# Patient Record
Sex: Female | Born: 1965 | Race: White | Hispanic: No | Marital: Married | Smoking: Former smoker
Health system: Southern US, Community
[De-identification: ages and names within clinical notes are randomized; demographics above are authoritative.]

## PROBLEM LIST (undated history)

## (undated) DIAGNOSIS — Z87898 Personal history of other specified conditions: Secondary | ICD-10-CM

## (undated) DIAGNOSIS — I700 Atherosclerosis of aorta: Secondary | ICD-10-CM

## (undated) DIAGNOSIS — K760 Fatty (change of) liver, not elsewhere classified: Secondary | ICD-10-CM

## (undated) DIAGNOSIS — K5732 Diverticulitis of large intestine without perforation or abscess without bleeding: Secondary | ICD-10-CM

## (undated) DIAGNOSIS — K5750 Diverticulosis of both small and large intestine without perforation or abscess without bleeding: Secondary | ICD-10-CM

## (undated) DIAGNOSIS — K5010 Crohn's disease of large intestine without complications: Secondary | ICD-10-CM

## (undated) DIAGNOSIS — N200 Calculus of kidney: Secondary | ICD-10-CM

## (undated) DIAGNOSIS — K639 Disease of intestine, unspecified: Secondary | ICD-10-CM

## (undated) DIAGNOSIS — K222 Esophageal obstruction: Secondary | ICD-10-CM

## (undated) DIAGNOSIS — I2510 Atherosclerotic heart disease of native coronary artery without angina pectoris: Secondary | ICD-10-CM

## (undated) DIAGNOSIS — E8809 Other disorders of plasma-protein metabolism, not elsewhere classified: Secondary | ICD-10-CM

## (undated) DIAGNOSIS — I10 Essential (primary) hypertension: Secondary | ICD-10-CM

## (undated) DIAGNOSIS — J90 Pleural effusion, not elsewhere classified: Secondary | ICD-10-CM

## (undated) HISTORY — PX: COLONOSCOPY: SHX174

## (undated) HISTORY — DX: Disease of intestine, unspecified: K63.9

## (undated) HISTORY — DX: Atherosclerosis of aorta: I70.0

## (undated) HISTORY — DX: Personal history of other specified conditions: Z87.898

## (undated) HISTORY — DX: Fatty (change of) liver, not elsewhere classified: K76.0

## (undated) HISTORY — DX: Esophageal obstruction: K22.2

## (undated) HISTORY — DX: Crohn's disease of large intestine without complications: K50.10

## (undated) HISTORY — DX: Other disorders of plasma-protein metabolism, not elsewhere classified: E88.09

## (undated) HISTORY — DX: Atherosclerotic heart disease of native coronary artery without angina pectoris: I25.10

## (undated) HISTORY — DX: Calculus of kidney: N20.0

## (undated) HISTORY — DX: Pleural effusion, not elsewhere classified: J90

---

## 1997-07-12 ENCOUNTER — Other Ambulatory Visit: Admission: RE | Admit: 1997-07-12 | Discharge: 1997-07-12 | Payer: Self-pay | Admitting: *Deleted

## 1997-08-04 ENCOUNTER — Other Ambulatory Visit: Admission: RE | Admit: 1997-08-04 | Discharge: 1997-08-04 | Payer: Self-pay | Admitting: *Deleted

## 2000-03-01 ENCOUNTER — Other Ambulatory Visit: Admission: RE | Admit: 2000-03-01 | Discharge: 2000-03-01 | Payer: Self-pay | Admitting: *Deleted

## 2000-04-02 HISTORY — PX: HEMORRHOID SURGERY: SHX153

## 2002-05-08 ENCOUNTER — Emergency Department (HOSPITAL_COMMUNITY): Admission: EM | Admit: 2002-05-08 | Discharge: 2002-05-08 | Payer: Self-pay | Admitting: Emergency Medicine

## 2002-06-25 ENCOUNTER — Other Ambulatory Visit: Admission: RE | Admit: 2002-06-25 | Discharge: 2002-06-25 | Payer: Self-pay | Admitting: *Deleted

## 2004-01-21 ENCOUNTER — Other Ambulatory Visit: Admission: RE | Admit: 2004-01-21 | Discharge: 2004-01-21 | Payer: Self-pay | Admitting: Obstetrics and Gynecology

## 2005-01-29 ENCOUNTER — Ambulatory Visit (HOSPITAL_BASED_OUTPATIENT_CLINIC_OR_DEPARTMENT_OTHER): Admission: RE | Admit: 2005-01-29 | Discharge: 2005-01-29 | Payer: Self-pay

## 2005-01-29 ENCOUNTER — Ambulatory Visit (HOSPITAL_COMMUNITY): Admission: RE | Admit: 2005-01-29 | Discharge: 2005-01-29 | Payer: Self-pay

## 2005-05-02 ENCOUNTER — Other Ambulatory Visit: Admission: RE | Admit: 2005-05-02 | Discharge: 2005-05-02 | Payer: Self-pay | Admitting: Obstetrics and Gynecology

## 2006-09-09 ENCOUNTER — Encounter: Admission: RE | Admit: 2006-09-09 | Discharge: 2006-09-09 | Payer: Self-pay | Admitting: Obstetrics and Gynecology

## 2010-04-23 ENCOUNTER — Encounter: Payer: Self-pay | Admitting: Obstetrics and Gynecology

## 2010-07-13 ENCOUNTER — Other Ambulatory Visit (HOSPITAL_COMMUNITY): Payer: Self-pay | Admitting: Obstetrics and Gynecology

## 2010-07-14 ENCOUNTER — Other Ambulatory Visit (HOSPITAL_COMMUNITY): Payer: Self-pay | Admitting: Obstetrics and Gynecology

## 2010-07-14 DIAGNOSIS — R1032 Left lower quadrant pain: Secondary | ICD-10-CM

## 2010-07-19 ENCOUNTER — Ambulatory Visit (HOSPITAL_COMMUNITY)
Admission: RE | Admit: 2010-07-19 | Discharge: 2010-07-19 | Disposition: A | Discharge status: Home / Self Care | Payer: Private Health Insurance - Indemnity | Source: Ambulatory Visit | Attending: Obstetrics and Gynecology | Admitting: Obstetrics and Gynecology

## 2010-07-19 DIAGNOSIS — K7689 Other specified diseases of liver: Secondary | ICD-10-CM | POA: Insufficient documentation

## 2010-07-19 DIAGNOSIS — K5732 Diverticulitis of large intestine without perforation or abscess without bleeding: Secondary | ICD-10-CM | POA: Insufficient documentation

## 2010-07-19 DIAGNOSIS — K630 Abscess of intestine: Secondary | ICD-10-CM | POA: Insufficient documentation

## 2010-07-19 DIAGNOSIS — R1032 Left lower quadrant pain: Secondary | ICD-10-CM | POA: Insufficient documentation

## 2010-07-19 DIAGNOSIS — K921 Melena: Secondary | ICD-10-CM | POA: Insufficient documentation

## 2010-07-19 MED ORDER — IOHEXOL 300 MG/ML  SOLN
100.0000 mL | Freq: Once | INTRAMUSCULAR | Status: AC | PRN
  Administered 2010-07-19: 100 mL via INTRAVENOUS

## 2010-07-25 ENCOUNTER — Other Ambulatory Visit (HOSPITAL_COMMUNITY): Payer: Self-pay | Admitting: Gastroenterology

## 2010-07-25 DIAGNOSIS — K5792 Diverticulitis of intestine, part unspecified, without perforation or abscess without bleeding: Secondary | ICD-10-CM

## 2010-07-31 ENCOUNTER — Ambulatory Visit (HOSPITAL_COMMUNITY)
Admission: RE | Admit: 2010-07-31 | Discharge: 2010-07-31 | Disposition: A | Discharge status: Home / Self Care | Payer: Private Health Insurance - Indemnity | Source: Ambulatory Visit | Attending: Gastroenterology | Admitting: Gastroenterology

## 2010-07-31 DIAGNOSIS — K639 Disease of intestine, unspecified: Secondary | ICD-10-CM | POA: Insufficient documentation

## 2010-07-31 DIAGNOSIS — K5792 Diverticulitis of intestine, part unspecified, without perforation or abscess without bleeding: Secondary | ICD-10-CM

## 2010-07-31 DIAGNOSIS — K5732 Diverticulitis of large intestine without perforation or abscess without bleeding: Secondary | ICD-10-CM | POA: Insufficient documentation

## 2010-07-31 MED ORDER — IOHEXOL 300 MG/ML  SOLN
100.0000 mL | Freq: Once | INTRAMUSCULAR | Status: AC | PRN
  Administered 2010-07-31: 100 mL via INTRAVENOUS

## 2010-08-18 NOTE — Op Note (Signed)
NAMELOYALTY, ARENTZ               ACCOUNT NO.:  [id number]   MEDICAL RECORD NO.:  [id number]          PATIENT TYPE:  AMB   LOCATION:  [facility]                         FACILITY:  [facility]   PHYSICIAN:  Georgina Quint, M.D.   DATE OF BIRTH:  1965/11/20   DATE OF PROCEDURE:  01/29/2005  DATE OF DISCHARGE:                                 OPERATIVE REPORT   PREOPERATIVE DIAGNOSIS:  Chronic draining abscess of the perineum.   POSTOPERATIVE DIAGNOSIS:  Anal fistula.   OPERATION:  Excision of anal fistula.   SURGEON:  Dr. Deon Pilling.   ANESTHESIA:  General and local.   PROCEDURE:  After the patient was monitored and had general anesthesia and  routine preparation and draping of the perineal area, I probed the area of  pus drainage in the right anterior aspect of the buttock.  Probe went in for  a couple of centimeters, but I could not get it to go down to the rectum or  anus at that point.  I cut out the skin scar in that area and saw a good  deal of chronic granulation tissue and felt that this most likely  represented an anal fistula.  I put in an anoscope and saw no definite  internal opening but gently probed in the direction of the anus with a  fistula probe and it did indeed go into a crypt at the upper end of the anus  on the right anterior aspect.  I could only traverse a small amount of  external sphincter, so I felt the best method would be open treatment.  I  completely opened the fistulous tract using cautery and trimmed away  overhanging skin so as to provide good open drainage.  I noted a good rim of  persisting external sphincter above the muscle all the way around.  I then  cauterized the area thoroughly and scraped away the chronic granulation  tissue and thoroughly anesthetized the site with long-acting local  anesthetic.  I applied a packing and a small bandage.  The patient tolerated  the operation well.      Georgina Quint, M.D.  Electronically Signed     WB/MEDQ  D:   01/29/2005  T:  01/29/2005  Job:  [id number]

## 2011-06-28 ENCOUNTER — Encounter (HOSPITAL_COMMUNITY): Payer: Self-pay | Admitting: Emergency Medicine

## 2011-06-28 ENCOUNTER — Emergency Department (HOSPITAL_COMMUNITY)
Admission: EM | Admit: 2011-06-28 | Discharge: 2011-06-28 | Disposition: A | Discharge status: Home Health | Payer: Private Health Insurance - Indemnity | Attending: Emergency Medicine | Admitting: Emergency Medicine

## 2011-06-28 ENCOUNTER — Emergency Department (HOSPITAL_COMMUNITY): Payer: Private Health Insurance - Indemnity

## 2011-06-28 DIAGNOSIS — M25569 Pain in unspecified knee: Secondary | ICD-10-CM | POA: Insufficient documentation

## 2011-06-28 DIAGNOSIS — M25469 Effusion, unspecified knee: Secondary | ICD-10-CM | POA: Insufficient documentation

## 2011-06-28 MED ORDER — NAPROXEN 500 MG PO TABS: 500.0000 mg | ORAL_TABLET | Freq: Two times a day (BID) | ORAL | Status: DC

## 2011-06-28 MED ORDER — HYDROCODONE-ACETAMINOPHEN 5-325 MG PO TABS: ORAL_TABLET | ORAL | Status: DC

## 2011-06-28 MED ORDER — HYDROMORPHONE HCL PF 1 MG/ML IJ SOLN
1.0000 mg | Freq: Once | INTRAMUSCULAR | Status: AC
  Administered 2011-06-28: 1 mg via INTRAVENOUS

## 2011-06-28 MED ORDER — ONDANSETRON HCL 4 MG/2ML IJ SOLN
4.0000 mg | Freq: Once | INTRAMUSCULAR | Status: AC
  Administered 2011-06-28: 4 mg via INTRAVENOUS
  Filled 2011-06-28: qty 2

## 2011-06-28 MED ORDER — HYDROMORPHONE HCL PF 1 MG/ML IJ SOLN
0.5000 mg | Freq: Once | INTRAMUSCULAR | Status: AC
  Administered 2011-06-28: 0.5 mg via INTRAVENOUS
  Filled 2011-06-28 (×2): qty 1

## 2011-06-28 MED ORDER — TRAMADOL HCL 50 MG PO TABS: 50.0000 mg | ORAL_TABLET | Freq: Four times a day (QID) | ORAL | Status: AC | PRN

## 2011-06-28 NOTE — ED Provider Notes (Signed)
Patient cannot take Lortab because of itching. Hydrocodone prescription is canceled and she is given a prescription for tramadol.  Delora Fuel, MD 34/03/70 9643

## 2011-06-28 NOTE — ED Notes (Signed)
Pt discharged by T. Soyars, Therapist, sports

## 2011-06-28 NOTE — ED Provider Notes (Signed)
History     CSN: [id number]  Arrival date & time 06/28/11  1717   First MD Initiated Contact with Patient 06/28/11 1808      Chief Complaint  Patient presents with  . Knee Pain    (Consider location/radiation/quality/duration/timing/severity/associated sxs/prior treatment) HPI Comments: Patient presents with acute onset of right knee pain that began after she stood up after sitting up on the side of the bed. Pain was severe. She denied hearing any clicks or pops. She denies history of knee injuries. She denies falling. Patient is unable to ambulate. Pain is worse with bearing weight and palpation and bending of her knee. No treatments prior to arrival. Nothing makes the pain better.  Patient is a 46 y.o. female presenting with knee pain. The history is provided by the patient.  Knee Pain This is a new problem. The current episode started today. The problem has been unchanged. Associated symptoms include arthralgias. Pertinent negatives include no fever, joint swelling, nausea, neck pain, numbness, vomiting or weakness. The symptoms are aggravated by bending and walking. She has tried nothing for the symptoms.    History reviewed. No pertinent past medical history.  History reviewed. No pertinent past surgical history.  No family history on file.  History  Substance Use Topics  . Smoking status: Former Research scientist (life sciences)  . Smokeless tobacco: Former Systems developer    Quit date: 11/28/2010  . Alcohol Use: No    OB History    Grav Para Term Preterm Abortions TAB SAB Ect Mult Living                  Review of Systems  Constitutional: Negative for fever and activity change.  HENT: Negative for neck pain.   Gastrointestinal: Negative for nausea and vomiting.  Musculoskeletal: Positive for arthralgias. Negative for back pain and joint swelling.  Skin: Negative for wound.  Neurological: Negative for weakness and numbness.    Allergies  Review of patient's allergies indicates no known  allergies.  Home Medications  No current outpatient prescriptions on file.  BP 149/74  Pulse 104  Temp(Src) 98.2 F (36.8 C) (Oral)  Resp 20  Ht 5' 4"  (1.626 m)  Wt 128 lb (58.06 kg)  BMI 21.97 kg/m2  SpO2 100%  Physical Exam  Nursing note and vitals reviewed. Constitutional: She is oriented to person, place, and time. She appears well-developed and well-nourished.  HENT:  Head: Normocephalic and atraumatic.  Eyes: Pupils are equal, round, and reactive to light.  Neck: Normal range of motion. Neck supple.  Cardiovascular: Exam reveals no decreased pulses.   Pulses:      Dorsalis pedis pulses are 2+ on the right side, and 2+ on the left side.       Posterior tibial pulses are 2+ on the right side, and 2+ on the left side.  Musculoskeletal: She exhibits tenderness. She exhibits no edema.       Right hip: Normal.       Right knee: She exhibits swelling. She exhibits normal range of motion and no bony tenderness. tenderness found. Lateral joint line tenderness noted. No medial joint line, no MCL and no LCL tenderness noted.       Right ankle: No proximal fibula tenderness found.       Right upper leg: Normal.       Right lower leg: Normal.  Neurological: She is alert and oriented to person, place, and time. No sensory deficit.       Motor, sensation, and vascular distal  to the injury is fully intact.   Skin: Skin is warm and dry.  Psychiatric: She has a normal mood and affect.    ED Course  Procedures (including critical care time)  Labs Reviewed - No data to display Dg Knee Complete 4 Views Right  06/28/2011  *RADIOLOGY REPORT*  Clinical Data: 46 year old female with posterior right knee pain. No known injury.  RIGHT KNEE - COMPLETE 4+ VIEW  Comparison: None  Findings: No evidence of acute fracture, subluxation or dislocation identified.  No joint effusion noted.  No radio-opaque foreign bodies are present.  No focal bony lesions are noted.  The joint spaces are unremarkable.   Minimal soft tissue prominence overlying the popliteal fossa on the lateral view.  IMPRESSION: No evidence of acute abnormality.  Minimal soft tissue prominence overlying the popliteal fossa.  This may represent normal overlying soft tissue structures but given this patient's symptoms, a popliteal/Baker's cyst is not excluded.  Original Report Authenticated By: Lura Em, M.D.   1. Knee pain    6:35 PM Patient seen and examined. X-ray ordered. Pain medication ordered.   Vital signs reviewed and are as follows: Filed Vitals:   06/28/11 1719  BP: 149/74  Pulse: 104  Temp: 98.2 F (36.8 C)  Resp: 20   7:50 PM X-ray reviewed by myself. Patient informed of results. Patient's pain is improved after IV pain medicines. Discussed the need to rest and use the RICE protocol. Crutches and knee immobilizer given by orthopedic technician. Patient urged to followup with orthopedic doctor for further evaluation of her injury. Referral given. Will discharge to home with pain medication and anti-inflammatories.  Patient counseled on use of narcotic pain medications. Counseled not to combine these medications with others containing tylenol. Urged not to drink alcohol, drive, or perform any other activities that requires focus while taking these medications. The patient verbalizes understanding and agrees with the plan.    MDM  Knee pain after injury. Likely ligamentous injury, less likely mensicus injury given mechanism. Pt will need ortho eval.          Carlisle Cater, PA 06/28/11 1953

## 2011-06-28 NOTE — ED Notes (Signed)
Per EMS< pt from home, reports R knee pain, states she stood up and turned her knee the wrong way

## 2011-06-28 NOTE — ED Notes (Signed)
FRT:MY11<ZN> Expected date:06/28/11<BR> Expected time: 4:57 PM<BR> Means of arrival:Ambulance<BR> Comments:<BR> M32. 46 YO F. KNEE PAION. 20 MINS. STABLE, AMBULATORY,  ?TRIAGE

## 2011-06-28 NOTE — ED Notes (Signed)
Patient transported to X-ray 

## 2011-06-28 NOTE — ED Provider Notes (Signed)
Medical screening examination/treatment/procedure(s) were performed by non-physician practitioner and as supervising physician I was immediately available for consultation/collaboration.   Lezlie Octave, MD 06/28/11 2350

## 2011-06-28 NOTE — Discharge Instructions (Signed)
Please read and follow all provided instructions.  Your diagnoses today include:  1. Knee pain    Tests performed today include:  X-ray of your knee which did not show any fractures or dislocations  Vital signs. See below for your results today.   Medications prescribed:   Vicodin (hydrocodone/acetaminophen) - narcotic pain medication  You have been prescribed narcotic pain medication such as Vicodin or Percocet: DO NOT drive or perform any activities that require you to be awake and alert because this medicine can make you drowsy. BE VERY CAREFUL not to take multiple medicines containing Tylenol (also called acetaminophen). Doing so can lead to an overdose which can damage your liver and cause liver failure and possibly death.    Ibuprofen - anti-inflammatory pain medication  Do not exceed 849m ibuprofen every 8 hours  Naproxen - anti-inflammatory pain medication  Do not exceed 5073mnaproxen every 12 hours  You have been prescribed an anti-inflammatory medication or NSAID. Take with food. Take smallest effective dose for the shortest duration needed for your pain. Stop taking if you experience stomach pain or vomiting.   Take any prescribed medications only as directed.  Home care instructions:  Follow any educational materials contained in this packet.  Use crutches as knee immobilizer as needed for comfort.   BE VERY CAREFUL not to take multiple medicines containing Tylenol (also called acetaminophen). Doing so can lead to an overdose which can damage your liver and cause liver failure and possibly death.   Follow-up instructions: Please follow-up with the orthopedic referral in the next week for further evaluation of your symptoms.   If you do not have a primary care doctor -- see below for referral information.   Return instructions:   Please return to the Emergency Department if you experience worsening symptoms.   Please return if you have any other emergent  concerns.  Additional Information:  Your vital signs today were: BP 149/74  Pulse 104  Temp(Src) 98.2 F (36.8 C) (Oral)  Resp 20  Ht 5' 4"  (1.626 m)  Wt 128 lb (58.06 kg)  BMI 21.97 kg/m2  SpO2 100%  LMP 06/19/2011 If your blood pressure (BP) was elevated above 135/85 this visit, please have this repeated by your doctor within one month. -------------- No Primary Care Doctor Call [facility]  [phone] agencies that provide inexpensive medical care    [facility]  [phone]  [facility]  [phone]  [facility]  37[phone]------------- RESOURCE GUIDE:  Dental Problems  Patients with Medicaid: [facility] [phone]. [place]:  [phone]                                                 Phone:  [phone] unable to  pay or uninsured, contact:  [place] or [facility]. to become qualified for the adult dental clinic.  Chronic Pain Problems Contact [facility]  [phone] Patients need to be referred by their primary care doctor.  Insufficient Money for Medicine Contact [employer]:  call "211" or Edith Endave [phone].  [place]  [phone] [facility]  [place]   [phone] (emergency services [phone])  Substance Abuse Resources [facility]  [phone] [facility] [phone] [facility] [place] [phone] Chinita Pester [phone] Residential & Outpatient Substance Abuse Program  [phone]  Abuse/Neglect [place] [phone] [place] [phone] (After Hours)  Emergency  [place] [phone]  [place] [facility] [phone] [place] [phone]  [facility] Dept. [address]      [place] Phone:  [phone]                                   Phone:  [phone]                 Phone:  [place] Phone:  [place] [phone] (After Hours)

## 2011-08-14 ENCOUNTER — Other Ambulatory Visit: Payer: Self-pay | Admitting: Obstetrics and Gynecology

## 2011-08-14 DIAGNOSIS — R928 Other abnormal and inconclusive findings on diagnostic imaging of breast: Secondary | ICD-10-CM

## 2011-08-16 ENCOUNTER — Ambulatory Visit
Admission: RE | Admit: 2011-08-16 | Discharge: 2011-08-16 | Disposition: A | Discharge status: Home / Self Care | Payer: Self-pay | Source: Ambulatory Visit | Attending: Obstetrics and Gynecology | Admitting: Obstetrics and Gynecology

## 2011-08-16 DIAGNOSIS — R928 Other abnormal and inconclusive findings on diagnostic imaging of breast: Secondary | ICD-10-CM

## 2011-10-25 ENCOUNTER — Other Ambulatory Visit: Payer: Self-pay | Admitting: Family Medicine

## 2011-10-25 ENCOUNTER — Inpatient Hospital Stay (HOSPITAL_COMMUNITY)
Admission: EM | Admit: 2011-10-25 | Discharge: 2011-10-30 | DRG: 392 | Disposition: A | Discharge status: Home / Self Care | Payer: 59 | Attending: General Surgery | Admitting: General Surgery

## 2011-10-25 ENCOUNTER — Other Ambulatory Visit: Payer: Self-pay

## 2011-10-25 ENCOUNTER — Encounter (HOSPITAL_COMMUNITY): Payer: Self-pay | Admitting: Emergency Medicine

## 2011-10-25 ENCOUNTER — Ambulatory Visit
Admission: RE | Admit: 2011-10-25 | Discharge: 2011-10-25 | Disposition: A | Discharge status: Home / Self Care | Payer: 59 | Source: Ambulatory Visit | Attending: Family Medicine | Admitting: Family Medicine

## 2011-10-25 DIAGNOSIS — K5732 Diverticulitis of large intestine without perforation or abscess without bleeding: Principal | ICD-10-CM | POA: Diagnosis present

## 2011-10-25 DIAGNOSIS — R109 Unspecified abdominal pain: Secondary | ICD-10-CM

## 2011-10-25 DIAGNOSIS — K630 Abscess of intestine: Secondary | ICD-10-CM

## 2011-10-25 LAB — URINALYSIS, ROUTINE W REFLEX MICROSCOPIC
Bilirubin Urine: NEGATIVE
Glucose, UA: NEGATIVE mg/dL
Ketones, ur: 15 mg/dL — AB
Leukocytes, UA: NEGATIVE
Nitrite: NEGATIVE
Protein, ur: NEGATIVE mg/dL
Specific Gravity, Urine: <1.005 — ABNORMAL LOW (ref 1.005–1.030)
Urobilinogen, UA: 0.2 mg/dL (ref 0.0–1.0)
pH: 6 (ref 5.0–8.0)

## 2011-10-25 LAB — COMPREHENSIVE METABOLIC PANEL
Albumin: 3.5 g/dL (ref 3.5–5.2)
Alkaline Phosphatase: 83 U/L (ref 39–117)
BUN: <3 mg/dL — ABNORMAL LOW (ref 6–23)
Potassium: 3.4 mEq/L — ABNORMAL LOW (ref 3.5–5.1)
Total Protein: 7.7 g/dL (ref 6.0–8.3)

## 2011-10-25 LAB — CBC WITH DIFFERENTIAL/PLATELET
Basophils Absolute: 0.1 K/uL (ref 0.0–0.1)
Basophils Relative: 1 % (ref 0–1)
Eosinophils Absolute: 0.2 10*3/uL (ref 0.0–0.7)
Eosinophils Relative: 2 % (ref 0–5)
HCT: 44.8 % (ref 36.0–46.0)
Hemoglobin: 15.7 g/dL — ABNORMAL HIGH (ref 12.0–15.0)
Lymphocytes Relative: 17 % (ref 12–46)
Lymphs Abs: 2.1 K/uL (ref 0.7–4.0)
MCH: 35.1 pg — ABNORMAL HIGH (ref 26.0–34.0)
MCHC: 35 g/dL (ref 30.0–36.0)
MCV: 100.2 fL — ABNORMAL HIGH (ref 78.0–100.0)
Monocytes Absolute: 0.6 K/uL (ref 0.1–1.0)
Monocytes Relative: 5 % (ref 3–12)
Neutro Abs: 9.3 K/uL — ABNORMAL HIGH (ref 1.7–7.7)
Neutrophils Relative %: 75 % (ref 43–77)
Platelets: 494 10*3/uL — ABNORMAL HIGH (ref 150–400)
RBC: 4.47 MIL/uL (ref 3.87–5.11)
RDW: 14.1 % (ref 11.5–15.5)
WBC: 12.3 K/uL — ABNORMAL HIGH (ref 4.0–10.5)

## 2011-10-25 LAB — URINE MICROSCOPIC-ADD ON

## 2011-10-25 LAB — PROTIME-INR
INR: 1.13 (ref 0.00–1.49)
Prothrombin Time: 14.7 seconds (ref 11.6–15.2)

## 2011-10-25 LAB — COMPREHENSIVE METABOLIC PANEL WITH GFR
ALT: 10 U/L (ref 0–35)
AST: 15 U/L (ref 0–37)
CO2: 24 meq/L (ref 19–32)
Calcium: 9.4 mg/dL (ref 8.4–10.5)
Chloride: 96 meq/L (ref 96–112)
Creatinine, Ser: 0.45 mg/dL — ABNORMAL LOW (ref 0.50–1.10)
GFR calc Af Amer: >90 mL/min (ref >90)
GFR calc non Af Amer: >90 mL/min (ref >90)
Glucose, Bld: 113 mg/dL — ABNORMAL HIGH (ref 70–99)
Sodium: 133 meq/L — ABNORMAL LOW (ref 135–145)
Total Bilirubin: 0.3 mg/dL (ref 0.3–1.2)

## 2011-10-25 LAB — APTT: aPTT: 30 seconds (ref 24–37)

## 2011-10-25 MED ORDER — METRONIDAZOLE IN NACL 5-0.79 MG/ML-% IV SOLN
500.0000 mg | Freq: Once | INTRAVENOUS | Status: AC
  Administered 2011-10-25: 500 mg via INTRAVENOUS
  Filled 2011-10-25: qty 100

## 2011-10-25 MED ORDER — MORPHINE SULFATE 4 MG/ML IJ SOLN
4.0000 mg | Freq: Once | INTRAMUSCULAR | Status: AC
  Administered 2011-10-25: 4 mg via INTRAVENOUS
  Filled 2011-10-25: qty 1

## 2011-10-25 MED ORDER — HYDROMORPHONE HCL PF 1 MG/ML IJ SOLN
1.0000 mg | INTRAMUSCULAR | Status: DC | PRN
  Filled 2011-10-25: qty 1

## 2011-10-25 MED ORDER — SODIUM CHLORIDE 0.9 % IV SOLN
1.0000 g | INTRAVENOUS | Status: DC
  Administered 2011-10-26 – 2011-10-29 (×4): 1 g via INTRAVENOUS
  Filled 2011-10-25 (×6): qty 1

## 2011-10-25 MED ORDER — PANTOPRAZOLE SODIUM 40 MG IV SOLR
40.0000 mg | Freq: Every day | INTRAVENOUS | Status: DC
  Administered 2011-10-25 – 2011-10-29 (×5): 40 mg via INTRAVENOUS
  Filled 2011-10-25 (×6): qty 40

## 2011-10-25 MED ORDER — CIPROFLOXACIN IN D5W 400 MG/200ML IV SOLN
400.0000 mg | Freq: Once | INTRAVENOUS | Status: AC
  Administered 2011-10-25: 400 mg via INTRAVENOUS
  Filled 2011-10-25: qty 200

## 2011-10-25 MED ORDER — SODIUM CHLORIDE 0.9 % IV SOLN
INTRAVENOUS | Status: DC
  Administered 2011-10-25: 1000 mL via INTRAVENOUS

## 2011-10-25 MED ORDER — CHLORHEXIDINE GLUCONATE 0.12 % MT SOLN
15.0000 mL | Freq: Two times a day (BID) | OROMUCOSAL | Status: DC
  Administered 2011-10-25 – 2011-10-30 (×9): 15 mL via OROMUCOSAL
  Filled 2011-10-25 (×9): qty 15

## 2011-10-25 MED ORDER — KCL IN DEXTROSE-NACL 20-5-0.9 MEQ/L-%-% IV SOLN
INTRAVENOUS | Status: DC
  Administered 2011-10-26: 100 mL/h via INTRAVENOUS
  Administered 2011-10-26 – 2011-10-29 (×5): via INTRAVENOUS
  Filled 2011-10-25 (×14): qty 1000

## 2011-10-25 MED ORDER — BIOTENE DRY MOUTH MT LIQD
15.0000 mL | Freq: Two times a day (BID) | OROMUCOSAL | Status: DC
  Administered 2011-10-26 – 2011-10-29 (×8): 15 mL via OROMUCOSAL

## 2011-10-25 MED ORDER — IOHEXOL 300 MG/ML  SOLN: 100.0000 mL | Freq: Once | INTRAMUSCULAR | Status: DC | PRN

## 2011-10-25 MED ORDER — ONDANSETRON HCL 4 MG/2ML IJ SOLN: 4.0000 mg | Freq: Four times a day (QID) | INTRAMUSCULAR | Status: DC | PRN

## 2011-10-25 MED ORDER — OXYCODONE HCL 5 MG PO TABS
5.0000 mg | ORAL_TABLET | ORAL | Status: DC | PRN
  Administered 2011-10-25 – 2011-10-30 (×14): 5 mg via ORAL
  Filled 2011-10-25 (×14): qty 1

## 2011-10-25 NOTE — ED Provider Notes (Signed)
History     CSN: [id number]  Arrival date & time 10/25/11  1427   None     Chief Complaint  Patient presents with  . other     Abcess in abdomen    (Consider location/radiation/quality/duration/timing/severity/associated sxs/prior treatment) HPI Comments: Patient presents directly from [place] after having an abdominal CT which revealed a sigmoid diverticular abscess. The patient has history of diverticulitis and sees Dr. Amedeo Plenty as her GI doc. She reports having lower abdominal pain, mostly on the left, that does not radiates. It started about a month ago that has been constant and getting progressively worse. She describes the pain as dull and achy. She saw Dr. Amedeo Plenty after having a "fever" of 100.4 6 days ago. He prescribed Flagyl and Cipro, which the patient finished yesterday. She went to outpatient imaging for a abdominal CT due to persistent pain, which revealed a diverticular abscess and the patient was instructed to come straight to the ED. She denies current fever, chest pain, SOB.   History reviewed. No pertinent past medical history.  History reviewed. No pertinent past surgical history.  History reviewed. No pertinent family history.  History  Substance Use Topics  . Smoking status: Former Research scientist (life sciences)  . Smokeless tobacco: Former Systems developer    Quit date: 11/28/2010  . Alcohol Use: No    OB History    Grav Para Term Preterm Abortions TAB SAB Ect Mult Living                  Review of Systems  Constitutional: Positive for fever and appetite change. Negative for chills and diaphoresis.  Respiratory: Negative for cough, chest tightness and shortness of breath.   Cardiovascular: Negative for chest pain.  Gastrointestinal: Positive for nausea, vomiting, abdominal pain and diarrhea. Negative for constipation.  Genitourinary: Negative for dysuria.  Musculoskeletal: Positive for back pain.  Skin: Negative for wound.  Neurological: Negative for dizziness,  light-headedness and headaches.    Allergies  Review of patient's allergies indicates no known allergies.  Home Medications   Current Outpatient Rx  Name Route Sig Dispense Refill  . VITAMIN D 1000 UNITS PO TABS Oral Take 1,000 Units by mouth daily.      BP 112/79  Pulse 128  Temp 98.3 F (36.8 C)  Resp 20  SpO2 99%  LMP 10/18/2011  Physical Exam  Nursing note and vitals reviewed. Constitutional: She appears well-developed and well-nourished. No distress.  HENT:  Head: Normocephalic and atraumatic.  Eyes: Conjunctivae are normal. No scleral icterus.  Neck: Normal range of motion.  Cardiovascular: Normal rate, regular rhythm and intact distal pulses.  Exam reveals no gallop and no friction rub.   No murmur heard. Pulmonary/Chest: Effort normal and breath sounds normal. No respiratory distress. She has no wheezes. She has no rales. She exhibits no tenderness.  Abdominal: Soft. She exhibits no distension. There is tenderness. There is no rebound and no guarding.       Patient endorses tenderness and pain to palpation of LLQ.   Musculoskeletal: Normal range of motion.  Neurological: She is alert.  Skin: Skin is warm and dry. She is not diaphoretic.  Psychiatric: She has a normal mood and affect. Her behavior is normal.    ED Course  Procedures (including critical care time)  Labs Reviewed  CBC WITH DIFFERENTIAL - Abnormal; Notable for the following:    WBC 12.3 (*)     Hemoglobin 15.7 (*)     MCV 100.2 (*)  MCH 35.1 (*)     Platelets 494 (*)     Neutro Abs 9.3 (*)     All other components within normal limits  COMPREHENSIVE METABOLIC PANEL - Abnormal; Notable for the following:    Sodium 133 (*)     Potassium 3.4 (*)     Glucose, Bld 113 (*)     BUN <3 (*)  REPEATED TO VERIFY   Creatinine, Ser 0.45 (*)     All other components within normal limits  URINALYSIS, ROUTINE W REFLEX MICROSCOPIC - Abnormal; Notable for the following:    Specific Gravity, Urine  <1.005 (*)     Hgb urine dipstick TRACE (*)     Ketones, ur 15 (*)     All other components within normal limits  URINE MICROSCOPIC-ADD ON  APTT  PROTIME-INR  COMPREHENSIVE METABOLIC PANEL  CBC  APTT   Ct Abdomen Pelvis W Contrast  10/25/2011  *RADIOLOGY REPORT*  Clinical Data: History of diverticulitis, left abdominal pain  CT ABDOMEN AND PELVIS WITH CONTRAST  Technique:  Multidetector CT imaging of the abdomen and pelvis was performed following the standard protocol during bolus administration of intravenous contrast.  Contrast:  100 ml Omni 300  Comparison: July 31, 2010  Findings: The lung bases are clear.  Several tiny hepatic cysts are stable.  The gallbladder, spleen, pancreas, adrenal glands, kidneys, urinary bladder, uterus, osseous structures have a normal appearance.  Again noted is sigmoid diverticulitis with prominent bowel wall thickening, pericolonic stranding, and a small amount of adjacent free fluid. The length of diseased colon measures approximately 10 cm.  There is also a gas, fluid, and soft tissue collection superior to the inflamed loop of sigmoid colon which measures 6 cm, consistent with perforation and extraluminal abscess. No free pneumoperitoneum is identified.  Contrast is present at the rectum, and there is no evidence of obstruction.  IMPRESSION: Sigmoid diverticulitis with adjacent abscess.  Findings were discussed with Dr. Cheron Schaumann at the time of the exam.  Original Report Authenticated By: Duayne Cal, M.D.     1. Intestinal diverticular abscess       MDM  3:26 PM Patient referred from [facility] outpatient imaging for diverticular abscess. IV fluids, morphine, flagyl, and cipro started. Will consult general surgery.   4:12 PM Abdominal CT shows 6cm sigmoid diverticular abscess with perforation. No peritoneal signs expressed by patient.  Filed Vitals:   10/25/11 1438  BP: 112/79  Pulse: 128  Temp: 98.3 F (36.8 C)  Resp: 20   4:30 PM Patient  resting comfortably. Husband and son at bedside. Potential plan of surgery discussed with patient and family and we will keep them updated.   5:31 PM General surgery spoke with the patient about potential plan. She will be admitted to the hospital for IV antibiotics and reevaluation for surgery. Plan discussed with Dr. Dorna Mai and the patient who is agreeable. She will be given more morphine to keep her comfortable.        Alvina Chou, [place] 10/25/11 1927

## 2011-10-25 NOTE — ED Notes (Signed)
Pt sent by PMD for further eval of abcess in abdomen area. Pt c/o left sided abdominal pain and nausea.

## 2011-10-25 NOTE — ED Provider Notes (Addendum)
Medical screening examination/treatment/procedure(s) were conducted as a shared visit with non-physician practitioner(s) and myself.  I personally evaluated the patient during the encounter   Pt with left side guarding mild tenderness, not septic appearing.  6 cm diverticular abscess on CT scan.  I spoke to Dr. Redmond Pulling who will let Dr. Brantley Stage know of patient in the ED and need for surgical evaluation.  Pt is tachycardic, will continue IVF's, treat pain, keep NPO.  Informed pt and family.    Saddie Benders. Shriya Aker, MD 10/25/11 1635   ECG at time 17:18 shows NSR at rate 85, normal axis, normal intervals, no ST or T wave abn's.  No priors.    Saddie Benders. Kamdon Reisig, MD 10/25/11 1725

## 2011-10-25 NOTE — H&P (Signed)
Natasha Barrera is an 46 y.o. female.   Chief Complaint: abdominal pain HPI: asked to see the patient at the request of Dr. Dorna Mai do to abdominal pain. It started 24 hours ago. It is dull and achy in nature. Location is left lower quadrant of her abdomen. There is no radiation. Denies vomiting or diarrhea. There is no blood in her stool. She has a history of diverticulitis from last year and is followed by  Gastroenterology. She saw her primary care doctor today and a CT scan was ordered. This showed sigmoid diverticulitis with questionable abscess. No free air and minimal free intra-abdominal fluid noted. She has no other medical problems.  History reviewed. No pertinent past medical history.  History reviewed. No pertinent past surgical history.  History reviewed. No pertinent family history. Social History:  reports that she has quit smoking. She quit smokeless tobacco use about 10 months ago. She reports that she does not drink alcohol or use illicit drugs.  Allergies: No Known Allergies   (Not in a hospital admission)  Results for orders placed during the hospital encounter of 10/25/11 (from the past 48 hour(s))  URINALYSIS, ROUTINE W REFLEX MICROSCOPIC     Status: Abnormal   Collection Time   10/25/11  2:44 PM      Component Value Range Comment   Color, Urine YELLOW  YELLOW    APPearance CLEAR  CLEAR    Specific Gravity, Urine <1.005 (*) 1.005 - 1.030    pH 6.0  5.0 - 8.0    Glucose, UA NEGATIVE  NEGATIVE mg/dL    Hgb urine dipstick TRACE (*) NEGATIVE    Bilirubin Urine NEGATIVE  NEGATIVE    Ketones, ur 15 (*) NEGATIVE mg/dL    Protein, ur NEGATIVE  NEGATIVE mg/dL    Urobilinogen, UA 0.2  0.0 - 1.0 mg/dL    Nitrite NEGATIVE  NEGATIVE    Leukocytes, UA NEGATIVE  NEGATIVE   URINE MICROSCOPIC-ADD ON     Status: Normal   Collection Time   10/25/11  2:44 PM      Component Value Range Comment   Squamous Epithelial / LPF RARE  RARE    RBC / HPF 0-2  <3 RBC/hpf   CBC WITH  DIFFERENTIAL     Status: Abnormal   Collection Time   10/25/11  3:21 PM      Component Value Range Comment   WBC 12.3 (*) 4.0 - 10.5 K/uL    RBC 4.47  3.87 - 5.11 MIL/uL    Hemoglobin 15.7 (*) 12.0 - 15.0 g/dL    HCT 44.8  36.0 - 46.0 %    MCV 100.2 (*) 78.0 - 100.0 fL    MCH 35.1 (*) 26.0 - 34.0 pg    MCHC 35.0  30.0 - 36.0 g/dL    RDW 14.1  11.5 - 15.5 %    Platelets 494 (*) 150 - 400 K/uL    Neutrophils Relative 75  43 - 77 %    Neutro Abs 9.3 (*) 1.7 - 7.7 K/uL    Lymphocytes Relative 17  12 - 46 %    Lymphs Abs 2.1  0.7 - 4.0 K/uL    Monocytes Relative 5  3 - 12 %    Monocytes Absolute 0.6  0.1 - 1.0 K/uL    Eosinophils Relative 2  0 - 5 %    Eosinophils Absolute 0.2  0.0 - 0.7 K/uL    Basophils Relative 1  0 - 1 %  Basophils Absolute 0.1  0.0 - 0.1 K/uL   COMPREHENSIVE METABOLIC PANEL     Status: Abnormal   Collection Time   10/25/11  3:21 PM      Component Value Range Comment   Sodium 133 (*) 135 - 145 mEq/L    Potassium 3.4 (*) 3.5 - 5.1 mEq/L    Chloride 96  96 - 112 mEq/L    CO2 24  19 - 32 mEq/L    Glucose, Bld 113 (*) 70 - 99 mg/dL    BUN <3 (*) 6 - 23 mg/dL REPEATED TO VERIFY   Creatinine, Ser 0.45 (*) 0.50 - 1.10 mg/dL    Calcium 9.4  8.4 - 10.5 mg/dL    Total Protein 7.7  6.0 - 8.3 g/dL    Albumin 3.5  3.5 - 5.2 g/dL    AST 15  0 - 37 U/L    ALT 10  0 - 35 U/L    Alkaline Phosphatase 83  39 - 117 U/L    Total Bilirubin 0.3  0.3 - 1.2 mg/dL    GFR calc non Af Amer >90  >90 mL/min    GFR calc Af Amer >90  >90 mL/min    Ct Abdomen Pelvis W Contrast  10/25/2011  *RADIOLOGY REPORT*  Clinical Data: History of diverticulitis, left abdominal pain  CT ABDOMEN AND PELVIS WITH CONTRAST  Technique:  Multidetector CT imaging of the abdomen and pelvis was performed following the standard protocol during bolus administration of intravenous contrast.  Contrast:  100 ml Omni 300  Comparison: July 31, 2010  Findings: The lung bases are clear.  Several tiny hepatic cysts are  stable.  The gallbladder, spleen, pancreas, adrenal glands, kidneys, urinary bladder, uterus, osseous structures have a normal appearance.  Again noted is sigmoid diverticulitis with prominent bowel wall thickening, pericolonic stranding, and a small amount of adjacent free fluid. The length of diseased colon measures approximately 10 cm.  There is also a gas, fluid, and soft tissue collection superior to the inflamed loop of sigmoid colon which measures 6 cm, consistent with perforation and extraluminal abscess. No free pneumoperitoneum is identified.  Contrast is present at the rectum, and there is no evidence of obstruction.  IMPRESSION: Sigmoid diverticulitis with adjacent abscess.  Findings were discussed with Dr. Cheron Schaumann at the time of the exam.  Original Report Authenticated By: Duayne Cal, M.D.    Review of Systems  Constitutional: Positive for fever and chills.  Eyes: Negative.   Respiratory: Negative.   Cardiovascular: Negative.   Gastrointestinal: Positive for nausea and abdominal pain. Negative for blood in stool.  Genitourinary: Negative.   Musculoskeletal: Negative.   Skin: Negative.   Neurological: Negative.   Endo/Heme/Allergies: Negative.   Psychiatric/Behavioral: Negative.     Blood pressure 112/79, pulse 128, temperature 98.3 F (36.8 C), resp. rate 20, last menstrual period 10/18/2011, SpO2 99.00%. Physical Exam  Constitutional: She is oriented to person, place, and time. She appears well-developed and well-nourished.  HENT:  Head: Normocephalic and atraumatic.  Eyes: EOM are normal. Pupils are equal, round, and reactive to light.  Neck: Normal range of motion. Neck supple.  Cardiovascular: Normal rate and regular rhythm.   Respiratory: Effort normal and breath sounds normal.  GI:       Tender left lower quadrant. Slight mass noted left lower quadrant. No diffuse peritonitis.  Musculoskeletal: Normal range of motion.  Neurological: She is alert and oriented to  person, place, and time.  Skin: Skin is warm  and dry.  Psychiatric: She has a normal mood and affect. Her behavior is normal. Judgment and thought content normal.     Assessment/Plan Acute on chronic diverticulitis with phlegmon and abscess  Admit for IV fluids, IV antibiotics, n.p.o., and interventional radiology consultation for possible drainage. If not possible, may require surgical intervention. She would likely benefit from sigmoid colectomy at some point but this would be best done as an outpatient down the road once the inflammatory process has subsided. Discussed with the patient and her husband.  Vearl Aitken A. 10/25/2011, 5:07 PM

## 2011-10-26 LAB — CBC
HCT: 38.3 % (ref 36.0–46.0)
Hemoglobin: 12.9 g/dL (ref 12.0–15.0)
MCH: 33.9 pg (ref 26.0–34.0)
MCV: 100.8 fL — ABNORMAL HIGH (ref 78.0–100.0)
Platelets: 374 10*3/uL (ref 150–400)
RBC: 3.8 MIL/uL — ABNORMAL LOW (ref 3.87–5.11)
WBC: 7.3 10*3/uL (ref 4.0–10.5)

## 2011-10-26 LAB — COMPREHENSIVE METABOLIC PANEL
AST: 12 U/L (ref 0–37)
Albumin: 2.7 g/dL — ABNORMAL LOW (ref 3.5–5.2)
BUN: <3 mg/dL — ABNORMAL LOW (ref 6–23)
Calcium: 8.6 mg/dL (ref 8.4–10.5)
Chloride: 103 mEq/L (ref 96–112)
Creatinine, Ser: 0.51 mg/dL (ref 0.50–1.10)
Total Protein: 6.1 g/dL (ref 6.0–8.3)

## 2011-10-26 NOTE — Progress Notes (Signed)
Reviewed CT with Dr Anselm Pancoast.  Doesn't feel that there is a drainable collection and this is probably phlegmon.  Looks similar to CT 1 year ago.  Colonoscopy lat year normal .  Will keep on IV abx over the weekend to see if she improves.  She will need sigmoid colectomy at some point but current conditions would dictate a colostomy and she would like to avoid that. Hopefully this will cool down to where a 1 stage procedure could be done in 8 weeks or so.  If not she may need sigmoid colectomy next week.  Discussed with the patient.

## 2011-10-26 NOTE — Plan of Care (Signed)
Problem: Food- and Nutrition-Related Knowledge Deficit (NB-1.1) Goal: Nutrition education Formal process to instruct or train a patient/client in a skill or to impart knowledge to help patients/clients voluntarily manage or modify food choices and eating behavior to maintain or improve health.  Outcome: Completed/Met Date Met:  10/26/11 Patient with a 1 year history of diverticulosis. She had previously been told to follow a high fiber diet and avoid nuts and seeds. We discussed eating a low fiber diet until diverticulitis resolves, then gradually transition back to a high fiber diet. Educational handout was provided. I have answered all the patient's questions.

## 2011-10-26 NOTE — Progress Notes (Signed)
Patient ID: Natasha Barrera, female   DOB: 1966-01-16, 46 y.o.   MRN: [id number]    Subjective: Pt reports some abd pain in LLQ but not severe.  Denies n/v.  Thirsty.  Objective: Vital signs in last 24 hours: Temp:  [97.8 F (36.6 C)-98.5 F (36.9 C)] 97.9 F (36.6 C) (07/26 0540) Pulse Rate:  [78-128] 82  (07/26 0540) Resp:  [16-20] 16  (07/26 0540) BP: (82-112)/(48-79) 91/63 mmHg (07/26 0540) SpO2:  [96 %-99 %] 97 % (07/26 0540) Weight:  [128 lb (58.06 kg)] 128 lb (58.06 kg) (07/25 1857) Last BM Date: 10/26/11  Intake/Output from previous day: 07/25 0701 - 07/26 0700 In: 1138.8 [I.V.:1138.8] Out: 450 [Urine:450] Intake/Output this shift:    PE: Heart: RRR Lungs: CTA bilateral Abd: soft, except for tender mass in LLQ, +BS  Lab Results:   [facility] 10/26/11 0607 10/25/11 1521  WBC 7.3 12.3*  HGB 12.9 15.7*  HCT 38.3 44.8  PLT 374 494*   BMET  Basename 10/26/11 0607 10/25/11 1521  NA 138 133*  K 3.4* 3.4*  CL 103 96  CO2 24 24  GLUCOSE 99 113*  BUN <3* <3*  CREATININE 0.51 0.45*  CALCIUM 8.6 9.4   PT/INR  Basename 10/25/11 1638  LABPROT 14.7  INR 1.13   CMP     Component Value Date/Time   NA 138 10/26/2011 0607   K 3.4* 10/26/2011 0607   CL 103 10/26/2011 0607   CO2 24 10/26/2011 0607   GLUCOSE 99 10/26/2011 0607   BUN <3* 10/26/2011 0607   CREATININE 0.51 10/26/2011 0607   CALCIUM 8.6 10/26/2011 0607   PROT 6.1 10/26/2011 0607   ALBUMIN 2.7* 10/26/2011 0607   AST 12 10/26/2011 0607   ALT 7 10/26/2011 0607   ALKPHOS 62 10/26/2011 0607   BILITOT 0.2* 10/26/2011 0607   GFRNONAA >90 10/26/2011 0607   GFRAA >90 10/26/2011 0607   Lipase  No results found for this basename: lipase       Studies/Results: Ct Abdomen Pelvis W Contrast  10/25/2011  *RADIOLOGY REPORT*  Clinical Data: History of diverticulitis, left abdominal pain  CT ABDOMEN AND PELVIS WITH CONTRAST  Technique:  Multidetector CT imaging of the abdomen and pelvis was performed following the  standard protocol during bolus administration of intravenous contrast.  Contrast:  100 ml Omni 300  Comparison: July 31, 2010  Findings: The lung bases are clear.  Several tiny hepatic cysts are stable.  The gallbladder, spleen, pancreas, adrenal glands, kidneys, urinary bladder, uterus, osseous structures have a normal appearance.  Again noted is sigmoid diverticulitis with prominent bowel wall thickening, pericolonic stranding, and a small amount of adjacent free fluid. The length of diseased colon measures approximately 10 cm.  There is also a gas, fluid, and soft tissue collection superior to the inflamed loop of sigmoid colon which measures 6 cm, consistent with perforation and extraluminal abscess. No free pneumoperitoneum is identified.  Contrast is present at the rectum, and there is no evidence of obstruction.  IMPRESSION: Sigmoid diverticulitis with adjacent abscess.  Findings were discussed with Dr. Cheron Schaumann at the time of the exam.  Original Report Authenticated By: Duayne Cal, M.D.    Anti-infectives: Anti-infectives     Start     Dose/Rate Route Frequency Ordered Stop   10/25/11 1800   ertapenem (INVANZ) 1 g in sodium chloride 0.9 % 50 mL IVPB        1 g 100 mL/hr over 30 Minutes Intravenous Every 24 hours  10/25/11 1716     10/25/11 1530   ciprofloxacin (CIPRO) IVPB 400 mg        400 mg 200 mL/hr over 60 Minutes Intravenous  Once 10/25/11 1525 10/25/11 1842   10/25/11 1530   metroNIDAZOLE (FLAGYL) IVPB 500 mg        500 mg 100 mL/hr over 60 Minutes Intravenous  Once 10/25/11 1525 10/25/11 1716           Assessment/Plan  1.  Diverticulitis: no drainable abscess, needs IV abx for 3-4 days then home on augmentin for 10 days.  Would then plan colectomy with primary anastomosis in 2 months if no recurrence and reduce rsik of colostomy.  Will have nutrition see patient as well to go over diet.   LOS: 1 day    [place]igood, Meekah Math 10/26/2011

## 2011-10-27 NOTE — Progress Notes (Signed)
  Subjective: Feeling better. No pain today. Was a little uncomfortable last night. Tolerating clear liquids. Having loose stools. Voiding without any problem.  No fever or tachycardia. WBC normal yesterday.  Objective: Vital signs in last 24 hours: Temp:  [97.8 F (36.6 C)-98.6 F (37 C)] 97.8 F (36.6 C) (07/27 0647) Pulse Rate:  [71-84] 84  (07/27 0647) Resp:  [16-18] 18  (07/27 0647) BP: (95-100)/(54-66) 95/66 mmHg (07/27 0647) SpO2:  [97 %-100 %] 97 % (07/27 0647) Last BM Date: 10/26/11  Intake/Output from previous day: 07/26 0701 - 07/27 0700 In: 2914.5 [P.O.:600; I.V.:2314.5] Out: -  Intake/Output this shift:    General appearance: alert. In no distress. Pleasant. GI: abdomen soft. Nondistended. Tender fullness left lower quadrant. No peritoneal signs.  Lab Results:  No results found for this or any previous visit (from the past 24 hour(s)).   Studies/Results: @RISRSLT24 @     . antiseptic oral rinse  15 mL Mouth Rinse q12n4p  . chlorhexidine  15 mL Mouth Rinse BID  . ertapenem (INVANZ) IV  1 g Intravenous Q24H  . pantoprazole (PROTONIX) IV  40 mg Intravenous QHS     Assessment/Plan: Recurrent sigmoid diverticulitis with phlegmon, but no drainable abscess. Responding clinically to antibiotics. Will advance diet slowly, full liquids today. Continue antibiotics. Mobilize more.  Hopefully can be discharged home sometime next week with elective one stage sigmoid colectomy in 8 weeks or so.    LOS: 2 days    Deunte Bledsoe M. Dalbert Batman, M.D., [facility] General and Minimally invasive Surgery Breast and Colorectal Surgery Office:   [phone] Pager:   [phone]  10/27/2011  . .prob

## 2011-10-28 MED ORDER — ENSURE PUDDING PO PUDG
1.0000 | Freq: Three times a day (TID) | ORAL | Status: DC
  Administered 2011-10-28 (×2): 1 via ORAL

## 2011-10-28 NOTE — Progress Notes (Signed)
  Subjective: Stable and alert. No distress. Tolerating full liquid diet. Having loose stools. No nausea. The pain at rest. Still feels a little bit of fullness left lower quadrant and she ambulates. Her husband is here this morning and we talked some what about discharge planning and elective surgical issues.  Objective: Vital signs in last 24 hours: Temp:  [97.8 F (36.6 C)-98.5 F (36.9 C)] 97.8 F (36.6 C) (07/28 0544) Pulse Rate:  [60-75] 60  (07/28 0544) Resp:  [16-20] 20  (07/28 0544) BP: (91-101)/(55-65) 91/60 mmHg (07/28 0544) SpO2:  [99 %-100 %] 99 % (07/28 0544) Last BM Date: 10/27/11  Intake/Output from previous day: 07/27 0701 - 07/28 0700 In: 2024 [P.O.:390; I.V.:1634] Out: -  Intake/Output this shift:    General appearance: alert. No distress. Mental status normal. GI: soft. Nondistended. Still with tender fullness left lower quadrant. No peritoneal signs.  Lab Results:  No results found for this or any previous visit (from the past 24 hour(s)).   Studies/Results: @RISRSLT24 @     . antiseptic oral rinse  15 mL Mouth Rinse q12n4p  . chlorhexidine  15 mL Mouth Rinse BID  . ertapenem (INVANZ) IV  1 g Intravenous Q24H  . pantoprazole (PROTONIX) IV  40 mg Intravenous QHS     Assessment/Plan: Recurrent sigmoid diverticulitis with phlegmon, but no drainable abscess. Spine clinically but slowly to antibiotics.  She does not want to advance her diet for fear that this will aggravate the diverticulitis, so we will continue full liquids today and get her into were 3 times daily. Continue antibiotics intravenously. Check CBC tomorrow.  Ultimate plan would be discharg home in 2-3 days, assuming she becomes nontender, continue oral antibiotics such as Cipro and Flagyl for about 3 weeks, and then followup with [place] in the office to consider one stage sigmoid colectomy electively.    LOS: 3 days    Pinki Rottman M. Dalbert Batman, M.D., [facility] General and Minimally invasive Surgery Breast and Colorectal Surgery Office:   [phone] Pager:   [phone]  10/28/2011  . .prob

## 2011-10-29 LAB — CBC WITH DIFFERENTIAL/PLATELET
Basophils Relative: 1 % (ref 0–1)
Eosinophils Absolute: 0.3 10*3/uL (ref 0.0–0.7)
MCH: 33.9 pg (ref 26.0–34.0)
MCHC: 33.7 g/dL (ref 30.0–36.0)
Neutrophils Relative %: 56 % (ref 43–77)
Platelets: 356 10*3/uL (ref 150–400)
RBC: 3.84 MIL/uL — ABNORMAL LOW (ref 3.87–5.11)

## 2011-10-29 MED ORDER — KCL IN DEXTROSE-NACL 20-5-0.9 MEQ/L-%-% IV SOLN
INTRAVENOUS | Status: DC
  Administered 2011-10-30: 75 mL/h via INTRAVENOUS
  Filled 2011-10-29 (×3): qty 1000

## 2011-10-29 NOTE — Progress Notes (Signed)
Patient ID: Natasha Barrera, female   DOB: 10/06/1965, 46 y.o.   MRN: [id number]    Subjective: Pt reports pain is about the same, more like a nagging tenderness then severe pain.  Tolerating full liquids but appetite not good, +BMs.  Denies fevers or chills  Objective: Vital signs in last 24 hours: Temp:  [97.8 F (36.6 C)-98.1 F (36.7 C)] 97.8 F (36.6 C) (07/29 0515) Pulse Rate:  [63-72] 67  (07/29 0515) Resp:  [18] 18  (07/29 0515) BP: (98-115)/(52-65) 115/65 mmHg (07/29 0515) SpO2:  [96 %-100 %] 98 % (07/29 0515) Last BM Date: 10/27/11  Intake/Output from previous day: 07/28 0701 - 07/29 0700 In: 1180 [P.O.:480; I.V.:700] Out: -  Intake/Output this shift:   Physical Exam: General: well developed, well nourished, no acute distress Heart: RRR Lungs: CTA bilateral Abd: soft, tender in LLQ with palp mass, +BS   Lab Results:  Results for orders placed during the hospital encounter of 10/25/11 (from the past 24 hour(s))  CBC WITH DIFFERENTIAL     Status: Abnormal   Collection Time   10/29/11  6:46 AM      Component Value Range   WBC 6.4  4.0 - 10.5 K/uL   RBC 3.84 (*) 3.87 - 5.11 MIL/uL   Hemoglobin 13.0  12.0 - 15.0 g/dL   HCT 38.6  36.0 - 46.0 %   MCV 100.5 (*) 78.0 - 100.0 fL   MCH 33.9  26.0 - 34.0 pg   MCHC 33.7  30.0 - 36.0 g/dL   RDW 14.1  11.5 - 15.5 %   Platelets 356  150 - 400 K/uL   Neutrophils Relative 56  43 - 77 %   Neutro Abs 3.6  1.7 - 7.7 K/uL   Lymphocytes Relative 31  12 - 46 %   Lymphs Abs 2.0  0.7 - 4.0 K/uL   Monocytes Relative 9  3 - 12 %   Monocytes Absolute 0.6  0.1 - 1.0 K/uL   Eosinophils Relative 4  0 - 5 %   Eosinophils Absolute 0.3  0.0 - 0.7 K/uL   Basophils Relative 1  0 - 1 %   Basophils Absolute 0.0  0.0 - 0.1 K/uL        . antiseptic oral rinse  15 mL Mouth Rinse q12n4p  . chlorhexidine  15 mL Mouth Rinse BID  . ertapenem (INVANZ) IV  1 g Intravenous Q24H  . feeding supplement  1 Container Oral TID BM  . pantoprazole  (PROTONIX) IV  40 mg Intravenous QHS     Assessment/Plan: 1. Recurrent sigmoid diverticulitis with phlegmon: no drainable abscess. On IV abx, will advance diet to soft today, Ultimate plan would be discharg home tomorrow, if she becomes nontender, continue oral antibiotics such as Cipro and Flagyl for about 3 weeks, and then followup with [place] in the office to consider one stage sigmoid colectomy electively.    LOS: 4 days   [place], [facility] Office:   [phone] 10/29/2011

## 2011-10-29 NOTE — Progress Notes (Signed)
Patient interviewed and examined, agree with PA note above.  Edward Jolly MD, FACS  10/29/2011 12:18 PM

## 2011-10-30 MED ORDER — METRONIDAZOLE 500 MG PO TABS
500.0000 mg | ORAL_TABLET | Freq: Two times a day (BID) | ORAL | Status: DC
  Administered 2011-10-30: 500 mg via ORAL
  Filled 2011-10-30 (×2): qty 1

## 2011-10-30 MED ORDER — OXYCODONE HCL 5 MG PO TABS
5.0000 mg | ORAL_TABLET | Freq: Four times a day (QID) | ORAL | Status: DC | PRN
  Administered 2011-10-30: 5 mg via ORAL

## 2011-10-30 MED ORDER — CIPROFLOXACIN HCL 500 MG PO TABS
500.0000 mg | ORAL_TABLET | Freq: Two times a day (BID) | ORAL | Status: DC
  Administered 2011-10-30: 500 mg via ORAL
  Filled 2011-10-30 (×3): qty 1

## 2011-10-30 MED ORDER — METRONIDAZOLE 500 MG PO TABS: 500.0000 mg | ORAL_TABLET | Freq: Two times a day (BID) | ORAL | Status: AC

## 2011-10-30 MED ORDER — METRONIDAZOLE 500 MG PO TABS: 500.0000 mg | ORAL_TABLET | Freq: Two times a day (BID) | ORAL | Status: DC

## 2011-10-30 MED ORDER — CIPROFLOXACIN HCL 500 MG PO TABS: 500.0000 mg | ORAL_TABLET | Freq: Two times a day (BID) | ORAL | Status: AC

## 2011-10-30 MED ORDER — PANTOPRAZOLE SODIUM 40 MG PO TBEC: 40.0000 mg | DELAYED_RELEASE_TABLET | Freq: Every day | ORAL | Status: DC

## 2011-10-30 MED ORDER — OXYCODONE HCL 5 MG PO TABS: 5.0000 mg | ORAL_TABLET | Freq: Four times a day (QID) | ORAL | Status: AC | PRN

## 2011-10-30 NOTE — Discharge Summary (Signed)
Physician Discharge Summary  Patient ID: Natasha Barrera MRN: [id number] DOB/AGE: [birth year] 46 y.o.  Admit date: 10/25/2011 Discharge date: 10/30/2011  Admitting Diagnosis: Diverticulitis without abscess  Discharge Diagnosis Diverticulitis without abscess  Consultants Nutritionist  Procedures None  Hospital Course: 46 yr old female who presented to [facility] with 24 hour history of abdominal pain in the LLQ.  Workup showed a phlegmon on the LLQ due to diverticulitis but no true abscess that was drainable.  The patient was admitted and placed on IV antibiotics.  She improved over several days and it was decided that she could be discharged home on po antibiotics for 3 weeks then follow up to schedule surgery in a one stage fashion versus two surgeries at the present time.  She was agreeable to this.  The nutritionist were consulted to help the patient with diet planning.  She will be discharged on cipro and flagyl for 3 weeks.       Medication List  As of 10/30/2011  8:18 AM   TAKE these medications         cholecalciferol 1000 UNITS tablet   Commonly known as: VITAMIN D   Take 1,000 Units by mouth daily.      ciprofloxacin 500 MG tablet   Commonly known as: CIPRO   Take 1 tablet (500 mg total) by mouth 2 (two) times daily.      metroNIDAZOLE 500 MG tablet   Commonly known as: FLAGYL   Take 1 tablet (500 mg total) by mouth every 12 (twelve) hours.      oxyCODONE 5 MG immediate release tablet   Commonly known as: Oxy IR/ROXICODONE   Take 1-2 tablets (5-10 mg total) by mouth every 6 (six) hours as needed.      pantoprazole 40 MG tablet   Commonly known as: PROTONIX   Take 1 tablet (40 mg total) by mouth daily at 12 noon.             Follow-up Information    Follow up with CORNETT,THOMAS A., MD. Schedule an appointment as soon as possible for a visit in 1 month. (Call our office to schedule an appointment to see Dr. Brantley Stage in 1-2 months to discuss surgery.)    Contact  information:   [facility], [place], [place] [phone]          Signed: [place], [place] [facility] [phone]  10/30/2011, 8:18 AM

## 2011-10-30 NOTE — Discharge Instructions (Signed)
1.  Follow diet recommended by nutrionists at hospital 2.  Call our office to schedule your appointment to see Dr. Luisa Hart in 1-2 months to discuss surgery. 3.  Call our office with questions, concerns, or worsening symptoms. 4.  Activity as tolerated. 5.  May use colace or Miralax for stool softeners if needed.

## 2011-10-30 NOTE — Discharge Summary (Signed)
Patient interviewed and examined, agree with PA note above.  Edward Jolly MD, FACS  10/30/2011 10:26 AM

## 2011-11-16 ENCOUNTER — Other Ambulatory Visit (INDEPENDENT_AMBULATORY_CARE_PROVIDER_SITE_OTHER): Payer: Self-pay | Admitting: General Surgery

## 2011-11-16 ENCOUNTER — Telehealth (INDEPENDENT_AMBULATORY_CARE_PROVIDER_SITE_OTHER): Payer: Self-pay | Admitting: General Surgery

## 2011-11-16 MED ORDER — OXYCODONE HCL 5 MG PO TABS: 5.0000 mg | ORAL_TABLET | Freq: Four times a day (QID) | ORAL | Status: DC | PRN

## 2011-11-16 NOTE — Telephone Encounter (Signed)
Pt was seen in hospital and has appt with Dr. Brantley Stage on 11/23/11.  She is out of Oxycondone 5 mg;  OTC ibuprofen is not enough for even marginal pain control.  Pt is requesting meds for pain to bridge her to her appt.  Please advise.  (Also seen by Dr. Excell Seltzer at the hospital.)

## 2011-11-23 ENCOUNTER — Encounter (INDEPENDENT_AMBULATORY_CARE_PROVIDER_SITE_OTHER): Payer: Self-pay | Admitting: Surgery

## 2011-11-23 ENCOUNTER — Ambulatory Visit (INDEPENDENT_AMBULATORY_CARE_PROVIDER_SITE_OTHER): Payer: 59 | Admitting: Surgery

## 2011-11-23 VITALS — BP 90/68 | HR 111 | Temp 97.4°F | Ht 64.0 in | Wt 112.4 lb

## 2011-11-23 DIAGNOSIS — K5732 Diverticulitis of large intestine without perforation or abscess without bleeding: Secondary | ICD-10-CM

## 2011-11-23 MED ORDER — OXYCODONE HCL 5 MG PO TABS: 5.0000 mg | ORAL_TABLET | Freq: Four times a day (QID) | ORAL | Status: DC | PRN

## 2011-11-23 NOTE — Progress Notes (Signed)
Patient ID: Natasha Barrera, female   DOB: Jan 15, 1966, 46 y.o.   MRN: [id number]  Chief Complaint  Patient presents with  . Pre-op Exam    eval diverticulitis    HPI Natasha Barrera is a 46 y.o. female.  Patient returns in followup of her sigmoid diverticulitis. She seen one month ago at [facility] after a flare up. She has intermittent pain and does okay on a full diet. She is having bowel movements. She is morning and evening pain is episodic and crampy in nature. She does take occasional pain medicine which allows her to function. There is no blood in her stool. Her stools soft at times runny. Denies fever or chills. No nausea or vomiting.she has had 2 severe attacks. HPI  History reviewed. No pertinent past medical history.  Past Surgical History  Procedure Date  . Hemorrhoid surgery 2002    History reviewed. No pertinent family history.  Social History History  Substance Use Topics  . Smoking status: Former Research scientist (life sciences)  . Smokeless tobacco: Former Systems developer    Quit date: 11/28/2010  . Alcohol Use: No    No Known Allergies  Current Outpatient Prescriptions  Medication Sig Dispense Refill  . cholecalciferol (VITAMIN D) 1000 UNITS tablet Take 1,000 Units by mouth daily.      Marland Kitchen oxyCODONE (ROXICODONE) 5 MG immediate release tablet Take 1 tablet (5 mg total) by mouth every 6 (six) hours as needed for pain.  40 tablet  0  . pantoprazole (PROTONIX) 40 MG tablet Take 1 tablet (40 mg total) by mouth daily at 12 noon.  30 tablet  6    Review of Systems Review of Systems  Constitutional: Positive for fever and fatigue.  HENT: Negative.   Eyes: Negative.   Respiratory: Negative.   Cardiovascular: Negative.   Gastrointestinal: Positive for abdominal pain and diarrhea.  Genitourinary: Negative.   Musculoskeletal: Negative.   Neurological: Negative.   Hematological: Negative.   Psychiatric/Behavioral: Negative.     Blood pressure 90/68, pulse 111, temperature 97.4 F (36.3 C),  temperature source Temporal, height 5' 4"  (1.626 m), weight 112 lb 6.4 oz (50.984 kg), last menstrual period 10/18/2011, SpO2 98.00%.  Physical Exam Physical Exam  Constitutional: She is oriented to person, place, and time. She appears well-developed and well-nourished.  HENT:  Head: Normocephalic and atraumatic.  Eyes: EOM are normal. Pupils are equal, round, and reactive to light.  Neck: Normal range of motion. Neck supple.  Cardiovascular: Normal rate and regular rhythm.   Pulmonary/Chest: Effort normal and breath sounds normal.  Abdominal: Soft. She exhibits no distension and no mass. There is no tenderness. There is no rebound and no guarding.  Musculoskeletal: Normal range of motion.  Neurological: She is alert and oriented to person, place, and time.  Skin: Skin is warm and dry. No erythema.  Psychiatric: She has a normal mood and affect. Her behavior is normal. Judgment and thought content normal.    Data Reviewed  CT ABDOMEN AND PELVIS WITH CONTRAST  Technique: Multidetector CT imaging of the abdomen and pelvis was  performed following the standard protocol during bolus  administration of intravenous contrast.  Contrast: 100 ml Omni 300  Comparison: July 31, 2010  Findings: The lung bases are clear. Several tiny hepatic cysts are  stable. The gallbladder, spleen, pancreas, adrenal glands,  kidneys, urinary bladder, uterus, osseous structures have a normal  appearance.  Again noted is sigmoid diverticulitis with prominent bowel wall  thickening, pericolonic stranding, and a small amount  of adjacent  free fluid. The length of diseased colon measures approximately 10  cm. There is also a gas, fluid, and soft tissue collection  superior to the inflamed loop of sigmoid colon which measures 6 cm,  consistent with perforation and extraluminal abscess. No free  pneumoperitoneum is identified. Contrast is present at the rectum,  and there is no evidence of obstruction.    IMPRESSION:  Sigmoid diverticulitis with adjacent abscess. Findings were  discussed with Dr. Cheron Schaumann at the time of the exam.  Original Report Authenticated By: Duayne Cal, M.D.    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  Laparoscopic partial colectomy discussed with the patient as well as non operative treatments. The risks of operative management include bleeding,  Infection,  Leak of anastamosis,  Ostomy formation, open procedure,  Sepsis,  Abcess,  Hernia,  DVT,  Pulmonary complications,  Cardiovascular  complications,  Injury to ureter,  Bladder,kidney,and anesthesia risks,  And death. The patient understands.  Questions answered.   The success of the procedure is 50-100  % for treating the patients symptoms. They agree to proceed.       Natasha Marton A. 11/23/2011, 11:51 AM

## 2011-11-23 NOTE — Patient Instructions (Signed)
Laparoscopic Colon Resection Laparoscopic colon resection is a relatively new procedure and is not performed in all centers. It may be done to remove a piece of the colon (large intestine) that may be sore and reddened (inflamed). It may be done to remove a portion of bowel that is blocked. The intestine may be blocked because of colon cancer. It is sometimes used to treat diseases of the bowel in which there are multiple small outgrowths from the bowel wall (polyps), which may predispose a person to cancer. LET YOUR CAREGIVER KNOW ABOUT:  Allergies.   Medications taken including herbs, eye drops, over the counter medications, and creams.   Use of steroids (by mouth or creams).   Previous problems with anesthetics or novocaine.   Possibility of pregnancy, if this applies.   History of blood clots (thrombophlebitis).   History of bleeding or blood problems.   Previous surgery.   Other health problems.  RISKS AND COMPLICATIONS Some problems, which occur following this procedure, include:  Infection: A germ starts growing in the wound. This can usually be treated with medicine that kills germs (antibiotics).   Bleeding following surgery may be a complication of almost all surgeries. Your surgeon takes every precaution to keep this from happening.   Damage to other organs may occur. If damage to other organs or excessive bleeding should occur it may be necessary to convert the laparoscopic procedure into an open abdominal (belly) procedure. This means the surgery is performed by opening the abdomen and performing the surgery under direct vision. Scarring from previous surgeries or disease may also be a cause to change this procedure to an open abdominal operation.   Sometimes a leak can occur in the line where the bowel was sewn together after the portion of bowel was removed.   It is possible for the bowel to become obstructed in the area where it was sewn together. When this happens, it  is sometimes necessary to operate again to repair this. This may be accomplished using the laparoscope or opening the abdomen and operating in the usual manner without the laparoscope.  BEFORE THE PROCEDURE You should be present 2 hours prior to your procedure or as instructed.  PROCEDURE  Laparoscopic means a laparoscope (a small pencil sized telescope) is used. You are made to sleep with medicine (anesthetized). Your surgeon inflates your belly (abdomen) with a needle like device (trocar and cannula). The inflation is done with a harmless gas (carbon dioxide). This makes your organs easier to see. The laparoscope is inserted into your abdomen through a small slit (incision) that allows your surgeon to see into the abdomen. Other small instruments, such as probes and operating instruments, are inserted into the abdomen through other small openings (ports). These ports allow the surgeon to perform the operation. Often surgeons attach a video camera to the laparoscope to enlarge the view. During the procedure the portion of bowel to be removed is taken out through one of the ports. A port may have to be enlarged if the bowel is too large to be removed. In this case a small incision will be made and some times the bowel is reconnected (anastamosis) outside the abdomen. After the procedure, the gas is released, and your incisions are closed with stitches (sutures). Because these incisions are small (usually less than one-half inch), there is usually minimal discomfort following the procedure. AFTER THE PROCEDURE The recovery time, if there are no problems, is shortened compared to regular surgery. You will rest in  a recovery room until you are stable and doing well. Following this, barring other problems you will be allowed to return to your room. Recovery times vary depending on what is found at surgery, the age of the patient, general health, etc. SEEK IMMEDIATE MEDICAL CARE IF:   There is redness, swelling,  or increasing pain in the wound area.   Pus is coming from the wound.   An unexplained oral temperature above 102 F (38.9 C) develops or as directed.   You notice a foul smell coming from the wound or dressing.   There is a breaking open of a wound (edges not staying together) after sutures have been removed.   You develop increasing abdominal pain.  Document Released: 06/09/2002 Document Revised: 03/08/2011 Document Reviewed: 04/18/2007 [facility] Patient Information 2012 [place].

## 2011-12-19 ENCOUNTER — Encounter (HOSPITAL_COMMUNITY): Payer: Self-pay | Admitting: Pharmacy Technician

## 2011-12-19 ENCOUNTER — Telehealth (INDEPENDENT_AMBULATORY_CARE_PROVIDER_SITE_OTHER): Payer: Self-pay | Admitting: General Surgery

## 2011-12-19 NOTE — Telephone Encounter (Signed)
Pt called for refill; scheduled for lap sigmoid colectomy on 12/26/11.  She states she only take one tab BID, occasionally TID.  Paged and updated Dr. Brantley Stage.  Oxycodone 5 mg, #25, 1 po Q6H prn pain, no refill---signed by Dr. Lucia Gaskins.  Pt aware to pick up Rx at front desk.

## 2011-12-20 ENCOUNTER — Encounter (HOSPITAL_COMMUNITY): Payer: Self-pay

## 2011-12-20 ENCOUNTER — Encounter (HOSPITAL_COMMUNITY)
Admission: RE | Admit: 2011-12-20 | Discharge: 2011-12-20 | Disposition: A | Discharge status: Home / Self Care | Payer: 59 | Source: Ambulatory Visit | Attending: Surgery | Admitting: Surgery

## 2011-12-20 LAB — HCG, SERUM, QUALITATIVE: Preg, Serum: NEGATIVE

## 2011-12-20 LAB — SURGICAL PCR SCREEN
MRSA, PCR: NEGATIVE
Staphylococcus aureus: NEGATIVE

## 2011-12-20 MED ORDER — CHLORHEXIDINE GLUCONATE 4 % EX LIQD: 1.0000 "application " | Freq: Once | CUTANEOUS | Status: DC

## 2011-12-20 NOTE — Progress Notes (Signed)
Repeat CBC & CMP DOS per lab not enough sample for CBC and hemolysis of CMP

## 2011-12-20 NOTE — Pre-Procedure Instructions (Signed)
52 Natasha Barrera  12/20/2011   Your procedure is scheduled on:  Sept 25, 2013  Report to [place] at 6:30 AM.  Call this number if you have problems the morning of surgery: [phone]   Remember:   Do not eat food:After Midnight.    Take these medicines the morning of surgery with A SIP OF WATER: pain pill as needed   Do not wear jewelry, make-up or nail polish.  Do not wear lotions, powders, or perfumes. You may wear deodorant.  Do not shave 48 hours prior to surgery. Men may shave face and neck.  Do not bring valuables to the hospital.  Contacts, dentures or bridgework may not be worn into surgery.  Leave suitcase in the car. After surgery it may be brought to your room.  For patients admitted to the hospital, checkout time is 11:00 AM the day of discharge.   Patients discharged the day of surgery will not be allowed to drive home.  Name and phone number of your driver:   Special Instructions: CHG Shower Shower 2 days before surgery and 1 day before surgery with Hibiclens.   Please read over the following fact sheets that you were given: Pain Booklet, Coughing and Deep Breathing and Surgical Site Infection Prevention

## 2011-12-25 HISTORY — PX: COLON SURGERY: SHX602

## 2011-12-25 MED ORDER — DEXTROSE 5 % IV SOLN
2.0000 g | Freq: Once | INTRAVENOUS | Status: AC
  Administered 2011-12-26: 2 g via INTRAVENOUS
  Filled 2011-12-25: qty 2

## 2011-12-25 MED ORDER — METRONIDAZOLE IN NACL 5-0.79 MG/ML-% IV SOLN
500.0000 mg | INTRAVENOUS | Status: AC
  Administered 2011-12-26: .5 g via INTRAVENOUS
  Filled 2011-12-25: qty 100

## 2011-12-26 ENCOUNTER — Encounter (HOSPITAL_COMMUNITY): Payer: Self-pay | Admitting: *Deleted

## 2011-12-26 ENCOUNTER — Ambulatory Visit (HOSPITAL_COMMUNITY): Payer: 59 | Admitting: *Deleted

## 2011-12-26 ENCOUNTER — Encounter (HOSPITAL_COMMUNITY)
Admission: RE | Disposition: A | Discharge status: Home / Self Care | Payer: Self-pay | Source: Ambulatory Visit | Attending: Surgery

## 2011-12-26 ENCOUNTER — Inpatient Hospital Stay (HOSPITAL_COMMUNITY)
Admission: RE | Admit: 2011-12-26 | Discharge: 2011-12-31 | DRG: 330 | Disposition: A | Discharge status: Home / Self Care | Payer: 59 | Source: Ambulatory Visit | Attending: Surgery | Admitting: Surgery

## 2011-12-26 DIAGNOSIS — K5732 Diverticulitis of large intestine without perforation or abscess without bleeding: Principal | ICD-10-CM | POA: Diagnosis present

## 2011-12-26 DIAGNOSIS — K5730 Diverticulosis of large intestine without perforation or abscess without bleeding: Secondary | ICD-10-CM

## 2011-12-26 DIAGNOSIS — D62 Acute posthemorrhagic anemia: Secondary | ICD-10-CM | POA: Diagnosis not present

## 2011-12-26 DIAGNOSIS — K660 Peritoneal adhesions (postprocedural) (postinfection): Secondary | ICD-10-CM | POA: Diagnosis present

## 2011-12-26 DIAGNOSIS — Z87891 Personal history of nicotine dependence: Secondary | ICD-10-CM

## 2011-12-26 DIAGNOSIS — Z5331 Laparoscopic surgical procedure converted to open procedure: Secondary | ICD-10-CM

## 2011-12-26 HISTORY — DX: Diverticulosis of both small and large intestine without perforation or abscess without bleeding: K57.50

## 2011-12-26 HISTORY — DX: Diverticulitis of large intestine without perforation or abscess without bleeding: K57.32

## 2011-12-26 LAB — CBC
Hemoglobin: 10.6 g/dL — ABNORMAL LOW (ref 12.0–15.0)
MCH: 31.6 pg (ref 26.0–34.0)
RBC: 3.35 MIL/uL — ABNORMAL LOW (ref 3.87–5.11)

## 2011-12-26 LAB — CBC WITH DIFFERENTIAL/PLATELET
Basophils Absolute: 0 10*3/uL (ref 0.0–0.1)
Basophils Relative: 0 % (ref 0–1)
Eosinophils Absolute: 0.1 10*3/uL (ref 0.0–0.7)
Hemoglobin: 12.8 g/dL (ref 12.0–15.0)
MCHC: 33.8 g/dL (ref 30.0–36.0)
Neutro Abs: 5.1 10*3/uL (ref 1.7–7.7)
Neutrophils Relative %: 63 % (ref 43–77)
Platelets: 394 10*3/uL (ref 150–400)
RDW: 14.2 % (ref 11.5–15.5)

## 2011-12-26 LAB — COMPREHENSIVE METABOLIC PANEL
AST: 23 U/L (ref 0–37)
Albumin: 3.4 g/dL — ABNORMAL LOW (ref 3.5–5.2)
Alkaline Phosphatase: 111 U/L (ref 39–117)
Chloride: 99 mEq/L (ref 96–112)
Potassium: 4.3 mEq/L (ref 3.5–5.1)
Sodium: 137 mEq/L (ref 135–145)
Total Bilirubin: 0.5 mg/dL (ref 0.3–1.2)
Total Protein: 7.4 g/dL (ref 6.0–8.3)

## 2011-12-26 SURGERY — COLECTOMY, SIGMOID, LAPAROSCOPIC
Anesthesia: General | Site: Abdomen | Wound class: Clean Contaminated

## 2011-12-26 MED ORDER — ROCURONIUM BROMIDE 100 MG/10ML IV SOLN
INTRAVENOUS | Status: DC | PRN
  Administered 2011-12-26: 40 mg via INTRAVENOUS
  Administered 2011-12-26: 10 mg via INTRAVENOUS

## 2011-12-26 MED ORDER — HYDROMORPHONE HCL PF 1 MG/ML IJ SOLN
0.2500 mg | INTRAMUSCULAR | Status: DC | PRN
  Administered 2011-12-26 (×4): 0.5 mg via INTRAVENOUS

## 2011-12-26 MED ORDER — MIDAZOLAM HCL 2 MG/2ML IJ SOLN: 0.5000 mg | Freq: Once | INTRAMUSCULAR | Status: DC | PRN

## 2011-12-26 MED ORDER — ONDANSETRON HCL 4 MG/2ML IJ SOLN: 4.0000 mg | Freq: Four times a day (QID) | INTRAMUSCULAR | Status: DC | PRN

## 2011-12-26 MED ORDER — DEXAMETHASONE SODIUM PHOSPHATE 4 MG/ML IJ SOLN
INTRAMUSCULAR | Status: DC | PRN
  Administered 2011-12-26: 4 mg via INTRAVENOUS

## 2011-12-26 MED ORDER — ALVIMOPAN 12 MG PO CAPS
12.0000 mg | ORAL_CAPSULE | Freq: Two times a day (BID) | ORAL | Status: DC
  Administered 2011-12-27 – 2011-12-30 (×8): 12 mg via ORAL
  Filled 2011-12-26 (×10): qty 1

## 2011-12-26 MED ORDER — DIPHENHYDRAMINE HCL 12.5 MG/5ML PO ELIX
12.5000 mg | ORAL_SOLUTION | Freq: Four times a day (QID) | ORAL | Status: DC | PRN
  Filled 2011-12-26: qty 5

## 2011-12-26 MED ORDER — SODIUM CHLORIDE 0.9 % IV SOLN
1.0000 g | Freq: Once | INTRAVENOUS | Status: AC
  Administered 2011-12-26: 1 g via INTRAVENOUS
  Filled 2011-12-26: qty 1

## 2011-12-26 MED ORDER — ONDANSETRON HCL 4 MG PO TABS: 4.0000 mg | ORAL_TABLET | Freq: Four times a day (QID) | ORAL | Status: DC | PRN

## 2011-12-26 MED ORDER — DIPHENHYDRAMINE HCL 50 MG/ML IJ SOLN: 12.5000 mg | Freq: Four times a day (QID) | INTRAMUSCULAR | Status: DC | PRN

## 2011-12-26 MED ORDER — KCL IN DEXTROSE-NACL 20-5-0.45 MEQ/L-%-% IV SOLN
INTRAVENOUS | Status: DC
  Administered 2011-12-26 – 2011-12-30 (×7): via INTRAVENOUS
  Filled 2011-12-26 (×10): qty 1000

## 2011-12-26 MED ORDER — KETOROLAC TROMETHAMINE 15 MG/ML IJ SOLN
15.0000 mg | Freq: Four times a day (QID) | INTRAMUSCULAR | Status: DC | PRN
  Administered 2011-12-26 – 2011-12-27 (×2): 15 mg via INTRAVENOUS
  Filled 2011-12-26 (×2): qty 1

## 2011-12-26 MED ORDER — SODIUM CHLORIDE 0.9 % IJ SOLN: 9.0000 mL | INTRAMUSCULAR | Status: DC | PRN

## 2011-12-26 MED ORDER — ENOXAPARIN SODIUM 40 MG/0.4ML ~~LOC~~ SOLN
40.0000 mg | SUBCUTANEOUS | Status: DC
  Administered 2011-12-27: 40 mg via SUBCUTANEOUS
  Filled 2011-12-26 (×2): qty 0.4

## 2011-12-26 MED ORDER — NEOSTIGMINE METHYLSULFATE 1 MG/ML IJ SOLN
INTRAMUSCULAR | Status: DC | PRN
  Administered 2011-12-26: 3 mg via INTRAVENOUS

## 2011-12-26 MED ORDER — MEPERIDINE HCL 25 MG/ML IJ SOLN
6.2500 mg | INTRAMUSCULAR | Status: DC | PRN
Start: 2011-12-26 — End: 2011-12-26

## 2011-12-26 MED ORDER — BUPIVACAINE-EPINEPHRINE PF 0.25-1:200000 % IJ SOLN
INTRAMUSCULAR | Status: AC
  Filled 2011-12-26: qty 30

## 2011-12-26 MED ORDER — FENTANYL CITRATE 0.05 MG/ML IJ SOLN
INTRAMUSCULAR | Status: DC | PRN
  Administered 2011-12-26 (×2): 100 ug via INTRAVENOUS
  Administered 2011-12-26 (×2): 50 ug via INTRAVENOUS
  Administered 2011-12-26: 150 ug via INTRAVENOUS
  Administered 2011-12-26 (×2): 100 ug via INTRAVENOUS
  Administered 2011-12-26 (×2): 50 ug via INTRAVENOUS

## 2011-12-26 MED ORDER — ONDANSETRON HCL 4 MG/2ML IJ SOLN
INTRAMUSCULAR | Status: DC | PRN
  Administered 2011-12-26: 4 mg via INTRAVENOUS

## 2011-12-26 MED ORDER — HYDROMORPHONE 0.3 MG/ML IV SOLN
INTRAVENOUS | Status: DC
  Administered 2011-12-26: 25 mL via INTRAVENOUS
  Administered 2011-12-26: 7.2 mg via INTRAVENOUS
  Administered 2011-12-26: 19:00:00 via INTRAVENOUS
  Administered 2011-12-27: 4.8 mg via INTRAVENOUS
  Administered 2011-12-27: 3.6 mg via INTRAVENOUS
  Administered 2011-12-27: 3.65 mg via INTRAVENOUS
  Administered 2011-12-27: 12:00:00 via INTRAVENOUS
  Administered 2011-12-27: 10 mg via INTRAVENOUS
  Administered 2011-12-27: via INTRAVENOUS
  Administered 2011-12-27: 3.6 mg via INTRAVENOUS
  Administered 2011-12-27: 0.9 mg via INTRAVENOUS
  Administered 2011-12-27: 05:00:00 via INTRAVENOUS
  Administered 2011-12-27: 3 mg via INTRAVENOUS
  Administered 2011-12-28: 2.1 mg via INTRAVENOUS
  Administered 2011-12-28: 8 mg via INTRAVENOUS
  Filled 2011-12-26 (×4): qty 25

## 2011-12-26 MED ORDER — NALOXONE HCL 0.4 MG/ML IJ SOLN: 0.4000 mg | INTRAMUSCULAR | Status: DC | PRN

## 2011-12-26 MED ORDER — MIDAZOLAM HCL 5 MG/5ML IJ SOLN
INTRAMUSCULAR | Status: DC | PRN
  Administered 2011-12-26: 2 mg via INTRAVENOUS

## 2011-12-26 MED ORDER — HYDROMORPHONE 0.3 MG/ML IV SOLN
INTRAVENOUS | Status: AC
  Filled 2011-12-26: qty 25

## 2011-12-26 MED ORDER — LACTATED RINGERS IV SOLN
INTRAVENOUS | Status: DC | PRN
  Administered 2011-12-26 (×5): via INTRAVENOUS

## 2011-12-26 MED ORDER — ALVIMOPAN 12 MG PO CAPS
12.0000 mg | ORAL_CAPSULE | Freq: Once | ORAL | Status: AC
  Administered 2011-12-26: 12 mg via ORAL
  Filled 2011-12-26 (×2): qty 1

## 2011-12-26 MED ORDER — PROMETHAZINE HCL 25 MG/ML IJ SOLN: 6.2500 mg | INTRAMUSCULAR | Status: DC | PRN

## 2011-12-26 MED ORDER — PROPOFOL 10 MG/ML IV BOLUS
INTRAVENOUS | Status: DC | PRN
  Administered 2011-12-26: 50 mg via INTRAVENOUS
  Administered 2011-12-26: 200 mg via INTRAVENOUS

## 2011-12-26 MED ORDER — HYDROMORPHONE HCL PF 1 MG/ML IJ SOLN
INTRAMUSCULAR | Status: AC
  Filled 2011-12-26: qty 2

## 2011-12-26 MED ORDER — GLYCOPYRROLATE 0.2 MG/ML IJ SOLN
INTRAMUSCULAR | Status: DC | PRN
  Administered 2011-12-26: 0.4 mg via INTRAVENOUS

## 2011-12-26 MED ORDER — 0.9 % SODIUM CHLORIDE (POUR BTL) OPTIME
TOPICAL | Status: DC | PRN
  Administered 2011-12-26 (×2): 2000 mL

## 2011-12-26 MED ORDER — INDIGOTINDISULFONATE SODIUM 8 MG/ML IJ SOLN
INTRAMUSCULAR | Status: DC | PRN
  Administered 2011-12-26: 40 mg via INTRAVENOUS

## 2011-12-26 MED ORDER — HYDROMORPHONE HCL PF 1 MG/ML IJ SOLN
INTRAMUSCULAR | Status: DC | PRN
  Administered 2011-12-26 (×2): 0.5 mg via INTRAVENOUS

## 2011-12-26 SURGICAL SUPPLY — 87 items
ADH SKN CLS APL DERMABOND .7 (GAUZE/BANDAGES/DRESSINGS)
APPLIER CLIP 5 13 M/L LIGAMAX5 (MISCELLANEOUS)
APR CLP MED LRG 5 ANG JAW (MISCELLANEOUS)
BLADE EXTENDED COATED 6.5IN (ELECTRODE) ×2 IMPLANT
BLADE HEX COATED 2.75 (ELECTRODE) ×2 IMPLANT
BLADE SURG SZ10 CARB STEEL (BLADE) IMPLANT
CABLE HIGH FREQUENCY MONO STRZ (ELECTRODE) ×2 IMPLANT
CANISTER SUCTION 2500CC (MISCELLANEOUS) ×2 IMPLANT
CANNULA ENDOPATH XCEL 11M (ENDOMECHANICALS) IMPLANT
CELLS DAT CNTRL 66122 CELL SVR (MISCELLANEOUS) IMPLANT
CLIP APPLIE 5 13 M/L LIGAMAX5 (MISCELLANEOUS) IMPLANT
CLOTH BEACON ORANGE TIMEOUT ST (SAFETY) ×2 IMPLANT
COVER MAYO STAND STRL (DRAPES) ×2 IMPLANT
DECANTER SPIKE VIAL GLASS SM (MISCELLANEOUS) IMPLANT
DERMABOND ADVANCED (GAUZE/BANDAGES/DRESSINGS)
DERMABOND ADVANCED .7 DNX12 (GAUZE/BANDAGES/DRESSINGS) IMPLANT
DRAIN CHANNEL 19F RND (DRAIN) ×2 IMPLANT
DRAPE LAPAROSCOPIC ABDOMINAL (DRAPES) ×2 IMPLANT
DRAPE LG THREE QUARTER DISP (DRAPES) IMPLANT
DRAPE WARM FLUID 44X44 (DRAPE) ×2 IMPLANT
DRSG VAC ATS MED SENSATRAC (GAUZE/BANDAGES/DRESSINGS) ×2 IMPLANT
ELECT CAUTERY BLADE 6.4 (BLADE) ×2 IMPLANT
ELECT REM PT RETURN 9FT ADLT (ELECTROSURGICAL) ×2
ELECTRODE REM PT RTRN 9FT ADLT (ELECTROSURGICAL) ×1 IMPLANT
ENSEAL DEVICE STD TIP 35CM (ENDOMECHANICALS) IMPLANT
EVACUATOR SILICONE 100CC (DRAIN) ×2 IMPLANT
FILTER SMOKE EVAC LAPAROSHD (FILTER) IMPLANT
GLOVE BIO SURGEON STRL SZ8 (GLOVE) ×4 IMPLANT
GLOVE BIOGEL PI IND STRL 7.0 (GLOVE) ×2 IMPLANT
GLOVE BIOGEL PI IND STRL 8 (GLOVE) ×1 IMPLANT
GLOVE BIOGEL PI INDICATOR 7.0 (GLOVE) ×2
GLOVE BIOGEL PI INDICATOR 8 (GLOVE) ×1
GLOVE EUDERMIC 7 POWDERFREE (GLOVE) ×4 IMPLANT
GLOVE INDICATOR 8.0 STRL GRN (GLOVE) ×4 IMPLANT
GLOVE SS BIOGEL STRL SZ 8 (GLOVE) IMPLANT
GLOVE SUPERSENSE BIOGEL SZ 8 (GLOVE)
GLOVE SURG SS PI 7.0 STRL IVOR (GLOVE) ×2 IMPLANT
GOWN STRL NON-REIN LRG LVL3 (GOWN DISPOSABLE) ×6 IMPLANT
GOWN STRL REIN XL XLG (GOWN DISPOSABLE) ×2 IMPLANT
HAND ACTIVATED (MISCELLANEOUS) ×2 IMPLANT
KIT BASIN OR (CUSTOM PROCEDURE TRAY) ×2 IMPLANT
LEGGING LITHOTOMY PAIR STRL (DRAPES) ×2 IMPLANT
LIGASURE IMPACT 36 18CM CVD LR (INSTRUMENTS) ×2 IMPLANT
NS IRRIG 1000ML POUR BTL (IV SOLUTION) ×4 IMPLANT
PENCIL BUTTON HOLSTER BLD 10FT (ELECTRODE) ×2 IMPLANT
RELOAD PROXIMATE 75MM BLUE (ENDOMECHANICALS) ×2 IMPLANT
RTRCTR WOUND ALEXIS 18CM MED (MISCELLANEOUS)
SCISSORS LAP 5X35 DISP (ENDOMECHANICALS) ×2 IMPLANT
SET IRRIG TUBING LAPAROSCOPIC (IRRIGATION / IRRIGATOR) ×2 IMPLANT
SLEEVE ENDOPATH XCEL 5M (ENDOMECHANICALS) ×6 IMPLANT
SPONGE GAUZE 4X4 12PLY (GAUZE/BANDAGES/DRESSINGS) IMPLANT
SPONGE LAP 18X18 X RAY DECT (DISPOSABLE) ×4 IMPLANT
STAPLER PROXIMATE 75MM BLUE (STAPLE) ×2 IMPLANT
STAPLER VISISTAT 35W (STAPLE) ×2 IMPLANT
SUCTION POOLE TIP (SUCTIONS) ×2 IMPLANT
SUT PDS AB 1 CTX 36 (SUTURE) IMPLANT
SUT PROLENE 2 0 KS (SUTURE) ×2 IMPLANT
SUT PROLENE 2 0 SH DA (SUTURE) IMPLANT
SUT SILK 2 0 (SUTURE) ×2
SUT SILK 2 0 SH CR/8 (SUTURE) IMPLANT
SUT SILK 2-0 18XBRD TIE 12 (SUTURE) ×1 IMPLANT
SUT SILK 3 0 (SUTURE) ×1
SUT SILK 3 0 SH CR/8 (SUTURE) IMPLANT
SUT SILK 3-0 18XBRD TIE 12 (SUTURE) ×1 IMPLANT
SUT VIC AB 0 CT2 27 (SUTURE) ×2 IMPLANT
SUT VIC AB 3-0 SH 18 (SUTURE) ×2 IMPLANT
SUT VIC AB 3-0 SH 8-18 (SUTURE) ×2 IMPLANT
SUT VICRYL 2 0 18  UND BR (SUTURE)
SUT VICRYL 2 0 18 UND BR (SUTURE) IMPLANT
SYR BULB IRRIGATION 50ML (SYRINGE) IMPLANT
SYS LAPSCP GELPORT 120MM (MISCELLANEOUS) ×2
SYSTEM LAPSCP GELPORT 120MM (MISCELLANEOUS) ×1 IMPLANT
TAPE CLOTH SURG 4X10 WHT LF (GAUZE/BANDAGES/DRESSINGS) ×2 IMPLANT
TOWEL OR 17X26 10 PK STRL BLUE (TOWEL DISPOSABLE) ×2 IMPLANT
TRAY FOLEY CATH 14FRSI W/METER (CATHETERS) ×2 IMPLANT
TRAY LAP CHOLE (CUSTOM PROCEDURE TRAY) IMPLANT
TRAY LAPAROSCOPIC (CUSTOM PROCEDURE TRAY) ×2 IMPLANT
TRAY PROCTOSCOPIC FIBER OPTIC (SET/KITS/TRAYS/PACK) ×2 IMPLANT
TROCAR BLADELESS OPT 5 75 (ENDOMECHANICALS) IMPLANT
TROCAR XCEL BLUNT TIP 100MML (ENDOMECHANICALS) IMPLANT
TROCAR XCEL NON-BLD 11X100MML (ENDOMECHANICALS) IMPLANT
TROCAR XCEL NON-BLD 5MMX100MML (ENDOMECHANICALS) ×2 IMPLANT
TUBE CONNECTING 20X1/4 (TUBING) ×2 IMPLANT
TUBING FILTER THERMOFLATOR (ELECTROSURGICAL) ×2 IMPLANT
TUBING INSUFFLATION 10FT LAP (TUBING) IMPLANT
YANKAUER SUCT BULB TIP 10FT TU (MISCELLANEOUS) IMPLANT
YANKAUER SUCT BULB TIP NO VENT (SUCTIONS) ×2 IMPLANT

## 2011-12-26 NOTE — Interval H&P Note (Signed)
History and Physical Interval Note:  12/26/2011 8:30 AM  Natasha Barrera  has presented today for surgery, with the diagnosis of DIVERTICULITIS  The various methods of treatment have been discussed with the patient and family. After consideration of risks, benefits and other options for treatment, the patient has consented to  Procedure(s) (LRB) with comments: LAPAROSCOPIC SIGMOID COLECTOMY (N/A) - Laparoscopic assissted sigmoid colectomy as a surgical intervention .  The patient's history has been reviewed, patient examined, no change in status, stable for surgery.  I have reviewed the patient's chart and labs.  Questions were answered to the patient's satisfaction.     Chelsa Stout A.

## 2011-12-26 NOTE — Anesthesia Preprocedure Evaluation (Addendum)
Anesthesia Evaluation  Patient identified by MRN, date of birth, ID band Patient awake    Reviewed: Allergy & Precautions, H&P , NPO status , Patient's Chart, lab work & pertinent test results  History of Anesthesia Complications Negative for: history of anesthetic complications  Airway Mallampati: I TM Distance: >3 FB Neck ROM: Full    Dental No notable dental hx. (+) Teeth Intact and Dental Advisory Given   Pulmonary Current Smoker,  breath sounds clear to auscultation  Pulmonary exam normal       Cardiovascular negative cardio ROS  Rhythm:Regular Rate:Normal     Neuro/Psych negative neurological ROS  negative psych ROS   GI/Hepatic Neg liver ROS, Abdominal pain: narcotics daily   Endo/Other  negative endocrine ROS  Renal/GU negative Renal ROS     Musculoskeletal   Abdominal   Peds  Hematology   Anesthesia Other Findings   Reproductive/Obstetrics LMP 3-4 weeks ago: patient declines testing (says no exposure), preg test 12/20/11 NEG                          Anesthesia Physical Anesthesia Plan  ASA: II  Anesthesia Plan: General   Post-op Pain Management:    Induction: Intravenous  Airway Management Planned: Oral ETT  Additional Equipment:   Intra-op Plan:   Post-operative Plan: Extubation in OR  Informed Consent: I have reviewed the patients History and Physical, chart, labs and discussed the procedure including the risks, benefits and alternatives for the proposed anesthesia with the patient or authorized representative who has indicated his/her understanding and acceptance.   Dental advisory given  Plan Discussed with: CRNA and Surgeon  Anesthesia Plan Comments: (Plan routine monitors, GETA)        Anesthesia Quick Evaluation

## 2011-12-26 NOTE — H&P (Signed)
Natasha Barrera   MRN: [id number]   Description: 46 year old female  Provider: Turner Daniels., MD  Department: [facility]        Diagnoses     Diverticulitis large intestine   - Primary    562.11      Reason for Visit     Pre-op Exam    eval diverticulitis        Vitals - Last Recorded       BP Pulse Temp Ht Wt BMI    90/68 111 97.4 F (36.3 C) (Temporal) 5' 4"  (1.626 m) 112 lb 6.4 oz (50.984 kg) 19.29 kg/m2         SpO2 LMP            98% 10/18/2011               Progress Notes    Patient ID: Natasha Barrera, female   DOB: 12-02-1965, 46 y.o.   MRN: [id number]    Chief Complaint   Patient presents with   .  Pre-op Exam       eval diverticulitis      HPI Natasha Barrera is a 46 y.o. female.  Patient returns in followup of her sigmoid diverticulitis. She seen one month ago at [facility] after a flare up. She has intermittent pain and does okay on a full diet. She is having bowel movements. She is morning and evening pain is episodic and crampy in nature. She does take occasional pain medicine which allows her to function. There is no blood in her stool. Her stools soft at times runny. Denies fever or chills. No nausea or vomiting.she has had 2 severe attacks. HPI   History reviewed. No pertinent past medical history.    Past Surgical History   Procedure  Date   .  Hemorrhoid surgery  2002      History reviewed. No pertinent family history.   Social History History   Substance Use Topics   .  Smoking status:  Former Research scientist (life sciences)   .  Smokeless tobacco:  Former Systems developer       Quit date:  11/28/2010   .  Alcohol Use:  No      No Known Allergies    Current Outpatient Prescriptions   Medication  Sig  Dispense  Refill   .  cholecalciferol (VITAMIN D) 1000 UNITS tablet  Take 1,000 Units by mouth daily.         Marland Kitchen  oxyCODONE (ROXICODONE) 5 MG immediate release tablet  Take 1 tablet (5 mg total) by mouth every 6 (six) hours as needed for pain.   40  tablet   0   .  pantoprazole (PROTONIX) 40 MG tablet  Take 1 tablet (40 mg total) by mouth daily at 12 noon.   30 tablet   6      Review of Systems Review of Systems  Constitutional: Positive for fever and fatigue.  HENT: Negative.   Eyes: Negative.   Respiratory: Negative.   Cardiovascular: Negative.   Gastrointestinal: Positive for abdominal pain and diarrhea.  Genitourinary: Negative.   Musculoskeletal: Negative.   Neurological: Negative.   Hematological: Negative.   Psychiatric/Behavioral: Negative.     Blood pressure 90/68, pulse 111, temperature 97.4 F (36.3 C), temperature source Temporal, height 5' 4"  (1.626 m), weight 112 lb 6.4 oz (50.984 kg), last menstrual period 10/18/2011, SpO2 98.00%.   Physical Exam Physical Exam  Constitutional: She is oriented to person, place,  and time. She appears well-developed and well-nourished.  HENT:   Head: Normocephalic and atraumatic.  Eyes: EOM are normal. Pupils are equal, round, and reactive to light.  Neck: Normal range of motion. Neck supple.  Cardiovascular: Normal rate and regular rhythm.   Pulmonary/Chest: Effort normal and breath sounds normal.  Abdominal: Soft. She exhibits no distension and no mass. There is no tenderness. There is no rebound and no guarding.  Musculoskeletal: Normal range of motion.  Neurological: She is alert and oriented to person, place, and time.  Skin: Skin is warm and dry. No erythema.  Psychiatric: She has a normal mood and affect. Her behavior is normal. Judgment and thought content normal.    Data Reviewed    CT ABDOMEN AND PELVIS WITH CONTRAST    Technique: Multidetector CT imaging of the abdomen and pelvis was   performed following the standard protocol during bolus   administration of intravenous contrast.   Contrast: 100 ml Omni 300   Comparison: July 31, 2010   Findings: The lung bases are clear. Several tiny hepatic cysts are   stable. The gallbladder, spleen, pancreas, adrenal  glands,   kidneys, urinary bladder, uterus, osseous structures have a normal   appearance.   Again noted is sigmoid diverticulitis with prominent bowel wall   thickening, pericolonic stranding, and a small amount of adjacent   free fluid. The length of diseased colon measures approximately 10   cm. There is also a gas, fluid, and soft tissue collection   superior to the inflamed loop of sigmoid colon which measures 6 cm,   consistent with perforation and extraluminal abscess. No free   pneumoperitoneum is identified. Contrast is present at the rectum,   and there is no evidence of obstruction.   IMPRESSION:   Sigmoid diverticulitis with adjacent abscess. Findings were   discussed with Dr. Cheron Schaumann at the time of the exam.   Original Report Authenticated By: Duayne Cal, M.D.        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  Laparoscopic partial colectomy discussed with the patient as well as non operative treatments. The risks of operative management include bleeding,  Infection,  Leak of anastamosis,  Ostomy formation, open procedure,  Sepsis,  Abcess,  Hernia,  DVT,  Pulmonary complications,  Cardiovascular  complications,  Injury to ureter,  Bladder,kidney,and anesthesia risks,  And death. The patient understands.  Questions answered.   The success of the procedure is 50-100  % for treating the patients symptoms. They agree to proceed.       Vontae Court A. 12/26/2011

## 2011-12-26 NOTE — Progress Notes (Signed)
Report given to sharon rn as caregiver

## 2011-12-26 NOTE — Op Note (Signed)
Laparoscopic Sigmoid Colectomy  With splenic flexure takedown and rigid sigmoidoscopy Procedure Note  Indications: This patient presents for a laparoscopic sigmoid colectomy for chronic sigmoid diverticulitis. She has had multiple attacks the most recent about 6 weeks ago. She has chronic abdominal pain. She was worked up with colonoscopy last year which was normal. CT scan showed thickened sigmoid colon one year ago. She is severe attack requiring hospitalization about 8 weeks ago. She is recovered from that but continues to have intermittent chronic abdominal pain.. The patient underwent a complete mechanical and antibiotic bowel prep prior to his operation. The procedure was discussed with the patient.  Laparoscopic sigmoid  colectomy discussed with the patient as well as non operative treatments. The risks of operative management include bleeding,  Infection,  Leak of anastamosis,  Ostomy formation, open procedure,  Sepsis,  Abcess,  Hernia,  DVT,  Pulmonary complications,  Cardiovascular  complications,  Injury to ureter,  Bladder,kidney,and anesthesia risks,  And death. The patient understands.  Questions answered.   The success of the procedure is 50-100 % for treating the patients symptoms. They agree to proceed.  Pre-operative Diagnosis: sigmoid colon diverticulitis  Post-operative Diagnosis: same with significant chronic scarring and adhesions to small bowel and retroperitoneum  Surgeon: Adelaido Nicklaus A.   Assistants; Dr Dalbert Batman  MD  Anesthesia: General endotracheal anesthesia  ASA Class: 2  Procedure Details  The patient was seen in the Holding Room. The risks, benefits, complications, treatment options, and expected outcomes were discussed with the patient. The possibilities of reaction to medication, pulmonary aspiration, perforation of viscus, bleeding, recurrent infection, finding a normal colon, the need for additional procedures, failure to diagnose a condition, and creating a  complication requiring transfusion or operation were discussed with the patient. The patient concurred with the proposed plan, giving informed consent.   The patient was taken to Operating Room # 2, identified as Lutisha D Sivley and the procedure verified as partial colectomy. A Time Out was held and the above information confirmed.  The patient was brought to the operating room and placed supine. After induction of a general anesthetic,  The patient was placed in lithotomy.a Foley catheter was inserted and the abdomen and perineum was prepped and draped in standard fashion. Timeout was done. A 5 mm Optiview port was placed in the right upper quadrant after small skin incision was made and the trocar was advanced under direct laparoscopic guidance into the abdominal cavity without difficulty. Insufflation to 15 mm of mercury CO2 was done.   Exploration revealed a normal omentum, colon, small bowel, peritoneum, liver, and stomach. Two RLQ  Ports were placed which were 5 MM. A fourth port was place din the left lower quadrant.   The descending colon and splenic flexure were then mobilized with gentle retraction of the colon in a medial direction with mobilization of the peritoneal reflection with cautery and the harmonic scalpel. Mobilization of this area was complete to expose the retroperitoneum. The ureter was not identified during this mobilization process but no structures were divided during this mobilization. There was no blood loss during this portion of the procedure.      The mobilization continued to include the splenic flexure and the hepatic flexure with the harmonic scalpel in a bloodless field.  The sigmoid colon was densely adherent to the left lower quadrant retroperitoneum, left fallopian tube and uterus. The small bowel was densely adherent to the medial aspect of the sigmoid colon. This was extremely thick and difficult to dissect  through. Through a 7 cm lower abdominal incision, a GelPort was  placed. I then inserted my hand and tried to manipulate the sigmoid colon off of the retroperitoneum but it was too densely adherent. At this point, I felt that conversion to an open part of procedure would be best. Upon examining the splenic flexure, it seemed well mobilized and the spleen uninjured. No signs of colonic injury during mobilization.  A Bookwalter retractor was placed after removal of the GelPort and the incision was enlarged 2 cm superiorly and inferiorly. These dissection began trying to mobilize the sigmoid colon off the left lateral retroperitoneal wall. The adhesions were extremely dense. We will to mobilize the small bowel off the sigmoid colon and the mesentery of the small bowel was densely adherent. No evidence of small bowel injury at this process. We elected to divide the colon just proximal to the disease segment of sigmoid colon with a GIA 75 stapling device. The mesentery was extremely dense and woody from inflammation. We used a LigaSure and 2-0 Vicryl ties to oversew the mesentery very close to the colon wall to avoid the ureter due to the severe inflammation which appeared chronic in the left lower quadrant. We mobilized the colon from the back wall the uterus and fallopian tube. We identified the rectosigmoid junction which was soft and non-disease. This was divided with a second load of the GIA 75 stapling device. Specimen passed off the field. Examination of the left ureter is difficult do to the severe inflammation and phlegmon. There is no evidence of injury to left ureter. Indigo carmine was given intravenously. There is no evidence of any dye in the abdominal cavity and the urine became blue. The pelvis was irrigated. We examine the proximal colon and it appeared healthy. A pursestring suture was placed the proximal colon a 2-0 Prolene. EEA staplers and sizers from the field and a 33 mm anvil fit well. The suture line was cut and the embolus placed the proximal colon and tied  down with the 2-0 Prolene. The distal stump was examined and found to be hemostatic. I went below and performed a rectal examination and gentle dilation the anal canal. I advanced the 33 mm dilator through the anal canal to the rectal stump without any resistance. I then placed a 33 EEA stapler correct. I placed 2 fingers into the vaginal vault prevent insertion. Stable was advanced to the staple line of the rectum without difficulty. The spike was deployed in the interval gauged. This was closed down but difficulty and fired. 2 complete donuts were removed. Rigid sigmoidoscopy was performed with insufflation with no evidence of leakage at the staple line. The anastomosis was intact with no bleeding. We then removed the scope decompressing the rectum and sigmoid colon. The quality of bowel prep was poor.  Gowns and gloves were changed. The pelvis is irrigated. The anastomosis was examined and there is no tension on the anastomosis. Through separate stab incision 19 round drain was placed in the pelvis and secured to the skin with 2-0 nylon. Given the amount of contamination, I felt that wound VAC closure the skin with the best. Hemostasis was excellent prior to closing. The fascia was closed with running #1 PDS suture. The umbilicus was secured down to the fascia with 3-0 Monocryl. Wound VAC was applied to the wound and placed to 125 mm suction with good seal. The port sites are closed with 3-0 Monocryl. All final counts sponge, instruments and needles found to be  correct. The patient was awoke, extubated taken recovery in satisfactory condition..       Findings: severe chronic diverticulitis  Estimated Blood Loss: 300 mL         Drains: 19 French  round         Total IV Fluids: 3000 mL         Specimens: sigmoid colon          Complications: None; patient tolerated the procedure well.         Disposition: PACU - hemodynamically stable.         Condition: stable

## 2011-12-26 NOTE — Transfer of Care (Signed)
Immediate Anesthesia Transfer of Care Note  Patient: Natasha Barrera  Procedure(s) Performed: Procedure(s) (LRB) with comments: LAPAROSCOPIC SIGMOID COLECTOMY (N/A) - Laparoscopic assissted sigmoid colectomy  Patient Location: PACU  Anesthesia Type: General  Level of Consciousness: awake, oriented and patient cooperative  Airway & Oxygen Therapy: Patient Spontanous Breathing and Patient connected to nasal cannula oxygen  Post-op Assessment: Report given to PACU RN, Post -op Vital signs reviewed and stable and Patient moving all extremities X 4  Post vital signs: Reviewed and stable  Complications: No apparent anesthesia complications

## 2011-12-26 NOTE — Anesthesia Postprocedure Evaluation (Signed)
  Anesthesia Post-op Note  Patient: Natasha Barrera  Procedure(s) Performed: Procedure(s) (LRB) with comments: LAPAROSCOPIC SIGMOID COLECTOMY (N/A) - Laparoscopic assissted sigmoid colectomy  Patient Location: PACU  Anesthesia Type: General  Level of Consciousness: awake, alert , oriented and patient cooperative  Airway and Oxygen Therapy: Patient Spontanous Breathing and Patient connected to nasal cannula oxygen  Post-op Pain: mild  Post-op Assessment: Post-op Vital signs reviewed, Patient's Cardiovascular Status Stable, Respiratory Function Stable, Patent Airway, No signs of Nausea or vomiting and Pain level controlled  Post-op Vital Signs: Reviewed and stable  Complications: No apparent anesthesia complications

## 2011-12-27 ENCOUNTER — Encounter (HOSPITAL_COMMUNITY): Payer: Self-pay | Admitting: General Practice

## 2011-12-27 LAB — BASIC METABOLIC PANEL
CO2: 29 mEq/L (ref 19–32)
Chloride: 99 mEq/L (ref 96–112)
Glucose, Bld: 123 mg/dL — ABNORMAL HIGH (ref 70–99)
Potassium: 3.9 mEq/L (ref 3.5–5.1)
Sodium: 135 mEq/L (ref 135–145)

## 2011-12-27 LAB — CBC
HCT: 29.3 % — ABNORMAL LOW (ref 36.0–46.0)
MCH: 31.4 pg (ref 26.0–34.0)
MCHC: 33.4 g/dL (ref 30.0–36.0)
RDW: 14.1 % (ref 11.5–15.5)

## 2011-12-27 MED ORDER — BIOTENE DRY MOUTH MT LIQD
15.0000 mL | Freq: Two times a day (BID) | OROMUCOSAL | Status: DC
  Administered 2011-12-27 – 2011-12-30 (×8): 15 mL via OROMUCOSAL

## 2011-12-27 MED ORDER — BIOTENE DRY MOUTH MT LIQD: 15.0000 mL | Freq: Two times a day (BID) | OROMUCOSAL | Status: DC

## 2011-12-27 MED ORDER — CHLORHEXIDINE GLUCONATE 0.12 % MT SOLN: 15.0000 mL | Freq: Two times a day (BID) | OROMUCOSAL | Status: DC

## 2011-12-27 MED ORDER — CHLORHEXIDINE GLUCONATE 0.12 % MT SOLN
15.0000 mL | Freq: Two times a day (BID) | OROMUCOSAL | Status: DC
  Administered 2011-12-27 – 2011-12-30 (×7): 15 mL via OROMUCOSAL
  Filled 2011-12-27 (×5): qty 15

## 2011-12-27 NOTE — Progress Notes (Signed)
1 Day Post-Op  Subjective: PT SORE BUT OK.  Objective: Vital signs in last 24 hours: Temp:  [97.9 F (36.6 C)-99 F (37.2 C)] 98.1 F (36.7 C) (09/26 0620) Pulse Rate:  [65-122] 79  (09/26 0620) Resp:  [10-20] 18  (09/26 0800) BP: (100-134)/(54-82) 106/56 mmHg (09/26 0620) SpO2:  [96 %-100 %] 98 % (09/26 0620) Last BM Date: 12/26/11  Intake/Output from previous day: 09/25 0701 - 09/26 0700 In: 5454.2 [I.V.:5454.2] Out: 2530 [Urine:1995; Drains:235; Blood:300] Intake/Output this shift:    Incision/Wound:VAC IN PLACE FLAT NON DISTENDED JP SEROUS   Lab Results:   Basename 12/27/11 0700 12/26/11 1513  WBC 12.9* 17.0*  HGB 9.8* 10.6*  HCT 29.3* 31.6*  PLT 349 374   BMET  Basename 12/27/11 0700 12/26/11 0651  NA 135 137  K 3.9 4.3  CL 99 99  CO2 29 26  GLUCOSE 123* 93  BUN 5* 6  CREATININE 0.41* 0.49*  CALCIUM 8.9 10.0   PT/INR No results found for this basename: LABPROT:2,INR:2 in the last 72 hours ABG No results found for this basename: PHART:2,PCO2:2,PO2:2,HCO3:2 in the last 72 hours  Studies/Results: No results found.  Anti-infectives: Anti-infectives     Start     Dose/Rate Route Frequency Ordered Stop   12/26/11 0730   ertapenem (INVANZ) 1 g in sodium chloride 0.9 % 50 mL IVPB        1 g 100 mL/hr over 30 Minutes Intravenous  Once 12/26/11 0700 12/26/11 0910   12/25/11 1415   cefTRIAXone (ROCEPHIN) 2 g in dextrose 5 % 50 mL IVPB        2 g 100 mL/hr over 30 Minutes Intravenous  Once 12/25/11 1403 12/26/11 0830   12/25/11 1403   metroNIDAZOLE (FLAGYL) IVPB 500 mg        500 mg 100 mL/hr over 60 Minutes Intravenous 60 min pre-op 12/25/11 1403 12/26/11 0850          Assessment/Plan: s/p Procedure(s) (LRB) with comments: LAPAROSCOPIC SIGMOID COLECTOMY (N/A) - Laparoscopic assissted sigmoid colectomy Advance diet Follow H/H  Drain appears serosanguinous.  Hold lovanox for 24 hours given bleeding risk. OOB  LOS: 1 day    Natasha Barrera  A. 12/27/2011

## 2011-12-27 NOTE — Progress Notes (Signed)
UR complete 

## 2011-12-28 LAB — CBC
HCT: 26.5 % — ABNORMAL LOW (ref 36.0–46.0)
MCH: 31.3 pg (ref 26.0–34.0)
MCHC: 33.6 g/dL (ref 30.0–36.0)
Platelets: 298 10*3/uL (ref 150–400)
RBC: 2.68 MIL/uL — ABNORMAL LOW (ref 3.87–5.11)
RDW: 14.4 % (ref 11.5–15.5)
RDW: 14.1 % (ref 11.5–15.5)
WBC: 8 10*3/uL (ref 4.0–10.5)
WBC: 7.6 10*3/uL (ref 4.0–10.5)

## 2011-12-28 MED ORDER — HYDROMORPHONE HCL PF 1 MG/ML IJ SOLN: 1.0000 mg | INTRAMUSCULAR | Status: DC | PRN

## 2011-12-28 MED ORDER — OXYCODONE-ACETAMINOPHEN 5-325 MG PO TABS
2.0000 | ORAL_TABLET | ORAL | Status: DC | PRN
  Administered 2011-12-28 – 2011-12-31 (×14): 2 via ORAL
  Filled 2011-12-28 (×15): qty 2

## 2011-12-28 NOTE — Progress Notes (Signed)
2 Days Post-Op  Subjective: Feels  Ok a little tired  Objective: Vital signs in last 24 hours: Temp:  [97.8 F (36.6 C)-98.1 F (36.7 C)] 97.9 F (36.6 C) (09/27 0206) Pulse Rate:  [78-91] 78  (09/27 0206) Resp:  [11-20] 20  (09/27 0800) BP: (92-117)/(57-78) 92/59 mmHg (09/27 0206) SpO2:  [96 %-100 %] 96 % (09/27 0800) Last BM Date: 12/26/11  Intake/Output from previous day: 09/26 0701 - 09/27 0700 In: 2745 [P.O.:120; I.V.:2625] Out: 650 [Urine:600; Drains:50] Intake/Output this shift:    Incision/Wound:vac in place.  JP 50 cc overnight serous. Non distended flat  Lab Results:   Basename 12/28/11 0450 12/27/11 0700  WBC 8.0 12.9*  HGB 8.4* 9.8*  HCT 25.6* 29.3*  PLT 298 349   BMET  Basename 12/27/11 0700 12/26/11 0651  NA 135 137  K 3.9 4.3  CL 99 99  CO2 29 26  GLUCOSE 123* 93  BUN 5* 6  CREATININE 0.41* 0.49*  CALCIUM 8.9 10.0   PT/INR No results found for this basename: LABPROT:2,INR:2 in the last 72 hours ABG No results found for this basename: PHART:2,PCO2:2,PO2:2,HCO3:2 in the last 72 hours  Studies/Results: No results found.  Anti-infectives: Anti-infectives     Start     Dose/Rate Route Frequency Ordered Stop   12/26/11 0730   ertapenem (INVANZ) 1 g in sodium chloride 0.9 % 50 mL IVPB        1 g 100 mL/hr over 30 Minutes Intravenous  Once 12/26/11 0700 12/26/11 0910   12/25/11 1415   cefTRIAXone (ROCEPHIN) 2 g in dextrose 5 % 50 mL IVPB        2 g 100 mL/hr over 30 Minutes Intravenous  Once 12/25/11 1403 12/26/11 0830   12/25/11 1403   metroNIDAZOLE (FLAGYL) IVPB 500 mg        500 mg 100 mL/hr over 60 Minutes Intravenous 60 min pre-op 12/25/11 1403 12/26/11 0850          Assessment/Plan: s/p Procedure(s) (LRB) with comments: LAPAROSCOPIC SIGMOID COLECTOMY (N/A) - Laparoscopic assissted sigmoid colectomy Follow H/H hold lovanox due to drop in hemoglobin secondary to acute blood loss anemia Change vac Sunday Decrease IVF Advance  diet once she has flatus D/C PCA  LOS: 2 days    Mithran Strike A. 12/28/2011

## 2011-12-28 NOTE — Progress Notes (Signed)
Pt has been encouraged several times today to walk in the hall. She says that she is not ready to do that yet, she is walking in the room and sitting in the chair which she feels is enough at this time.

## 2011-12-29 DIAGNOSIS — K5732 Diverticulitis of large intestine without perforation or abscess without bleeding: Secondary | ICD-10-CM | POA: Diagnosis present

## 2011-12-29 NOTE — Progress Notes (Signed)
3 Days Post-Op   Assessment: s/p Procedure(s): LAPAROSCOPIC SIGMOID COLECTOMY Patient Active Problem List  Diagnosis  . Diverticulitis of colon    Status post partial colectomy, improving as expected Hemoglobin stable.  Plan: Advance diet  Subjective: The patient feels good. Her pain is well controlled. She is tolerating liquids with no difficulty. She's had a bowel movement. She has been ambulating well. She would like to eat some more than the clear liquids.  Objective: Vital signs in last 24 hours: Temp:  [97.4 F (36.3 C)-97.9 F (36.6 C)] 97.9 F (36.6 C) (09/28 0533) Pulse Rate:  [68-84] 78  (09/28 0533) Resp:  [18-20] 18  (09/28 0533) BP: (99-107)/(57-64) 99/60 mmHg (09/28 0533) SpO2:  [98 %-100 %] 98 % (09/28 0533)   Intake/Output from previous day: 09/27 0701 - 09/28 0700 In: 360 [P.O.:360] Out: 45 [Drains:45] Intake/Output this shift:     General appearance: alert, cooperative and no distress Resp: clear to auscultation bilaterally GI: soft and nontender. Bowel sounds present. Not distended.  Incision: dressing dry and not removed  Lab Results:   Basename 12/28/11 2245 12/28/11 0450  WBC 7.6 8.0  HGB 8.9* 8.4*  HCT 26.5* 25.6*  PLT 310 298   BMET  Basename 12/27/11 0700  NA 135  K 3.9  CL 99  CO2 29  GLUCOSE 123*  BUN 5*  CREATININE 0.41*  CALCIUM 8.9   PT/INR No results found for this basename: LABPROT:2,INR:2 in the last 72 hours ABG No results found for this basename: PHART:2,PCO2:2,PO2:2,HCO3:2 in the last 72 hours  MEDS, Scheduled    . alvimopan  12 mg Oral BID  . antiseptic oral rinse  15 mL Mouth Rinse q12n4p  . chlorhexidine  15 mL Mouth Rinse BID    Studies/Results: No results found.    LOS: 3 days     Haywood Lasso, MD, [facility], [place]   12/29/2011 11:46 AM

## 2011-12-30 NOTE — Progress Notes (Signed)
Agree with assessment and plan of Natasha Barrera, [place]. I anticipate she can go home tomorrow. Her abdomen is soft and benign.

## 2011-12-30 NOTE — Progress Notes (Signed)
Patient ID: Natasha Barrera, female   DOB: 09/05/1965, 46 y.o.   MRN: [id number] 4 Days Post-Op  Subjective: Feels ok, tolerating diet, small bms, denies n/v  Objective: Vital signs in last 24 hours: Temp:  [97.5 F (36.4 C)-98.4 F (36.9 C)] 97.5 F (36.4 C) (09/29 0559) Pulse Rate:  [69-79] 79  (09/29 0559) Resp:  [14-16] 16  (09/29 0559) BP: (96-104)/(54-57) 102/55 mmHg (09/29 0559) SpO2:  [94 %-100 %] 98 % (09/29 0559) Last BM Date: 12/26/11  Intake/Output from previous day: 09/28 0701 - 09/29 0700 In: 480 [P.O.:480] Out: 50 [Drains:50] Intake/Output this shift:    Incision/Wound:vac in place.  JP 50cc/24hrs Abd: soft, mildly tender, +BS  Lab Results:   Basename 12/28/11 2245 12/28/11 0450  WBC 7.6 8.0  HGB 8.9* 8.4*  HCT 26.5* 25.6*  PLT 310 298   BMET No results found for this basename: NA:2,K:2,CL:2,CO2:2,GLUCOSE:2,BUN:2,CREATININE:2,CALCIUM:2 in the last 72 hours PT/INR No results found for this basename: LABPROT:2,INR:2 in the last 72 hours ABG No results found for this basename: PHART:2,PCO2:2,PO2:2,HCO3:2 in the last 72 hours  Studies/Results: No results found.  Anti-infectives: Anti-infectives     Start     Dose/Rate Route Frequency Ordered Stop   12/26/11 0730   ertapenem (INVANZ) 1 g in sodium chloride 0.9 % 50 mL IVPB        1 g 100 mL/hr over 30 Minutes Intravenous  Once 12/26/11 0700 12/26/11 0910   12/25/11 1415   cefTRIAXone (ROCEPHIN) 2 g in dextrose 5 % 50 mL IVPB        2 g 100 mL/hr over 30 Minutes Intravenous  Once 12/25/11 1403 12/26/11 0830   12/25/11 1403   metroNIDAZOLE (FLAGYL) IVPB 500 mg        500 mg 100 mL/hr over 60 Minutes Intravenous 60 min pre-op 12/25/11 1403 12/26/11 0850          Assessment/Plan: s/p Procedure(s) (LRB) with comments: LAPAROSCOPIC SIGMOID COLECTOMY (N/A) - Laparoscopic assissted sigmoid colectomy  --soft diet for dinner   --will change wound vac tomorrow as pt may go home tomorrow  --hopefully  home tomorrow  --will leave JP drain for now     LOS: 4 days    Barz, Clete Kuch 12/30/2011

## 2011-12-31 MED ORDER — POLYETHYLENE GLYCOL 3350 17 GM/SCOOP PO POWD: 17.0000 g | Freq: Every day | ORAL | Status: DC

## 2011-12-31 MED ORDER — OXYCODONE-ACETAMINOPHEN 5-325 MG PO TABS: 2.0000 | ORAL_TABLET | ORAL | Status: DC | PRN

## 2011-12-31 NOTE — Plan of Care (Signed)
Problem: Discharge Progression Outcomes Goal: Tubes and drains discontinued if indicated Outcome: Not Met (add Reason) Pt being discharged with JP drain

## 2011-12-31 NOTE — Progress Notes (Signed)
Discharge instructions/Med Rec Sheet reviewed w/ pt. Pt expressed understanding and copies given w/ prescriptions. Pt d/c'd in stable condition via w/c, accompanied by discharge volunteers

## 2011-12-31 NOTE — Discharge Summary (Signed)
Physician Discharge Summary  Patient ID: Natasha Barrera MRN: [id number] DOB/AGE: 1965-09-27 46 y.o.  Admit date: 12/26/2011 Discharge date: 12/31/2011  Admission Diagnoses:diverticulitis chronic  Discharge Diagnoses: same Principal Problem:  *Diverticulitis of colon   Discharged Condition: good  Hospital Course: unremarkable.  Bowel function returned on day 3.  Hgb stabilized at 8.4and 8.9 on day 5.  Bowels functioning.  Vac removed day 5 with clean wound. JP serous.    Consults: None  Significant Diagnostic Studies: labs:  CBC    Component Value Date/Time   WBC 7.6 12/28/2011 2245   RBC 2.81* 12/28/2011 2245   HGB 8.9* 12/28/2011 2245   HCT 26.5* 12/28/2011 2245   PLT 310 12/28/2011 2245   MCV 94.3 12/28/2011 2245   MCH 31.7 12/28/2011 2245   MCHC 33.6 12/28/2011 2245   RDW 14.1 12/28/2011 2245   LYMPHSABS 2.3 12/26/2011 0651   MONOABS 0.6 12/26/2011 0651   EOSABS 0.1 12/26/2011 0651   BASOSABS 0.0 12/26/2011 0651     Treatments: surgery: lap assisted sigmoid colectomy  Discharge Exam: Blood pressure 95/56, pulse 74, temperature 97.8 F (36.6 C), temperature source Oral, resp. rate 18, height 5' 4"  (1.626 m), weight 111 lb 4.8 oz (50.485 kg), last menstrual period 11/26/2011, SpO2 97.00%. Incision/Wound:clean with good granulation tissue.  Soft and flat.  JP serous.  No peritonitis.  Disposition: 01-Home or Self Care  Discharge Orders    Future Appointments: Provider: Department: Dept Phone: Center:   01/07/2012 10:00 AM Marcello Moores A. Amaka Gluth, MD [facility] [phone] None     Future Orders Please Complete By Expires   Diet - low sodium heart healthy      Increase activity slowly      Discharge instructions      Comments:   No lifting.  No driving.  Change dressing twice a day.  Shower.  Wet to dry dressing.  Drain out Friday.  Office will call to set up.  Soft diet .  No fiber.       Medication List     As of 12/31/2011  6:41 AM    STOP taking these medications          oxyCODONE 5 MG immediate release tablet   Commonly known as: Oxy IR/ROXICODONE      TAKE these medications         oxyCODONE-acetaminophen 5-325 MG per tablet   Commonly known as: PERCOCET/ROXICET   Take 2 tablets by mouth every 4 (four) hours as needed.      polyethylene glycol powder powder   Commonly known as: GLYCOLAX/MIRALAX   Take 17 g by mouth daily.         Signed: Birgit Nowling A. 12/31/2011, 6:41 AM

## 2012-01-04 ENCOUNTER — Other Ambulatory Visit (INDEPENDENT_AMBULATORY_CARE_PROVIDER_SITE_OTHER): Payer: Self-pay

## 2012-01-04 ENCOUNTER — Ambulatory Visit (INDEPENDENT_AMBULATORY_CARE_PROVIDER_SITE_OTHER): Payer: 59

## 2012-01-04 DIAGNOSIS — G8918 Other acute postprocedural pain: Secondary | ICD-10-CM

## 2012-01-04 DIAGNOSIS — Z4889 Encounter for other specified surgical aftercare: Secondary | ICD-10-CM

## 2012-01-04 DIAGNOSIS — Z4803 Encounter for change or removal of drains: Secondary | ICD-10-CM

## 2012-01-04 MED ORDER — OXYCODONE-ACETAMINOPHEN 5-325 MG PO TABS: 2.0000 | ORAL_TABLET | ORAL | Status: DC | PRN

## 2012-01-04 NOTE — Progress Notes (Signed)
Patient came in for drain removal 9 days s/p sigmoid colectomy. Patient had midline wound that is healing well. I applied a wet to dry dressing on the area. Her drain has been draining 5cc and less for 3 days. I applied a dry guaze over drain site incase area drains. Patient will come in Monday for follow up with Dr. Brantley Stage

## 2012-01-07 ENCOUNTER — Encounter (INDEPENDENT_AMBULATORY_CARE_PROVIDER_SITE_OTHER): Payer: Self-pay | Admitting: Surgery

## 2012-01-07 ENCOUNTER — Ambulatory Visit (INDEPENDENT_AMBULATORY_CARE_PROVIDER_SITE_OTHER): Payer: 59 | Admitting: Surgery

## 2012-01-07 VITALS — BP 100/60 | HR 80 | Temp 97.0°F | Resp 16 | Ht 64.0 in | Wt 107.8 lb

## 2012-01-07 DIAGNOSIS — Z9889 Other specified postprocedural states: Secondary | ICD-10-CM

## 2012-01-07 NOTE — Patient Instructions (Signed)
OK to shower.  Strips will fall off on their own.  Keep dry gauze on incision.  Return 2 weks

## 2012-01-07 NOTE — Progress Notes (Signed)
NAME: Natasha Barrera                                            DOB: [birth year] DATE: 01/07/2012                                                  MRN: [id number]  CC: Post op   HPI: This patient comes in for post op follow-up .Sheunderwent lap assisted sigmoid colectomy for diverticulitis on 12/26/2011 . She feels that she is doing well.  PE:  VITAL SIGNS: BP 100/60  Pulse 80  Temp 97 F (36.1 C) (Temporal)  Resp 16  Ht 5' 4"  (1.626 m)  Wt 107 lb 12.8 oz (48.898 kg)  BMI 18.50 kg/m2  LMP 11/25/2011  General: The patient appears to be healthy, NAD Good BM incision steristripped today.  Abdomen soft nondistended.  DATA REVIEWED: DIVERTICULAR DISEASE WITH TRANSMURAL DEFECT  IMPRESSION: The patient is doing well S/P lap assisted sigmoid colectomy    PLAN: Return 2 - 3 weeks.  Continue soft diet.  No lifting.  Ok to shower over steristrips.

## 2012-01-29 ENCOUNTER — Encounter (INDEPENDENT_AMBULATORY_CARE_PROVIDER_SITE_OTHER): Payer: Self-pay | Admitting: Surgery

## 2012-01-29 ENCOUNTER — Ambulatory Visit (INDEPENDENT_AMBULATORY_CARE_PROVIDER_SITE_OTHER): Payer: 59 | Admitting: Surgery

## 2012-01-29 VITALS — BP 110/66 | HR 102 | Temp 97.7°F | Ht 64.0 in | Wt 113.8 lb

## 2012-01-29 DIAGNOSIS — Z9889 Other specified postprocedural states: Secondary | ICD-10-CM

## 2012-01-29 NOTE — Progress Notes (Signed)
NAME: Natasha Barrera                                            DOB: Jan 21, 1966 DATE: 01/29/2012                                                  MRN: [id number]  CC: Post op   HPI: This patient comes in for post op follow-up .Sheunderwent lap assisted sigmoid colectomy for diverticulitis on 12/26/2011 . She feels that she is doing well.  PE:  VITAL SIGNS: BP 110/66  Pulse 102  Temp 97.7 F (36.5 C) (Temporal)  Ht 5' 4"  (1.626 m)  Wt 113 lb 12.8 oz (51.619 kg)  BMI 19.53 kg/m2  SpO2 98%  General: The patient appears to be healthy, NAD Incision clean dry intact.  Abdomen soft nondistended.  DATA REVIEWED: DIVERTICULAR DISEASE WITH TRANSMURAL DEFECT  IMPRESSION: The patient is doing well S/P lap assisted sigmoid colectomy    PLAN: Resume high fiber diet.  No restrictions. Return as needed

## 2012-01-29 NOTE — Patient Instructions (Signed)
Fiber Content in Foods Drinking plenty of fluids and consuming foods high in fiber can help with constipation. See the list below for the fiber content of some common foods. Starches and Grains / Dietary Fiber (g)  Cheerios, 1 cup / 3 g  Kellogg's Corn Flakes, 1 cup / 0.7 g  Rice Krispies, 1  cup / 0.3 g  Quaker Oat Life Cereal,  cup / 2.1 g  Oatmeal, instant (cooked),  cup / 2 g  Kellogg's Frosted Mini Wheats, 1 cup / 5.1 g  Rice, brown, long-grain (cooked), 1 cup / 3.5 g  Rice, Stakes, long-grain (cooked), 1 cup / 0.6 g  Macaroni, cooked, enriched, 1 cup / 2.5 g Legumes / Dietary Fiber (g)  Beans, baked, canned, plain or vegetarian,  cup / 5.2 g  Beans, kidney, canned,  cup / 6.8 g  Beans, pinto, dried (cooked),  cup / 7.7 g  Beans, pinto, canned,  cup / 5.5 g Breads and Crackers / Dietary Fiber (g)  Graham crackers, plain or honey, 2 squares / 0.7 g  Saltine crackers, 3 squares / 0.3 g  Pretzels, plain, salted, 10 pieces / 1.8 g  Bread, whole-wheat, 1 slice / 1.9 g  Bread, Sortor, 1 slice / 0.7 g  Bread, raisin, 1 slice / 1.2 g  Bagel, plain, 3 oz / 2 g  Tortilla, flour, 1 oz / 0.9 g  Tortilla, corn, 1 small / 1.5 g  Bun, hamburger or hotdog, 1 small / 0.9 g Fruits / Dietary Fiber (g)  Apple, raw with skin, 1 medium / 4.4 g  Applesauce, sweetened,  cup / 1.5 g  Banana,  medium / 1.5 g  Grapes, 10 grapes / 0.4 g  Orange, 1 small / 2.3 g  Raisin, 1.5 oz / 1.6 g  Melon, 1 cup / 1.4 g Vegetables / Dietary Fiber (g)  Green beans, canned,  cup / 1.3 g  Carrots (cooked),  cup / 2.3 g  Broccoli (cooked),  cup / 2.8 g  Peas, frozen (cooked),  cup / 4.4 g  Potatoes, mashed,  cup / 1.6 g  Lettuce, 1 cup / 0.5 g  Corn, canned,  cup / 1.6 g  Tomato,  cup / 1.1 g Document Released: 08/05/2006 Document Revised: 06/11/2011 Document Reviewed: 09/30/2006 [facility] Patient Information 2013 [place], [place].

## 2015-01-24 ENCOUNTER — Other Ambulatory Visit: Payer: Self-pay

## 2015-01-24 DIAGNOSIS — Z1231 Encounter for screening mammogram for malignant neoplasm of breast: Secondary | ICD-10-CM

## 2015-02-03 ENCOUNTER — Ambulatory Visit
Admission: RE | Admit: 2015-02-03 | Discharge: 2015-02-03 | Disposition: A | Discharge status: Home / Self Care | Payer: 59 | Source: Ambulatory Visit

## 2015-02-03 DIAGNOSIS — Z1231 Encounter for screening mammogram for malignant neoplasm of breast: Secondary | ICD-10-CM

## 2016-05-24 ENCOUNTER — Other Ambulatory Visit: Payer: Self-pay | Admitting: Family Medicine

## 2016-05-24 DIAGNOSIS — Z1231 Encounter for screening mammogram for malignant neoplasm of breast: Secondary | ICD-10-CM

## 2016-06-14 ENCOUNTER — Ambulatory Visit
Admission: RE | Admit: 2016-06-14 | Discharge: 2016-06-14 | Disposition: A | Discharge status: Home / Self Care | Payer: 59 | Source: Ambulatory Visit | Attending: Family Medicine | Admitting: Family Medicine

## 2016-06-14 DIAGNOSIS — Z1231 Encounter for screening mammogram for malignant neoplasm of breast: Secondary | ICD-10-CM

## 2017-05-31 ENCOUNTER — Other Ambulatory Visit: Payer: Self-pay | Admitting: Family Medicine

## 2017-05-31 DIAGNOSIS — Z1231 Encounter for screening mammogram for malignant neoplasm of breast: Secondary | ICD-10-CM

## 2017-06-19 ENCOUNTER — Ambulatory Visit
Admission: RE | Admit: 2017-06-19 | Discharge: 2017-06-19 | Disposition: A | Discharge status: Home / Self Care | Payer: 59 | Source: Ambulatory Visit | Attending: Family Medicine | Admitting: Family Medicine

## 2017-06-19 DIAGNOSIS — Z1231 Encounter for screening mammogram for malignant neoplasm of breast: Secondary | ICD-10-CM

## 2017-12-25 ENCOUNTER — Other Ambulatory Visit: Payer: Self-pay | Admitting: Obstetrics and Gynecology

## 2017-12-25 DIAGNOSIS — R1904 Left lower quadrant abdominal swelling, mass and lump: Secondary | ICD-10-CM

## 2017-12-30 ENCOUNTER — Ambulatory Visit
Admission: RE | Admit: 2017-12-30 | Discharge: 2017-12-30 | Disposition: A | Discharge status: Home / Self Care | Payer: 59 | Source: Ambulatory Visit | Attending: Obstetrics and Gynecology | Admitting: Obstetrics and Gynecology

## 2017-12-30 DIAGNOSIS — R1904 Left lower quadrant abdominal swelling, mass and lump: Secondary | ICD-10-CM

## 2018-01-08 ENCOUNTER — Other Ambulatory Visit: Payer: Self-pay | Admitting: Obstetrics and Gynecology

## 2018-01-08 DIAGNOSIS — K439 Ventral hernia without obstruction or gangrene: Secondary | ICD-10-CM

## 2018-01-15 ENCOUNTER — Other Ambulatory Visit: Payer: 59

## 2018-11-20 ENCOUNTER — Ambulatory Visit: Payer: Self-pay | Admitting: General Surgery

## 2018-11-27 ENCOUNTER — Encounter (HOSPITAL_COMMUNITY)
Admission: RE | Admit: 2018-11-27 | Discharge: 2018-11-27 | Disposition: A | Discharge status: Home / Self Care | Payer: 59 | Source: Ambulatory Visit | Attending: General Surgery | Admitting: General Surgery

## 2018-11-27 ENCOUNTER — Other Ambulatory Visit: Payer: Self-pay

## 2018-11-27 ENCOUNTER — Encounter (HOSPITAL_COMMUNITY): Payer: Self-pay

## 2018-11-27 ENCOUNTER — Ambulatory Visit: Payer: Self-pay | Admitting: General Surgery

## 2018-11-27 DIAGNOSIS — Z01818 Encounter for other preprocedural examination: Secondary | ICD-10-CM | POA: Diagnosis not present

## 2018-11-27 HISTORY — DX: Essential (primary) hypertension: I10

## 2018-11-27 LAB — CBC
HCT: 44.7 % (ref 36.0–46.0)
Hemoglobin: 15 g/dL (ref 12.0–15.0)
MCH: 36.5 pg — ABNORMAL HIGH (ref 26.0–34.0)
MCHC: 33.6 g/dL (ref 30.0–36.0)
MCV: 108.8 fL — ABNORMAL HIGH (ref 80.0–100.0)
Platelets: 246 10*3/uL (ref 150–400)
RBC: 4.11 MIL/uL (ref 3.87–5.11)
RDW: 12.4 % (ref 11.5–15.5)
WBC: 7.5 10*3/uL (ref 4.0–10.5)
nRBC: 0 % (ref 0.0–0.2)

## 2018-11-27 LAB — BASIC METABOLIC PANEL
Anion gap: 10 (ref 5–15)
BUN: <5 mg/dL — ABNORMAL LOW (ref 6–20)
CO2: 27 mmol/L (ref 22–32)
Calcium: 9.5 mg/dL (ref 8.9–10.3)
Chloride: 96 mmol/L — ABNORMAL LOW (ref 98–111)
Creatinine, Ser: 0.66 mg/dL (ref 0.44–1.00)
GFR calc Af Amer: >60 mL/min (ref >60)
GFR calc non Af Amer: >60 mL/min (ref >60)
Glucose, Bld: 97 mg/dL (ref 70–99)
Potassium: 3.6 mmol/L (ref 3.5–5.1)
Sodium: 133 mmol/L — ABNORMAL LOW (ref 135–145)

## 2018-11-27 NOTE — Pre-Procedure Instructions (Addendum)
Ariell D Gaspari  11/27/2018    Your procedure is scheduled on Friday, September 4, 20220 at  10:00 AM.   Report to [facility] Entrance "A" Admitting Office at 8:00 AM.   Call this number if you have problems the morning of surgery: [phone]   Questions prior to day of surgery, please call [phone] between 8 & 4 PM.   Remember:  Do not eat food after midnight Thursday, 12/04/18.  You may drink clear liquids until 7:00 AM.  Clear liquids allowed are:  Water, Juice (non-citric and without pulp), Carbonated beverages, Clear Tea, Black Coffee only, Plain Jell-O only, Gatorade and Plain Popsicles only    Take these medicines the morning of surgery with A SIP OF WATER: NONE  Do not use Aspirin products (Goody's, BC Powders, etc), NSAIDS (Ibuprofen, Aleve, etc), Multivitamins or Herbal medications 7 days prior to surgery.    Do not wear jewelry, make-up or nail polish.  Do not wear lotions, powders, perfumes or deodorant.  Do not shave 48 hours prior to surgery.    Do not bring valuables to the hospital.  [facility] is not responsible for any belongings or valuables.  Contacts, dentures or bridgework may not be worn into surgery.  Leave your suitcase in the car.  After surgery it may be brought to your room.  For patients admitted to the hospital, discharge time will be determined by your treatment team.  Patients discharged the day of surgery will not be allowed to drive home.   [place] - Preparing for Surgery  Before surgery, you can play an important role.  Because skin is not sterile, your skin needs to be as free of germs as possible.  You can reduce the number of germs on you skin by washing with CHG (chlorahexidine gluconate) soap before surgery.  CHG is an antiseptic cleaner which kills germs and bonds with the skin to continue killing germs even after washing.  Oral Hygiene is also important in reducing the risk of infection.  Remember to brush your teeth with  your regular toothpaste the morning of surgery.  Please DO NOT use if you have an allergy to CHG or antibacterial soaps.  If your skin becomes reddened/irritated stop using the CHG and inform your nurse when you arrive at Short Stay.  Do not shave (including legs and underarms) for at least 48 hours prior to the first CHG shower.  You may shave your face.  Please follow these instructions carefully:   1.  Shower with CHG Soap the night before surgery and the morning of Surgery.  2.  If you choose to wash your hair, wash your hair first as usual with your normal shampoo.  3.  After you shampoo, rinse your hair and body thoroughly to remove the shampoo. 4.  Use CHG as you would any other liquid soap.  You can apply chg directly to the skin and wash gently with a      scrungie or washcloth.           5.  Apply the CHG Soap to your body ONLY FROM THE NECK DOWN.   Do not use on open wounds or open sores. Avoid contact with your eyes, ears, mouth and genitals (private parts).  Wash genitals (private parts) with your normal soap - do this prior to using CHG soap.  6.  Wash thoroughly, paying special attention to the area where your surgery will be performed.  7.  Thoroughly rinse  your body with warm water from the neck down.  8.  DO NOT shower/wash with your normal soap after using and rinsing off the CHG Soap.  9.  Pat yourself dry with a clean towel.            10.  Wear clean pajamas.            11.  Place clean sheets on your bed the night of your first shower and do not sleep with pets.  Day of Surgery  Shower as above. Do not apply any lotions/deodorants the morning of surgery.   Please wear clean clothes to the hospital. Remember to brush your teeth with toothpaste.   Please read over the fact sheets that you were given.

## 2018-11-27 NOTE — Progress Notes (Signed)
PCP - Dr. Durenda Hurt Cardiologist - denies  Chest x-ray - N/A EKG - today Stress Test - denies ECHO - denies Cardiac Cath - denies  Sleep Study - N/A CPAP -   Fasting Blood Sugar - N/A Checks Blood Sugar _____ times a day  Blood Thinner Instructions: N/A Aspirin Instructions: N/A  Anesthesia review: No  Patient denies shortness of breath, fever, cough and chest pain at PAT appointment   Patient verbalized understanding of instructions that were given to them at the PAT appointment. Patient was also instructed that they will need to review over the PAT instructions again at home before surgery.   Pt will have Covid test done 12/02/18. She was instructed on being quarantine once she has the test done. She voiced understanding.

## 2018-12-02 ENCOUNTER — Other Ambulatory Visit (HOSPITAL_COMMUNITY)
Admission: RE | Admit: 2018-12-02 | Discharge: 2018-12-02 | Disposition: A | Discharge status: Home / Self Care | Payer: 59 | Source: Ambulatory Visit | Attending: General Surgery | Admitting: General Surgery

## 2018-12-02 DIAGNOSIS — Z01812 Encounter for preprocedural laboratory examination: Secondary | ICD-10-CM | POA: Insufficient documentation

## 2018-12-02 DIAGNOSIS — Z20828 Contact with and (suspected) exposure to other viral communicable diseases: Secondary | ICD-10-CM | POA: Insufficient documentation

## 2018-12-02 LAB — SARS CORONAVIRUS 2 (TAT 6-24 HRS): SARS Coronavirus 2: NEGATIVE

## 2018-12-05 ENCOUNTER — Ambulatory Visit (HOSPITAL_COMMUNITY): Payer: 59 | Admitting: Anesthesiology

## 2018-12-05 ENCOUNTER — Encounter (HOSPITAL_COMMUNITY)
Admission: RE | Disposition: A | Discharge status: Home / Self Care | Payer: Self-pay | Source: Home / Self Care | Attending: General Surgery

## 2018-12-05 ENCOUNTER — Other Ambulatory Visit: Payer: Self-pay

## 2018-12-05 ENCOUNTER — Ambulatory Visit (HOSPITAL_COMMUNITY)
Admission: RE | Admit: 2018-12-05 | Discharge: 2018-12-05 | Disposition: A | Discharge status: Home / Self Care | Payer: 59 | Attending: General Surgery | Admitting: General Surgery

## 2018-12-05 ENCOUNTER — Encounter (HOSPITAL_COMMUNITY): Payer: Self-pay

## 2018-12-05 DIAGNOSIS — I10 Essential (primary) hypertension: Secondary | ICD-10-CM | POA: Diagnosis not present

## 2018-12-05 DIAGNOSIS — K439 Ventral hernia without obstruction or gangrene: Secondary | ICD-10-CM | POA: Insufficient documentation

## 2018-12-05 DIAGNOSIS — Z87891 Personal history of nicotine dependence: Secondary | ICD-10-CM | POA: Diagnosis not present

## 2018-12-05 DIAGNOSIS — Z79899 Other long term (current) drug therapy: Secondary | ICD-10-CM | POA: Insufficient documentation

## 2018-12-05 HISTORY — PX: VENTRAL HERNIA REPAIR: SHX424

## 2018-12-05 SURGERY — REPAIR, HERNIA, VENTRAL, LAPAROSCOPIC
Anesthesia: General | Site: Abdomen

## 2018-12-05 MED ORDER — FENTANYL CITRATE (PF) 100 MCG/2ML IJ SOLN
INTRAMUSCULAR | Status: AC
  Filled 2018-12-05: qty 2

## 2018-12-05 MED ORDER — ONDANSETRON HCL 4 MG/2ML IJ SOLN
INTRAMUSCULAR | Status: DC | PRN
  Administered 2018-12-05: 4 mg via INTRAVENOUS

## 2018-12-05 MED ORDER — METHOCARBAMOL 750 MG PO TABS: 750.0000 mg | ORAL_TABLET | Freq: Four times a day (QID) | ORAL | 2 refills | Status: DC | PRN

## 2018-12-05 MED ORDER — 0.9 % SODIUM CHLORIDE (POUR BTL) OPTIME
TOPICAL | Status: DC | PRN
  Administered 2018-12-05: 11:00:00 1000 mL

## 2018-12-05 MED ORDER — LIDOCAINE IN D5W 4-5 MG/ML-% IV SOLN
1.0000 mg/min | INTRAVENOUS | Status: AC
  Administered 2018-12-05: 25 ug/kg/min via INTRAVENOUS
  Filled 2018-12-05: qty 500

## 2018-12-05 MED ORDER — DEXAMETHASONE SODIUM PHOSPHATE 10 MG/ML IJ SOLN
INTRAMUSCULAR | Status: DC | PRN
  Administered 2018-12-05: 10 mg via INTRAVENOUS

## 2018-12-05 MED ORDER — CELECOXIB 200 MG PO CAPS
200.0000 mg | ORAL_CAPSULE | ORAL | Status: AC
  Administered 2018-12-05: 200 mg via ORAL
  Filled 2018-12-05: qty 1

## 2018-12-05 MED ORDER — HYDROCODONE-ACETAMINOPHEN 5-325 MG PO TABS
1.0000 | ORAL_TABLET | Freq: Once | ORAL | Status: AC
  Administered 2018-12-05: 1 via ORAL

## 2018-12-05 MED ORDER — LACTATED RINGERS IV SOLN
INTRAVENOUS | Status: DC
  Administered 2018-12-05 (×2): via INTRAVENOUS

## 2018-12-05 MED ORDER — SUGAMMADEX SODIUM 200 MG/2ML IV SOLN
INTRAVENOUS | Status: DC | PRN
  Administered 2018-12-05: 125 mg via INTRAVENOUS

## 2018-12-05 MED ORDER — HYDROCODONE-ACETAMINOPHEN 5-325 MG PO TABS
ORAL_TABLET | ORAL | Status: AC
  Filled 2018-12-05: qty 1

## 2018-12-05 MED ORDER — ROCURONIUM BROMIDE 10 MG/ML (PF) SYRINGE
PREFILLED_SYRINGE | INTRAVENOUS | Status: DC | PRN
  Administered 2018-12-05: 30 mg via INTRAVENOUS
  Administered 2018-12-05: 10 mg via INTRAVENOUS

## 2018-12-05 MED ORDER — ACETAMINOPHEN 500 MG PO TABS
1000.0000 mg | ORAL_TABLET | ORAL | Status: AC
  Administered 2018-12-05: 1000 mg via ORAL
  Filled 2018-12-05: qty 2

## 2018-12-05 MED ORDER — LIDOCAINE 2% (20 MG/ML) 5 ML SYRINGE
INTRAMUSCULAR | Status: DC | PRN
  Administered 2018-12-05: 60 mg via INTRAVENOUS

## 2018-12-05 MED ORDER — GABAPENTIN 300 MG PO CAPS
300.0000 mg | ORAL_CAPSULE | ORAL | Status: AC
  Administered 2018-12-05: 08:00:00 300 mg via ORAL
  Filled 2018-12-05: qty 1

## 2018-12-05 MED ORDER — FENTANYL CITRATE (PF) 100 MCG/2ML IJ SOLN
INTRAMUSCULAR | Status: DC | PRN
  Administered 2018-12-05: 50 ug via INTRAVENOUS
  Administered 2018-12-05 (×2): 100 ug via INTRAVENOUS

## 2018-12-05 MED ORDER — BUPIVACAINE-EPINEPHRINE 0.25% -1:200000 IJ SOLN
INTRAMUSCULAR | Status: DC | PRN
  Administered 2018-12-05: 9 mL

## 2018-12-05 MED ORDER — FENTANYL CITRATE (PF) 100 MCG/2ML IJ SOLN
25.0000 ug | INTRAMUSCULAR | Status: DC | PRN
  Administered 2018-12-05 (×3): 50 ug via INTRAVENOUS

## 2018-12-05 MED ORDER — CEFAZOLIN SODIUM-DEXTROSE 2-4 GM/100ML-% IV SOLN
2.0000 g | INTRAVENOUS | Status: AC
  Administered 2018-12-05: 2 g via INTRAVENOUS
  Filled 2018-12-05: qty 100

## 2018-12-05 MED ORDER — BUPIVACAINE-EPINEPHRINE (PF) 0.25% -1:200000 IJ SOLN
INTRAMUSCULAR | Status: AC
  Filled 2018-12-05: qty 30

## 2018-12-05 MED ORDER — MIDAZOLAM HCL 5 MG/5ML IJ SOLN
INTRAMUSCULAR | Status: DC | PRN
  Administered 2018-12-05: 2 mg via INTRAVENOUS

## 2018-12-05 MED ORDER — HYDROCODONE-ACETAMINOPHEN 5-325 MG PO TABS: 1.0000 | ORAL_TABLET | Freq: Four times a day (QID) | ORAL | 0 refills | Status: DC | PRN

## 2018-12-05 MED ORDER — PROMETHAZINE HCL 25 MG/ML IJ SOLN: 6.2500 mg | INTRAMUSCULAR | Status: DC | PRN

## 2018-12-05 MED ORDER — CHLORHEXIDINE GLUCONATE CLOTH 2 % EX PADS: 6.0000 | MEDICATED_PAD | Freq: Once | CUTANEOUS | Status: DC

## 2018-12-05 MED ORDER — HYDROCODONE-ACETAMINOPHEN 5-325 MG PO TABS
1.0000 | ORAL_TABLET | Freq: Once | ORAL | Status: AC
  Administered 2018-12-05: 13:00:00 1 via ORAL

## 2018-12-05 MED ORDER — PROPOFOL 10 MG/ML IV BOLUS
INTRAVENOUS | Status: DC | PRN
  Administered 2018-12-05: 150 mg via INTRAVENOUS

## 2018-12-05 SURGICAL SUPPLY — 56 items
APL PRP STRL LF DISP 70% ISPRP (MISCELLANEOUS) ×1
BINDER ABDOMINAL 12 ML 46-62 (SOFTGOODS) ×2 IMPLANT
BNDG GAUZE ELAST 4 BULKY (GAUZE/BANDAGES/DRESSINGS) IMPLANT
CANISTER SUCT 3000ML PPV (MISCELLANEOUS) IMPLANT
CHLORAPREP W/TINT 26 (MISCELLANEOUS) ×3 IMPLANT
COVER SURGICAL LIGHT HANDLE (MISCELLANEOUS) ×3 IMPLANT
COVER WAND RF STERILE (DRAPES) ×1 IMPLANT
DERMABOND ADVANCED (GAUZE/BANDAGES/DRESSINGS) ×2
DERMABOND ADVANCED .7 DNX12 (GAUZE/BANDAGES/DRESSINGS) ×1 IMPLANT
DEVICE SECURE STRAP 25 ABSORB (INSTRUMENTS) ×3 IMPLANT
DEVICE TROCAR PUNCTURE CLOSURE (ENDOMECHANICALS) ×3 IMPLANT
DRAPE INCISE IOBAN 66X45 STRL (DRAPES) ×3 IMPLANT
DRAPE LAPAROSCOPIC ABDOMINAL (DRAPES) ×3 IMPLANT
ELECT CAUTERY BLADE 6.4 (BLADE) ×3 IMPLANT
ELECT REM PT RETURN 9FT ADLT (ELECTROSURGICAL) ×3
ELECTRODE REM PT RTRN 9FT ADLT (ELECTROSURGICAL) ×1 IMPLANT
GLOVE BIO SURGEON STRL SZ 6.5 (GLOVE) ×1 IMPLANT
GLOVE BIO SURGEON STRL SZ7.5 (GLOVE) ×3 IMPLANT
GLOVE BIO SURGEONS STRL SZ 6.5 (GLOVE) ×1
GLOVE BIOGEL PI IND STRL 6.5 (GLOVE) IMPLANT
GLOVE BIOGEL PI IND STRL 7.5 (GLOVE) IMPLANT
GLOVE BIOGEL PI INDICATOR 6.5 (GLOVE) ×4
GLOVE BIOGEL PI INDICATOR 7.5 (GLOVE) ×4
GLOVE ECLIPSE 7.0 STRL STRAW (GLOVE) ×2 IMPLANT
GLOVE SURG SS PI 6.0 STRL IVOR (GLOVE) ×2 IMPLANT
GOWN STRL REUS W/ TWL LRG LVL3 (GOWN DISPOSABLE) ×3 IMPLANT
GOWN STRL REUS W/TWL LRG LVL3 (GOWN DISPOSABLE) ×12
KIT BASIN OR (CUSTOM PROCEDURE TRAY) ×3 IMPLANT
KIT TURNOVER KIT B (KITS) ×3 IMPLANT
MARKER SKIN DUAL TIP RULER LAB (MISCELLANEOUS) ×3 IMPLANT
MESH VENTRALIGHT ST 4.5IN (Mesh General) IMPLANT
MESH VENTRALIGHT ST 6IN CRC (Mesh General) ×2 IMPLANT
NDL SPNL 22GX3.5 QUINCKE BK (NEEDLE) ×1 IMPLANT
NEEDLE SPNL 22GX3.5 QUINCKE BK (NEEDLE) ×3 IMPLANT
NS IRRIG 1000ML POUR BTL (IV SOLUTION) ×3 IMPLANT
PAD ARMBOARD 7.5X6 YLW CONV (MISCELLANEOUS) ×8 IMPLANT
PENCIL BUTTON HOLSTER BLD 10FT (ELECTRODE) ×3 IMPLANT
SCISSORS LAP 5X35 DISP (ENDOMECHANICALS) ×2 IMPLANT
SET IRRIG TUBING LAPAROSCOPIC (IRRIGATION / IRRIGATOR) IMPLANT
SET TUBE SMOKE EVAC HIGH FLOW (TUBING) ×3 IMPLANT
SHEARS HARMONIC ACE PLUS 36CM (ENDOMECHANICALS) ×2 IMPLANT
SLEEVE ENDOPATH XCEL 5M (ENDOMECHANICALS) ×3 IMPLANT
SUT MNCRL AB 4-0 PS2 18 (SUTURE) ×3 IMPLANT
SUT NOVA NAB DX-16 0-1 5-0 T12 (SUTURE) ×7 IMPLANT
SUT VIC AB 0 CT1 36 (SUTURE) ×2 IMPLANT
SUT VIC AB 3-0 SH 27 (SUTURE) ×3
SUT VIC AB 3-0 SH 27XBRD (SUTURE) ×1 IMPLANT
TOWEL GREEN STERILE (TOWEL DISPOSABLE) ×3 IMPLANT
TOWEL GREEN STERILE FF (TOWEL DISPOSABLE) ×3 IMPLANT
TRAY FOLEY W/BAG SLVR 16FR (SET/KITS/TRAYS/PACK) ×3
TRAY FOLEY W/BAG SLVR 16FR ST (SET/KITS/TRAYS/PACK) ×1 IMPLANT
TRAY LAPAROSCOPIC MC (CUSTOM PROCEDURE TRAY) ×3 IMPLANT
TROCAR XCEL BLUNT TIP 100MML (ENDOMECHANICALS) IMPLANT
TROCAR XCEL NON-BLD 11X100MML (ENDOMECHANICALS) IMPLANT
TROCAR XCEL NON-BLD 5MMX100MML (ENDOMECHANICALS) ×3 IMPLANT
WATER STERILE IRR 1000ML POUR (IV SOLUTION) ×3 IMPLANT

## 2018-12-05 NOTE — Anesthesia Preprocedure Evaluation (Addendum)
Anesthesia Evaluation  Patient identified by MRN, date of birth, ID band Patient awake    Reviewed: Allergy & Precautions, NPO status , Patient's Chart, lab work & pertinent test results  Airway Mallampati: II  TM Distance: >3 FB Neck ROM: Full    Dental  (+) Dental Advisory Given, Teeth Intact   Pulmonary former smoker,    breath sounds clear to auscultation       Cardiovascular hypertension, Pt. on medications  Rhythm:Regular Rate:Normal     Neuro/Psych negative neurological ROS     GI/Hepatic negative GI ROS, Neg liver ROS,   Endo/Other  negative endocrine ROS  Renal/GU negative Renal ROS     Musculoskeletal   Abdominal   Peds  Hematology negative hematology ROS (+)   Anesthesia Other Findings   Reproductive/Obstetrics                            Lab Results  Component Value Date   WBC 7.5 11/27/2018   HGB 15.0 11/27/2018   HCT 44.7 11/27/2018   MCV 108.8 (H) 11/27/2018   PLT 246 11/27/2018   Lab Results  Component Value Date   CREATININE 0.66 11/27/2018   BUN <5 (L) 11/27/2018   NA 133 (L) 11/27/2018   K 3.6 11/27/2018   CL 96 (L) 11/27/2018   CO2 27 11/27/2018    Anesthesia Physical Anesthesia Plan  ASA: II  Anesthesia Plan: General   Post-op Pain Management:    Induction: Intravenous  PONV Risk Score and Plan: 3 and Dexamethasone, Ondansetron and Treatment may vary due to age or medical condition  Airway Management Planned: Oral ETT  Additional Equipment:   Intra-op Plan:   Post-operative Plan: Extubation in OR  Informed Consent: I have reviewed the patients History and Physical, chart, labs and discussed the procedure including the risks, benefits and alternatives for the proposed anesthesia with the patient or authorized representative who has indicated his/her understanding and acceptance.     Dental advisory given  Plan Discussed with:  CRNA  Anesthesia Plan Comments:         Anesthesia Quick Evaluation

## 2018-12-05 NOTE — Transfer of Care (Signed)
Immediate Anesthesia Transfer of Care Note  Patient: Natasha Barrera  Procedure(s) Performed: LAPAROSCOPIC VENTRAL HERNIA REPAIR WITH MESH (N/A Abdomen)  Patient Location: PACU  Anesthesia Type:General  Level of Consciousness: awake, oriented and patient cooperative  Airway & Oxygen Therapy: Patient Spontanous Breathing and Patient connected to nasal cannula oxygen  Post-op Assessment: Report given to RN and Post -op Vital signs reviewed and stable  Post vital signs: Reviewed  Last Vitals:  Vitals Value Taken Time  BP 153/108 12/05/18 1200  Temp 36.4 C 12/05/18 1200  Pulse 116 12/05/18 1201  Resp 15 12/05/18 1201  SpO2 97 % 12/05/18 1201  Vitals shown include unvalidated device data.  Last Pain:  Vitals:   12/05/18 0802  TempSrc: Oral  PainSc: 0-No pain         Complications: No apparent anesthesia complications

## 2018-12-05 NOTE — Anesthesia Postprocedure Evaluation (Signed)
Anesthesia Post Note  Patient: Natasha Barrera  Procedure(s) Performed: LAPAROSCOPIC VENTRAL HERNIA REPAIR WITH MESH (N/A Abdomen)     Patient location during evaluation: PACU Anesthesia Type: General Level of consciousness: awake and alert Pain management: pain level controlled Vital Signs Assessment: post-procedure vital signs reviewed and stable Respiratory status: spontaneous breathing, nonlabored ventilation, respiratory function stable and patient connected to nasal cannula oxygen Cardiovascular status: blood pressure returned to baseline and stable Postop Assessment: no apparent nausea or vomiting Anesthetic complications: no    Last Vitals:  Vitals:   12/05/18 1245 12/05/18 1300  BP: (!) 146/80 139/81  Pulse: 77 75  Resp: 12 16  Temp:    SpO2: 92% 92%    Last Pain:  Vitals:   12/05/18 1257  TempSrc:   PainSc: 5                  Tiajuana Amass

## 2018-12-05 NOTE — Op Note (Signed)
12/05/2018  11:45 AM  PATIENT:  Natasha Barrera  53 y.o. female  PRE-OPERATIVE DIAGNOSIS:  VENTRAL HERNIA  POST-OPERATIVE DIAGNOSIS:  VENTRAL HERNIA  PROCEDURE:  Procedure(s): LAPAROSCOPIC VENTRAL HERNIA REPAIR WITH MESH (N/A)  SURGEON:  Surgeon(s) and Role:    * Jovita Kussmaul, MD - Primary  PHYSICIAN ASSISTANT:   ASSISTANTS: Judyann Munson, RNFA   ANESTHESIA:   local and general  EBL:  minimal   BLOOD ADMINISTERED:none  DRAINS: none   LOCAL MEDICATIONS USED:  MARCAINE     SPECIMEN:  No Specimen  DISPOSITION OF SPECIMEN:  N/A  COUNTS:  YES  TOURNIQUET:  * No tourniquets in log *  DICTATION: .Dragon Dictation   After informed consent was obtained the patient was brought to the operating room and placed in the supine position on the operating table.  After adequate induction of general anesthesia the patient's abdomen was prepped with ChloraPrep, allowed to dry, and draped in usual sterile manner including the use of an Ioban drape.  An appropriate timeout was performed.  A site was chosen in the right upper quadrant for access in the abdominal cavity.  This area was infiltrated with quarter percent Marcaine.  A small stab incision was made with a 15 blade knife.  A 5 mm Optiview port and camera were used to bluntly dissected the layers of the abdominal wall under direct vision and in doing so we were able to access the abdominal cavity without difficulty.  The abdomen was then insufflated with carbon dioxide without difficulty.  The camera was placed through the 5 mm port and the abdomen was inspected.  There were omental adhesions to the anterior abdominal wall.  Another 5 mm port was placed in the right lower quadrant under direct vision.  Laparoscopic scissors were used to take down the omental adhesions.  We were then able to identify the hernia.  With gentle traction using blunt graspers we were able to reduce the omentum out of the hernia sac.  The rest of the adhesions  to the abdominal wall were taken down sharply with the harmonic scalpel.  Once this was accomplished the abdominal wall seem very free and clear and the hernia sac was easily to identify.  We estimated the size of the sac using a spinal needle and ruler.  I plan for at least 3 cm of overlap of the mesh beyond the hernia defect.  There was a second smaller hernia defect just superior to the main 1 and so we chose a little bit larger piece of mesh to make sure this was covered as well.  I chose a 15 cm piece of circular mesh and trimmed the side slightly.  Four #1 Novafil stitches were placed at equidistant points around the edge of the mesh and the mesh was oriented with the coated side down towards the bowel.  Next a small incision was made in the left lower quadrant through the middle of the hernia with a 15 blade knife.  The incision was carried through the skin and subcutaneous tissue sharply with the electrocautery until the hernia sac was entered.  The hernia sac was excised sharply with the electrocautery.  The mesh was then placed into the abdominal cavity in the appropriate orientation and the fascial defect was closed with multiple interrupted #1 Novafil stitches.  The abdomen was then insufflated again and the mesh was again oriented appropriately.  4 small stab incisions were then made at points that corresponded to the anchor  stitches.  A suture passer was then used to bring the tails of each of the stitches through the abdominal wall.  In doing so we did get into some bleeding from the inferior epigastric artery in the left lower quadrant and this was controlled by passing a 0 Vicryl tie around the area of bleeding and cinching it down.  This appeared to stop any bleeding.  Each of the anchor stitches were then cinched down and tied.  The mesh was observed to be in good apposition to the abdominal wall.  The gaps between the stitches were then filled in with a secure strap tacker.  Once this was  accomplished again the mesh was in good position there were no gaps or redundancy in the area was completely hemostatic.  The abdomen was generally inspected no other abnormalities were noted.  At this point the gas was allowed to escape.  The incisions were all closed with interrupted 4-0 Monocryl subcuticular stitches.  There was a deep layer of 0 Vicryl stitch used to close the main incision of the hernia and close the dead space.  Dermabond dressings were then applied.  The patient tolerated the procedure well.  At the end of the case all needle sponge and instrument counts were correct.  The patient was then awakened and taken recovery in stable condition.  PLAN OF CARE: Discharge to home after PACU  PATIENT DISPOSITION:  PACU - hemodynamically stable.   Delay start of Pharmacological VTE agent (>24hrs) due to surgical blood loss or risk of bleeding: not applicable

## 2018-12-05 NOTE — H&P (Signed)
Natasha Barrera  Location: [facility] Patient #: [phone] DOB: Mar 07, 1966 Married / Language: English / Race: Mick[place] Female   History of Present Illness  The patient is a 53 year old female who presents for a follow-up for Abdominal pain. We're asked to see the patient in consultation by Dr. Dorthy Cooler and to evaluate her for a ventral hernia. The patient is a 53 year old Natasha Barrera female who has a history of partial colectomy for diverticulitis about 6 or 7 years ago. She states that she noticed a bulge just to the left and below her umbilicus about 2-3 months after surgery. Since that time the bulge has gradually gotten slightly larger. She denies any nausea or vomiting. She denies any fevers or chills. Her appetite is good and her bowels are working normally. She does have some occasional discomfort associated with the hernia.   Past Surgical History  Resection of Small Bowel   Diagnostic Studies History  Colonoscopy  5-10 years ago Mammogram  within last year Pap Smear  1-5 years ago  Allergies  No Known Allergies     Medication History  Vital-D Rx (1MG Tablet, Oral) Active. amLODIPine Besylate (5MG Tablet, Oral) Active. Medications Reconciled  Social History  Alcohol use  Occasional alcohol use. Caffeine use  Coffee. No drug use  Tobacco use  Former smoker.  Family History  Hypertension  Father.  Pregnancy / Birth History  Age at menarche  1 years. Age of menopause  62-55 Gravida  3 Maternal age  22-25 Para  3  Other Problems  Other disease, cancer, significant illness     Review of Systems General Not Present- Appetite Loss, Chills, Fatigue, Fever, Night Sweats, Weight Gain and Weight Loss. Skin Not Present- Change in Wart/Mole, Dryness, Hives, Jaundice, New Lesions, Non-Healing Wounds, Rash and Ulcer. HEENT Present- Seasonal Allergies. Not Present- Earache, Hearing Loss, Hoarseness, Nose Bleed, Oral Ulcers, Ringing in the  Ears, Sinus Pain, Sore Throat, Visual Disturbances, Wears glasses/contact lenses and Yellow Eyes. Respiratory Not Present- Bloody sputum, Chronic Cough, Difficulty Breathing, Snoring and Wheezing. Cardiovascular Not Present- Chest Pain, Difficulty Breathing Lying Down, Leg Cramps, Palpitations, Rapid Heart Rate, Shortness of Breath and Swelling of Extremities. Gastrointestinal Not Present- Abdominal Pain, Bloating, Bloody Stool, Change in Bowel Habits, Chronic diarrhea, Constipation, Difficulty Swallowing, Excessive gas, Gets full quickly at meals, Hemorrhoids, Indigestion, Nausea, Rectal Pain and Vomiting. Female Genitourinary Not Present- Frequency, Nocturia, Painful Urination, Pelvic Pain and Urgency. Musculoskeletal Not Present- Back Pain, Joint Pain, Joint Stiffness, Muscle Pain, Muscle Weakness and Swelling of Extremities. Neurological Not Present- Decreased Memory, Fainting, Headaches, Numbness, Seizures, Tingling, Tremor, Trouble walking and Weakness. Psychiatric Not Present- Anxiety, Bipolar, Change in Sleep Pattern, Depression, Fearful and Frequent crying. Endocrine Not Present- Cold Intolerance, Excessive Hunger, Hair Changes, Heat Intolerance, Hot flashes and New Diabetes. Hematology Not Present- Blood Thinners, Easy Bruising, Excessive bleeding, Gland problems, HIV and Persistent Infections.  Vitals  Weight: 131.2 lb Height: 64in Body Surface Area: 1.64 m Body Mass Index: 22.52 kg/m  Temp.: 77F (Temporal)  Pulse: 138 (Regular)  P.OX: 71% (Room air) BP: 158/90(Sitting, Left Arm, Standard)       Physical Exam  General Mental Status-Alert. General Appearance-Consistent with stated age. Hydration-Well hydrated. Voice-Normal.  Head and Neck Head-normocephalic, atraumatic with no lesions or palpable masses. Trachea-midline. Thyroid Gland Characteristics - normal size and consistency.  Eye Eyeball - Bilateral-Extraocular movements  intact. Sclera/Conjunctiva - Bilateral-No scleral icterus.  Chest and Lung Exam Chest and lung exam reveals -quiet, even and easy respiratory  effort with no use of accessory muscles and on auscultation, normal breath sounds, no adventitious sounds and normal vocal resonance. Inspection Chest Wall - Normal. Back - normal.  Cardiovascular Cardiovascular examination reveals -normal heart sounds, regular rate and rhythm with no murmurs and normal pedal pulses bilaterally.  Abdomen Note: The abdomen is soft with minimal tenderness. There is a well-healed lower midline scar. There is a moderate size bulge just to the left of the scar. The contents of the bulge seemed to partially reduce but not completely. She has no signs of obstruction.   Neurologic Neurologic evaluation reveals -alert and oriented x 3 with no impairment of recent or remote memory. Mental Status-Normal.  Musculoskeletal Normal Exam - Left-Upper Extremity Strength Normal and Lower Extremity Strength Normal. Normal Exam - Right-Upper Extremity Strength Normal and Lower Extremity Strength Normal.  Lymphatic Head & Neck  General Head & Neck Lymphatics: Bilateral - Description - Normal. Axillary  General Axillary Region: Bilateral - Description - Normal. Tenderness - Non Tender. Femoral & Inguinal  Generalized Femoral & Inguinal Lymphatics: Bilateral - Description - Normal. Tenderness - Non Tender.    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 There appears to be some element of incarceration but this may be omentum since she has no signs of obstruction. Because of the risk of incarceration and strangulation and he think she would benefit from having this fixed. She would also like to have this done. I have discussed with her in detail the risks and benefits of  the operation as well as similar technical aspects and she understands and wishes to proceed. I think she will be a good candidate for a laparoscopic type repair.  Current Plans Pt Education - Abdominal Wall Hernias: discussed with patient and provided information.

## 2018-12-05 NOTE — Anesthesia Procedure Notes (Signed)
Procedure Name: Intubation Date/Time: 12/05/2018 10:15 AM Performed by: Jenne Campus, CRNA Pre-anesthesia Checklist: Patient identified, Emergency Drugs available, Suction available and Patient being monitored Patient Re-evaluated:Patient Re-evaluated prior to induction Oxygen Delivery Method: Circle System Utilized Preoxygenation: Pre-oxygenation with 100% oxygen Induction Type: IV induction Ventilation: Mask ventilation without difficulty Laryngoscope Size: Miller and 2 Grade View: Grade I Tube type: Oral Tube size: 7.0 mm Number of attempts: 1 Airway Equipment and Method: Stylet and Oral airway Placement Confirmation: ETT inserted through vocal cords under direct vision,  positive ETCO2 and breath sounds checked- equal and bilateral Secured at: 20 cm Tube secured with: Tape Dental Injury: Teeth and Oropharynx as per pre-operative assessment

## 2018-12-05 NOTE — Interval H&P Note (Signed)
History and Physical Interval Note:  12/05/2018 9:53 AM  Natasha Barrera  has presented today for surgery, with the diagnosis of VENTRAL HERNIA.  The various methods of treatment have been discussed with the patient and family. After consideration of risks, benefits and other options for treatment, the patient has consented to  Procedure(s): [place] (N/A) as a surgical intervention.  The patient's history has been reviewed, patient examined, no change in status, stable for surgery.  I have reviewed the patient's chart and labs.  Questions were answered to the patient's satisfaction.     Autumn Messing III

## 2018-12-09 ENCOUNTER — Encounter (HOSPITAL_COMMUNITY): Payer: Self-pay | Admitting: General Surgery

## 2018-12-16 MED ORDER — PHENYLEPHRINE HCL-NACL 10-0.9 MG/250ML-% IV SOLN
INTRAVENOUS | Status: AC
  Filled 2018-12-16: qty 500

## 2019-01-09 ENCOUNTER — Other Ambulatory Visit: Payer: Self-pay | Admitting: Family Medicine

## 2019-01-09 DIAGNOSIS — Z1231 Encounter for screening mammogram for malignant neoplasm of breast: Secondary | ICD-10-CM

## 2019-02-24 ENCOUNTER — Ambulatory Visit
Admission: RE | Admit: 2019-02-24 | Discharge: 2019-02-24 | Disposition: A | Discharge status: Home / Self Care | Payer: 59 | Source: Ambulatory Visit | Attending: Family Medicine | Admitting: Family Medicine

## 2019-02-24 ENCOUNTER — Other Ambulatory Visit: Payer: Self-pay

## 2019-02-24 DIAGNOSIS — Z1231 Encounter for screening mammogram for malignant neoplasm of breast: Secondary | ICD-10-CM

## 2019-03-02 ENCOUNTER — Other Ambulatory Visit: Payer: Self-pay | Admitting: Family Medicine

## 2019-03-02 DIAGNOSIS — R928 Other abnormal and inconclusive findings on diagnostic imaging of breast: Secondary | ICD-10-CM

## 2019-03-05 ENCOUNTER — Ambulatory Visit
Admission: RE | Admit: 2019-03-05 | Discharge: 2019-03-05 | Disposition: A | Discharge status: Home / Self Care | Payer: 59 | Source: Ambulatory Visit | Attending: Family Medicine | Admitting: Family Medicine

## 2019-03-05 ENCOUNTER — Other Ambulatory Visit: Payer: Self-pay

## 2019-03-05 DIAGNOSIS — R928 Other abnormal and inconclusive findings on diagnostic imaging of breast: Secondary | ICD-10-CM

## 2019-09-19 ENCOUNTER — Observation Stay (HOSPITAL_COMMUNITY)
Admission: EM | Admit: 2019-09-19 | Discharge: 2019-09-20 | Disposition: A | Discharge status: Home / Self Care | Payer: 59 | Attending: Internal Medicine | Admitting: Internal Medicine

## 2019-09-19 ENCOUNTER — Encounter (HOSPITAL_COMMUNITY): Payer: Self-pay | Admitting: *Deleted

## 2019-09-19 ENCOUNTER — Other Ambulatory Visit: Payer: Self-pay

## 2019-09-19 DIAGNOSIS — R109 Unspecified abdominal pain: Secondary | ICD-10-CM | POA: Diagnosis not present

## 2019-09-19 DIAGNOSIS — Z79899 Other long term (current) drug therapy: Secondary | ICD-10-CM | POA: Diagnosis not present

## 2019-09-19 DIAGNOSIS — R9431 Abnormal electrocardiogram [ECG] [EKG]: Secondary | ICD-10-CM | POA: Diagnosis not present

## 2019-09-19 DIAGNOSIS — E876 Hypokalemia: Principal | ICD-10-CM | POA: Insufficient documentation

## 2019-09-19 DIAGNOSIS — R197 Diarrhea, unspecified: Secondary | ICD-10-CM | POA: Insufficient documentation

## 2019-09-19 DIAGNOSIS — E860 Dehydration: Secondary | ICD-10-CM | POA: Insufficient documentation

## 2019-09-19 DIAGNOSIS — Z87891 Personal history of nicotine dependence: Secondary | ICD-10-CM | POA: Insufficient documentation

## 2019-09-19 DIAGNOSIS — E871 Hypo-osmolality and hyponatremia: Secondary | ICD-10-CM | POA: Diagnosis not present

## 2019-09-19 DIAGNOSIS — I10 Essential (primary) hypertension: Secondary | ICD-10-CM | POA: Insufficient documentation

## 2019-09-19 DIAGNOSIS — Z20822 Contact with and (suspected) exposure to covid-19: Secondary | ICD-10-CM | POA: Insufficient documentation

## 2019-09-19 LAB — COMPREHENSIVE METABOLIC PANEL
ALT: 26 U/L (ref 0–44)
AST: 24 U/L (ref 15–41)
Albumin: 2.3 g/dL — ABNORMAL LOW (ref 3.5–5.0)
Alkaline Phosphatase: 70 U/L (ref 38–126)
Anion gap: 15 (ref 5–15)
BUN: <5 mg/dL — ABNORMAL LOW (ref 6–20)
CO2: 29 mmol/L (ref 22–32)
Calcium: 8.4 mg/dL — ABNORMAL LOW (ref 8.9–10.3)
Chloride: 85 mmol/L — ABNORMAL LOW (ref 98–111)
Creatinine, Ser: 0.62 mg/dL (ref 0.44–1.00)
GFR calc Af Amer: >60 mL/min (ref >60)
GFR calc non Af Amer: >60 mL/min (ref >60)
Glucose, Bld: 113 mg/dL — ABNORMAL HIGH (ref 70–99)
Potassium: 2.1 mmol/L — CL (ref 3.5–5.1)
Sodium: 129 mmol/L — ABNORMAL LOW (ref 135–145)
Total Bilirubin: 0.8 mg/dL (ref 0.3–1.2)
Total Protein: 6.7 g/dL (ref 6.5–8.1)

## 2019-09-19 LAB — URINALYSIS, ROUTINE W REFLEX MICROSCOPIC
Bacteria, UA: NONE SEEN
Bilirubin Urine: NEGATIVE
Glucose, UA: NEGATIVE mg/dL
Ketones, ur: 5 mg/dL — AB
Leukocytes,Ua: NEGATIVE
Nitrite: POSITIVE — AB
Protein, ur: NEGATIVE mg/dL
Specific Gravity, Urine: 1.002 — ABNORMAL LOW (ref 1.005–1.030)
pH: 7 (ref 5.0–8.0)

## 2019-09-19 LAB — CBC
HCT: 41.7 % (ref 36.0–46.0)
Hemoglobin: 14.5 g/dL (ref 12.0–15.0)
MCH: 34.9 pg — ABNORMAL HIGH (ref 26.0–34.0)
MCHC: 34.8 g/dL (ref 30.0–36.0)
MCV: 100.2 fL — ABNORMAL HIGH (ref 80.0–100.0)
Platelets: 597 10*3/uL — ABNORMAL HIGH (ref 150–400)
RBC: 4.16 MIL/uL (ref 3.87–5.11)
RDW: 12.2 % (ref 11.5–15.5)
WBC: 9.5 10*3/uL (ref 4.0–10.5)
nRBC: 0 % (ref 0.0–0.2)

## 2019-09-19 LAB — I-STAT BETA HCG BLOOD, ED (MC, WL, AP ONLY): I-stat hCG, quantitative: 17.8 m[IU]/mL — ABNORMAL HIGH (ref <5)

## 2019-09-19 LAB — MAGNESIUM: Magnesium: 1.7 mg/dL (ref 1.7–2.4)

## 2019-09-19 LAB — SARS CORONAVIRUS 2 BY RT PCR (HOSPITAL ORDER, PERFORMED IN ~~LOC~~ HOSPITAL LAB): SARS Coronavirus 2: NEGATIVE

## 2019-09-19 LAB — LIPASE, BLOOD: Lipase: 18 U/L (ref 11–51)

## 2019-09-19 MED ORDER — POTASSIUM CHLORIDE 10 MEQ/100ML IV SOLN
10.0000 meq | INTRAVENOUS | Status: AC
  Administered 2019-09-19 (×3): 10 meq via INTRAVENOUS
  Filled 2019-09-19 (×3): qty 100

## 2019-09-19 MED ORDER — ACETAMINOPHEN 650 MG RE SUPP: 650.0000 mg | Freq: Four times a day (QID) | RECTAL | Status: DC | PRN

## 2019-09-19 MED ORDER — POTASSIUM CHLORIDE CRYS ER 20 MEQ PO TBCR
40.0000 meq | EXTENDED_RELEASE_TABLET | Freq: Once | ORAL | Status: AC
  Administered 2019-09-19: 40 meq via ORAL
  Filled 2019-09-19: qty 2

## 2019-09-19 MED ORDER — ENOXAPARIN SODIUM 40 MG/0.4ML ~~LOC~~ SOLN
40.0000 mg | SUBCUTANEOUS | Status: DC
  Filled 2019-09-19: qty 0.4

## 2019-09-19 MED ORDER — LACTATED RINGERS IV BOLUS
1000.0000 mL | Freq: Once | INTRAVENOUS | Status: AC
  Administered 2019-09-19: 1000 mL via INTRAVENOUS

## 2019-09-19 MED ORDER — ACETAMINOPHEN 325 MG PO TABS: 650.0000 mg | ORAL_TABLET | Freq: Four times a day (QID) | ORAL | Status: DC | PRN

## 2019-09-19 MED ORDER — SODIUM CHLORIDE 0.9% FLUSH
3.0000 mL | Freq: Once | INTRAVENOUS | Status: AC
  Administered 2019-09-19: 3 mL via INTRAVENOUS

## 2019-09-19 NOTE — Hospital Course (Addendum)
Admitted 09/19/2019  Allergies: Patient has no known allergies. Pertinent Hx: Diverticulitis  54 y.o. female p/w abdominal discomfort  * Hypokalemia: K 2.1, EKG showed prolonged PR interval ***  Patient reports that about 1 week ago she started having intermittent episodes of abdominal discomfort, reports decreased appetites. Located in the middle of her stomach, lasted for about 1.5 minutes at a time, mild discomfort, felt like bad gas.  Reports history of diverticulitis in 2013, denied any issues since then.  Denies nausea, vomiting. Reports having bowel movements more often, aboiut 2-3 in the middle of the night. Started taking lactate due to concern about lactose intolerance. Bowel movements have improved. She reporst feeling hungry.  Denies fevers, chills, melena, hematochezia, ***. Felt like she had a slight fever, grandson coughed in her face.  Is fully vaccinated. Everyone at home is vaccinated. She had been checked twice at home.  Was on BP medications in feb, stopped taking it because she didn't get her meds renewed. Is looking for a new physician.   Consults: None  Meds: K-dur, *** VTE ppx: Lovenox IVF: LR Diet: Reg

## 2019-09-19 NOTE — ED Provider Notes (Signed)
[facility] EMERGENCY DEPARTMENT Provider Note   CSN: [id number] Arrival date & time: 09/19/19  1603     History Chief Complaint  Patient presents with   Abdominal Pain    Natasha Barrera is a 54 y.o. female.  The history is provided by the patient and medical records. No language interpreter was used.  Abdominal Pain  Natasha Barrera is a 54 y.o. female who presents to the Emergency Department complaining of abdominal discomfort. She presents the emergency department complaining of abdominal discomfort and poor appetite for the last two weeks. She states that for two weeks most food does not taste right to her. She has nausea and occasional food aversion. No fevers or vomiting. Sometimes when she eats she does develop abdominal discomfort across her epigastrum that then resolved. She denies any fevers, shortness of breath, cough, chest pain. No known sick contacts. She does have some associated diarrhea, about two bowel movements daily. She developed dysuria today. Symptoms are moderate, constant, worsening.    Past Medical History:  Diagnosis Date   Diverticul disease small and large intestine, no perforati or abscess    Diverticulitis of colon    Hypertension     Patient Active Problem List   Diagnosis Date Noted   Hypokalemia 09/19/2019   Diverticulitis of colon 12/29/2011    Past Surgical History:  Procedure Laterality Date   COLON SURGERY  12/25/2011   COLONOSCOPY     HEMORRHOID SURGERY  2002   VENTRAL HERNIA REPAIR N/A 12/05/2018   Procedure: LAPAROSCOPIC VENTRAL HERNIA REPAIR WITH MESH;  Surgeon: Jovita Kussmaul, MD;  Location: [facility];  Service: General;  Laterality: N/A;     OB History   No obstetric history on file.     Family History  Problem Relation Age of Onset   Breast cancer Neg Hx     Social History   Tobacco Use   Smoking status: Former Smoker   Smokeless tobacco: Never Used  Scientific laboratory technician Use: Never used    Substance Use Topics   Alcohol use: Yes    Comment: occasional   Drug use: No    Home Medications Prior to Admission medications   Medication Sig Start Date End Date Taking? Authorizing Provider  Calcium Carbonate (CALCIUM 500 PO) Take 1 capsule by mouth daily.   Yes [provider]  Cholecalciferol (VITAMIN D) 50 MCG (2000 UT) CAPS Take 2,000 Units by mouth daily.   Yes [provider]  Multiple Vitamin (MULTIVITAMIN ADULT PO) Take 1 tablet by mouth daily.   Yes [provider]  HYDROcodone-acetaminophen (NORCO/VICODIN) 5-325 MG tablet Take 1-2 tablets by mouth every 6 (six) hours as needed for moderate pain or severe pain. Patient not taking: Reported on 09/19/2019 12/05/18   Autumn Messing III, MD  methocarbamol (ROBAXIN) 750 MG tablet Take 1 tablet (750 mg total) by mouth 4 (four) times daily as needed (use for muscle cramps/pain). Patient not taking: Reported on 09/19/2019 12/05/18   Jovita Kussmaul, MD    Allergies    Patient has no known allergies.  Review of Systems   Review of Systems  Gastrointestinal: Positive for abdominal pain.  All other systems reviewed and are negative.   Physical Exam Updated Vital Signs BP 115/68    Pulse 93    Temp 98.3 F (36.8 C) (Oral)    Resp 17    Ht 5' 4"  (1.626 m)    Wt 59.6 kg  LMP 12/15/2014    SpO2 96%    BMI 22.55 kg/m   Physical Exam Vitals and nursing note reviewed.  Constitutional:      Appearance: She is well-developed.  HENT:     Head: Normocephalic and atraumatic.  Cardiovascular:     Rate and Rhythm: Normal rate and regular rhythm.     Heart sounds: No murmur heard.   Pulmonary:     Effort: Pulmonary effort is normal. No respiratory distress.     Breath sounds: Normal breath sounds.  Abdominal:     Palpations: Abdomen is soft.     Tenderness: There is no guarding or rebound.     Comments: Minimal generalized abdominal tenderness  Musculoskeletal:        General: No tenderness.  Skin:     General: Skin is warm and dry.  Neurological:     Mental Status: She is alert and oriented to person, place, and time.  Psychiatric:        Behavior: Behavior normal.     ED Results / Procedures / Treatments   Labs (all labs ordered are listed, but only abnormal results are displayed) Labs Reviewed  COMPREHENSIVE METABOLIC PANEL - Abnormal; Notable for the following components:      Result Value   Sodium 129 (*)    Potassium 2.1 (*)    Chloride 85 (*)    Glucose, Bld 113 (*)    BUN <5 (*)    Calcium 8.4 (*)    Albumin 2.3 (*)    All other components within normal limits  CBC - Abnormal; Notable for the following components:   MCV 100.2 (*)    MCH 34.9 (*)    Platelets 597 (*)    All other components within normal limits  URINALYSIS, ROUTINE W REFLEX MICROSCOPIC - Abnormal; Notable for the following components:   APPearance HAZY (*)    Specific Gravity, Urine 1.002 (*)    Hgb urine dipstick SMALL (*)    Ketones, ur 5 (*)    Nitrite POSITIVE (*)    All other components within normal limits  I-STAT BETA HCG BLOOD, ED ([facility], [facility], [facility] ONLY) - Abnormal; Notable for the following components:   I-stat hCG, quantitative 17.8 (*)    All other components within normal limits  SARS CORONAVIRUS 2 BY RT PCR (HOSPITAL ORDER, [facility])  LIPASE, BLOOD  MAGNESIUM  COMPREHENSIVE METABOLIC PANEL  CBC  HIV ANTIBODY (ROUTINE TESTING W REFLEX)  HCG, QUANTITATIVE, PREGNANCY    EKG EKG Interpretation  Date/Time:  Saturday September 19 2019 18:59:15 EDT Ventricular Rate:  96 PR Interval:    QRS Duration: 95 QT Interval:  405 QTC Calculation: 512 R Axis:   78 Text Interpretation: Sinus rhythm Prolonged QT interval Confirmed by Quintella Reichert [phone]) on 09/19/2019 8:02:43 PM   Radiology No results found.  Procedures Procedures (including critical care time) CRITICAL CARE Performed by: Quintella Reichert   Total critical care time: 35 minutes  Critical care  time was exclusive of separately billable procedures and treating other patients.  Critical care was necessary to treat or prevent imminent or life-threatening deterioration.  Critical care was time spent personally by me on the following activities: development of treatment plan with patient and/or surrogate as well as nursing, discussions with consultants, evaluation of patient's response to treatment, examination of patient, obtaining history from patient or surrogate, ordering and performing treatments and interventions, ordering and review of laboratory studies, ordering and review of radiographic  studies, pulse oximetry and re-evaluation of patient's condition.  Medications Ordered in ED Medications  potassium chloride 10 mEq in 100 mL IVPB (10 mEq Intravenous New Bag/Given 09/19/19 1954)  enoxaparin (LOVENOX) injection 40 mg (has no administration in time range)  acetaminophen (TYLENOL) tablet 650 mg (has no administration in time range)    Or  acetaminophen (TYLENOL) suppository 650 mg (has no administration in time range)  sodium chloride flush (NS) 0.9 % injection 3 mL (3 mLs Intravenous Given 09/19/19 1825)  lactated ringers bolus 1,000 mL (0 mLs Intravenous Stopped 09/19/19 1952)  potassium chloride SA (KLOR-CON) CR tablet 40 mEq (40 mEq Oral Given 09/19/19 1812)    ED Course  I have reviewed the triage vital signs and the nursing notes.  Pertinent labs & imaging results that were available during my care of the patient were reviewed by me and considered in my medical decision making (see chart for details).    MDM Rules/Calculators/A&P                         patient here for evaluation of nausea, diarrhea and food aversion for the last two weeks. She has no abdominal tenderness on examination. She is dehydrated. Labs significant for hypokalemia, hyponatremia. EKG does demonstrate QT prolongation. She was treated with IV fluid hydration as well as potassium supplementation. Given  degree of hypokalemia recommend admission for observation. Patient updated findings of studies recommendation for mission and she is in agreement with treatment plan. Medicine consulted for admission.  Final Clinical Impression(s) / ED Diagnoses Final diagnoses:  Hypokalemia  Dehydration    Rx / DC Orders ED Discharge Orders    None       Quintella Reichert, MD 09/19/19 2047

## 2019-09-19 NOTE — ED Notes (Addendum)
Dr. Ralene Bathe and Charge RN notified of critical Potassium 2.1.  Pt took back to triage for EKG.

## 2019-09-19 NOTE — H&P (Addendum)
Date: 09/19/2019               Patient Name:  Natasha Barrera MRN: [id number]  DOB: 17-May-1965 Age / Sex: 54 y.o., female   PCP: Natasha Amel, MD         Medical Service: Internal Medicine Teaching Service         Attending Physician: Dr. Quintella Reichert, MD    First Contact: Natasha Rile, MD, Natasha Barrera Pager: [phone]  Second Contact: Natasha Barrera Pager: Natasha Barrera [phone])       After Hours (After 5p/  First Contact Pager: [phone]  weekends / holidays): Second Contact Pager: [phone]   Chief Complaint: Abdominal pain   History of Present Illness: Natasha Barrera is a 54 year old female with a past medical history of hypertension, diverticulitis, and ventral hernia repair who presented to the ED with abdominal discomfort and diarrhea.  The patient's symptoms started roughly 1 week ago.  She started noticing vague abdominal discomfort in the epigastric region that lasted for 1 to 2 minutes after eating.  She did not notice any particular foods that triggered the discomfort.  However, over the course of the week the patient's appetite had decreased and started to have diarrhea. She describes it as brown and watery, and denies any black or bloody stools.  When the diarrhea started, she was in the restroom every hour.  She started to drink Lactaid due to concern for lactose intolerance.   Of note, the patient reports being fully vaccinated against COVID-19 as of March 2021.  The patient denies having any sick contacts at home, but states that her grandson accidentally coughed in her face a week ago.  Patient states she has not taking any medication since February 2021 but plans to reestablish her primary care doctor at [facility].  She used to take antihypertensives.  No recent medication changes. The patient denies having any fevers, chills melena, hematochezia, nausea or vomiting, or dysuria.  In the ED, a CBC, CMP, lipase, magnesium, and i-STAT beta-hCG were obtained.  Patient is  hyponatremic to 129 and hypokalemic to 2.1.  Beta-hCG was elevated to 17.8.  Patient was given potassium 40 mEq tablet and started on IV potassium.  She also received LR bolus.  Meds:  Current Meds  Medication Sig  . Calcium Carbonate (CALCIUM 500 PO) Take 1 capsule by mouth daily.  . Cholecalciferol (VITAMIN D) 50 MCG (2000 UT) CAPS Take 2,000 Units by mouth daily.  . Multiple Vitamin (MULTIVITAMIN ADULT PO) Take 1 tablet by mouth daily.   Allergies: Allergies as of 09/19/2019  . (No Known Allergies)   Past Medical History:  Diagnosis Date  . Diverticul disease small and large intestine, no perforati or abscess   . Diverticulitis of colon   . Hypertension    Past Surgical History:  Procedure Laterality Date  . COLON SURGERY  12/25/2011  . COLONOSCOPY    . [place]  2002  . VENTRAL HERNIA REPAIR N/A 12/05/2018   Procedure: LAPAROSCOPIC VENTRAL HERNIA REPAIR WITH MESH;  Surgeon: Natasha Kussmaul, MD;  Location: [facility] OR;  Service: General;  Laterality: N/A;   Family History:  Family History  Problem Relation Age of Onset  . Breast cancer Neg Hx     Social History:  Social History   Tobacco Use  . Smoking status: Former Research scientist (life sciences)  . Smokeless tobacco: Never Used  Vaping Use  . Vaping Use: Never used  Substance Use Topics  . Alcohol use: Yes  Comment: occasional  . Drug use: No    Review of Systems: A complete ROS was negative except as per HPI.   Imaging: EKG: personally reviewed my interpretation is no signs of overt ischemia, prolonged QT interval.  Physical Exam: Blood pressure 115/68, pulse 93, temperature 98.3 F (36.8 C), temperature source Oral, resp. rate 17, height 5' 4"  (1.626 m), weight 59.6 kg, last menstrual period 12/15/2014, SpO2 96 %.  Physical Exam Vitals reviewed.  Constitutional:      General: She is not in acute distress.    Appearance: She is well-developed and normal weight. She is not ill-appearing, toxic-appearing or diaphoretic.    HENT:     Head: Normocephalic and atraumatic.  Cardiovascular:     Rate and Rhythm: Normal rate and regular rhythm.     Heart sounds: Normal heart sounds. No murmur heard.  No friction rub. No gallop.   Pulmonary:     Effort: Pulmonary effort is normal. No respiratory distress.     Breath sounds: Normal breath sounds. No wheezing or rales.  Abdominal:     General: Abdomen is flat. A surgical scar is present. Bowel sounds are normal. There is no distension or abdominal bruit. There are no signs of injury.     Palpations: Abdomen is soft.     Tenderness: There is no abdominal tenderness.  Genitourinary:    Uterus: Normal.   Skin:    General: Skin is warm.  Neurological:     General: No focal deficit present.     Mental Status: She is alert and oriented to person, place, and time.  Psychiatric:        Behavior: Behavior normal.    Assessment & Plan by Problem: Active Problems:   Hypokalemia  In summary, [name] is a 54 year old female with a past medical history significant for hypertension, diverticulitis, and ventral hernia repair who presented to the ED after a week of abdominal discomfort and diarrhea.  The patient is also hypokalemic to 2.1 on admission.  Spite having no sick contacts, the patient's abdominal discomfort and diarrhea is suspicious of some viral gastroenteritis likely causing electrolyte abnormalities.  #Hypokalemia: 2.1 on admission -Continue IV potassium -BMP at 10 PM  -Telemetry  #Diarrhea: Patient states her diarrhea has improved over the last day or so.  She has not had a bowel movement today. -Continue to monitor  #Prolonged QT -Avoid QT prolonging medications  #Elevated beta hCG: Slightly elevated to 17.8.  This can be mildly elevated in menopause patient's.  Low suspicion for pregnancy. -hCG tomorrow morning  #FEN/GI -Diet: Regular -Fluids: LR  #DVT prophylaxis -Lovenox 15m subq injections daily  #CODE STATUS: FULL  #Dispo: Admit  patient to Observation with expected length of stay less than 2 midnights. Prior to Admission Living Arrangement: Home Anticipated Discharge Location: Home Barriers to Discharge: Ongoing medical workup  Signed: AEarlene Plater MD Internal Medicine, PGY1 Pager: [phone] 09/19/2019,8:02 PM

## 2019-09-19 NOTE — ED Triage Notes (Signed)
The pt is c/o abd pain for 2 weeks with n v and diarrhea  Today she has had painful urination.  She has had both vaccines of the mederna vaccine

## 2019-09-20 DIAGNOSIS — R197 Diarrhea, unspecified: Secondary | ICD-10-CM | POA: Diagnosis not present

## 2019-09-20 DIAGNOSIS — E876 Hypokalemia: Secondary | ICD-10-CM | POA: Diagnosis not present

## 2019-09-20 LAB — CBC
HCT: 37.4 % (ref 36.0–46.0)
Hemoglobin: 13 g/dL (ref 12.0–15.0)
MCH: 34.3 pg — ABNORMAL HIGH (ref 26.0–34.0)
MCHC: 34.8 g/dL (ref 30.0–36.0)
MCV: 98.7 fL (ref 80.0–100.0)
Platelets: 498 10*3/uL — ABNORMAL HIGH (ref 150–400)
RBC: 3.79 MIL/uL — ABNORMAL LOW (ref 3.87–5.11)
RDW: 12 % (ref 11.5–15.5)
WBC: 5.7 10*3/uL (ref 4.0–10.5)
nRBC: 0 % (ref 0.0–0.2)

## 2019-09-20 LAB — BASIC METABOLIC PANEL
Anion gap: 10 (ref 5–15)
BUN: <5 mg/dL — ABNORMAL LOW (ref 6–20)
CO2: 27 mmol/L (ref 22–32)
Calcium: 7.9 mg/dL — ABNORMAL LOW (ref 8.9–10.3)
Chloride: 94 mmol/L — ABNORMAL LOW (ref 98–111)
Creatinine, Ser: 0.51 mg/dL (ref 0.44–1.00)
GFR calc Af Amer: >60 mL/min (ref >60)
GFR calc non Af Amer: >60 mL/min (ref >60)
Glucose, Bld: 118 mg/dL — ABNORMAL HIGH (ref 70–99)
Potassium: 3.6 mmol/L (ref 3.5–5.1)
Sodium: 131 mmol/L — ABNORMAL LOW (ref 135–145)

## 2019-09-20 LAB — COMPREHENSIVE METABOLIC PANEL
ALT: 23 U/L (ref 0–44)
AST: 21 U/L (ref 15–41)
Albumin: 2 g/dL — ABNORMAL LOW (ref 3.5–5.0)
Alkaline Phosphatase: 56 U/L (ref 38–126)
Anion gap: 11 (ref 5–15)
BUN: <5 mg/dL — ABNORMAL LOW (ref 6–20)
CO2: 27 mmol/L (ref 22–32)
Calcium: 8 mg/dL — ABNORMAL LOW (ref 8.9–10.3)
Chloride: 94 mmol/L — ABNORMAL LOW (ref 98–111)
Creatinine, Ser: 0.47 mg/dL (ref 0.44–1.00)
GFR calc Af Amer: >60 mL/min (ref >60)
GFR calc non Af Amer: >60 mL/min (ref >60)
Glucose, Bld: 110 mg/dL — ABNORMAL HIGH (ref 70–99)
Potassium: 2.6 mmol/L — CL (ref 3.5–5.1)
Sodium: 132 mmol/L — ABNORMAL LOW (ref 135–145)
Total Bilirubin: 0.7 mg/dL (ref 0.3–1.2)
Total Protein: 5.4 g/dL — ABNORMAL LOW (ref 6.5–8.1)

## 2019-09-20 LAB — HCG, QUANTITATIVE, PREGNANCY: hCG, Beta Chain, Quant, S: 13 m[IU]/mL — ABNORMAL HIGH (ref <5)

## 2019-09-20 LAB — HIV ANTIBODY (ROUTINE TESTING W REFLEX): HIV Screen 4th Generation wRfx: NONREACTIVE

## 2019-09-20 MED ORDER — POTASSIUM CHLORIDE CRYS ER 20 MEQ PO TBCR
40.0000 meq | EXTENDED_RELEASE_TABLET | Freq: Two times a day (BID) | ORAL | Status: DC
  Administered 2019-09-20: 40 meq via ORAL
  Filled 2019-09-20: qty 2

## 2019-09-20 MED ORDER — POTASSIUM CHLORIDE ER 10 MEQ PO TBCR: 20.0000 meq | EXTENDED_RELEASE_TABLET | Freq: Every day | ORAL | 0 refills | Status: DC

## 2019-09-20 MED ORDER — LOPERAMIDE HCL 2 MG PO CAPS
2.0000 mg | ORAL_CAPSULE | Freq: Once | ORAL | Status: AC
  Administered 2019-09-20: 2 mg via ORAL
  Filled 2019-09-20: qty 1

## 2019-09-20 MED ORDER — LOPERAMIDE HCL 2 MG PO TABS: 2.0000 mg | ORAL_TABLET | Freq: Four times a day (QID) | ORAL | 0 refills | Status: AC | PRN

## 2019-09-20 MED ORDER — POTASSIUM CHLORIDE 10 MEQ/100ML IV SOLN
10.0000 meq | INTRAVENOUS | Status: DC
  Filled 2019-09-20: qty 100

## 2019-09-20 MED ORDER — POTASSIUM CHLORIDE IN NACL 40-0.9 MEQ/L-% IV SOLN
INTRAVENOUS | Status: DC
  Filled 2019-09-20: qty 1000

## 2019-09-20 MED ORDER — MAGNESIUM SULFATE 2 GM/50ML IV SOLN
2.0000 g | Freq: Once | INTRAVENOUS | Status: AC
  Administered 2019-09-20: 2 g via INTRAVENOUS
  Filled 2019-09-20: qty 50

## 2019-09-20 MED ORDER — POTASSIUM CHLORIDE CRYS ER 20 MEQ PO TBCR
40.0000 meq | EXTENDED_RELEASE_TABLET | ORAL | Status: DC
  Administered 2019-09-20 (×2): 40 meq via ORAL
  Filled 2019-09-20 (×2): qty 2

## 2019-09-20 NOTE — Discharge Summary (Signed)
   Name: Natasha Barrera MRN: [id number] DOB: 29-Jul-1965 54 y.o. PCP: Lujean Amel, MD  Date of Admission: 09/19/2019  4:15 PM Date of Discharge: 09/20/2019 Attending Physician: Lucious Groves, DO  Discharge Diagnosis: 1. Hypokalemia 2/2 Diarrhea 2. Prolonged qTC  Discharge Medications: Allergies as of 09/20/2019   No Known Allergies     Medication List    STOP taking these medications   HYDROcodone-acetaminophen 5-325 MG tablet Commonly known as: NORCO/VICODIN   methocarbamol 750 MG tablet Commonly known as: ROBAXIN     TAKE these medications   CALCIUM 500 PO Take 1 capsule by mouth daily.   loperamide 2 MG tablet Commonly known as: Imodium A-D Take 1 tablet (2 mg total) by mouth 4 (four) times daily as needed for diarrhea or loose stools.   MULTIVITAMIN ADULT PO Take 1 tablet by mouth daily.   potassium chloride 10 MEQ tablet Commonly known as: KLOR-CON Take 2 tablets (20 mEq total) by mouth daily.   Vitamin D 50 MCG (2000 UT) Caps Take 2,000 Units by mouth daily.       Disposition and follow-up:   Ms.Natasha Barrera was discharged from [facility] in Stable condition.  At the hospital follow up visit please address:  1. Hypokalemia 2/2 Diarrhea: likley viral GI illness, improved with supportive care. Potassium repleted.  2. Prolonged qTC: 2/2 hypokalmia. Confirm resolution of qTC prior to starting qTC prolonging medications.   2.  Labs / imaging needed at time of follow-up: none  3.  Pending labs/ test needing follow-up: Potassium, ECG  Follow-up Appointments:  Follow-up Information    [facility]. Call in 1 week(s).   Why: Call in one week's time to schedule an appointment.  Contact information: [address] [place] [facility] Course by problem list: 1. Hypokalemia 2/2 Diarrhea 2. Prolonged qTC  54yo female w PMH of HTN and diverticulitis who  presented with several days of diarrhea and weakness, found to be hypokalemic. She also had qTC prolongation secondary to her hypokalemia. She was given potassium IV and PO and IV fluids. Potassium normalized to 3.6. On day 2 her diarrhea had nearly resolved. She was discharged with several days of K supplementation and close follow-up with her PCP.    Discharge Vitals:   BP 106/65 (BP Location: Left Arm)   Pulse 90   Temp 98.2 F (36.8 C) (Oral)   Resp 18   Ht 5' 4"  (1.626 m)   Wt 58.1 kg   LMP 12/15/2014   SpO2 98%   BMI 21.99 kg/m   Pertinent Labs, Studies, and Procedures:    Discharge Instructions: Discharge Instructions    Call MD for:  extreme fatigue   Complete by: As directed    Call MD for:  persistant dizziness or light-headedness   Complete by: As directed    Diet - low sodium heart healthy   Complete by: As directed    Increase activity slowly   Complete by: As directed       Signed: Marty Heck, DO 09/22/2019, 11:39 AM Pager: [phone]

## 2019-09-20 NOTE — Discharge Instructions (Signed)
To Natasha Barrera,  It was a pleasure to take care of you during your stay at [facility]. During your stay you were found to have a loss of potassium due to your diarrhea. We have given you back potassium, and you will be discharged with orders for 5 days worth of potassium supplementation. Please take the medication as labeled on the prescription bottle. Please continue to stay hydrated. It is recommended that you drink an electrolyte drink like Pedialyte.Additionally, you will have a follow up appointment at the [facility] here at [facility], located in the basement. Please come back for reevaluation if you have worsening diarrhea symptoms, or acute changes in your mentation.    Rehydration, Adult Rehydration is the replacement of body fluids and salts and minerals (electrolytes) that are lost during dehydration. Dehydration is when there is not enough fluid or water in the body. This happens when you lose more fluids than you take in. Common causes of dehydration include:  Vomiting.  Diarrhea.  Excessive sweating, such as from heat exposure or exercise.  Taking medicines that cause the body to lose excess fluid (diuretics).  Impaired kidney function.  Not drinking enough fluid.  Certain illnesses or infections.  Certain poorly controlled long-term (chronic) illnesses, such as diabetes, heart disease, and kidney disease.  Symptoms of mild dehydration may include thirst, dry lips and mouth, dry skin, and dizziness. Symptoms of severe dehydration may include increased heart rate, confusion, fainting, and not urinating. You can rehydrate by drinking certain fluids or getting fluids through an IV tube, as told by your health care provider. What are the risks? Generally, rehydration is safe. However, one problem that can happen is taking in too much fluid (overhydration). This is rare. If overhydration happens, it can cause an electrolyte imbalance, kidney failure, or a decrease in  salt (sodium) levels in the body. How to rehydrate Follow instructions from your health care provider for rehydration. The kind of fluid you should drink and the amount you should drink depend on your condition.  If directed by your health care provider, drink an oral rehydration solution (ORS). This is a drink designed to treat dehydration that is found in pharmacies and retail stores. ? Make an ORS by following instructions on the package. ? Start by drinking small amounts, about  cup (120 mL) every 5-10 minutes. ? Slowly increase how much you drink until you have taken the amount recommended by your health care provider.  Drink enough clear fluids to keep your urine clear or pale yellow. If you were instructed to drink an ORS, finish the ORS first, then start slowly drinking other clear fluids. Drink fluids such as: ? Water. Do not drink only water. Doing that can lead to having too little sodium in your body (hyponatremia). ? Ice chips. ? Fruit juice that you have added water to (diluted juice). ? Low-calorie sports drinks.  If you are severely dehydrated, your health care provider may recommend that you receive fluids through an IV tube in the hospital.  Do not take sodium tablets. Doing that can lead to the condition of having too much sodium in your body (hypernatremia). Eating while you rehydrate Follow instructions from your health care provider about what to eat while you rehydrate. Your health care provider may recommend that you slowly begin eating regular foods in small amounts.  Eat foods that contain a healthy balance of electrolytes, such as bananas, oranges, potatoes, tomatoes, and spinach.  Avoid foods that are greasy  or contain a lot of fat or sugar.  In some cases, you may get nutrition through a feeding tube that is passed through your nose and into your stomach (nasogastric tube, or NG tube). This may be done if you have uncontrolled vomiting or diarrhea. Beverages to  avoid Certain beverages may make dehydration worse. While you rehydrate, avoid:  Alcohol.  Caffeine.  Drinks that contain a lot of sugar. These include: ? High-calorie sports drinks. ? Fruit juice that is not diluted. ? Soda.  Check nutrition labels to see how much sugar or caffeine a beverage contains. Signs of dehydration recovery You may be recovering from dehydration if:  You are urinating more often than before you started rehydrating.  Your urine is clear or pale yellow.  Your energy level improves.  You vomit less frequently.  You have diarrhea less frequently.  Your appetite improves or returns to normal.  You feel less dizzy or less light-headed.  Your skin tone and color start to look more normal. Contact a health care provider if:  You continue to have symptoms of mild dehydration, such as: ? Thirst. ? Dry lips. ? Slightly dry mouth. ? Dry, warm skin. ? Dizziness.  You continue to vomit or have diarrhea. Get help right away if:  You have symptoms of dehydration that get worse.  You feel: ? Confused. ? Weak. ? Like you are going to faint.  You have not urinated in 6-8 hours.  You have very dark urine.  You have trouble breathing.  Your heart rate while sitting still is over 100 beats a minute.  You cannot drink fluids without vomiting.  You have vomiting or diarrhea that: ? Gets worse. ? Does not go away.  You have a fever. This information is not intended to replace advice given to you by your health care provider. Make sure you discuss any questions you have with your health care provider. Document Revised: 03/01/2017 Document Reviewed: 05/13/2015 Elsevier Patient Education  [place].    Dehydration, Adult Dehydration is a condition in which there is not enough water or other fluids in the body. This happens when a person loses more fluids than he or she takes in. Important organs, such as the kidneys, brain, and heart,  cannot function without a proper amount of fluids. Any loss of fluids from the body can lead to dehydration. Dehydration can be mild, moderate, or severe. It should be treated right away to prevent it from becoming severe. What are the causes? Dehydration may be caused by:  Conditions that cause loss of water or other fluids, such as diarrhea, vomiting, or sweating or urinating a lot.  Not drinking enough fluids, especially when you are ill or doing activities that require a lot of energy.  Other illnesses and conditions, such as fever or infection.  Certain medicines, such as medicines that remove excess fluid from the body (diuretics).  Lack of safe drinking water.  Not being able to get enough water and food. What increases the risk? The following factors may make you more likely to develop this condition:  Having a long-term (chronic) illness that has not been treated properly, such as diabetes, heart disease, or kidney disease.  Being 36 years of age or older.  Having a disability.  Living in a place that is high in altitude, where thinner, drier air causes more fluid loss.  Doing exercises that put stress on your body for a long time (endurance sports). What are the signs  or symptoms? Symptoms of dehydration depend on how severe it is. Mild or moderate dehydration  Thirst.  Dry lips or dry mouth.  Dizziness or light-headedness, especially when standing up from a seated position.  Muscle cramps.  Dark urine. Urine may be the color of tea.  Less urine or tears produced than usual.  Headache. Severe dehydration  Changes in skin. Your skin may be cold and clammy, blotchy, or pale. Your skin also may not return to normal after being lightly pinched and released.  Little or no tears, urine, or sweat.  Changes in vital signs, such as rapid breathing and low blood pressure. Your pulse may be weak or may be faster than 100 beats a minute when you are sitting  still.  Other changes, such as: ? Feeling very thirsty. ? Sunken eyes. ? Cold hands and feet. ? Confusion. ? Being very tired (lethargic) or having trouble waking from sleep. ? Short-term weight loss. ? Loss of consciousness. How is this diagnosed? This condition is diagnosed based on your symptoms and a physical exam. You may have blood and urine tests to help confirm the diagnosis. How is this treated? Treatment for this condition depends on how severe it is. Treatment should be started right away. Do not wait until dehydration becomes severe. Severe dehydration is an emergency and needs to be treated in a hospital.  Mild or moderate dehydration can be treated at home. You may be asked to: ? Drink more fluids. ? Drink an oral rehydration solution (ORS). This drink helps restore proper amounts of fluids and salts and minerals in the blood (electrolytes).  Severe dehydration can be treated: ? With IV fluids. ? By correcting abnormal levels of electrolytes. This is often done by giving electrolytes through a tube that is passed through your nose and into your stomach (nasogastric tube, or NG tube). ? By treating the underlying cause of dehydration. Follow these instructions at home: Oral rehydration solution If told by your health care provider, drink an ORS:  Make an ORS by following instructions on the package.  Start by drinking small amounts, about  cup (120 mL) every 5-10 minutes.  Slowly increase how much you drink until you have taken the amount recommended by your health care provider. Eating and drinking         Drink enough clear fluid to keep your urine pale yellow. If you were told to drink an ORS, finish the ORS first and then start slowly drinking other clear fluids. Drink fluids such as: ? Water. Do not drink only water. Doing that can lead to hyponatremia, which is having too little salt (sodium) in the body. ? Water from ice chips you suck on. ? Fruit juice  that you have added water to (diluted fruit juice). ? Low-calorie sports drinks.  Eat foods that contain a healthy balance of electrolytes, such as bananas, oranges, potatoes, tomatoes, and spinach.  Do not drink alcohol.  Avoid the following: ? Drinks that contain a lot of sugar. These include high-calorie sports drinks, fruit juice that is not diluted, and soda. ? Caffeine. ? Foods that are greasy or contain a lot of fat or sugar. General instructions  Take over-the-counter and prescription medicines only as told by your health care provider.  Do not take sodium tablets. Doing that can lead to having too much sodium in the body (hypernatremia).  Return to your normal activities as told by your health care provider. Ask your health care provider what activities are  safe for you.  Keep all follow-up visits as told by your health care provider. This is important. Contact a health care provider if:  You have muscle cramps, pain, or discomfort, such as: ? Pain in your abdomen and the pain gets worse or stays in one area (localizes). ? Stiff neck.  You have a rash.  You are more irritable than usual.  You are sleepier or have a harder time waking than usual.  You feel weak or dizzy.  You feel very thirsty. Get help right away if you have:  Any symptoms of severe dehydration.  Symptoms of vomiting, such as: ? You cannot eat or drink without vomiting. ? Vomiting gets worse or does not go away. ? Vomit includes blood or green matter (bile).  Symptoms that get worse with treatment.  A fever.  A severe headache.  Problems with urination or bowel movements, such as: ? Diarrhea that gets worse or does not go away. ? Blood in your stool (feces). This may cause stool to look black and tarry. ? Not urinating, or urinating only a small amount of very dark urine, within 6-8 hours.  Trouble breathing. These symptoms may represent a serious problem that is an emergency. Do not  wait to see if the symptoms will go away. Get medical help right away. Call your local emergency services (911 in the [place] Do not drive yourself to the hospital. Summary  Dehydration is a condition in which there is not enough water or other fluids in the body. This happens when a person loses more fluids than he or she takes in.  Treatment for this condition depends on how severe it is. Treatment should be started right away. Do not wait until dehydration becomes severe.  Drink enough clear fluid to keep your urine pale yellow. If you were told to drink an oral rehydration solution (ORS), finish the ORS first and then start slowly drinking other clear fluids.  Take over-the-counter and prescription medicines only as told by your health care provider.  Get help right away if you have any symptoms of severe dehydration. This information is not intended to replace advice given to you by your health care provider. Make sure you discuss any questions you have with your health care provider. Document Revised: 10/30/2018 Document Reviewed: 10/30/2018 Elsevier Patient Education  [place].    Hypokalemia Hypokalemia means that the amount of potassium in the blood is lower than normal. Potassium is a chemical (electrolyte) that helps regulate the amount of fluid in the body. It also stimulates muscle tightening (contraction) and helps nerves work properly. Normally, most of the body's potassium is inside cells, and only a very small amount is in the blood. Because the amount in the blood is so small, minor changes to potassium levels in the blood can be life-threatening. What are the causes? This condition may be caused by:  Antibiotic medicine.  Diarrhea or vomiting. Taking too much of a medicine that helps you have a bowel movement (laxative) can cause diarrhea and lead to hypokalemia.  Chronic kidney disease (CKD).  Medicines that help the body get rid of excess fluid  (diuretics).  Eating disorders, such as bulimia.  Low magnesium levels in the body.  Sweating a lot. What are the signs or symptoms? Symptoms of this condition include:  Weakness.  Constipation.  Fatigue.  Muscle cramps.  Mental confusion.  Skipped heartbeats or irregular heartbeat (palpitations).  Tingling or numbness. How is this diagnosed? This condition is  diagnosed with a blood test. How is this treated? This condition may be treated by:  Taking potassium supplements by mouth.  Adjusting the medicines that you take.  Eating more foods that contain a lot of potassium. If your potassium level is very low, you may need to get potassium through an IV and be monitored in the hospital. Follow these instructions at home:   Take over-the-counter and prescription medicines only as told by your health care provider. This includes vitamins and supplements.  Eat a healthy diet. A healthy diet includes fresh fruits and vegetables, whole grains, healthy fats, and lean proteins.  If instructed, eat more foods that contain a lot of potassium. This includes: ? Nuts, such as peanuts and pistachios. ? Seeds, such as sunflower seeds and pumpkin seeds. ? Peas, lentils, and lima beans. ? Whole grain and bran cereals and breads. ? Fresh fruits and vegetables, such as apricots, avocado, bananas, cantaloupe, kiwi, oranges, tomatoes, asparagus, and potatoes. ? Orange juice. ? Tomato juice. ? Red meats. ? Yogurt.  Keep all follow-up visits as told by your health care provider. This is important. Contact a health care provider if you:  Have weakness that gets worse.  Feel your heart pounding or racing.  Vomit.  Have diarrhea.  Have diabetes (diabetes mellitus) and you have trouble keeping your blood sugar (glucose) in your target range. Get help right away if you:  Have chest pain.  Have shortness of breath.  Have vomiting or diarrhea that lasts for more than 2  days.  Faint. Summary  Hypokalemia means that the amount of potassium in the blood is lower than normal.  This condition is diagnosed with a blood test.  Hypokalemia may be treated by taking potassium supplements, adjusting the medicines that you take, or eating more foods that are high in potassium.  If your potassium level is very low, you may need to get potassium through an IV and be monitored in the hospital. This information is not intended to replace advice given to you by your health care provider. Make sure you discuss any questions you have with your health care provider. Document Revised: 10/30/2017 Document Reviewed: 10/30/2017 Elsevier Patient Education  [place].

## 2019-09-20 NOTE — Significant Event (Addendum)
   CRITICAL VALUE ALERT  Critical Value:  Potassium 2.6 per Jeanett Schlein  Date & Time Notied:  09/20/2019  0315  Provider Notified: Attending MD Dr. Sheppard Coil  Orders Received/Actions taken: MD said "thank you" Awaiting for orders.   Pt appears in no distress,denied any nausea/vomitting, pt did report 2 small episodes of diarrhea since arrival on 5C16 since 0038.   0500; Pt c/o painful at PIV and IV site noted redness covering surrounding arm area. MD paged regarding issue. Per order to stop IV K+ and MD will order oral K+ placement. Updated pt.

## 2019-09-20 NOTE — Progress Notes (Addendum)
Subjective:  ON Events: Admitted  Patient evaluated at bedside. Her BM have reduced, she has had about five in the last 24 hours. Before this she was having about 7 per day. She is feeling much better. She has not tried imodium. The stools are not as liquidy as before either. She denies nausea and vomiting at this time.   She is currently looking for a PCP and has a first time appointment in August but is agreeable to coming to the [facility] clinic for follow-up.   Objective:  Vital signs in last 24 hours: Vitals:   09/19/19 2156 09/20/19 0037 09/20/19 0500 09/20/19 0511  BP: 97/75 133/76  117/69  Pulse: 87 76  92  Resp: 16 18  16   Temp:  98.7 F (37.1 C)  99.3 F (37.4 C)  TempSrc:  Oral  Oral  SpO2: 99% 99%  94%  Weight:   58.1 kg   Height:       Physical Exam Vitals and nursing note reviewed.  Constitutional:      General: She is not in acute distress.    Appearance: She is normal weight. She is not ill-appearing or toxic-appearing.  Cardiovascular:     Rate and Rhythm: Normal rate and regular rhythm.     Heart sounds: Normal heart sounds. No murmur heard.  No friction rub. No gallop.   Pulmonary:     Effort: Pulmonary effort is normal.     Breath sounds: Normal breath sounds. No wheezing, rhonchi or rales.  Abdominal:     General: Abdomen is flat. Bowel sounds are normal. There is no distension.     Palpations: Abdomen is soft. There is no shifting dullness.     Tenderness: There is no abdominal tenderness. There is no guarding or rebound.  Neurological:     Mental Status: She is alert.     BMP Latest Ref Rng & Units 09/20/2019 09/19/2019 11/27/2018  Glucose 70 - 99 mg/dL 110(H) 113(H) 97  BUN 6 - 20 mg/dL <5(L) <5(L) <5(L)  Creatinine 0.44 - 1.00 mg/dL 0.47 0.62 0.66  Sodium 135 - 145 mmol/L 132(L) 129(L) 133(L)  Potassium 3.5 - 5.1 mmol/L 2.6(LL) 2.1(LL) 3.6  Chloride 98 - 111 mmol/L 94(L) 85(L) 96(L)  CO2 22 - 32 mmol/L 27 29 27   Calcium 8.9 - 10.3 mg/dL 8.0(L)  8.4(L) 9.5     Assessment/Plan:  Active Problems:   Hypokalemia  In summary, Ms. Wease is a 55 year old female with a past medical history significant for hypertension, diverticulitis, and ventral hernia repair who presented to with abdominal discomfort and diarrhea. She is being admitted for fluid resuscitation and electrolyte repletion.    Hypokalemia:  2.1 on admission, with repletion. Will check K at noon to see trend. If K is improved will DC with supplemental potassium for 5 day course with [facility] up at the [facility].  - K: 2.6 - Supplemental K 40 mEq infusion 125 mL/HR - Kdur 40 mEq BID - BMP @ 1200 -Telemetry   Acute Diarrhea: Patient presented with a week of abdominal pain and diarrhea. On the day of admission, she had not had a bowel movement.  - Imodium -Continue to monitor   Prolonged QT 2/2 Hypokalemia - Potassium supplementation -Avoid QT prolonging medications   Elevated beta hCG: Slightly elevated to 17.8.  This can be mildly elevated in menopause patient's.  Low suspicion for pregnancy. -hCG: 13   FEN/GI -Diet: Regular -Fluids: LR DVT prophylaxis -Lovenox 49m subq injections daily  Prior  to Admission Living Arrangement: Home Anticipated Discharge Location: Home Barriers to Discharge: Medical Management Dispo: Anticipated discharge today pending afternoon potassium.   Maudie Mercury, MD 09/20/2019, 6:12 AM Pager: [phone] After 5pm on weekdays and 1pm on weekends: On Call pager [phone]

## 2019-10-02 ENCOUNTER — Observation Stay (HOSPITAL_COMMUNITY): Payer: 59

## 2019-10-02 ENCOUNTER — Encounter: Payer: Self-pay | Admitting: Student

## 2019-10-02 ENCOUNTER — Ambulatory Visit (INDEPENDENT_AMBULATORY_CARE_PROVIDER_SITE_OTHER): Payer: 59 | Admitting: Student

## 2019-10-02 ENCOUNTER — Inpatient Hospital Stay (HOSPITAL_COMMUNITY)
Admission: AD | Admit: 2019-10-02 | Discharge: 2019-10-04 | DRG: 392 | Discharge status: Against Medical Advice | Payer: 59 | Source: Ambulatory Visit | Attending: Internal Medicine | Admitting: Internal Medicine

## 2019-10-02 ENCOUNTER — Telehealth: Payer: Self-pay | Admitting: *Deleted

## 2019-10-02 ENCOUNTER — Encounter (HOSPITAL_COMMUNITY): Payer: Self-pay | Admitting: Internal Medicine

## 2019-10-02 VITALS — BP 113/75 | HR 136 | Temp 98.2°F | Wt 115.3 lb

## 2019-10-02 DIAGNOSIS — Z87891 Personal history of nicotine dependence: Secondary | ICD-10-CM

## 2019-10-02 DIAGNOSIS — R000 Tachycardia, unspecified: Secondary | ICD-10-CM | POA: Diagnosis present

## 2019-10-02 DIAGNOSIS — Z79899 Other long term (current) drug therapy: Secondary | ICD-10-CM

## 2019-10-02 DIAGNOSIS — R630 Anorexia: Secondary | ICD-10-CM

## 2019-10-02 DIAGNOSIS — R140 Abdominal distension (gaseous): Secondary | ICD-10-CM | POA: Diagnosis not present

## 2019-10-02 DIAGNOSIS — K439 Ventral hernia without obstruction or gangrene: Secondary | ICD-10-CM | POA: Diagnosis present

## 2019-10-02 DIAGNOSIS — D539 Nutritional anemia, unspecified: Secondary | ICD-10-CM | POA: Diagnosis present

## 2019-10-02 DIAGNOSIS — Z20822 Contact with and (suspected) exposure to covid-19: Secondary | ICD-10-CM | POA: Diagnosis present

## 2019-10-02 DIAGNOSIS — R109 Unspecified abdominal pain: Secondary | ICD-10-CM | POA: Diagnosis present

## 2019-10-02 DIAGNOSIS — I10 Essential (primary) hypertension: Secondary | ICD-10-CM | POA: Diagnosis present

## 2019-10-02 DIAGNOSIS — E871 Hypo-osmolality and hyponatremia: Secondary | ICD-10-CM | POA: Diagnosis present

## 2019-10-02 DIAGNOSIS — R634 Abnormal weight loss: Secondary | ICD-10-CM | POA: Diagnosis not present

## 2019-10-02 DIAGNOSIS — K50118 Crohn's disease of large intestine with other complication: Secondary | ICD-10-CM

## 2019-10-02 DIAGNOSIS — R197 Diarrhea, unspecified: Secondary | ICD-10-CM

## 2019-10-02 DIAGNOSIS — I959 Hypotension, unspecified: Secondary | ICD-10-CM | POA: Diagnosis not present

## 2019-10-02 DIAGNOSIS — K5010 Crohn's disease of large intestine without complications: Secondary | ICD-10-CM

## 2019-10-02 DIAGNOSIS — E876 Hypokalemia: Secondary | ICD-10-CM | POA: Diagnosis present

## 2019-10-02 DIAGNOSIS — E8809 Other disorders of plasma-protein metabolism, not elsewhere classified: Secondary | ICD-10-CM | POA: Diagnosis present

## 2019-10-02 DIAGNOSIS — K529 Noninfective gastroenteritis and colitis, unspecified: Principal | ICD-10-CM | POA: Diagnosis present

## 2019-10-02 DIAGNOSIS — R739 Hyperglycemia, unspecified: Secondary | ICD-10-CM | POA: Diagnosis present

## 2019-10-02 DIAGNOSIS — I700 Atherosclerosis of aorta: Secondary | ICD-10-CM | POA: Diagnosis present

## 2019-10-02 DIAGNOSIS — Z9049 Acquired absence of other specified parts of digestive tract: Secondary | ICD-10-CM

## 2019-10-02 DIAGNOSIS — E860 Dehydration: Secondary | ICD-10-CM | POA: Diagnosis present

## 2019-10-02 LAB — BASIC METABOLIC PANEL
Anion gap: 14 (ref 5–15)
BUN: 6 mg/dL (ref 6–20)
CO2: 27 mmol/L (ref 22–32)
Calcium: 7.8 mg/dL — ABNORMAL LOW (ref 8.9–10.3)
Chloride: 83 mmol/L — ABNORMAL LOW (ref 98–111)
Creatinine, Ser: 0.7 mg/dL (ref 0.44–1.00)
GFR calc Af Amer: >60 mL/min (ref >60)
GFR calc non Af Amer: >60 mL/min (ref >60)
Glucose, Bld: 140 mg/dL — ABNORMAL HIGH (ref 70–99)
Potassium: 3.8 mmol/L (ref 3.5–5.1)
Sodium: 124 mmol/L — ABNORMAL LOW (ref 135–145)

## 2019-10-02 LAB — SARS CORONAVIRUS 2 BY RT PCR (HOSPITAL ORDER, PERFORMED IN ~~LOC~~ HOSPITAL LAB): SARS Coronavirus 2: NEGATIVE

## 2019-10-02 LAB — TSH: TSH: 1.956 u[IU]/mL (ref 0.350–4.500)

## 2019-10-02 LAB — MAGNESIUM: Magnesium: 2.6 mg/dL — ABNORMAL HIGH (ref 1.7–2.4)

## 2019-10-02 LAB — TROPONIN I (HIGH SENSITIVITY)
Troponin I (High Sensitivity): 13 ng/L (ref <18)
Troponin I (High Sensitivity): 9 ng/L (ref <18)

## 2019-10-02 MED ORDER — IOHEXOL 9 MG/ML PO SOLN: 500.0000 mL | ORAL | Status: AC

## 2019-10-02 MED ORDER — IOHEXOL 300 MG/ML  SOLN
100.0000 mL | Freq: Once | INTRAMUSCULAR | Status: AC | PRN
  Administered 2019-10-02: 100 mL via INTRAVENOUS

## 2019-10-02 MED ORDER — SODIUM CHLORIDE 0.9% FLUSH
10.0000 mL | Freq: Two times a day (BID) | INTRAVENOUS | Status: DC
  Administered 2019-10-02: 10 mL
  Administered 2019-10-03: 20 mL
  Administered 2019-10-03 – 2019-10-04 (×2): 10 mL

## 2019-10-02 MED ORDER — LOPERAMIDE HCL 2 MG PO CAPS: 4.0000 mg | ORAL_CAPSULE | ORAL | Status: DC | PRN

## 2019-10-02 MED ORDER — LACTATED RINGERS IV BOLUS
1000.0000 mL | Freq: Once | INTRAVENOUS | Status: AC
  Administered 2019-10-02: 1000 mL via INTRAVENOUS

## 2019-10-02 MED ORDER — ENOXAPARIN SODIUM 40 MG/0.4ML ~~LOC~~ SOLN
40.0000 mg | SUBCUTANEOUS | Status: DC
  Administered 2019-10-02 – 2019-10-03 (×2): 40 mg via SUBCUTANEOUS
  Filled 2019-10-02 (×2): qty 0.4

## 2019-10-02 MED ORDER — ACETAMINOPHEN 325 MG PO TABS: 650.0000 mg | ORAL_TABLET | Freq: Four times a day (QID) | ORAL | Status: DC | PRN

## 2019-10-02 MED ORDER — IOHEXOL 9 MG/ML PO SOLN
ORAL | Status: AC
  Filled 2019-10-02: qty 500

## 2019-10-02 MED ORDER — LACTATED RINGERS IV SOLN: INTRAVENOUS | Status: DC

## 2019-10-02 MED ORDER — SODIUM CHLORIDE 0.9% FLUSH: 10.0000 mL | INTRAVENOUS | Status: DC | PRN

## 2019-10-02 MED ORDER — ACETAMINOPHEN 650 MG RE SUPP: 650.0000 mg | Freq: Four times a day (QID) | RECTAL | Status: DC | PRN

## 2019-10-02 NOTE — Telephone Encounter (Signed)
per attending's request- pt to be admitted into hospital as a direct admit (pt considered stable to wait at home)  Bed Type-Cardiac Tele Observation Tachycardia with EKG changes  Pt will await call from admitting before arriving to hospital.  Contact numbers for both patient and husband were verified.  Natasha Barrera, Natasha Barrera [place]7/2/20214:17 PM

## 2019-10-02 NOTE — H&P (Signed)
Date: 10/02/2019               Patient Name:  Natasha Barrera MRN: [id number]  DOB: 08-08-1965 Age / Sex: 54 y.o., female   PCP: Lacinda Axon, MD         Medical Service: Internal Medicine Teaching Service         Attending Physician: Dr. Velna Ochs, MD    First Contact: Dr. Virl Axe Pager: [phone]  Second Contact: Dr. Lonia Skinner Pager: [phone]       After Hours (After 5p/  First Contact Pager: [phone]  weekends / holidays): Second Contact Pager: [phone]   Chief Complaint: Fatigue, loss of appetite, bloating, and weight loss for the past month  History of Present Illness: [name] is a 54 yo female with history of hypertension, diverticulitis s/p repair (2013), and ventral hernia repair (2020) presenting with fatigue, bloating, loss of appetite, and a 10-lb weight loss over the past month. Patient states that her GI issues began a month ago after her grandchild "coughed in her face." Within 24 hours thereafter, she began experiencing vague abdominal discomfort, severe diarrhea (7-8 loose stools/day), fatigue and loss of appetite. She denies having experienced black or bloody stools. She did not come to the hospital because she thought that it was "just a stomach bug." However, after it did not self-resolve, she came to the hospital on 09/19/19. She was to found to be hypokalemic and had qTC prolongation 2/2 to her hypokalemia. She was given potassium IV and PO along with IV fluids and was kept overnight. Her potassium normalized on 09/20/19 and her diarrhea also was nearly resolved. She was subsequently discharged with several days of potassium supplementation, imodium and mylanta for her diarrhea, and close follow-up with her PCP. Of note, in the ED, her beta-hCG was found to be elevated to 13.  Today (10/02/19), patient presented to the clinic for a hospital follow up. She noted that her diarrhea has improved with the imodium and that she completed her course  of potassium supplements. Her heart rate was found to be elevated at 136 bpm and her BMP showed hyponatremia, hyperglycemia, and hypocalcemia. Her hypokalemia were now resolved. An EKG showed sinus tachycardia with new T wave inversion in the anterolateral leads with no ST changes. She was subsequently sent to the hospital for admission.  During our interaction, she states that her fatigue, bloating, loss of appetite, and weight loss (of about 10 lbs) have not gone away even after her prior hospital admission. She mentions that she has not been able to tolerate solid foods and has had minimal liquids since discharge. Her husband states that "she used to eat a whole filet mignon steak a month ago but now she doesn't even want me to cook her a filet mignon steak" and mentions that this has been causing him a lot of concern because up until now both of them have been healthy. Patient states that she has been able to regularly pass gas and feels better after passing gas. However, she has not been able to have a fully formed stool (only loose stools) in a while. She states that every once in a while she wakes up in the morning sweaty. Patient denies any black or bloody stools, N/V, fever, chest pain, palpitations, SOB, muscle/joint pain.  Meds:  Current Meds  Medication Sig  . Calcium Carbonate (CALCIUM 500 PO) Take 1 capsule by mouth daily.  . Cholecalciferol (VITAMIN D) 50 MCG (  2000 UT) CAPS Take 2,000 Units by mouth daily.  Marland Kitchen loperamide (IMODIUM A-D) 2 MG tablet Take 1 tablet (2 mg total) by mouth 4 (four) times daily as needed for diarrhea or loose stools.  . Multiple Vitamin (MULTIVITAMIN ADULT PO) Take 1 tablet by mouth daily.  . potassium chloride (KLOR-CON) 10 MEQ tablet Take 2 tablets (20 mEq total) by mouth daily.   Home Meds: Imodium, Mylanta  Allergies: Allergies as of 10/02/2019  . (No Known Allergies)   Past Medical History:  Diagnosis Date  . Diverticul disease small and large  intestine, no perforati or abscess   . Diverticulitis of colon   . Hypertension     Family History: - Father: deceased 2/2 brain tumor - Mother: deceased 2/2 multiple sclerosis  Social History:  - Patient lives with her husband and one of her sons - No history of drug use or alcohol use - Smoking history of 6-8 cigarettes/day for about 25-30 years; stopped smoking about 5 years ago - No history of recent travel  Review of Systems: A complete ROS was negative except as per HPI.  Physical Exam: Blood pressure 90/61, pulse (!) 110, temperature 98.2 F (36.8 C), temperature source Oral, height (!) 5.4" (1.626 m), weight 51.8 kg, last menstrual period 12/15/2014, SpO2 100 %.  Physical Exam Constitutional:      Appearance: Normal appearance.     Comments: Patient sitting up in bed in no apparent acute distress. Pleasant to speak with.  HENT:     Head: Normocephalic and atraumatic.  Cardiovascular:     Rate and Rhythm: Regular rhythm. Tachycardia present.     Heart sounds: Normal heart sounds. No murmur heard.  No friction rub. No gallop.   Pulmonary:     Effort: Pulmonary effort is normal. No respiratory distress.     Breath sounds: Normal breath sounds. No stridor. No wheezing or rhonchi.  Abdominal:     Comments: Soft, slightly distended abdomen with normoactive bowel sounds present in all quadrants. Moderate discomfort laterally on both sides. On palpation, no tenderness at points of palpation, but did experience referred pain laterally. Negative McBurney's test. No masses, guarding, or rebound tenderness.  Neurological:     General: No focal deficit present.     Mental Status: She is oriented to person, place, and time.  Psychiatric:        Mood and Affect: Mood normal.        Behavior: Behavior normal.        Thought Content: Thought content normal.        Judgment: Judgment normal.     EKG: personally reviewed my interpretation is Sinus tachycardia with new T wave  inversions, no ST changes.   Assessment & Plan by Problem: Active Problems:   Abdominal pain  1.  Bloating, loss of appetite, weight loss, fatigue - Ongoing for the past month since viral GI illness began - All symptoms likely interrelated - Bloating/abdominal discomfort, loss of appetite/weight loss, and fatigue during the first two weeks may have been 2/2 viral GI illness.  In the two weeks since prior hospital admission, these symptoms could be attributed to constipation 2/2 increased imodium and mylanta intake. - Given the bloating and relief after flatulence, there is the possibility of IBS. However, the symptoms have only been ongoing for the past month and this is the first time this has happened to her. - Given the weight loss, fatigue, and diarrhea, there is the possibility of IBD. However, there is  no family history for it and this is the first time this has happened to her. - Given her weight loss, loss of appetite, and fatigue, should consider possibility of hyperthyroidism. TSH pending. -Given prior history of diverticulitis s/p repair (in 2013) along with numerous GI symptoms, should consider possibility of another episode of diverticulitis. CBC pending and CT scan of abdomen and pelvis with contrast pending. -Given her weight loss, fatigue, and prior elevated beta-hCG, should consider possibility of genitourinary malignancy. LH, transvaginal U/S, and CT scan of abdomen and pelvis with contrast pending. -Given numerous gastrointestinal symptoms along with weight loss and fatigue, should consider possibility of GI malignancy. CT scan of abdomen and pelvis with contrast pending. - TSH and LH pending - CBC pending - CT abdomen and pelvis with contrast pending - Transvaginal U/S pending  2. Sinus Tachycardia with new T wave inversions - Negative troponin  - On telemetry - Repeat EKG in the morning - Denies chest pain, palpitations, diaphoresis, orthopnea, edema, cough, hemoptysis,  dyspnea  3. Hyponatremia likely 2/2 dehydration in the setting of poor PO intake and diarrhea - Likely hypovolemic hyponatremia which would also explain the tachycardia - Lactated ringers infusion 125 mL/hr IV - CMP pending  4. Diarrhea - Continue imodium as needed - CBC pending to r/o infection  5. Hypertension - per history but not on any home anti-HTN meds - will monitor BP  Dispo: Admit patient to Observation with expected length of stay less than 2 midnights.  Signed: Virl Axe, MD 10/02/2019, 6:52 PM  Pager: [phone] After 5pm on weekdays and 1pm on weekends: On Call pager: [phone]

## 2019-10-02 NOTE — Assessment & Plan Note (Signed)
Patient report early satiety after small meals. Unable to tolerate solid foods and only take in limited amount of liquid foods. Has had unintentional weight loss as a result.   Plan: Assess for possible malignant GI process with CT abdomin/pelvis, vaginal U/S and likely a colonoscopy.

## 2019-10-02 NOTE — Assessment & Plan Note (Signed)
Patient found to have an HR of 136 in clinic. Denies any chest pain, palpitations, SOB, dizziness or blurry vision. EKG shows sinus tachycardia with new T wave inversion in anterolateral leads, no ST changes. Likely secondary to dehydration and malnourishment but would rule ACS  Plan:  Direct admit for telemetry monitoring, fluid replacement and repeat EKG

## 2019-10-02 NOTE — Assessment & Plan Note (Signed)
Patient has had an unintentional weight loss of about 15 lbs in the last 2 weeks. Complains of associated fatigue. The loss of appetite and poor PO intake is contributing to this but will consider the possibility of a malignant process considering her age.   Plan: Admit to inpatient for further assessment of her constellation of symptoms.

## 2019-10-02 NOTE — Hospital Course (Addendum)
Admitted (Not on file)  Allergies: Patient has no known allergies. Pertinent Hx: HTN, ventral hernia repair, diverticulitis  54 y.o. female p/w decreased appetite and bloating  *Decreased PO intake, abd pain, bloating: Noted to have generalzied fatiuge x 2 weeks. Down 15 lbs. Unable to tolerate PO. Dry, dehydrated on exam. Diffuse mild tenderness, no masses. Given the abd issues concern for malignant process. Getting CT abd/pelvis, vagianl u/s, LH.   *Abnormal EKG: Tachycardia and new TWI in inferior leads. Fatigue for 2 weeks. No chest pain or SOB. Unclear etiology. Checking troponin and monitoring on tele.   *Hyponatremia: Na 124, chloride 83. Likely hypovolemic 2/2 decreased PO intakte.   Consults: None  Meds: Imodium VTE ppx: Lovenox IVF: LR Diet: Reg

## 2019-10-02 NOTE — Assessment & Plan Note (Signed)
Patient found to have Na of 124 and chloride of 83 while in clinic. Last Na of 131 at hospital discharge. Hyponatremia likely secondary to dehydration in the setting of poor PO intake and diarrhea. Dry MMM on exam with sunken eyes.   Plan:  Direct admit to hospital for IVF.  Hyponatremia workup Recheck BMP

## 2019-10-02 NOTE — Progress Notes (Signed)
   CC: Hospital f/u  HPI:  Natasha Barrera is a 54 y.o. w/ PMH of HTN, ventral hernia repair and diverticulitis who presented to the clinic for a hospital follow up. In clinic, patient state her diarrhea has improve with the imodium and she has completed her course of potassium pills. She current complains of abdominal bloating and loss of appetite. She has not been able to tolerate solid foods and she has had minimal liquid foods since discharge. She has lost about 15 lbs since she was discharged from the hospital. She has had associated abdominal pain but denies any chest pain, SOB, palpitations, dizziness, headaches, blurry vision, blood in stool and spotting.   In clinic, pt was found to have elevated HR to 136 without any symptoms. BMP showed hyponatremia, hyperglycemia and hypocalcemia. Hypokalemia is now resolved. Magnesium is slight elevated to 2.6.   Past Medical History:  Diagnosis Date  . Diverticul disease small and large intestine, no perforati or abscess   . Diverticulitis of colon   . Hypertension    Review of Systems: As stated in the HPI otherwise negative  Physical Exam: General: Pleasant middle-aged woman sitting on bed. No acute distress. Cachetic and mildly pale in appearance Heads: Normocephalic/traumatic Eyes: Sunken eyes. Pale conjunctiva. HEENT: Dry mucus membrane.  Respiration: Lungs CTAB. No wheezing. No increased WOB. Cardiovascular: Tachycardic. Regular rhythm. No murmurs. No pitting edema. Pulses 2+ in all extremities Abdomen: Generalized tenderness to palpation. Hypoactive bowel sounds. No guarding. No masses or organomegaly MSK: Temporal wasting Neuro: A&O x3. Moves all extremities Psych: Normal affect. Frustrated  Vitals:   10/02/19 1350  BP: 113/75  Pulse: (!) 136  Temp: 98.2 F (36.8 C)  TempSrc: Oral  SpO2: 98%  Weight: 115 lb 4.8 oz (52.3 kg)   EKG findings: EKG in clinic shows sinus tachycardia with T wave inversions in the anterolateral  leads. No ST changes.  Compared to previous EKG (6/21), these are new changes.    Assessment & Plan:   See Encounters Tab for problem based charting.  Patient seen with Dr. Daryll Drown

## 2019-10-02 NOTE — Assessment & Plan Note (Signed)
Patient reported having abdominal bloating for the past week since discharge from the hospital. She has associated generalized abdominal pain and weight loss. Diarrhea has improve on Imodium. Unusually elevated hCG. In addition, her weight loss, loss of appetite and abdominal pain, her presentation is concerning for malignant process in the lower abdomen.   Plan: Obtain LH, CT abdomin/pelvis and vaginal U/S to assess for any malignant process while admitted to the hospital.

## 2019-10-02 NOTE — Assessment & Plan Note (Signed)
Admitted to the hospital on 6/19 and found to have K of 2.1 which improved to 3.6 on discharge. Discharged with KCl 10 mEq for 5 days. K+ improved to 3.8 in clinic.   Hypokalemia resolved. Monitor with BMP while admitted.

## 2019-10-03 DIAGNOSIS — K5010 Crohn's disease of large intestine without complications: Secondary | ICD-10-CM

## 2019-10-03 DIAGNOSIS — Z20822 Contact with and (suspected) exposure to covid-19: Secondary | ICD-10-CM | POA: Diagnosis present

## 2019-10-03 DIAGNOSIS — K529 Noninfective gastroenteritis and colitis, unspecified: Principal | ICD-10-CM

## 2019-10-03 DIAGNOSIS — E860 Dehydration: Secondary | ICD-10-CM | POA: Diagnosis present

## 2019-10-03 DIAGNOSIS — Z79899 Other long term (current) drug therapy: Secondary | ICD-10-CM | POA: Diagnosis not present

## 2019-10-03 DIAGNOSIS — R634 Abnormal weight loss: Secondary | ICD-10-CM | POA: Diagnosis present

## 2019-10-03 DIAGNOSIS — R197 Diarrhea, unspecified: Secondary | ICD-10-CM

## 2019-10-03 DIAGNOSIS — I700 Atherosclerosis of aorta: Secondary | ICD-10-CM | POA: Diagnosis present

## 2019-10-03 DIAGNOSIS — Z9049 Acquired absence of other specified parts of digestive tract: Secondary | ICD-10-CM | POA: Diagnosis not present

## 2019-10-03 DIAGNOSIS — I959 Hypotension, unspecified: Secondary | ICD-10-CM | POA: Diagnosis not present

## 2019-10-03 DIAGNOSIS — E871 Hypo-osmolality and hyponatremia: Secondary | ICD-10-CM | POA: Diagnosis present

## 2019-10-03 DIAGNOSIS — K50118 Crohn's disease of large intestine with other complication: Secondary | ICD-10-CM

## 2019-10-03 DIAGNOSIS — R739 Hyperglycemia, unspecified: Secondary | ICD-10-CM | POA: Diagnosis present

## 2019-10-03 DIAGNOSIS — D539 Nutritional anemia, unspecified: Secondary | ICD-10-CM | POA: Diagnosis present

## 2019-10-03 DIAGNOSIS — E8809 Other disorders of plasma-protein metabolism, not elsewhere classified: Secondary | ICD-10-CM | POA: Diagnosis present

## 2019-10-03 DIAGNOSIS — R000 Tachycardia, unspecified: Secondary | ICD-10-CM | POA: Diagnosis present

## 2019-10-03 DIAGNOSIS — Z87891 Personal history of nicotine dependence: Secondary | ICD-10-CM | POA: Diagnosis not present

## 2019-10-03 DIAGNOSIS — E876 Hypokalemia: Secondary | ICD-10-CM | POA: Diagnosis present

## 2019-10-03 DIAGNOSIS — K439 Ventral hernia without obstruction or gangrene: Secondary | ICD-10-CM | POA: Diagnosis present

## 2019-10-03 DIAGNOSIS — I10 Essential (primary) hypertension: Secondary | ICD-10-CM | POA: Diagnosis present

## 2019-10-03 LAB — C-REACTIVE PROTEIN: CRP: 17.9 mg/dL — ABNORMAL HIGH (ref <1.0)

## 2019-10-03 LAB — COMPREHENSIVE METABOLIC PANEL
ALT: 12 U/L (ref 0–44)
AST: 11 U/L — ABNORMAL LOW (ref 15–41)
Albumin: 1.2 g/dL — ABNORMAL LOW (ref 3.5–5.0)
Alkaline Phosphatase: 72 U/L (ref 38–126)
Anion gap: 9 (ref 5–15)
BUN: <5 mg/dL — ABNORMAL LOW (ref 6–20)
CO2: 29 mmol/L (ref 22–32)
Calcium: 7.3 mg/dL — ABNORMAL LOW (ref 8.9–10.3)
Chloride: 91 mmol/L — ABNORMAL LOW (ref 98–111)
Creatinine, Ser: 0.45 mg/dL (ref 0.44–1.00)
GFR calc Af Amer: >60 mL/min (ref >60)
GFR calc non Af Amer: >60 mL/min (ref >60)
Glucose, Bld: 89 mg/dL (ref 70–99)
Potassium: 3.5 mmol/L (ref 3.5–5.1)
Sodium: 129 mmol/L — ABNORMAL LOW (ref 135–145)
Total Bilirubin: 0.7 mg/dL (ref 0.3–1.2)
Total Protein: 4.1 g/dL — ABNORMAL LOW (ref 6.5–8.1)

## 2019-10-03 LAB — CBC
HCT: 29.7 % — ABNORMAL LOW (ref 36.0–46.0)
Hemoglobin: 10 g/dL — ABNORMAL LOW (ref 12.0–15.0)
MCH: 33.8 pg (ref 26.0–34.0)
MCHC: 33.7 g/dL (ref 30.0–36.0)
MCV: 100.3 fL — ABNORMAL HIGH (ref 80.0–100.0)
Platelets: 435 10*3/uL — ABNORMAL HIGH (ref 150–400)
RBC: 2.96 MIL/uL — ABNORMAL LOW (ref 3.87–5.11)
RDW: 13.3 % (ref 11.5–15.5)
WBC: 5.5 10*3/uL (ref 4.0–10.5)
nRBC: 0 % (ref 0.0–0.2)

## 2019-10-03 LAB — VITAMIN B12: Vitamin B-12: 3898 pg/mL — ABNORMAL HIGH (ref 180–914)

## 2019-10-03 LAB — BASIC METABOLIC PANEL
Anion gap: 11 (ref 5–15)
BUN: 5 mg/dL — ABNORMAL LOW (ref 6–20)
CO2: 26 mmol/L (ref 22–32)
Calcium: 7.3 mg/dL — ABNORMAL LOW (ref 8.9–10.3)
Chloride: 89 mmol/L — ABNORMAL LOW (ref 98–111)
Creatinine, Ser: 0.51 mg/dL (ref 0.44–1.00)
GFR calc Af Amer: >60 mL/min (ref >60)
GFR calc non Af Amer: >60 mL/min (ref >60)
Glucose, Bld: 89 mg/dL (ref 70–99)
Potassium: 3.4 mmol/L — ABNORMAL LOW (ref 3.5–5.1)
Sodium: 126 mmol/L — ABNORMAL LOW (ref 135–145)

## 2019-10-03 LAB — FOLATE: Folate: 9.2 ng/mL (ref >5.9)

## 2019-10-03 NOTE — Plan of Care (Signed)
  Problem: Education: Goal: Knowledge of General Education information will improve Description Including pain rating scale, medication(s)/side effects and non-pharmacologic comfort measures Outcome: Progressing   Problem: Activity: Goal: Risk for activity intolerance will decrease Outcome: Progressing   Problem: Safety: Goal: Ability to remain free from injury will improve Outcome: Progressing   

## 2019-10-03 NOTE — Plan of Care (Signed)
  Problem: Education: Goal: Knowledge of General Education information will improve Description: Including pain rating scale, medication(s)/side effects and non-pharmacologic comfort measures Outcome: Progressing   Problem: Activity: Goal: Risk for activity intolerance will decrease 10/03/2019 0203 by Barton Dubois, RN Outcome: Progressing 10/03/2019 0155 by Barton Dubois, RN Outcome: Progressing   Problem: Safety: Goal: Ability to remain free from injury will improve Outcome: Progressing

## 2019-10-03 NOTE — Consult Note (Signed)
Referring Provider:  [facility]         Primary Care Physician:  Lacinda Axon, MD Primary Gastroenterologist:  Althia Forts.            We were asked to see this patient for:  Previously [facility] ( Doesn't want to see)                ASSESSMENT /  PLAN    Natasha Barrera is a 54 y.o. female PMH significant for, but not necessarily limited to,  Diverticulitis status post colon resection, hypertension.                                                                                                                                    # Diarrhea /weight loss / elevated CRP/ colitis on CT scan --This could be infectious colitis though duration of it is a little concerning.  --Await stool studies --We discussed colonoscopy which patient is open to having but she would like to be discharged and continue work-up as an outpatient.  I have concerns about the potential for recurrent electrolyte abnormalities, worsening malnutrition but patient is adamant that she is managing at home.  With Imodium she is having only 1-2 bowel movements a day and says that she is able to eat now that the diarrhea is under control.   --Dr. Tarri Glenn will see patient this afternoon   # Severe hypoalbuminemia.  --Possible reactive but also malnourishment  # Electrolyte abnormalities secondary to poor PO intake and diarrhea --Improving but no resolved.  # Hx of diverticulitis, s/p sigmoid colectomy in 2013     HPI:    Chief Complaint: diarrhea   Natasha Barrera is a 54 y.o. female who was admitted 09/19/2019 with diarrhea, weight loss and electrolyte abnormalities.  Symptoms felt to be viral in nature, she responded to conservative measures and was discharged home within a day or 2 .     10/02/19 -Patient went for hospital follow-up at PCPs office yesterday.  She complained of bloating, loss of appetite over the last month.  Patient and husband were concerned about a malignancy as several of their  neighbors had developed some sort of cancer recently.. She agreed to be hospitalized overnight for further testing.  On admission her sodium was 124, potassium 3.8, renal function was normal.  WBC 5.5 hemoglobin 10, MCV 100.3, folate 9.3, B12 3800,.platelets 435, CRP 17.9. Marland Kitchen  Pelvic ultrasound negative.  CT scan of the abdomen and pelvis with contrast suggested colitis involving the ascending, transverse and descending colon.  Some improvement in sodium today at 129.  Her albumin is 1.2   Patient says her GI symptoms started about a month or so ago with nausea and loss of appetite.  She attributed symptoms to a virus.  She uses well water.  No recent antibiotics.  She began having nonbloody diarrhea several times a day.  Diarrhea mainly  postprandial but some nocturnal stooling as well.  Denies fever.  No nausea nor vomiting.  She has lost 10 to 15 pounds since her GI symptoms started.  She started taking Imodium over the last couple weeks and now only having 1-2 bowel movements a day.  No history of IBD in the family.  Patient had a colonoscopy several years ago by [facility].  Following that she ended up having a colon resection for diverticulitis. Patient / husband refuse to be seen by [facility] again  Patient and husband ask about discharge.  They daily agreed to stay overnight in the hospital to have some tests run to rule out cancer.  Patient says that over the last 2 weeks she has been managing diarrhea nicely with Imodium and/or Mylanta.  Patient says she is able to eat now since the diarrhea is under control.  She would like to have colonoscopy done as an outpatient.  Stool studies are pending  PREVIOUS ENDOSCOPIC EVALUATIONS / GI STUDIES :  Past Medical History:  Diagnosis Date  . Diverticul disease small and large intestine, no perforati or abscess   . Diverticulitis of colon   . Hypertension     Past Surgical History:  Procedure Laterality Date  . COLON SURGERY  12/25/2011  . COLONOSCOPY      . [place]  2002  . VENTRAL HERNIA REPAIR N/A 12/05/2018   Procedure: LAPAROSCOPIC VENTRAL HERNIA REPAIR WITH MESH;  Surgeon: Jovita Kussmaul, MD;  Location: [place];  Service: General;  Laterality: N/A;    Prior to Admission medications   Medication Sig Start Date End Date Taking? Authorizing Provider  Calcium Carbonate (CALCIUM 500 PO) Take 1 capsule by mouth daily.   Yes [provider]  Cholecalciferol (VITAMIN D) 50 MCG (2000 UT) CAPS Take 2,000 Units by mouth daily.   Yes [provider]  loperamide (IMODIUM A-D) 2 MG tablet Take 1 tablet (2 mg total) by mouth 4 (four) times daily as needed for diarrhea or loose stools. 09/20/19  Yes Maudie Mercury, MD  Multiple Vitamin (MULTIVITAMIN ADULT PO) Take 1 tablet by mouth daily.   Yes [provider]  potassium chloride (KLOR-CON) 10 MEQ tablet Take 2 tablets (20 mEq total) by mouth daily. 09/20/19  Yes Maudie Mercury, MD    Current Facility-Administered Medications  Medication Dose Route Frequency Provider Last Rate Last Admin  . acetaminophen (TYLENOL) tablet 650 mg  650 mg Oral Q6H PRN Asencion Noble, MD       Or  . acetaminophen (TYLENOL) suppository 650 mg  650 mg Rectal Q6H PRN Asencion Noble, MD      . enoxaparin (LOVENOX) injection 40 mg  40 mg Subcutaneous Q24H Asencion Noble, MD   40 mg at 10/02/19 2124  . lactated ringers infusion   Intravenous Continuous Asencion Noble, MD 125 mL/hr at 10/03/19 0918 New Bag at 10/03/19 4709  . loperamide (IMODIUM) capsule 4 mg  4 mg Oral PRN Asencion Noble, MD      . sodium chloride flush (NS) 0.9 % injection 10-40 mL  10-40 mL Intracatheter Q12H Velna Ochs, MD   20 mL at 10/03/19 0922  . sodium chloride flush (NS) 0.9 % injection 10-40 mL  10-40 mL Intracatheter PRN Velna Ochs, MD        Allergies as of 10/02/2019  . (No Known Allergies)    Family History  Problem Relation Age of Onset  . Breast cancer Neg Hx  Social History   Socioeconomic History  . Marital status: Married    Spouse name: Not on file  . Number of children: Not on file  . Years of education: Not on file  . Highest education level: Not on file  Occupational History  . Not on file  Tobacco Use  . Smoking status: Former Research scientist (life sciences)  . Smokeless tobacco: Never Used  Vaping Use  . Vaping Use: Never used  Substance and Sexual Activity  . Alcohol use: Yes    Comment: occasional  . Drug use: No  . Sexual activity: Not on file  Other Topics Concern  . Not on file  Social History Narrative  . Not on file   Social Determinants of Health   Financial Resource Strain:   . Difficulty of Paying Living Expenses:   Food Insecurity:   . Worried About Charity fundraiser in the Last Year:   . Arboriculturist in the Last Year:   Transportation Needs:   . Film/video editor (Medical):   Marland Kitchen Lack of Transportation (Non-Medical):   Physical Activity:   . Days of Exercise per Week:   . Minutes of Exercise per Session:   Stress:   . Feeling of Stress :   Social Connections:   . Frequency of Communication with Friends and Family:   . Frequency of Social Gatherings with Friends and Family:   . Attends Religious Services:   . Active Member of Clubs or Organizations:   . Attends Archivist Meetings:   Marland Kitchen Marital Status:   Intimate Partner Violence:   . Fear of Current or Ex-Partner:   . Emotionally Abused:   Marland Kitchen Physically Abused:   . Sexually Abused:     Review of Systems: All systems reviewed and negative except where noted in HPI.  Physical Exam: Vital signs in last 24 hours: Temp:  [97.6 F (36.4 C)-98.3 F (36.8 C)] 97.8 F (36.6 C) (07/03 1146) Pulse Rate:  [79-136] 85 (07/03 1146) Resp:  [18-19] 18 (07/03 1146) BP: (88-113)/(55-75) 97/60 (07/03 1146) SpO2:  [92 %-100 %] 98 % (07/03 1146) Weight:  [51.7 kg-52.3 kg] 51.7 kg (07/03 0123) Last BM Date: 10/02/19 General:   Alert, well-developed,  female  in NAD Psych:  Pleasant, cooperative. Normal mood and affect. Eyes:  Pupils equal, sclera clear, no icterus.   Conjunctiva pink. Ears:  Normal auditory acuity. Nose:  No deformity, discharge,  or lesions. Neck:  Supple; no masses Lungs:  Clear throughout to auscultation.   No wheezes, crackles, or rhonchi.  Heart:  Regular rate and rhythm; no murmurs, no lower extremity edema Abdomen:  Soft, non-distended, mild LUQ tenderness. BS active, no palp mass   Rectal:  Deferred  Msk:  Symmetrical without gross deformities. . Neurologic:  Alert and  oriented x4;  grossly normal neurologically. Skin:  Intact without significant lesions or rashes.   Intake/Output from previous day: 07/02 0701 - 07/03 0700 In: 794.2 [P.O.:240; I.V.:554.2] Out: -  Intake/Output this shift: Total I/O In: -  Out: 500 [Urine:500]  Lab Results: Recent Labs    10/03/19 0700  WBC 5.5  HGB 10.0*  HCT 29.7*  PLT 435*   BMET Recent Labs    10/02/19 1409 10/03/19 0700  NA 124* 129*  K 3.8 3.5  CL 83* 91*  CO2 27 29  GLUCOSE 140* 89  BUN 6 <5*  CREATININE 0.70 0.45  CALCIUM 7.8* 7.3*   LFT Recent Labs    10/03/19 0700  PROT 4.1*  ALBUMIN 1.2*  AST 11*  ALT 12  ALKPHOS 72  BILITOT 0.7   PT/INR No results for input(s): LABPROT, INR in the last 72 hours. Hepatitis Panel No results for input(s): HEPBSAG, HCVAB, HEPAIGM, HEPBIGM in the last 72 hours.   . CBC Latest Ref Rng & Units 10/03/2019 09/20/2019 09/19/2019  WBC 4.0 - 10.5 K/uL 5.5 5.7 9.5  Hemoglobin 12.0 - 15.0 g/dL 10.0(L) 13.0 14.5  Hematocrit 36 - 46 % 29.7(L) 37.4 41.7  Platelets 150 - 400 K/uL 435(H) 498(H) 597(H)    . CMP Latest Ref Rng & Units 10/03/2019 10/02/2019 09/20/2019  Glucose 70 - 99 mg/dL 89 140(H) 118(H)  BUN 6 - 20 mg/dL <5(L) 6 <5(L)  Creatinine 0.44 - 1.00 mg/dL 0.45 0.70 0.51  Sodium 135 - 145 mmol/L 129(L) 124(L) 131(L)  Potassium 3.5 - 5.1 mmol/L 3.5 3.8 3.6  Chloride 98 - 111 mmol/L 91(L) 83(L) 94(L)  CO2 22  - 32 mmol/L 29 27 27   Calcium 8.9 - 10.3 mg/dL 7.3(L) 7.8(L) 7.9(L)  Total Protein 6.5 - 8.1 g/dL 4.1(L) - -  Total Bilirubin 0.3 - 1.2 mg/dL 0.7 - -  Alkaline Phos 38 - 126 U/L 72 - -  AST 15 - 41 U/L 11(L) - -  ALT 0 - 44 U/L 12 - -   Studies/Results: US PELVIS TRANSVAGINAL NON-OB (TV ONLY)  Result Date: 10/02/2019 CLINICAL DATA:  Abdominal pain with bloating and unexplained weight loss. EXAM: TRANSABDOMINAL ULTRASOUND OF PELVIS DOPPLER ULTRASOUND OF OVARIES TECHNIQUE: Transabdominal ultrasound examination of the pelvis was performed including evaluation of the uterus, ovaries, adnexal regions, and pelvic cul-de-sac. Color and duplex Doppler ultrasound was utilized to evaluate blood flow to the ovaries. COMPARISON:  None. FINDINGS: Uterus Measurements: 5.2 cm x 3.0 cm x 4.5 cm = volume: 35.76 mL. No fibroids or other mass visualized. Endometrium Thickness: 2.2 mm.  No focal abnormality visualized. Right ovary Measurements: 1.4 cm x 1.1 cm x 1.7 cm = volume: 1.30 mL. Normal appearance/no adnexal mass. Left ovary Measurements: 1.9 cm x 0.9 cm x 1.3 cm = volume: 1.19 mL. Normal appearance/no adnexal mass. Pulsed Doppler evaluation demonstrates normal low-resistance arterial and venous waveforms in both ovaries. Other: No pelvic free fluid is identified. IMPRESSION: Normal pelvic ultrasound. Electronically Signed   By: Virgina Norfolk M.D.   On: 10/02/2019 21:08   CT ABDOMEN PELVIS W CONTRAST  Result Date: 10/02/2019 CLINICAL DATA:  Nausea and vomiting.  Bloating for 3 days. EXAM: CT ABDOMEN AND PELVIS WITH CONTRAST TECHNIQUE: Multidetector CT imaging of the abdomen and pelvis was performed using the standard protocol following bolus administration of intravenous contrast. CONTRAST:  165m OMNIPAQUE IOHEXOL 300 MG/ML  SOLN COMPARISON:  Pelvic ultrasound earlier today. Abdominal CT 10/25/2011 FINDINGS: Lower chest: The lung bases are clear. Hepatobiliary: Few scattered tiny hepatic hypodensities that are  too small to accurately characterize, grossly stable from 2013 exam. No suspicious hepatic lesion. Gallbladder physiologically distended, no calcified stone. No biliary dilatation. Pancreas: No ductal dilatation or inflammation. Spleen: Normal in size without focal abnormality.  Small splenule. Adrenals/Urinary Tract: Left adrenal thickening without dominant nodule. Normal right adrenal gland. Prominence of bilateral renal pelvis without frank hydronephrosis. No perinephric edema. No focal renal lesion. Homogeneous renal enhancement. Urinary bladder is nondistended and not well evaluated. Stomach/Bowel: Stomach and duodenum are unremarkable. There is no small bowel obstruction or inflammation. Appendix not definitively visualized, no appendicitis. Cecum is located in the deep pelvic midline. Fluid-filled cecum with mild wall enhancement.  There is wall thickening of the ascending, transverse, and descending colon with mild gaseous colonic distension. Mild adjacent perienteric fat stranding. Enteric chain sutures in the sigmoid colon. Vascular/Lymphatic: Prominent aortic atherosclerosis for age. No aneurysm. There is bi-iliac atherosclerosis. Celiac, superior mesenteric, and inferior mesenteric arteries are patent. The portal vein is patent. There is no portal venous or mesenteric gas. Few prominent retroperitoneal nodes without bulky adenopathy. Reproductive: Uterus is unremarkable.  There is no adnexal mass. Other: No ascites, free air, or abscess. Postsurgical change of the anterior abdominal wall. Musculoskeletal: There are no acute or suspicious osseous abnormalities. IMPRESSION: Colitis involving the ascending, transverse, and descending colon with mild associated gaseous colonic distension. This may be infectious or inflammatory. Aortic Atherosclerosis (ICD10-I70.0). Electronically Signed   By: Keith Rake M.D.   On: 10/02/2019 23:45    Principal Problem:   Colitis Active Problems:   Loss of  weight    Tye Savoy, NP-C @  10/03/2019, 1:48 PM

## 2019-10-03 NOTE — Plan of Care (Signed)
  Problem: Activity: Goal: Risk for activity intolerance will decrease Outcome: Progressing   Problem: Coping: Goal: Level of anxiety will decrease Outcome: Progressing   Problem: Safety: Goal: Ability to remain free from injury will improve Outcome: Progressing   

## 2019-10-03 NOTE — Progress Notes (Signed)
Subjective: Examined Natasha Barrera at bedside. She was lying in bed after having eaten some breakfast. States that she felt hungry earlier and was able to eat. She has not gone to stool since last night.  Discussed with her the CT findings of colitis. She states that she had colitis one time before when she was a teenager and in a stressful period of her life, but ever since then has never experienced it again. Also discussed that GI will be consulted in order to determine the cause of her colitis and mentioned to her that it was likely secondary to inflammation.  She states that she had a colonoscopy once in 2013 around the time of her diagnosis of diverticulitis.   Objective:  Vital signs in last 24 hours: Vitals:   10/02/19 2130 10/03/19 0123 10/03/19 0330 10/03/19 0746  BP: (!) 88/57 (!) 92/57 (!) 94/55 90/61  Pulse: 92 90 79 91  Resp: 19 19 19 18   Temp: 97.6 F (36.4 C) 98.1 F (36.7 C) 98 F (36.7 C) 98.3 F (36.8 C)  TempSrc: Oral Oral Oral Oral  SpO2: 96% 92% 94% 96%  Weight:  51.7 kg    Height:       Physical Exam Constitutional:      Appearance: Normal appearance.  Cardiovascular:     Rate and Rhythm: Normal rate and regular rhythm.     Pulses: Normal pulses.     Heart sounds: Normal heart sounds. No murmur heard.  No friction rub. No gallop.      Comments: No tachycardia noted today. Pulmonary:     Effort: Pulmonary effort is normal. No respiratory distress.     Breath sounds: Normal breath sounds. No stridor. No wheezing or rhonchi.  Abdominal:     Comments: Soft, slightly distended abdomen with normoactive to slightly hyperactive bowel sounds present in all quadrants. Did not complain of any discomfort upon abdominal palpation today. Negative McBurney's test. No masses, guarding, or rebound tenderness.  Neurological:     Mental Status: She is alert.     Assessment/Plan:  Active Problems:   Abdominal pain  1. Colitis 2/2 inflammation (more likely) vs  infection - CT Abd Pelvis w/contrast - showing colitis involving ascending, transverse, and descending colon with mild gaseous colonic distension - GI symptoms (NB diarrhea, bloating, abd pain, decreased PO intake) and weight loss ongoing for about one month - CRP elevated 17.9 - Alarm symptoms (weight loss, age>50) present; GI is on board and will evaluate need for colonoscopy - Normal TSH - No leukocytosis, fever - GI path panel pending - Fecal calprotectin pending - Continue IV fluids  2.  Sinus tachycardia with new T wave inversions - Tachycardia likely 2/2 dehydration in the setting of decreased PO intake and diarrhea - Negative trops x2 - Has resolved after administration of fluids - today HR 70s-90s - EKG on 10/03/19 is pending  3. Hyponatremia likely 2/2 dehydration in the setting of poor PO intake and diarrhea - Likely hypovolemic hyponatremia - Sodium 124 --> 129 - Continue LR infusion - Continue to monitor sodium  4. Diarrhea - Continue imodium as needed - No leukocytosis - GI path panel pending - Fecal calprotectin pending  5. CT finding of prominent aortic atherosclerosis for age - Patient not on any antiplatelet meds or any lipid-lowering drugs - Follow up outpatient for statins and aspirin/clopidogrel as recommended by PCP  6. Hypertension - per history but not on any home meds - will monitor BP  Prior  to Admission Living Arrangement: Lives with husband and son Barriers to Discharge: further workup of colitis  Virl Axe, MD 10/03/2019, 11:38 AM Pager: [phone] After 5pm on weekdays and 1pm on weekends: On Call pager [phone]

## 2019-10-03 NOTE — Discharge Summary (Addendum)
Name: Natasha Barrera MRN: [id number] DOB: [birth year] 54 y.o. PCP: Lacinda Axon, MD  Date of Admission: 10/02/2019  5:53 PM Date of Discharge: 10/04/2019 Attending Physician: Velna Ochs, MD  Discharge Diagnosis: 1. Colitis 2. Hypokalemia 3. Hyponatremia 4. Hypocalcemia 5. Sinus tachycardia, TWI  Discharge Medications: No changes to home medications made. Patient left AMA.   Disposition and follow-up:   Natasha Barrera was discharged from [facility] in Serious condition.  At the hospital follow up visit please address:  1.  Colitis: Infectious vs inflammatory. F/u GI panel, fecal calprotectin, TTG and IgA. Please make sure she follows up with GI, will need colonoscopy in future. Patient was hypotensive on last day, left AMA, please assess BP. Hypokalemia, hyponatremia: Please repeat labs.  Hypocalcemia: Please treat and repeat labs  2.  Labs / imaging needed at time of follow-up: BMP, CBC  3.  Pending labs/ test needing follow-up:  GI panel, fecal calprotectin, TTG and IgA.  Follow-up Appointments:  Follow-up Information     Lacinda Axon, MD. Schedule an appointment as soon as possible for a visit in 1 week(s).   Specialty: Student Contact information: [place] [id number] [address] Hospital Course by problem list: 1. Colitis: This is a 54 year old female with a history of HTN, diverticulitis s/p sigmoid colectomy in 2013, ventral hernia (repaired), and prior episode of some type of colitis in her teens who presented with a 1 month history of abdominal discomfort, diarrhea, weight loss, electrolyte abnormalities and hypoalbuminemia. Patient concerned about malignancy and was wanting evaluation for this. Transvaginal ultrasound negative. LH normal. CRP elevated to 18. Noted to have diffuse colitis involving ascending, transverse and descending colon on CT scan. GI consulted, could still be  infectious however duration was concerning, recommended inpatient colonoscopy., follow up stool studies. Patient was wanting to go home. Informed patient of the recommendations to stay inpatient, she was still getting supplemental potassium, was going to have calcium replaced, and was still receiving IV fluids. Informed that we recommended continued inpatient evaluation and treatment. Patient was aware of the risks and patient left AMA. Patient will follow up with [facility] to discuss results of GI panel and labs. Patient needs follow up with GI for colonoscopy.   2. Hypokalemia 3. Hyponatremia: Likely 2/2 the colitis and decreased PO intake. She was on supplemental K at home prior to admission. Continued to be hypokalemic and needing supplemental K. K 3.2 on day that patient left. Na initially down to 124, improved to 130 on day that patient left.   4. Hypocalcemia: Severe, 7.3. Patient left AMA before we were able to give IV calcium repletion.   5. Sinus tachycardia, TWI: Noted on initial EKG, new TWI in inferior leads. No chest pain or SOB noted. Trops normal. HR and TWI resolved with IV fluids.   Discharge Vitals:   BP (!) 95/55 (BP Location: Right Arm)   Pulse 90   Temp 97.7 F (36.5 C) (Oral)   Resp 18   Ht (!) 5.1" (0.13 m)   Wt 54.4 kg Comment: scale b  LMP 12/15/2014   SpO2 94%   BMI 3241.02 kg/m   Pertinent Labs, Studies, and Procedures:  CBC Latest Ref Rng & Units 10/03/2019 09/20/2019 09/19/2019  WBC 4.0 - 10.5 K/uL 5.5 5.7 9.5  Hemoglobin 12.0 - 15.0 g/dL 10.0(L) 13.0 14.5  Hematocrit 36 -  46 % 29.7(L) 37.4 41.7  Platelets 150 - 400 K/uL 435(H) 498(H) 597(H)   BMP Latest Ref Rng & Units 10/04/2019 10/03/2019 10/03/2019  Glucose 70 - 99 mg/dL 88 89 89  BUN 6 - 20 mg/dL <5(L) 5(L) <5(L)  Creatinine 0.44 - 1.00 mg/dL 0.43(L) 0.51 0.45  Sodium 135 - 145 mmol/L 130(L) 126(L) 129(L)  Potassium 3.5 - 5.1 mmol/L 3.3(L) 3.4(L) 3.5  Chloride 98 - 111 mmol/L 95(L) 89(L) 91(L)  CO2 22 - 32  mmol/L 26 26 29   Calcium 8.9 - 10.3 mg/dL 7.3(L) 7.3(L) 7.3(L)   10/02/19 vaginal ultrasound: IMPRESSION: Normal pelvic ultrasound.   10/02/19 CT abd/pelvis: IMPRESSION: Colitis involving the ascending, transverse, and descending colon with mild associated gaseous colonic distension. This may be infectious or inflammatory.   Aortic Atherosclerosis (ICD10-I70.0).  Discharge Instructions:    Signed: Asencion Noble, MD 10/04/2019, 6:47 PM   Pager: [phone]

## 2019-10-04 LAB — GASTROINTESTINAL PANEL BY PCR, STOOL (REPLACES STOOL CULTURE)

## 2019-10-04 LAB — BASIC METABOLIC PANEL
Anion gap: 9 (ref 5–15)
BUN: <5 mg/dL — ABNORMAL LOW (ref 6–20)
CO2: 26 mmol/L (ref 22–32)
Calcium: 7.3 mg/dL — ABNORMAL LOW (ref 8.9–10.3)
Chloride: 95 mmol/L — ABNORMAL LOW (ref 98–111)
Creatinine, Ser: 0.43 mg/dL — ABNORMAL LOW (ref 0.44–1.00)
GFR calc Af Amer: >60 mL/min (ref >60)
GFR calc non Af Amer: >60 mL/min (ref >60)
Glucose, Bld: 88 mg/dL (ref 70–99)
Potassium: 3.3 mmol/L — ABNORMAL LOW (ref 3.5–5.1)
Sodium: 130 mmol/L — ABNORMAL LOW (ref 135–145)

## 2019-10-04 LAB — LUTEINIZING HORMONE: LH: 2.5 m[IU]/mL

## 2019-10-04 LAB — LACTATE DEHYDROGENASE: LDH: 79 U/L — ABNORMAL LOW (ref 98–192)

## 2019-10-04 MED ORDER — POTASSIUM CHLORIDE CRYS ER 20 MEQ PO TBCR
40.0000 meq | EXTENDED_RELEASE_TABLET | Freq: Two times a day (BID) | ORAL | Status: DC
  Administered 2019-10-04: 40 meq via ORAL
  Filled 2019-10-04: qty 2

## 2019-10-04 MED ORDER — CALCIUM GLUCONATE-NACL 2-0.675 GM/100ML-% IV SOLN
2.0000 g | Freq: Once | INTRAVENOUS | Status: DC
  Filled 2019-10-04: qty 100

## 2019-10-04 NOTE — Progress Notes (Signed)
Patient and husband reported that she wanted to leave AMA.  They reported that they do not want to wait for the lab studies and stool studies to come back.  He did not feel that the diet that she has been getting was good for her colitis.  That we are not doing anything to treat the inflammation.  We discussed that her blood pressures also been very low, potassium is still low calcium is still low.  They stated that her blood pressure is low because she did not eat anything yesterday.  I informed them that the risk of bleeding continuing to have low potassium and low blood pressures can cause multiple issues including heart dysfunction and death. They expressed understanding and are still wanting to leave AMA. They reported that they will follow up in the clinic for the lab results and repeat labs.

## 2019-10-04 NOTE — Progress Notes (Signed)
Patient not seen. Nursing notes reviewed. Despite our recommendations to await stool studies and if negative proceed with colonoscopy, patient has decided to leave AMA. She can call our office to arrange for hospital follow up.

## 2019-10-04 NOTE — Progress Notes (Signed)
Patient states she wants to go home today. Patient states "I feel like they are sucking life out of [place]". Patient's spouse states he wants patient discharged and do follow up with internal medicine on Tuesday.  Marcille Blanco, RN

## 2019-10-04 NOTE — Progress Notes (Signed)
   Subjective: Patient seen at bedside. Reports no pain,bloating, wanting to eat, 1 episode of loose stool yesterday. Reports gaining some weight while she has been here. Mentions worry about paying for hospital stay, frustrated with expenses and multiple tests and having to stay here vs recovering at home.  Willing to talk to social worker   Discussed risks of going home would be continuing to have diarrhea and not having fluids or replacement of electrolytes. Became tearful during discussion. Has thoughts of leaving on own.   Objective:  Vital signs in last 24 hours: Vitals:   10/03/19 0746 10/03/19 1146 10/03/19 1954 10/04/19 0431  BP: 90/61 97/60 (!) 92/59 (!) 85/46  Pulse: 91 85 85 79  Resp: 18 18 18 18   Temp: 98.3 F (36.8 C) 97.8 F (36.6 C) 98.3 F (36.8 C) 98 F (36.7 C)  TempSrc: Oral Oral Oral Oral  SpO2: 96% 98% 94% 95%  Weight:    54.4 kg  Height:       General: Middle aged female, intermittent tearfulness, NAD Cardiac: RRR, no m/r/g Pulmonary: CTABL, no wheezing, rhonchi, rales Abdomen: Soft, non-tender, non-distended, normoactive bowel sounds, no masses  Assessment/Plan:  Principal Problem:   Colitis Active Problems:   Loss of weight   Diarrhea  This is a 54 year old female with a history of HTN, diverticulitis s/p sigmoid colectomy in 2013, ventral hernia (repaired), and prior episode of some type of colitis in her teens who presented with a 1 month history of abdominal discomfort, diarrhea, weight loss, and electrolyte abnormalities. Noted to have colitis on CT scan.   Colitis: Unclear etiology, infectious vs inflammatory. GI panel and fecal calproctin pending. GI following and recommending inpatient colonoscopy. Reports improvement in her abdominal discomfort and appetite. Had a small bowel movement today that continued to be watery. She expressed concerns about cost of hospitalization and continued treatment in the hospital. Discussed that she is still on  fluids and requiring potassium and that we would recommend continued treatment in the hospital. She may still want to leave, discussed the risks and highly encouraged to stay for further evaluation.   -F/u GI pathogen panel and fecal calproctin -Consult social work -GI following, appreciate assistance -Continue IV fluids -Continue repleting electrolytes  Hypokalemia Hyponatremia: Likely 2/2 the colitis and decreased PO intake. K today is down to 3.4. Na improved to 130 today. She continues to be on IV fluids and K supplementation. -K-dur 30 mg BID -Continue IV fluids  Prior to Admission Living Arrangement: Home Anticipated Discharge Location: Home Barriers to Discharge: Continued medical treatment Dispo: Anticipated discharge in approximately 2-3 day(s).   Asencion Noble, MD 10/04/2019, 7:02 AM Pager: [phone] After 5pm on weekdays and 1pm on weekends: On Call pager [phone]

## 2019-10-04 NOTE — Progress Notes (Signed)
Patient left AMA at this time accompanied by her spouse. Marcille Blanco, RN

## 2019-10-04 NOTE — Progress Notes (Addendum)
Patient states she wants to leave AMA. MD paged. Marcille Blanco, RN   MD returned page, will come to the unit and talk to patient. Marcille Blanco, RN

## 2019-10-06 LAB — IGA: IgA: 248 mg/dL (ref 87–352)

## 2019-10-06 LAB — TISSUE TRANSGLUTAMINASE, IGA: Tissue Transglutaminase Ab, IgA: <2 U/mL (ref 0–3)

## 2019-10-06 NOTE — Progress Notes (Signed)
Internal Medicine Clinic Attending  I saw and evaluated the patient.  I personally confirmed the key portions of the history and exam documented by Dr. Amponsah and I reviewed pertinent patient test results.  The assessment, diagnosis, and plan were formulated together and I agree with the documentation in the resident's note.  

## 2019-10-08 ENCOUNTER — Encounter: Payer: Self-pay | Admitting: Nurse Practitioner

## 2019-10-08 LAB — CALPROTECTIN, FECAL: Calprotectin, Fecal: 17356 ug/g — ABNORMAL HIGH (ref 0–120)

## 2019-10-08 NOTE — Addendum Note (Signed)
Addended byLinwood Dibbles on: 10/08/2019 02:07 PM   Modules accepted: Orders

## 2019-10-08 NOTE — Addendum Note (Signed)
Addended byLinwood Dibbles on: 10/08/2019 02:34 PM   Modules accepted: Orders

## 2019-10-08 NOTE — Progress Notes (Signed)
Called and spoke with patient about lab findings during hospital admission. Expressed to patient that considering all the information from her imaging, labs and stool studies, the next course of action is a colonoscopy. Patient states she is now doing better and willing to get a colonoscopy done.   A referral to the endoscopy center has been sent for a colonoscopy. Patient has an upcoming appt with [facility] on August 27th.

## 2019-10-18 ENCOUNTER — Emergency Department (HOSPITAL_COMMUNITY): Payer: 59

## 2019-10-18 ENCOUNTER — Other Ambulatory Visit: Payer: Self-pay

## 2019-10-18 ENCOUNTER — Encounter (HOSPITAL_COMMUNITY): Payer: Self-pay

## 2019-10-18 ENCOUNTER — Inpatient Hospital Stay (HOSPITAL_COMMUNITY)
Admission: EM | Admit: 2019-10-18 | Discharge: 2019-11-23 | DRG: 628 | Disposition: A | Discharge status: Rehabilitation Facility | Payer: 59 | Attending: Pediatrics | Admitting: Pediatrics

## 2019-10-18 DIAGNOSIS — R000 Tachycardia, unspecified: Secondary | ICD-10-CM | POA: Diagnosis not present

## 2019-10-18 DIAGNOSIS — D473 Essential (hemorrhagic) thrombocythemia: Secondary | ICD-10-CM

## 2019-10-18 DIAGNOSIS — S3681XA Injury of peritoneum, initial encounter: Secondary | ICD-10-CM | POA: Diagnosis present

## 2019-10-18 DIAGNOSIS — I7410 Embolism and thrombosis of unspecified parts of aorta: Secondary | ICD-10-CM | POA: Diagnosis not present

## 2019-10-18 DIAGNOSIS — I513 Intracardiac thrombosis, not elsewhere classified: Secondary | ICD-10-CM | POA: Diagnosis not present

## 2019-10-18 DIAGNOSIS — B159 Hepatitis A without hepatic coma: Secondary | ICD-10-CM | POA: Diagnosis present

## 2019-10-18 DIAGNOSIS — K760 Fatty (change of) liver, not elsewhere classified: Secondary | ICD-10-CM | POA: Diagnosis present

## 2019-10-18 DIAGNOSIS — J811 Chronic pulmonary edema: Secondary | ICD-10-CM | POA: Diagnosis present

## 2019-10-18 DIAGNOSIS — B9620 Unspecified Escherichia coli [E. coli] as the cause of diseases classified elsewhere: Secondary | ICD-10-CM | POA: Diagnosis present

## 2019-10-18 DIAGNOSIS — K660 Peritoneal adhesions (postprocedural) (postinfection): Secondary | ICD-10-CM | POA: Diagnosis present

## 2019-10-18 DIAGNOSIS — N1330 Unspecified hydronephrosis: Secondary | ICD-10-CM | POA: Diagnosis present

## 2019-10-18 DIAGNOSIS — R310 Gross hematuria: Secondary | ICD-10-CM | POA: Diagnosis not present

## 2019-10-18 DIAGNOSIS — R0600 Dyspnea, unspecified: Secondary | ICD-10-CM

## 2019-10-18 DIAGNOSIS — R700 Elevated erythrocyte sedimentation rate: Secondary | ICD-10-CM | POA: Diagnosis not present

## 2019-10-18 DIAGNOSIS — Z79899 Other long term (current) drug therapy: Secondary | ICD-10-CM

## 2019-10-18 DIAGNOSIS — N280 Ischemia and infarction of kidney: Secondary | ICD-10-CM | POA: Diagnosis present

## 2019-10-18 DIAGNOSIS — K668 Other specified disorders of peritoneum: Secondary | ICD-10-CM | POA: Diagnosis present

## 2019-10-18 DIAGNOSIS — R7982 Elevated C-reactive protein (CRP): Secondary | ICD-10-CM | POA: Diagnosis present

## 2019-10-18 DIAGNOSIS — J90 Pleural effusion, not elsewhere classified: Secondary | ICD-10-CM | POA: Diagnosis not present

## 2019-10-18 DIAGNOSIS — Z20822 Contact with and (suspected) exposure to covid-19: Secondary | ICD-10-CM | POA: Diagnosis present

## 2019-10-18 DIAGNOSIS — I9589 Other hypotension: Secondary | ICD-10-CM | POA: Diagnosis not present

## 2019-10-18 DIAGNOSIS — L0291 Cutaneous abscess, unspecified: Secondary | ICD-10-CM

## 2019-10-18 DIAGNOSIS — R54 Age-related physical debility: Secondary | ICD-10-CM | POA: Diagnosis present

## 2019-10-18 DIAGNOSIS — F4321 Adjustment disorder with depressed mood: Secondary | ICD-10-CM | POA: Diagnosis not present

## 2019-10-18 DIAGNOSIS — M7989 Other specified soft tissue disorders: Secondary | ICD-10-CM | POA: Diagnosis not present

## 2019-10-18 DIAGNOSIS — E871 Hypo-osmolality and hyponatremia: Principal | ICD-10-CM | POA: Diagnosis present

## 2019-10-18 DIAGNOSIS — K529 Noninfective gastroenteritis and colitis, unspecified: Secondary | ICD-10-CM

## 2019-10-18 DIAGNOSIS — A4151 Sepsis due to Escherichia coli [E. coli]: Secondary | ICD-10-CM | POA: Diagnosis not present

## 2019-10-18 DIAGNOSIS — K449 Diaphragmatic hernia without obstruction or gangrene: Secondary | ICD-10-CM | POA: Diagnosis present

## 2019-10-18 DIAGNOSIS — K5720 Diverticulitis of large intestine with perforation and abscess without bleeding: Secondary | ICD-10-CM | POA: Diagnosis present

## 2019-10-18 DIAGNOSIS — R571 Hypovolemic shock: Secondary | ICD-10-CM | POA: Diagnosis not present

## 2019-10-18 DIAGNOSIS — R578 Other shock: Secondary | ICD-10-CM | POA: Diagnosis not present

## 2019-10-18 DIAGNOSIS — T380X5A Adverse effect of glucocorticoids and synthetic analogues, initial encounter: Secondary | ICD-10-CM | POA: Diagnosis not present

## 2019-10-18 DIAGNOSIS — K439 Ventral hernia without obstruction or gangrene: Secondary | ICD-10-CM | POA: Diagnosis present

## 2019-10-18 DIAGNOSIS — K631 Perforation of intestine (nontraumatic): Secondary | ICD-10-CM

## 2019-10-18 DIAGNOSIS — E440 Moderate protein-calorie malnutrition: Secondary | ICD-10-CM | POA: Diagnosis present

## 2019-10-18 DIAGNOSIS — E8770 Fluid overload, unspecified: Secondary | ICD-10-CM | POA: Diagnosis not present

## 2019-10-18 DIAGNOSIS — M7981 Nontraumatic hematoma of soft tissue: Secondary | ICD-10-CM | POA: Diagnosis not present

## 2019-10-18 DIAGNOSIS — Z820 Family history of epilepsy and other diseases of the nervous system: Secondary | ICD-10-CM

## 2019-10-18 DIAGNOSIS — K9049 Malabsorption due to intolerance, not elsewhere classified: Secondary | ICD-10-CM | POA: Diagnosis present

## 2019-10-18 DIAGNOSIS — E43 Unspecified severe protein-calorie malnutrition: Secondary | ICD-10-CM | POA: Diagnosis present

## 2019-10-18 DIAGNOSIS — K5010 Crohn's disease of large intestine without complications: Secondary | ICD-10-CM

## 2019-10-18 DIAGNOSIS — J9601 Acute respiratory failure with hypoxia: Secondary | ICD-10-CM | POA: Diagnosis not present

## 2019-10-18 DIAGNOSIS — D649 Anemia, unspecified: Secondary | ICD-10-CM

## 2019-10-18 DIAGNOSIS — F05 Delirium due to known physiological condition: Secondary | ICD-10-CM | POA: Diagnosis not present

## 2019-10-18 DIAGNOSIS — I10 Essential (primary) hypertension: Secondary | ICD-10-CM | POA: Diagnosis present

## 2019-10-18 DIAGNOSIS — N736 Female pelvic peritoneal adhesions (postinfective): Secondary | ICD-10-CM | POA: Diagnosis present

## 2019-10-18 DIAGNOSIS — Z87891 Personal history of nicotine dependence: Secondary | ICD-10-CM

## 2019-10-18 DIAGNOSIS — K222 Esophageal obstruction: Secondary | ICD-10-CM | POA: Diagnosis present

## 2019-10-18 DIAGNOSIS — R0902 Hypoxemia: Secondary | ICD-10-CM

## 2019-10-18 DIAGNOSIS — R109 Unspecified abdominal pain: Secondary | ICD-10-CM

## 2019-10-18 DIAGNOSIS — B961 Klebsiella pneumoniae [K. pneumoniae] as the cause of diseases classified elsewhere: Secondary | ICD-10-CM | POA: Diagnosis present

## 2019-10-18 DIAGNOSIS — K5289 Other specified noninfective gastroenteritis and colitis: Secondary | ICD-10-CM | POA: Diagnosis present

## 2019-10-18 DIAGNOSIS — I9581 Postprocedural hypotension: Secondary | ICD-10-CM | POA: Diagnosis not present

## 2019-10-18 DIAGNOSIS — L8990 Pressure ulcer of unspecified site, unspecified stage: Secondary | ICD-10-CM | POA: Diagnosis present

## 2019-10-18 DIAGNOSIS — E8809 Other disorders of plasma-protein metabolism, not elsewhere classified: Secondary | ICD-10-CM

## 2019-10-18 DIAGNOSIS — K50118 Crohn's disease of large intestine with other complication: Secondary | ICD-10-CM | POA: Diagnosis present

## 2019-10-18 DIAGNOSIS — D539 Nutritional anemia, unspecified: Secondary | ICD-10-CM | POA: Diagnosis present

## 2019-10-18 DIAGNOSIS — Z6821 Body mass index (BMI) 21.0-21.9, adult: Secondary | ICD-10-CM

## 2019-10-18 DIAGNOSIS — D62 Acute posthemorrhagic anemia: Secondary | ICD-10-CM | POA: Diagnosis not present

## 2019-10-18 DIAGNOSIS — R634 Abnormal weight loss: Secondary | ICD-10-CM

## 2019-10-18 DIAGNOSIS — E46 Unspecified protein-calorie malnutrition: Secondary | ICD-10-CM

## 2019-10-18 DIAGNOSIS — E861 Hypovolemia: Secondary | ICD-10-CM | POA: Diagnosis present

## 2019-10-18 DIAGNOSIS — T17500A Unspecified foreign body in bronchus causing asphyxiation, initial encounter: Secondary | ICD-10-CM

## 2019-10-18 DIAGNOSIS — E860 Dehydration: Secondary | ICD-10-CM | POA: Diagnosis present

## 2019-10-18 DIAGNOSIS — S36892A Contusion of other intra-abdominal organs, initial encounter: Secondary | ICD-10-CM | POA: Diagnosis not present

## 2019-10-18 DIAGNOSIS — S301XXA Contusion of abdominal wall, initial encounter: Secondary | ICD-10-CM | POA: Diagnosis present

## 2019-10-18 DIAGNOSIS — R609 Edema, unspecified: Secondary | ICD-10-CM

## 2019-10-18 DIAGNOSIS — D6859 Other primary thrombophilia: Secondary | ICD-10-CM | POA: Diagnosis not present

## 2019-10-18 DIAGNOSIS — K651 Peritoneal abscess: Secondary | ICD-10-CM | POA: Diagnosis not present

## 2019-10-18 DIAGNOSIS — K658 Other peritonitis: Secondary | ICD-10-CM | POA: Diagnosis present

## 2019-10-18 DIAGNOSIS — R188 Other ascites: Secondary | ICD-10-CM | POA: Diagnosis not present

## 2019-10-18 DIAGNOSIS — R58 Hemorrhage, not elsewhere classified: Secondary | ICD-10-CM

## 2019-10-18 DIAGNOSIS — Z09 Encounter for follow-up examination after completed treatment for conditions other than malignant neoplasm: Secondary | ICD-10-CM

## 2019-10-18 DIAGNOSIS — D75839 Thrombocytosis, unspecified: Secondary | ICD-10-CM

## 2019-10-18 LAB — BASIC METABOLIC PANEL
Anion gap: 8 (ref 5–15)
BUN: 6 mg/dL (ref 6–20)
CO2: 29 mmol/L (ref 22–32)
Calcium: 7.3 mg/dL — ABNORMAL LOW (ref 8.9–10.3)
Chloride: 86 mmol/L — ABNORMAL LOW (ref 98–111)
Creatinine, Ser: 0.53 mg/dL (ref 0.44–1.00)
GFR calc Af Amer: >60 mL/min (ref >60)
GFR calc non Af Amer: >60 mL/min (ref >60)
Glucose, Bld: 130 mg/dL — ABNORMAL HIGH (ref 70–99)
Potassium: 3.5 mmol/L (ref 3.5–5.1)
Sodium: 123 mmol/L — ABNORMAL LOW (ref 135–145)

## 2019-10-18 LAB — CBC
HCT: 30.9 % — ABNORMAL LOW (ref 36.0–46.0)
Hemoglobin: 10.4 g/dL — ABNORMAL LOW (ref 12.0–15.0)
MCH: 34.1 pg — ABNORMAL HIGH (ref 26.0–34.0)
MCHC: 33.7 g/dL (ref 30.0–36.0)
MCV: 101.3 fL — ABNORMAL HIGH (ref 80.0–100.0)
Platelets: 593 10*3/uL — ABNORMAL HIGH (ref 150–400)
RBC: 3.05 MIL/uL — ABNORMAL LOW (ref 3.87–5.11)
RDW: 16 % — ABNORMAL HIGH (ref 11.5–15.5)
WBC: 8.6 10*3/uL (ref 4.0–10.5)
nRBC: 0.5 % — ABNORMAL HIGH (ref 0.0–0.2)

## 2019-10-18 LAB — PHOSPHORUS: Phosphorus: 1.5 mg/dL — ABNORMAL LOW (ref 2.5–4.6)

## 2019-10-18 LAB — TSH: TSH: 1.723 u[IU]/mL (ref 0.350–4.500)

## 2019-10-18 LAB — TROPONIN I (HIGH SENSITIVITY)
Troponin I (High Sensitivity): 5 ng/L (ref <18)
Troponin I (High Sensitivity): 5 ng/L (ref <18)

## 2019-10-18 LAB — MAGNESIUM: Magnesium: 1.9 mg/dL (ref 1.7–2.4)

## 2019-10-18 LAB — HEPATIC FUNCTION PANEL
ALT: 14 U/L (ref 0–44)
AST: 15 U/L (ref 15–41)
Albumin: 1 g/dL — ABNORMAL LOW (ref 3.5–5.0)
Alkaline Phosphatase: 91 U/L (ref 38–126)
Bilirubin, Direct: 0.3 mg/dL — ABNORMAL HIGH (ref 0.0–0.2)
Indirect Bilirubin: 1 mg/dL — ABNORMAL HIGH (ref 0.3–0.9)
Total Bilirubin: 1.3 mg/dL — ABNORMAL HIGH (ref 0.3–1.2)
Total Protein: 4.4 g/dL — ABNORMAL LOW (ref 6.5–8.1)

## 2019-10-18 LAB — BRAIN NATRIURETIC PEPTIDE: B Natriuretic Peptide: 75.3 pg/mL (ref 0.0–100.0)

## 2019-10-18 MED ORDER — SODIUM CHLORIDE 0.9% FLUSH
10.0000 mL | INTRAVENOUS | Status: DC | PRN
  Administered 2019-10-23: 10 mL

## 2019-10-18 MED ORDER — SODIUM CHLORIDE 0.9% FLUSH: 10.0000 mL | Freq: Two times a day (BID) | INTRAVENOUS | Status: DC

## 2019-10-18 MED ORDER — SODIUM CHLORIDE 0.9% FLUSH: 3.0000 mL | Freq: Once | INTRAVENOUS | Status: DC

## 2019-10-18 MED ORDER — IOHEXOL 350 MG/ML SOLN
75.0000 mL | Freq: Once | INTRAVENOUS | Status: AC | PRN
  Administered 2019-10-18: 75 mL via INTRAVENOUS

## 2019-10-18 NOTE — ED Triage Notes (Signed)
Pt arrives to ED w/ c/o BLE edema that started yesterday. Pt states 7/10 pain. Pt denies hx of CHF. Pt denies chest pain, sob.

## 2019-10-18 NOTE — H&P (Addendum)
Date: 10/19/2019               Patient Name:  Natasha Barrera MRN: [id number]  DOB: 1965/11/06 Age / Sex: 54 y.o., female   PCP: Lacinda Axon, MD         Medical Service: Internal Medicine Teaching Service         Attending Physician: Dr. Velna Ochs, MD    First Contact: Dr. Virl Axe Pager: [phone]  Second Contact: Dr. Lonia Skinner Pager: [phone]       After Hours (After 5p/  First Contact Pager: [phone]  weekends / holidays): Second Contact Pager: [phone]   Chief Complaint: Leg swelling  History of Present Illness:  Natasha Barrera is a 54 year old female with past medical history of HTN, diverticulitis s/p sigmoid colectomy in 2013, ventral hernia (repaired), and prior episode of colitis in her teens who presented to [facility] for evaluation of leg swelling.  Patient was admitted to IMT[facility] from 10/02/19 to 10/04/19 for complaints of abdominal discomfort, diarrhea, and weight loss found to be hypotensive and with electrolyte derangements. CT abdomen/pelvis at the time revealed diffuse colitis involving the ascending, transverse and descending colon. GI was consulted and recommended inpatient colonoscopy and stool studies. Patient and her husband refused to stay for complete workup and left AMA. Upon discharge, she was hypotensive despite IVFs with hypokalemia, hyponatremia, and severe hypocalcemia. Following discharge from the hospital, her stool studies were remarkable for a Calprotectin of 17,356 with a negative GIP panel, TTG and IgA.  Since her discharge on 07/04, she states that she has continued to experience worsening fatigue, poor appetite, and loose stool. She has spent the majority of her time in bed due to fatigue. For her diet, she has been consuming less than 1,000 calories per day per her husband's report and only eating small amounts of food. She is not experiencing nausea or vomiting with eating, however she reports  having no appetite. She has experienced approximately a thirteen pound weight loss over the past month. She reports diffuse abdominal tenderness and soreness, as if she "did some crunches." Her bowel movements vary from very limited to approximately twice daily. The bowel movements tend to be light brown, loose and occasionally greasy. She endorses frequent flatulence. She has been taking over the counter Imodium A-D and Mylanta as needed to control her symptoms.   Her only new symptom since leaving AMA is the new-onset of swelling in her legs. She reports that yesterday she began to notice right foot swelling which progressed up her ankle throughout the day. Later, the left foot became swollen and began progressing up her leg. She denies pain in her lower extremities and she is able to walk without complication. Her husband was most concerned for a clot in her legs so they came to the ED for evaluation.  On arrival to the ED, she was afebrile, blood pressure of 97/65, tachycardic and satting well on room air. CBC remarkable for Hgb 10.4, PLT 593; BMP Na 123, Cl 86, K 3.5, Ca 7.3; Phos 1.5; HFP revealed albumin 1.0, total protein 4.4, TBili 1.3, Indirect 1.0. CTA unremarkable. She was admitted to Internal Medicine Teaching Service for further workup and treatment.  Meds:  Current Meds  Medication Sig  . Calcium Carb-Cholecalciferol (CALCIUM+D3 PO) Take 1 tablet by mouth daily.  . Cholecalciferol (VITAMIN D-3) 125 MCG (5000 UT) TABS Take 5,000 Units by mouth daily.  Marland Kitchen loperamide (IMODIUM A-D) 2  MG tablet Take 1 tablet (2 mg total) by mouth 4 (four) times daily as needed for diarrhea or loose stools.  . Multiple Vitamin (MULTIVITAMIN WITH MINERALS) TABS tablet Take 1 tablet by mouth daily. Centrum Silver   Allergies: Allergies as of 10/18/2019  . (No Known Allergies)   Past Medical History:  Diagnosis Date  . Diverticul disease small and large intestine, no perforati or abscess   . Diverticulitis  of colon   . Hypertension     Family History: -Father: deceased, brain tumor -Mother: deceased, multiple sclerosis  Social History: -Lives with her husband and one of her sons -Smoking history of 6-8 cigarettes/day for about 25-30 years; stopped smoking about 5 years ago. -No history of alcohol or illicit drug use -No history of recent travel.  Review of Systems: A complete ROS was negative except as per HPI.   Physical Exam: Blood pressure (!) 81/52, pulse 86, temperature 99.2 F (37.3 C), temperature source Oral, resp. rate 16, height 5' 4"  (1.626 m), weight 53.5 kg, last menstrual period 12/15/2014, SpO2 97 %. Physical Exam HENT:     Head: Normocephalic and atraumatic.     Nose: Nose normal.  Eyes:     Pupils: Pupils are equal, round, and reactive to light.  Cardiovascular:     Rate and Rhythm: Regular rhythm. Tachycardia present.     Pulses: Normal pulses.     Heart sounds: Normal heart sounds.  Pulmonary:     Effort: Pulmonary effort is normal.     Breath sounds: Normal breath sounds.  Abdominal:     General: Abdomen is flat. Bowel sounds are normal.     Palpations: Abdomen is soft.     Tenderness: There is abdominal tenderness.     Comments: Tender to deep palpation along upper abdomen.  Musculoskeletal:     Cervical back: Normal range of motion and neck supple.     Right lower leg: Edema present.     Left lower leg: Edema present.     Comments: RLE and LLE 2+ pitting edema to the knees RUE and LUE 1+ pitting edema.  Skin:    Coloration: Skin is pale.  Neurological:     General: No focal deficit present.     Mental Status: Mental status is at baseline.  Psychiatric:        Mood and Affect: Mood normal.        Behavior: Behavior normal.        Thought Content: Thought content normal.        Judgment: Judgment normal.    EKG: Sinus tachycardia CT ANGIO CHEST PE W OR WO CONTRAST: No evidence to suggest PE. No convincing features of edema.   Assessment &  Plan by Problem: [name] is a 54 year old female with past medical history of HTN, diverticulitis s/p sigmoid colectomy in 2013, ventral hernia (repaired), and prior episode of colitis, and a recent hospitalization for diarrhea, hypotension and electrolyte derangements secondary to colitis who returned nine days after leaving AMA found to have continued electrolyte derangements with new onset peripheral edema. Active Problems:   Hyponatremia  #Colitis At her prior hospitalization, patient received workup of gastrointestinal symptoms of diarrhea and flatulence. CT Abdomen/Pelvis revealed diffuse colitis. Stool studies were performed which revealed a very high calprotectin, negative GIP panel and negative TTG and IgA. This pattern represents an inflammatory etiology of her colitis rather than infectious. Although, GI desired to perform a colonoscopy at prior admission, patient left AMA. Therefore,  she would strongly benefit from a colonoscopy during this admission, and she is interested in undergoing this procedure while inpatient. Possible causes of her inflammatory colitis include autoimmune etiologies such as ulcerative colitis or Crohn's, as well as an underlying malignancy. PLAN: -GI consult -Repeat CRP -Repeat ESR -Holding home over-the-counter Imodium A-D and Mylanta  #Hypoalbuminemia #Peripheral edema Patient's albumin of 1.0 at this admission. Her hypoalbuminemia is likely the cause of her worsening peripheral edema. This severe hypoalbuminemia may be secondary to a protein losing gastroenteropathy in the setting of her inflammatory colitis. However, alternative diagnoses such as an underlying nephrotic syndrome may be considered. PLAN: -UA  #Hyponatremia #Hypoosmolar hyponatremic Patient's calculated serum osmolarity of 255. Currently asymptomatic, however she requires IVF as she is currently hypotensive. PLAN: -CMP  #Hypophosphatemia Phosphorous of  1.5. PLAN: -Measure and replete phosphorous as needed  #Macrocytic anemia Folate normal, B12 elevated. Unknown etiology of this condition.  PLAN: -Continue to monitor  #Thrombocytosis PLT 593. Likely reactive in the setting of inflammatory colitis. PLAN: -CBC  #Hypocalcemia Her hypocalcemia corrects with albumin to 9.7 PLAN: -Continue to monitor  Code status: Full Diet: Regular diet, dietitian consult placed  DVT ppx: Lovenox 27m subq  Dispo: Admit patient to Inpatient with expected length of stay greater than 2 midnights.  Signed: JPaulla Dolly MD 10/19/2019, 1:35 AM  Pager: [phone] 5pm on weekdays and 1pm on weekends: On Call pager: [phone]

## 2019-10-18 NOTE — ED Provider Notes (Signed)
[facility] EMERGENCY DEPARTMENT Provider Note   CSN: [id number] Arrival date & time: 10/18/19  1608     History Chief Complaint  Patient presents with  . Foot Swelling    Natasha Barrera is a 54 y.o. female.  HPI Patient has had increasing fatigue and shortness of breath with exertion.  She has had weight loss over the past month.  Patient reports that she was diagnosed with colitis at the beginning of this month.  She reports she was having diarrheal stool although is not having that frequently now.  She has not had vomiting but has had significant loss of appetite and weight loss.  She and her husband became more concerned when she developed large swelling of both legs over the past couple of days.  She denies any pain associated.  No fevers or chills.  No smoking history.    Past Medical History:  Diagnosis Date  . Diverticul disease small and large intestine, no perforati or abscess   . Diverticulitis of colon   . Hypertension     Patient Active Problem List   Diagnosis Date Noted  . Colitis 10/03/2019  . Diarrhea   . Loss of appetite 10/02/2019  . Abdominal bloating 10/02/2019  . Hyponatremia 10/02/2019  . Tachycardia 10/02/2019  . Loss of weight 10/02/2019  . Hypokalemia 09/19/2019  . Diverticulitis of colon 12/29/2011    Past Surgical History:  Procedure Laterality Date  . COLON SURGERY  12/25/2011  . COLONOSCOPY    . [place]  2002  . VENTRAL HERNIA REPAIR N/A 12/05/2018   Procedure: LAPAROSCOPIC VENTRAL HERNIA REPAIR WITH MESH;  Surgeon: Jovita Kussmaul, MD;  Location: [facility];  Service: General;  Laterality: N/A;     OB History   No obstetric history on file.     Family History  Problem Relation Age of Onset  . Breast cancer Neg Hx     Social History   Tobacco Use  . Smoking status: Former Research scientist (life sciences)  . Smokeless tobacco: Never Used  Vaping Use  . Vaping Use: Never used  Substance Use Topics  . Alcohol use: Yes     Comment: occasional  . Drug use: No    Home Medications Prior to Admission medications   Medication Sig Start Date End Date Taking? Authorizing Provider  Calcium Carb-Cholecalciferol (CALCIUM+D3 PO) Take 1 tablet by mouth daily.   Yes [provider]  Cholecalciferol (VITAMIN D-3) 125 MCG (5000 UT) TABS Take 5,000 Units by mouth daily.   Yes [provider]  loperamide (IMODIUM A-D) 2 MG tablet Take 1 tablet (2 mg total) by mouth 4 (four) times daily as needed for diarrhea or loose stools. 09/20/19  Yes Maudie Mercury, MD  Multiple Vitamin (MULTIVITAMIN WITH MINERALS) TABS tablet Take 1 tablet by mouth daily. Centrum Silver   Yes [provider]  potassium chloride (KLOR-CON) 10 MEQ tablet Take 2 tablets (20 mEq total) by mouth daily. Patient not taking: Reported on 10/18/2019 09/20/19   Maudie Mercury, MD    Allergies    Patient has no known allergies.  Review of Systems   Review of Systems 10 systems reviewed and negative except as per HPI Physical Exam Updated Vital Signs BP 98/62 (BP Location: Right Arm)   Pulse (!) 103   Temp 99.2 F (37.3 C) (Oral)   Resp 16   Ht 5' 4"  (1.626 m)   Wt 53.5 kg   LMP 12/15/2014   SpO2 95%   BMI  20.25 kg/m   Physical Exam Constitutional:      Comments: Patient is thin, face appearing gaunt.  No respiratory distress.  Mental status clear.  HENT:     Head: Normocephalic and atraumatic.     Nose: Nose normal.     Mouth/Throat:     Comments: Tongue has mild amount of [place] serpiginous plaque.  Posterior airway widely patent.  Dentition in good condition. Eyes:     Extraocular Movements: Extraocular movements intact.     Comments: Eyes appear slightly sunken.  Neck:     Comments: No lymphadenopathy or JVD. Cardiovascular:     Comments: Tachycardia.  No gross rub murmur gallop. Pulmonary:     Comments: No respiratory distress.  Lung sounds to the bases are very soft.  Occasional expiratory wheeze. Abdominal:       Comments: Abdomen mildly distended.  Soft without guarding.  Musculoskeletal:     Cervical back: Neck supple.     Comments: 2-3+ pitting edema symmetric bilateral lower legs and feet.  Skin:    General: Skin is warm and dry.     Coloration: Skin is pale.  Neurological:     General: No focal deficit present.     Mental Status: She is oriented to person, place, and time.     Coordination: Coordination normal.  Psychiatric:        Mood and Affect: Mood normal.     ED Results / Procedures / Treatments   Labs (all labs ordered are listed, but only abnormal results are displayed) Labs Reviewed  BASIC METABOLIC PANEL - Abnormal; Notable for the following components:      Result Value   Sodium 123 (*)    Chloride 86 (*)    Glucose, Bld 130 (*)    Calcium 7.3 (*)    All other components within normal limits  CBC - Abnormal; Notable for the following components:   RBC 3.05 (*)    Hemoglobin 10.4 (*)    HCT 30.9 (*)    MCV 101.3 (*)    MCH 34.1 (*)    RDW 16.0 (*)    Platelets 593 (*)    nRBC 0.5 (*)    All other components within normal limits  PHOSPHORUS - Abnormal; Notable for the following components:   Phosphorus 1.5 (*)    All other components within normal limits  HEPATIC FUNCTION PANEL - Abnormal; Notable for the following components:   Total Protein 4.4 (*)    Albumin 1.0 (*)    Total Bilirubin 1.3 (*)    Bilirubin, Direct 0.3 (*)    Indirect Bilirubin 1.0 (*)    All other components within normal limits  SARS CORONAVIRUS 2 BY RT PCR (HOSPITAL ORDER, [facility])  TSH  MAGNESIUM  BRAIN NATRIURETIC PEPTIDE  TROPONIN I (HIGH SENSITIVITY)  TROPONIN I (HIGH SENSITIVITY)    EKG EKG Interpretation  Date/Time:  Sunday October 18 2019 16:43:53 EDT Ventricular Rate:  129 PR Interval:  120 QRS Duration: 80 QT Interval:  306 QTC Calculation: 448 R Axis:   61 Text Interpretation: Sinus tachycardia Cannot rule out Anterior infarct , age  undetermined T wave abnormality, consider inferolateral ischemia Abnormal ECG rate increase compared toprevious. t wave inversion likely rate related Confirmed by Charlesetta Shanks [phone]) on 10/18/2019 6:55:08 PM   Radiology CT Angio Chest PE W/Cm &/Or Wo Cm  Result Date: 10/18/2019 CLINICAL DATA:  Shortness of breath, bilateral lower extremity edema EXAM: CT ANGIOGRAPHY CHEST WITH  CONTRAST TECHNIQUE: Multidetector CT imaging of the chest was performed using the standard protocol during bolus administration of intravenous contrast. Multiplanar CT image reconstructions and MIPs were obtained to evaluate the vascular anatomy. CONTRAST:  52m OMNIPAQUE IOHEXOL 350 MG/ML SOLN COMPARISON:  CT abdomen pelvis 10/02/2019 FINDINGS: Cardiovascular: Satisfactory opacification the pulmonary arteries to the segmental level. No pulmonary artery filling defects are identified. Central pulmonary arteries are normal caliber. Normal heart size. Trace pericardial fluid. Coronary artery calcifications are present. The aorta is normal caliber. No acute luminal abnormality nor periaortic stranding or hemorrhage. Shared origin of the brachiocephalic and left common carotid artery. Minimal plaque in the proximal great vessel origins without other acute abnormality. Mediastinum/Nodes: No mediastinal fluid or gas. Normal thyroid gland and thoracic inlet. No acute abnormality of the trachea. Small hiatal hernia, esophagus otherwise unremarkable. No worrisome mediastinal, hilar or axillary adenopathy. Lungs/Pleura: Mild centrilobular predominant emphysematous changes in lungs. Diffuse airways thickening and scattered secretions. No consolidation, features of edema, pneumothorax, or effusion. No suspicious pulmonary nodules or masses. Tiny fat containing right Bochdalek's hernia. Upper Abdomen: No acute abnormalities present in the visualized portions of the upper abdomen. Musculoskeletal: No chest wall mass or suspicious bone lesions  identified. Minimal degenerative changes in the spine Review of the MIP images confirms the above findings. IMPRESSION: 1. No evidence of acute pulmonary artery filling defects to suggest pulmonary embolism. 2. No convincing features of edema. 3. Emphysema (ICD10-J43.9). Additional bronchitic changes could be acute or chronic, appearing similar to comparison abdominopelvic CT. Constellation of findings could reflect smoking related changes. Correlate with patient history. 4. Coronary artery atherosclerosis. 5. Aortic Atherosclerosis (ICD10-I70.0) Electronically Signed   By: PLovena LeM.D.   On: 10/18/2019 21:56    Procedures Procedures (including critical care time)  Medications Ordered in ED Medications  sodium chloride flush (NS) 0.9 % injection 3 mL (3 mLs Intravenous Not Given 10/18/19 2208)  sodium chloride flush (NS) 0.9 % injection 10-40 mL (10 mLs Intracatheter Not Given 10/18/19 2208)  sodium chloride flush (NS) 0.9 % injection 10-40 mL (has no administration in time range)  iohexol (OMNIPAQUE) 350 MG/ML injection 75 mL (75 mLs Intravenous Contrast Given 10/18/19 2129)    ED Course  I have reviewed the triage vital signs and the nursing notes.  Pertinent labs & imaging results that were available during my care of the patient were reviewed by me and considered in my medical decision making (see chart for details).    MDM Rules/Calculators/A&P                         Consult: Internal medicine teaching service for admission Presents with significant weight loss over a short period of time, hyponatremia and peripheral edema.  Patient has chronically ill appearance.  She was diagnosed with a nonspecific colitis earlier this month.  Etiology of symptoms unclear although concern for underlying malignancy.  Patient has significant hyponatremia.  Will plan for admission for further diagnostic evaluation and electrolyte correction. Final Clinical Impression(s) / ED Diagnoses Final  diagnoses:  Hyponatremia  Weight loss, unintentional  Peripheral edema  Thrombocytosis (HCC)  Anemia, unspecified type    Rx / DC Orders ED Discharge Orders    None       PCharlesetta Shanks MD 10/18/19 2329

## 2019-10-19 ENCOUNTER — Inpatient Hospital Stay (HOSPITAL_COMMUNITY): Payer: 59

## 2019-10-19 DIAGNOSIS — R609 Edema, unspecified: Secondary | ICD-10-CM | POA: Diagnosis not present

## 2019-10-19 DIAGNOSIS — K50918 Crohn's disease, unspecified, with other complication: Secondary | ICD-10-CM | POA: Diagnosis not present

## 2019-10-19 DIAGNOSIS — R935 Abnormal findings on diagnostic imaging of other abdominal regions, including retroperitoneum: Secondary | ICD-10-CM

## 2019-10-19 DIAGNOSIS — I9589 Other hypotension: Secondary | ICD-10-CM

## 2019-10-19 DIAGNOSIS — D62 Acute posthemorrhagic anemia: Secondary | ICD-10-CM | POA: Diagnosis not present

## 2019-10-19 DIAGNOSIS — E46 Unspecified protein-calorie malnutrition: Secondary | ICD-10-CM | POA: Diagnosis not present

## 2019-10-19 DIAGNOSIS — K222 Esophageal obstruction: Secondary | ICD-10-CM | POA: Diagnosis not present

## 2019-10-19 DIAGNOSIS — R0902 Hypoxemia: Secondary | ICD-10-CM | POA: Diagnosis not present

## 2019-10-19 DIAGNOSIS — E8809 Other disorders of plasma-protein metabolism, not elsewhere classified: Secondary | ICD-10-CM | POA: Diagnosis not present

## 2019-10-19 DIAGNOSIS — E43 Unspecified severe protein-calorie malnutrition: Secondary | ICD-10-CM | POA: Diagnosis present

## 2019-10-19 DIAGNOSIS — D500 Iron deficiency anemia secondary to blood loss (chronic): Secondary | ICD-10-CM | POA: Diagnosis not present

## 2019-10-19 DIAGNOSIS — R188 Other ascites: Secondary | ICD-10-CM | POA: Diagnosis not present

## 2019-10-19 DIAGNOSIS — D473 Essential (hemorrhagic) thrombocythemia: Secondary | ICD-10-CM | POA: Diagnosis not present

## 2019-10-19 DIAGNOSIS — K5720 Diverticulitis of large intestine with perforation and abscess without bleeding: Secondary | ICD-10-CM | POA: Diagnosis present

## 2019-10-19 DIAGNOSIS — I313 Pericardial effusion (noninflammatory): Secondary | ICD-10-CM | POA: Diagnosis not present

## 2019-10-19 DIAGNOSIS — N280 Ischemia and infarction of kidney: Secondary | ICD-10-CM | POA: Diagnosis present

## 2019-10-19 DIAGNOSIS — I10 Essential (primary) hypertension: Secondary | ICD-10-CM | POA: Diagnosis not present

## 2019-10-19 DIAGNOSIS — D638 Anemia in other chronic diseases classified elsewhere: Secondary | ICD-10-CM | POA: Diagnosis not present

## 2019-10-19 DIAGNOSIS — E871 Hypo-osmolality and hyponatremia: Secondary | ICD-10-CM | POA: Diagnosis present

## 2019-10-19 DIAGNOSIS — K50118 Crohn's disease of large intestine with other complication: Secondary | ICD-10-CM | POA: Diagnosis present

## 2019-10-19 DIAGNOSIS — N1330 Unspecified hydronephrosis: Secondary | ICD-10-CM | POA: Diagnosis present

## 2019-10-19 DIAGNOSIS — R571 Hypovolemic shock: Secondary | ICD-10-CM | POA: Diagnosis not present

## 2019-10-19 DIAGNOSIS — D6859 Other primary thrombophilia: Secondary | ICD-10-CM | POA: Diagnosis not present

## 2019-10-19 DIAGNOSIS — I959 Hypotension, unspecified: Secondary | ICD-10-CM | POA: Diagnosis not present

## 2019-10-19 DIAGNOSIS — J90 Pleural effusion, not elsewhere classified: Secondary | ICD-10-CM | POA: Diagnosis not present

## 2019-10-19 DIAGNOSIS — K9049 Malabsorption due to intolerance, not elsewhere classified: Secondary | ICD-10-CM | POA: Diagnosis present

## 2019-10-19 DIAGNOSIS — B9620 Unspecified Escherichia coli [E. coli] as the cause of diseases classified elsewhere: Secondary | ICD-10-CM | POA: Diagnosis not present

## 2019-10-19 DIAGNOSIS — E440 Moderate protein-calorie malnutrition: Secondary | ICD-10-CM | POA: Diagnosis not present

## 2019-10-19 DIAGNOSIS — K658 Other peritonitis: Secondary | ICD-10-CM | POA: Diagnosis present

## 2019-10-19 DIAGNOSIS — J9601 Acute respiratory failure with hypoxia: Secondary | ICD-10-CM | POA: Diagnosis not present

## 2019-10-19 DIAGNOSIS — D649 Anemia, unspecified: Secondary | ICD-10-CM | POA: Diagnosis not present

## 2019-10-19 DIAGNOSIS — I7410 Embolism and thrombosis of unspecified parts of aorta: Secondary | ICD-10-CM | POA: Diagnosis not present

## 2019-10-19 DIAGNOSIS — R5381 Other malaise: Secondary | ICD-10-CM | POA: Diagnosis not present

## 2019-10-19 DIAGNOSIS — K651 Peritoneal abscess: Secondary | ICD-10-CM | POA: Diagnosis not present

## 2019-10-19 DIAGNOSIS — I7419 Embolism and thrombosis of other parts of aorta: Secondary | ICD-10-CM | POA: Diagnosis not present

## 2019-10-19 DIAGNOSIS — R634 Abnormal weight loss: Secondary | ICD-10-CM | POA: Diagnosis not present

## 2019-10-19 DIAGNOSIS — E861 Hypovolemia: Secondary | ICD-10-CM | POA: Diagnosis present

## 2019-10-19 DIAGNOSIS — M7989 Other specified soft tissue disorders: Secondary | ICD-10-CM | POA: Diagnosis present

## 2019-10-19 DIAGNOSIS — R600 Localized edema: Secondary | ICD-10-CM | POA: Diagnosis not present

## 2019-10-19 DIAGNOSIS — Z20822 Contact with and (suspected) exposure to covid-19: Secondary | ICD-10-CM | POA: Diagnosis present

## 2019-10-19 DIAGNOSIS — E876 Hypokalemia: Secondary | ICD-10-CM | POA: Diagnosis not present

## 2019-10-19 DIAGNOSIS — K668 Other specified disorders of peritoneum: Secondary | ICD-10-CM | POA: Diagnosis not present

## 2019-10-19 DIAGNOSIS — K631 Perforation of intestine (nontraumatic): Secondary | ICD-10-CM | POA: Diagnosis not present

## 2019-10-19 DIAGNOSIS — B159 Hepatitis A without hepatic coma: Secondary | ICD-10-CM | POA: Diagnosis present

## 2019-10-19 DIAGNOSIS — R578 Other shock: Secondary | ICD-10-CM | POA: Diagnosis not present

## 2019-10-19 DIAGNOSIS — Z9049 Acquired absence of other specified parts of digestive tract: Secondary | ICD-10-CM | POA: Diagnosis not present

## 2019-10-19 DIAGNOSIS — R7881 Bacteremia: Secondary | ICD-10-CM | POA: Diagnosis not present

## 2019-10-19 DIAGNOSIS — J811 Chronic pulmonary edema: Secondary | ICD-10-CM | POA: Diagnosis present

## 2019-10-19 DIAGNOSIS — R0602 Shortness of breath: Secondary | ICD-10-CM | POA: Diagnosis not present

## 2019-10-19 DIAGNOSIS — A4151 Sepsis due to Escherichia coli [E. coli]: Secondary | ICD-10-CM | POA: Diagnosis not present

## 2019-10-19 DIAGNOSIS — K6389 Other specified diseases of intestine: Secondary | ICD-10-CM | POA: Diagnosis not present

## 2019-10-19 DIAGNOSIS — K50814 Crohn's disease of both small and large intestine with abscess: Secondary | ICD-10-CM | POA: Diagnosis not present

## 2019-10-19 DIAGNOSIS — S36892A Contusion of other intra-abdominal organs, initial encounter: Secondary | ICD-10-CM | POA: Diagnosis not present

## 2019-10-19 DIAGNOSIS — R579 Shock, unspecified: Secondary | ICD-10-CM | POA: Diagnosis not present

## 2019-10-19 DIAGNOSIS — F05 Delirium due to known physiological condition: Secondary | ICD-10-CM | POA: Diagnosis not present

## 2019-10-19 DIAGNOSIS — K529 Noninfective gastroenteritis and colitis, unspecified: Secondary | ICD-10-CM | POA: Diagnosis not present

## 2019-10-19 LAB — BASIC METABOLIC PANEL
Anion gap: 8 (ref 5–15)
Anion gap: 8 (ref 5–15)
Anion gap: 7 (ref 5–15)
Anion gap: 6 (ref 5–15)
BUN: 5 mg/dL — ABNORMAL LOW (ref 6–20)
BUN: <5 mg/dL — ABNORMAL LOW (ref 6–20)
BUN: 5 mg/dL — ABNORMAL LOW (ref 6–20)
BUN: <5 mg/dL — ABNORMAL LOW (ref 6–20)
CO2: 26 mmol/L (ref 22–32)
CO2: 26 mmol/L (ref 22–32)
CO2: 28 mmol/L (ref 22–32)
CO2: 26 mmol/L (ref 22–32)
Calcium: 6.6 mg/dL — ABNORMAL LOW (ref 8.9–10.3)
Calcium: 6.7 mg/dL — ABNORMAL LOW (ref 8.9–10.3)
Calcium: 6.7 mg/dL — ABNORMAL LOW (ref 8.9–10.3)
Calcium: 6.6 mg/dL — ABNORMAL LOW (ref 8.9–10.3)
Chloride: 92 mmol/L — ABNORMAL LOW (ref 98–111)
Chloride: 94 mmol/L — ABNORMAL LOW (ref 98–111)
Chloride: 94 mmol/L — ABNORMAL LOW (ref 98–111)
Chloride: 97 mmol/L — ABNORMAL LOW (ref 98–111)
Creatinine, Ser: 0.4 mg/dL — ABNORMAL LOW (ref 0.44–1.00)
Creatinine, Ser: 0.34 mg/dL — ABNORMAL LOW (ref 0.44–1.00)
Creatinine, Ser: 0.41 mg/dL — ABNORMAL LOW (ref 0.44–1.00)
Creatinine, Ser: 0.43 mg/dL — ABNORMAL LOW (ref 0.44–1.00)
GFR calc Af Amer: >60 mL/min (ref >60)
GFR calc Af Amer: >60 mL/min (ref >60)
GFR calc Af Amer: >60 mL/min (ref >60)
GFR calc Af Amer: >60 mL/min (ref >60)
GFR calc non Af Amer: >60 mL/min (ref >60)
GFR calc non Af Amer: >60 mL/min (ref >60)
GFR calc non Af Amer: >60 mL/min (ref >60)
GFR calc non Af Amer: >60 mL/min (ref >60)
Glucose, Bld: 126 mg/dL — ABNORMAL HIGH (ref 70–99)
Glucose, Bld: 81 mg/dL (ref 70–99)
Glucose, Bld: 97 mg/dL (ref 70–99)
Glucose, Bld: 101 mg/dL — ABNORMAL HIGH (ref 70–99)
Potassium: 3.9 mmol/L (ref 3.5–5.1)
Potassium: 3.7 mmol/L (ref 3.5–5.1)
Potassium: 3.6 mmol/L (ref 3.5–5.1)
Potassium: 3.3 mmol/L — ABNORMAL LOW (ref 3.5–5.1)
Sodium: 126 mmol/L — ABNORMAL LOW (ref 135–145)
Sodium: 128 mmol/L — ABNORMAL LOW (ref 135–145)
Sodium: 129 mmol/L — ABNORMAL LOW (ref 135–145)
Sodium: 129 mmol/L — ABNORMAL LOW (ref 135–145)

## 2019-10-19 LAB — COMPREHENSIVE METABOLIC PANEL
ALT: 11 U/L (ref 0–44)
AST: 13 U/L — ABNORMAL LOW (ref 15–41)
Albumin: <1 g/dL — ABNORMAL LOW (ref 3.5–5.0)
Alkaline Phosphatase: 81 U/L (ref 38–126)
Anion gap: 5 (ref 5–15)
BUN: 6 mg/dL (ref 6–20)
CO2: 30 mmol/L (ref 22–32)
Calcium: 6.6 mg/dL — ABNORMAL LOW (ref 8.9–10.3)
Chloride: 90 mmol/L — ABNORMAL LOW (ref 98–111)
Creatinine, Ser: 0.4 mg/dL — ABNORMAL LOW (ref 0.44–1.00)
GFR calc Af Amer: >60 mL/min (ref >60)
GFR calc non Af Amer: >60 mL/min (ref >60)
Glucose, Bld: 101 mg/dL — ABNORMAL HIGH (ref 70–99)
Potassium: 3.4 mmol/L — ABNORMAL LOW (ref 3.5–5.1)
Sodium: 125 mmol/L — ABNORMAL LOW (ref 135–145)
Total Bilirubin: 1.4 mg/dL — ABNORMAL HIGH (ref 0.3–1.2)
Total Protein: 3.7 g/dL — ABNORMAL LOW (ref 6.5–8.1)

## 2019-10-19 LAB — URINALYSIS, ROUTINE W REFLEX MICROSCOPIC
Bilirubin Urine: NEGATIVE
Glucose, UA: NEGATIVE mg/dL
Hgb urine dipstick: NEGATIVE
Ketones, ur: NEGATIVE mg/dL
Nitrite: POSITIVE — AB
Protein, ur: NEGATIVE mg/dL
Specific Gravity, Urine: 1.034 — ABNORMAL HIGH (ref 1.005–1.030)
pH: 8 (ref 5.0–8.0)

## 2019-10-19 LAB — CBC
HCT: 24.4 % — ABNORMAL LOW (ref 36.0–46.0)
Hemoglobin: 7.9 g/dL — ABNORMAL LOW (ref 12.0–15.0)
MCH: 33.3 pg (ref 26.0–34.0)
MCHC: 32.4 g/dL (ref 30.0–36.0)
MCV: 103 fL — ABNORMAL HIGH (ref 80.0–100.0)
Platelets: 395 10*3/uL (ref 150–400)
RBC: 2.37 MIL/uL — ABNORMAL LOW (ref 3.87–5.11)
RDW: 16.2 % — ABNORMAL HIGH (ref 11.5–15.5)
WBC: 5.1 10*3/uL (ref 4.0–10.5)
nRBC: 0.4 % — ABNORMAL HIGH (ref 0.0–0.2)

## 2019-10-19 LAB — SEDIMENTATION RATE: Sed Rate: 23 mm/hr — ABNORMAL HIGH (ref 0–22)

## 2019-10-19 LAB — OSMOLALITY, URINE: Osmolality, Ur: 261 mOsm/kg — ABNORMAL LOW (ref 300–900)

## 2019-10-19 LAB — PHOSPHORUS: Phosphorus: 1.9 mg/dL — ABNORMAL LOW (ref 2.5–4.6)

## 2019-10-19 LAB — SODIUM, URINE, RANDOM: Sodium, Ur: <10 mmol/L

## 2019-10-19 LAB — SARS CORONAVIRUS 2 BY RT PCR (HOSPITAL ORDER, PERFORMED IN ~~LOC~~ HOSPITAL LAB): SARS Coronavirus 2: NEGATIVE

## 2019-10-19 LAB — C-REACTIVE PROTEIN: CRP: 9 mg/dL — ABNORMAL HIGH (ref <1.0)

## 2019-10-19 MED ORDER — SODIUM CHLORIDE 0.9 % IV BOLUS
500.0000 mL | Freq: Once | INTRAVENOUS | Status: AC
  Administered 2019-10-19: 500 mL via INTRAVENOUS

## 2019-10-19 MED ORDER — LACTATED RINGERS IV BOLUS
1000.0000 mL | Freq: Once | INTRAVENOUS | Status: AC
  Administered 2019-10-19: 1000 mL via INTRAVENOUS

## 2019-10-19 MED ORDER — ONDANSETRON HCL 4 MG/2ML IJ SOLN
4.0000 mg | Freq: Four times a day (QID) | INTRAMUSCULAR | Status: DC | PRN
  Administered 2019-10-23 – 2019-11-05 (×2): 4 mg via INTRAVENOUS
  Filled 2019-10-19 (×3): qty 2

## 2019-10-19 MED ORDER — MIDODRINE HCL 5 MG PO TABS
5.0000 mg | ORAL_TABLET | Freq: Three times a day (TID) | ORAL | Status: DC
  Administered 2019-10-19 – 2019-10-20 (×4): 5 mg via ORAL
  Filled 2019-10-19 (×4): qty 1

## 2019-10-19 MED ORDER — ENOXAPARIN SODIUM 40 MG/0.4ML ~~LOC~~ SOLN
40.0000 mg | SUBCUTANEOUS | Status: DC
  Administered 2019-10-19 – 2019-10-20 (×2): 40 mg via SUBCUTANEOUS
  Filled 2019-10-19 (×2): qty 0.4

## 2019-10-19 MED ORDER — MIDODRINE HCL 5 MG PO TABS: 5.0000 mg | ORAL_TABLET | Freq: Three times a day (TID) | ORAL | Status: DC

## 2019-10-19 MED ORDER — ACETAMINOPHEN 325 MG PO TABS
650.0000 mg | ORAL_TABLET | Freq: Four times a day (QID) | ORAL | Status: DC | PRN
  Administered 2019-10-22 – 2019-10-23 (×2): 650 mg via ORAL
  Filled 2019-10-19 (×2): qty 2

## 2019-10-19 MED ORDER — ACETAMINOPHEN 650 MG RE SUPP: 650.0000 mg | Freq: Four times a day (QID) | RECTAL | Status: DC | PRN

## 2019-10-19 MED ORDER — BOOST / RESOURCE BREEZE PO LIQD CUSTOM
1.0000 | Freq: Three times a day (TID) | ORAL | Status: DC
  Administered 2019-10-19 – 2019-10-20 (×2): 1 via ORAL
  Filled 2019-10-19 (×2): qty 1

## 2019-10-19 MED ORDER — ADULT MULTIVITAMIN W/MINERALS CH
1.0000 | ORAL_TABLET | Freq: Every day | ORAL | Status: DC
  Administered 2019-10-19 – 2019-10-23 (×4): 1 via ORAL
  Filled 2019-10-19 (×4): qty 1

## 2019-10-19 MED ORDER — LACTATED RINGERS IV SOLN: INTRAVENOUS | Status: DC

## 2019-10-19 MED ORDER — POTASSIUM PHOSPHATES 15 MMOLE/5ML IV SOLN
40.0000 mmol | Freq: Once | INTRAVENOUS | Status: AC
  Administered 2019-10-19: 40 mmol via INTRAVENOUS
  Filled 2019-10-19: qty 13.33

## 2019-10-19 MED ORDER — SODIUM CHLORIDE 0.9% FLUSH
3.0000 mL | Freq: Two times a day (BID) | INTRAVENOUS | Status: DC
  Administered 2019-10-19 – 2019-10-26 (×9): 3 mL via INTRAVENOUS

## 2019-10-19 NOTE — Progress Notes (Signed)
Bilateral lower extremity venous duplex has been completed. Preliminary results can be found in CV Proc through chart review.   10/19/19 12:51 PM Carlos Levering RVT

## 2019-10-19 NOTE — ED Notes (Addendum)
Contacted MD Charleen Kirks regarding BP still not responding to fluid.

## 2019-10-19 NOTE — ED Notes (Signed)
Admitting paged to RN per his request  

## 2019-10-19 NOTE — ED Notes (Signed)
Pt given specimen cup and informed of need for urine sample

## 2019-10-19 NOTE — ED Notes (Signed)
MS Breakfast Ordered 

## 2019-10-19 NOTE — ED Notes (Signed)
Manual BP 74/38

## 2019-10-19 NOTE — Consult Note (Addendum)
Consultation  Referring Provider: No ref. provider found Primary Care Physician:  Lacinda Axon, MD Primary Gastroenterologist:  Lemmie Evens   Reason for Consultation:  Hyponatremia, Hypoalbuminemia, fatigue, weight loss, diarrhea , and recent Ct showing diffuse colitis  HPI: Natasha Barrera is a 54 y.o. female admitted through the emergency room last evening after presenting with complaints of bilateral lower extremity edema onset over the previous few days.  Patient has history of diverticulitis and is status post sigmoid colectomy in 2013, had ventral hernia repair 2019.  She was hospitalized 7/2 through 10/04/2019 when she presented with complaints of abdominal discomfort diarrhea weight loss and was found to be hypotensive at that time and with electrolyte abnormalities. She had CT of the abdomen and pelvis with contrast during that admission that showed a diffuse colitis involving the ascending transverse and descending colon, there were a few prominent retroperitoneal nodes, otherwise unremarkable. GI was consulted and she was seen and evaluated by Dr. Tarri Glenn.  Stool studies had been obtained, GI path panel was negative, TTG and IgA were negative and fecal calprotectin was 17,000 356.  Colonoscopy was to be scheduled inpatient but apparently patient and husband refused to stay and left AMA. She has not been doing well over the past 2-1/2 weeks.  She has had significant persistent fatigue and according to the notes spending much time in bed.  Absolutely no appetite and having to force herself to eat small amounts.  She has not been having any vomiting on a regular basis, has had some nausea, no documented fever or chills.  She has had some ongoing abdominal discomfort and tenderness and has been having 3-4 loose bowel movements per day.  She has been taking over-the-counter Imodium.  Weight is down from a baseline of about 130 down to 117. On readmit last night hypotensive and tachycardic  and noted to have serum sodium of 123/potassium 3.5/albumin of 1, LFTs normal with exception of T bili 1.4 WBC 8.6, hemoglobin 10.4, platelets 593. She is being volume repleted, with electrolyte replacement. CT angio of the chest was done last night to rule out PE and was negative.  Lower extremity Dopplers to be done today.  Patient says she was feeling fine until about 6 to 7 weeks ago when she had onset of symptoms after memorial day.  She apparently had what she thought was a gastroenteritis which she contracted from her grandson.  Symptoms have not ever resolved since that time. No history of daily EtOH. On no regular meds.   Past Medical History:  Diagnosis Date  . Diverticul disease small and large intestine, no perforati or abscess   . Diverticulitis of colon   . Hypertension     Past Surgical History:  Procedure Laterality Date  . COLON SURGERY  12/25/2011  . COLONOSCOPY    . [place]  2002  . VENTRAL HERNIA REPAIR N/A 12/05/2018   Procedure: LAPAROSCOPIC VENTRAL HERNIA REPAIR WITH MESH;  Surgeon: Jovita Kussmaul, MD;  Location: [facility];  Service: General;  Laterality: N/A;    Prior to Admission medications   Medication Sig Start Date End Date Taking? Authorizing Provider  Calcium Carb-Cholecalciferol (CALCIUM+D3 PO) Take 1 tablet by mouth daily.   Yes [provider]  Cholecalciferol (VITAMIN D-3) 125 MCG (5000 UT) TABS Take 5,000 Units by mouth daily.   Yes [provider]  loperamide (IMODIUM A-D) 2 MG tablet Take 1 tablet (2 mg total) by mouth 4 (four) times daily as needed  for diarrhea or loose stools. 09/20/19  Yes Maudie Mercury, MD  Multiple Vitamin (MULTIVITAMIN WITH MINERALS) TABS tablet Take 1 tablet by mouth daily. Centrum Silver   Yes [provider]  potassium chloride (KLOR-CON) 10 MEQ tablet Take 2 tablets (20 mEq total) by mouth daily. Patient not taking: Reported on 10/18/2019 09/20/19   Maudie Mercury, MD    Current  Facility-Administered Medications  Medication Dose Route Frequency Provider Last Rate Last Admin  . acetaminophen (TYLENOL) tablet 650 mg  650 mg Oral Q6H PRN Jose Persia, MD       Or  . acetaminophen (TYLENOL) suppository 650 mg  650 mg Rectal Q6H PRN Jose Persia, MD      . enoxaparin (LOVENOX) injection 40 mg  40 mg Subcutaneous Q24H Jose Persia, MD      . lactated ringers infusion   Intravenous Continuous Asencion Noble, MD 125 mL/hr at 10/19/19 0945 New Bag at 10/19/19 0945  . multivitamin with minerals tablet 1 tablet  1 tablet Oral Daily Jose Persia, MD      . sodium chloride flush (NS) 0.9 % injection 10-40 mL  10-40 mL Intracatheter PRN Pfeiffer, Jeannie Done, MD      . sodium chloride flush (NS) 0.9 % injection 3 mL  3 mL Intravenous Once Pfeiffer, Jeannie Done, MD      . sodium chloride flush (NS) 0.9 % injection 3 mL  3 mL Intravenous Q12H Jose Persia, MD       Current Outpatient Medications  Medication Sig Dispense Refill  . Calcium Carb-Cholecalciferol (CALCIUM+D3 PO) Take 1 tablet by mouth daily.    . Cholecalciferol (VITAMIN D-3) 125 MCG (5000 UT) TABS Take 5,000 Units by mouth daily.    Marland Kitchen loperamide (IMODIUM A-D) 2 MG tablet Take 1 tablet (2 mg total) by mouth 4 (four) times daily as needed for diarrhea or loose stools. 30 tablet 0  . Multiple Vitamin (MULTIVITAMIN WITH MINERALS) TABS tablet Take 1 tablet by mouth daily. Centrum Silver    . potassium chloride (KLOR-CON) 10 MEQ tablet Take 2 tablets (20 mEq total) by mouth daily. (Patient not taking: Reported on 10/18/2019) 10 tablet 0    Allergies as of 10/18/2019  . (No Known Allergies)    Family History  Problem Relation Age of Onset  . Breast cancer Neg Hx     Social History   Socioeconomic History  . Marital status: Married    Spouse name: Not on file  . Number of children: Not on file  . Years of education: Not on file  . Highest education level: Not on file  Occupational History  . Not on file    Tobacco Use  . Smoking status: Former Research scientist (life sciences)  . Smokeless tobacco: Never Used  Vaping Use  . Vaping Use: Never used  Substance and Sexual Activity  . Alcohol use: Yes    Comment: occasional  . Drug use: No  . Sexual activity: Not on file  Other Topics Concern  . Not on file  Social History Narrative  . Not on file   Social Determinants of Health   Financial Resource Strain:   . Difficulty of Paying Living Expenses:   Food Insecurity:   . Worried About Charity fundraiser in the Last Year:   . Arboriculturist in the Last Year:   Transportation Needs:   . Film/video editor (Medical):   Marland Kitchen Lack of Transportation (Non-Medical):   Physical Activity:   . Days of Exercise per  Week:   . Minutes of Exercise per Session:   Stress:   . Feeling of Stress :   Social Connections:   . Frequency of Communication with Friends and Family:   . Frequency of Social Gatherings with Friends and Family:   . Attends Religious Services:   . Active Member of Clubs or Organizations:   . Attends Archivist Meetings:   Marland Kitchen Marital Status:   Intimate Partner Violence:   . Fear of Current or Ex-Partner:   . Emotionally Abused:   Marland Kitchen Physically Abused:   . Sexually Abused:     Review of Systems: Pertinent positive and negative review of systems were noted in the above HPI section.  All other review of systems was otherwise negative.  Physical Exam: Vital signs in last 24 hours: Temp:  [98.1 F (36.7 C)-99.2 F (37.3 C)] 99 F (37.2 C) (07/19 0535) Pulse Rate:  [83-141] 93 (07/19 0944) Resp:  [14-22] 14 (07/19 0930) BP: (68-109)/(46-75) 82/53 (07/19 0944) SpO2:  [91 %-100 %] 96 % (07/19 0944) Weight:  [53.5 kg] 53.5 kg (07/18 1620)   General:   Alert,  Well-developed, chronically ill-appearing [place] female pleasant and cooperative in NAD husband at bedside Head:  Normocephalic and atraumatic. Eyes:  Sclera clear, no icterus.   Conjunctiva pink. Ears:  Normal auditory  acuity. Nose:  No deformity, discharge,  or lesions. Mouth:  No deformity or lesions.   Neck:  Supple; no masses or thyromegaly. Lungs:  Clear throughout to auscultation.   No wheezes, crackles, or rhonchi. Heart:  Regular rate and rhythm; no murmurs, clicks, rubs,  or gallops. Abdomen:  Soft, fairly diffuse tenderness, no guarding BS active,nonpalp mass or hsm.   Rectal:  Deferred  Msk:  Symmetrical without gross deformities. . Pulses:  Normal pulses noted. Extremities: 1-2+ edema bilateral lower extremities, she also has some mild edema in the upper arms Neurologic:  Alert and  oriented x4;  grossly normal neurologically. Skin:  Intact without significant lesions or rashes.. Psych:  Alert and cooperative. Normal mood and affect.  Intake/Output from previous day: 07/18 0701 - 07/19 0700 In: 505.6 [IV Piggyback:505.6] Out: -  Intake/Output this shift: Total I/O In: 1500 [IV Piggyback:1500] Out: -   Lab Results: Recent Labs    10/18/19 1649 10/19/19 0308  WBC 8.6 5.1  HGB 10.4* 7.9*  HCT 30.9* 24.4*  PLT 593* 395   BMET Recent Labs    10/18/19 1649 10/19/19 0308 10/19/19 0830  NA 123* 125* 126*  K 3.5 3.4* 3.9  CL 86* 90* 92*  CO2 29 30 26   GLUCOSE 130* 101* 126*  BUN 6 6 5*  CREATININE 0.53 0.40* 0.40*  CALCIUM 7.3* 6.6* 6.6*   LFT Recent Labs    10/18/19 2116 10/18/19 2116 10/19/19 0308  PROT 4.4*   < > 3.7*  ALBUMIN 1.0*   < > <1.0*  AST 15   < > 13*  ALT 14   < > 11  ALKPHOS 91   < > 81  BILITOT 1.3*   < > 1.4*  BILIDIR 0.3*  --   --   IBILI 1.0*  --   --    < > = values in this interval not displayed.   PT/INR No results for input(s): LABPROT, INR in the last 72 hours. Hepatitis Panel No results for input(s): HEPBSAG, HCVAB, HEPAIGM, HEPBIGM in the last 72 hours.    IMPRESSION:  #44 54 year old [place] female with 7-week history of illness with  fatigue, lack of appetite, intermittent nausea, abdominal discomfort which has been persistent and  diarrhea with 3-4 bowel movements per day. Patient was initially admitted 10/02/2019 with above complaints.  CT of the abdomen pelvis at that time showed a diffuse colitis, etiology not clear. GI pathogen panel was negative and fecal calprotectin was markedly elevated. Plan was for colonoscopy, however patient left AMA.  They do have office follow-up scheduled.  Readmitted now with lower extremity edema onset over the past 3 days, and found to be hypotensive, tachycardic with hypoosmolar hyponatremia She has a profound hypoalbuminemia and macrocytic anemia  It is not clear at this time whether all of her issues are secondary to a protein-losing enteropathy or colopathy, versus other etiologies.  She does appear to have a severe malnutrition.  #2 history of diverticulitis, status post sigmoid colectomy 2013 and ventral hernia repair 2019 #3 history of hypertension  Plan; stool for C. Difficile Full liquid diet Nutrition consult, start boost supplements 3 times daily Supportive management and correction of metabolic derangements as per medicine service. She will need endoscopic evaluation this admission, may need EGD with gastric, small bowel biopsies in addition to colonoscopy.  I think she needs to be repleted a bit prior to considering bowel prep and sedation. We will follow with you.    Amy EsterwoodPA-C  10/19/2019, 10:03 AM  GI ATTENDING  History, laboratories, x-rays, prior hospitalization reviewed.  Agree with comprehensive consultation note as outlined above.  Patient with 80-monthhistory of abdominal discomfort, diarrhea, and progressive hypoproteinemia and hypoalbuminemia.  Now with dehydration with associated hypotension, hyponatremia, and progressive edema.  Previous CT imaging suggested nonspecific right-sided colitis.  Agree with aggressive resuscitative efforts, correction of electrolyte abnormalities, recheck stool for C. difficile.  Agree with dietary supplementation.  Will  need endoscopic evaluation including colonoscopy and upper endoscopy with biopsies.  We will follow.  JDocia Chuck PGeri Seminole, M.D. LNorman Regional Health System -Norman CampusDivision of Gastroenterology

## 2019-10-19 NOTE — ED Notes (Signed)
This RN paged the Provider regarding this pts VS and admission status.

## 2019-10-19 NOTE — ED Notes (Signed)
Paged IM senior due to blood pressure

## 2019-10-19 NOTE — ED Notes (Signed)
No further orders per MD.

## 2019-10-19 NOTE — Progress Notes (Addendum)
Subjective: Reports being tired Said she came back due to feet and ankles swelling arms swelling as well Want to have colonoscopy done Discussed third spacing, Crohn's disease Husband stated that stool sample would not be possible to collect due to only having liquid stool  Said stomach is sore as if she had done 100 sit ups Says she's not sure if SOB because not walking much due to weakness Discussed that blood pressure drop is she's dehydrated due to volume loss Reports she just wants to be normal again Husband is worried about blood clot due to bed bound  All questions and concerns were addressed.  Objective:  Vital signs in last 24 hours: Vitals:   10/19/19 1532 10/19/19 1636 10/19/19 1700 10/19/19 1730  BP: 91/70 (!) 80/51 (!) 84/52 (!) 78/45  Pulse: 99 96 92 88  Resp: 16 19 20 20   Temp:      TempSrc:      SpO2: 95% 99% 96% 98%  Weight:      Height:       General: Middle aged female, NAD, laying in bed Cardiac: RRR, no m/r/g Pulmonary: CTABL, no wheezing, rhonchi, rales Abdomen: Soft, tender to palpation diffusely, normoactive bowel sounds MSK: 2+ BL LE edema  Assessment/Plan:  Principal Problem:   Colitis Active Problems:   Hyponatremia  This is a 54 year old female with history of hypertension, diverticulitis status post sigmoid colectomy in 2015, ventral hernia status post repair, prior episode of colitis in her teens who was admitted for abd discomfort, n/v, weight loss, hypotension, and electrolyte derangements who presented with bilateral LE swelling and continued nausea, vomiting, diarrhea, weight loss, and generalized fatigue. Noted to have significant electrolyte abnormalities and hypotension.   Colitis Noted during her prior hospitalization, CT Abdomen/Pelvis revealed diffuse colitis. Stool studies were performed which revealed a very high calprotectin, negative GIP panel and negative TTG and IgA. GI was planning on inpatient colonoscopy during her prior  hospitalization however patient left AMA before this could be done. She did have a colonoscopy scheduled early next month however developed bilateral lower extremity swelling and decided to come in for evaluation. She is agreeable to colonoscopy inpatient now. Possible causes of her inflammatory colitis include autoimmune etiologies such as ulcerative colitis or Crohn's, as well as an underlying malignancy. GI consulted, agree with aggressive resuscitative efforts and correction of electrolyte abnormalities, recheck c difficile, will need endoscopic evaluation with biopsies after repletion.   -GI following, appreciate recommendations -CRP 9, ESR 23 -f/u C difficile screen -Nutrition consulted -Holding home over-the-counter Imodium A-D and Mylanta -Continue IV fluids -Will need colonoscopy and upper endoscopy with biopsies, as per GI  Hypotension Soft pressures since admission. However, patient is awake, alert and is perfusing well. -BPs ranging from 70s - 90s / 50s -MAPs ranging from 50s - 70s -Consider starting midodrine  Hypoalbuminemia Peripheral edema Patient's albumin of 1.0 at this admission. BL LE swelling is likely from 3rd spacing however husband concerned about clots, did report that she was in bed for the last 2 weeks.   -Nutrition consulted  -BL LE dopplers  Hyponatremia Hypoosmolar hyponatremic Patient's calculated serum osmolarity of 255. Currently asymptomatic, however she requires IVF as she is currently hypotensive. Giving fluids, Na 125 now.   -Monitor BMP  Hypophosphatemia Phosphorous of 1.5. Repleted, improved to 1.9. Repeat in AM.   Macrocytic anemia Folate normal, B12 elevated. Unknown etiology of this condition.   -Continue to monitor  Thrombocytosis PLT 593. Likely reactive in the setting of  inflammatory colitis.  -Monitor CBC  Hypocalcemia Her hypocalcemia corrects with albumin to 9.7  -Continue to monitor  Code status: Full Diet: Regular  diet, dietitian consult placed  DVT ppx: Lovenox 5m subq  Prior to Admission Living Arrangement: Home Anticipated Discharge Location: Home Barriers to Discharge: Continued medical management Dispo: Anticipated discharge in approximately 2-3 day(s).   JVirl Axe MD 10/19/2019, 5:35 PM Pager: [phone] 5pm on weekdays and 1pm on weekends: On Call pager [phone]

## 2019-10-19 NOTE — ED Notes (Signed)
Contacted MD Charleen Kirks regarding BP still low after bolus. New order for another bolus placed. Will continue to [place]

## 2019-10-19 NOTE — ED Notes (Signed)
Contacted MD Suezanne Jacquet regarding BP MAP below 60

## 2019-10-19 NOTE — ED Notes (Signed)
Lunch Trays Ordered @ 1101.

## 2019-10-19 NOTE — Hospital Course (Addendum)
Team advised to keep continuing eating, and ambulating as much as possible. Stressed continued work with PT.   Discussed switching to oral steroids Will address finding a SNF, once we find a location that will accept our patient, team will switch IV ABX out to oral Reports that she is still coughing up mucus  Heels still looked a little erythematous.   Concern right now is getting on the comode by herself. Team addressed concern with discharge to home, stating it would be unsafe so would highly recommend doing the temporary SNF placement.   Drainage bag looked green today, team will discuss with surgery on next steps.     8/19 States she has been feeling well this am. Discussed importance of mobilization and working with PT/OT. Also discussed likely need to continue with the drain due to continued output. She expressed understanding.   8/20: -doing well, sitting up in chair waiting for PT -will discuss plan for dispo with surgery -ostomy site somewhat sore, leaking some

## 2019-10-20 DIAGNOSIS — E861 Hypovolemia: Secondary | ICD-10-CM

## 2019-10-20 DIAGNOSIS — E876 Hypokalemia: Secondary | ICD-10-CM

## 2019-10-20 DIAGNOSIS — E43 Unspecified severe protein-calorie malnutrition: Secondary | ICD-10-CM

## 2019-10-20 LAB — BASIC METABOLIC PANEL
Anion gap: 7 (ref 5–15)
Anion gap: 6 (ref 5–15)
Anion gap: 7 (ref 5–15)
BUN: <5 mg/dL — ABNORMAL LOW (ref 6–20)
BUN: 5 mg/dL — ABNORMAL LOW (ref 6–20)
BUN: 5 mg/dL — ABNORMAL LOW (ref 6–20)
CO2: 25 mmol/L (ref 22–32)
CO2: 26 mmol/L (ref 22–32)
CO2: 23 mmol/L (ref 22–32)
Calcium: 6.7 mg/dL — ABNORMAL LOW (ref 8.9–10.3)
Calcium: 6.7 mg/dL — ABNORMAL LOW (ref 8.9–10.3)
Calcium: 6.8 mg/dL — ABNORMAL LOW (ref 8.9–10.3)
Chloride: 95 mmol/L — ABNORMAL LOW (ref 98–111)
Chloride: 96 mmol/L — ABNORMAL LOW (ref 98–111)
Chloride: 100 mmol/L (ref 98–111)
Creatinine, Ser: 0.47 mg/dL (ref 0.44–1.00)
Creatinine, Ser: 0.48 mg/dL (ref 0.44–1.00)
Creatinine, Ser: 0.45 mg/dL (ref 0.44–1.00)
GFR calc Af Amer: >60 mL/min (ref >60)
GFR calc Af Amer: >60 mL/min (ref >60)
GFR calc Af Amer: >60 mL/min (ref >60)
GFR calc non Af Amer: >60 mL/min (ref >60)
GFR calc non Af Amer: >60 mL/min (ref >60)
GFR calc non Af Amer: >60 mL/min (ref >60)
Glucose, Bld: 92 mg/dL (ref 70–99)
Glucose, Bld: 96 mg/dL (ref 70–99)
Glucose, Bld: 105 mg/dL — ABNORMAL HIGH (ref 70–99)
Potassium: 3.8 mmol/L (ref 3.5–5.1)
Potassium: 3.6 mmol/L (ref 3.5–5.1)
Potassium: 3.3 mmol/L — ABNORMAL LOW (ref 3.5–5.1)
Sodium: 127 mmol/L — ABNORMAL LOW (ref 135–145)
Sodium: 128 mmol/L — ABNORMAL LOW (ref 135–145)
Sodium: 130 mmol/L — ABNORMAL LOW (ref 135–145)

## 2019-10-20 LAB — C DIFFICILE QUICK SCREEN W PCR REFLEX
C Diff antigen: NEGATIVE
C Diff interpretation: NOT DETECTED
C Diff toxin: NEGATIVE

## 2019-10-20 MED ORDER — PEG-KCL-NACL-NASULF-NA ASC-C 100 G PO SOLR: 1.0000 | Freq: Once | ORAL | Status: DC

## 2019-10-20 MED ORDER — PEG-KCL-NACL-NASULF-NA ASC-C 100 G PO SOLR
0.5000 | Freq: Once | ORAL | Status: AC
  Administered 2019-10-20: 100 g via ORAL
  Filled 2019-10-20: qty 1

## 2019-10-20 MED ORDER — SODIUM CHLORIDE 0.9 % IV SOLN: INTRAVENOUS | Status: AC

## 2019-10-20 MED ORDER — PROSOURCE PLUS PO LIQD
30.0000 mL | Freq: Two times a day (BID) | ORAL | Status: DC
  Administered 2019-10-21: 30 mL via ORAL
  Filled 2019-10-20 (×3): qty 30

## 2019-10-20 MED ORDER — BOOST / RESOURCE BREEZE PO LIQD CUSTOM
1.0000 | Freq: Three times a day (TID) | ORAL | Status: DC
  Administered 2019-10-21 – 2019-10-22 (×2): 1 via ORAL

## 2019-10-20 MED ORDER — POTASSIUM CHLORIDE CRYS ER 20 MEQ PO TBCR
40.0000 meq | EXTENDED_RELEASE_TABLET | Freq: Once | ORAL | Status: AC
  Administered 2019-10-20: 40 meq via ORAL
  Filled 2019-10-20: qty 2

## 2019-10-20 MED ORDER — ENSURE ENLIVE PO LIQD
237.0000 mL | Freq: Three times a day (TID) | ORAL | Status: AC
  Administered 2019-10-20: 237 mL via ORAL

## 2019-10-20 MED ORDER — SODIUM CHLORIDE 0.9 % IV BOLUS
1000.0000 mL | Freq: Once | INTRAVENOUS | Status: AC
  Administered 2019-10-20: 1000 mL via INTRAVENOUS

## 2019-10-20 NOTE — Progress Notes (Addendum)
Subjective: Patient reports feeling a little better today, still feels a little tired. She has been able to eat a little bit but not much. Reports that she had three episodes of diarrhea since last night.  Discussed that her blood pressure remains low and that we are giving fluids and medications to try and bring this up. States that this low blood pressure is new, usually it is normal. Discussed that the ultrasound to check for clots in the legs came back negative. Patient states that her husband was really worried about that so she is glad that it was negative.  All questions and concerns were addressed.  Objective:  Vital signs in last 24 hours: Vitals:   10/19/19 2322 10/20/19 0200 10/20/19 0559 10/20/19 1349  BP: (!) 84/49 (!) 81/53 (!) 82/49 (!) 86/62  Pulse: 88 85 87 84  Resp: 17 17 17 18   Temp: 98.6 F (37 C) 98.4 F (36.9 C) 98.4 F (36.9 C) 98.5 F (36.9 C)  TempSrc:    Oral  SpO2: 93% 95% 93% 93%  Weight:      Height:       Physical Exam: General: Middle-aged female, lying in bed, NAD. CV: Regular rate and normal rhythm. No m/r/g Pulm: CTABL. No adventitious sounds noted. Abdomen: Soft, non-distended. Tender to palpation in the LUQ, otherwise non-tender. Normoactive bowel sounds. MSK: 2+ bilateral LE and UE edema  Assessment/Plan:  Principal Problem:   Colitis Active Problems:   Hyponatremia   Severe protein-calorie malnutrition ([place])  Patient is a 54 year old female with history of hypertension, diverticulitis status post sigmoid colectomy in 2015, ventral hernia status post repair, prior episode of colitis in her teens who was admitted for abd discomfort, n/v, weight loss, hypotension, and electrolyte derangements who presented with bilateral LE swelling and continued nausea, vomiting, diarrhea, weight loss, and generalized fatigue. Noted to have significant electrolyte abnormalities and hypotension.   Colitis --> inflammatory (more likely) vs  infectious Noted during her prior hospitalization, CT Abdomen/Pelvis revealed diffuse colitis. Stool studies were performed which revealed a very high calprotectin, negative GIP panel and negative TTG and IgA. GI was planning on inpatient colonoscopy during her prior hospitalization however patient left AMA before this could be done. She did have a colonoscopy scheduled early next month however developed bilateral lower extremity swelling and decided to come in for evaluation. She is agreeable to colonoscopy inpatient now. Possible causes of her inflammatory colitis include autoimmune etiologies such as ulcerative colitis or Crohn's, as well as an underlying malignancy. GI consulted, agree with aggressive resuscitative efforts and correction of electrolyte abnormalities, will need colonoscopic and endoscopic evaluation with biopsies after repletion.  -GI following, appreciate recommendations -CRP 9, ESR 23 -Recent fecal calprotectin markedly elevated --> 17,356 (last admission) -Recent GI path panel negative (last admission) -C diff screen negative -Nutrition consulted -Holding home over-the-counter Imodium A-D and Mylanta -Continue IV fluids -GI to perform colonoscopy and EGD on Thursday (10/22/19)  Hypoosmolar hyponatremic hypovolemia Patient's calculated serum osmolarity of 255. Currently asymptomatic, however she requires IVF as she is currently hypotensive. Patient still has not reached fluid resuscitation goals as sodium is still low. Will continue with IV fluids. -Serum osm 255, urine osm 261, urine sodium <10 -Na 129 --> 127 --> 128 -Increased IV fluids (administered 1L bolus NS and 1L infusion at 100cc/hr) -Monitor BMP  Hypotension Soft pressures since admission. However, patient is awake, alert and is perfusing well. Started midodrine overnight due to very soft BPs, however will hold to determine  if fluid administration alone will stabilize pressures as hypotension likely due to  chronic hypovolemia. -BPs ranging from 80s / 50s -MAPs ranging in the 60s -Hold midodrine  Hypoalbuminemia Peripheral edema Patient's albumin of 1.0 at this admission. Could be due to protein-losing enteropathy 2/2 IBD (Crohn's), however unable to tell until patient undergoes colonoscopy with biopsies. -Nutrition consulted  -BL LE dopplers negative  Hypophosphatemia Phosphorous of 1.5. Repleted, improved to 1.9.   Macrocytic anemia Folate normal, B12 elevated. Unknown etiology of this condition.  -Continue to monitor  Thrombocytosis PLT 593. Likely reactive in the setting of inflammatory colitis. -Monitor CBC  Hypocalcemia Her hypocalcemia corrects with albumin to 9.7 -Continue to monitor  Prior to Admission Living Arrangement: Home Anticipated Discharge Location: TBD Barriers to Discharge: Continued medical management Dispo: Anticipated discharge in approximately 2-3 day(s).   Virl Axe, MD 10/20/2019, 3:17 PM Pager: [phone] After 5pm on weekdays and 1pm on weekends: On Call pager [phone]

## 2019-10-20 NOTE — Progress Notes (Addendum)
Patient ID: Natasha Barrera, female   DOB: 24-May-1965, 54 y.o.   MRN: [id number]    Progress Note   Subjective    Day # 2  CC; severe weakness, fatigue, weight loss, diarrhea hyponatremia and severe hypoalbuminemia.  Recent CT showing diffuse colitis.  Patient says she is feeling a bit better, was able to get some rest.  She has had about 4 episodes of diarrhea today, and does admit to some abdominal discomfort, more left-sided.  Able to tolerate the full liquids and trying to drink supplements.  C. difficile quick screen negative Sodium 128/potassium 3.6 CRP 9/esr 23 Urine sodium yesterday less than 10  Lower extremity Dopplers negative for DVT     Objective   Vital signs in last 24 hours: Temp:  [97.9 F (36.6 C)-98.9 F (37.2 C)] 98.4 F (36.9 C) (07/20 0559) Pulse Rate:  [84-101] 87 (07/20 0559) Resp:  [11-20] 17 (07/20 0559) BP: (70-108)/(43-70) 82/49 (07/20 0559) SpO2:  [93 %-100 %] 93 % (07/20 0559) Last BM Date: 10/20/19 General:    [place] female in NAD, chronically ill-appearing Heart:  Regular rate and rhythm; no murmurs Lungs: Respirations even and unlabored, lungs CTA bilaterally Abdomen:  Soft, mild rather generalized tenderness no guarding ,nondistended. Normal bowel sounds. Extremities:  1-2 + edema Neurologic:  Alert and oriented,  grossly normal neurologically. Psych:  Cooperative. Normal mood and affect.  Intake/Output from previous day: 07/19 0701 - 07/20 0700 In: 2748.4 [P.O.:320; I.V.:928.4; IV Piggyback:1500] Out: 75 [Stool:75] Intake/Output this shift: No intake/output data recorded.  Lab Results: Recent Labs    10/18/19 1649 10/19/19 0308  WBC 8.6 5.1  HGB 10.4* 7.9*  HCT 30.9* 24.4*  PLT 593* 395   BMET Recent Labs    10/19/19 1615 10/19/19 2237 10/20/19 0338  NA 129* 129* 127*  K 3.6 3.3* 3.8  CL 94* 97* 95*  CO2 28 26 25   GLUCOSE 97 101* 92  BUN 5* <5* <5*  CREATININE 0.41* 0.43* 0.47  CALCIUM 6.7* 6.6* 6.7*    LFT Recent Labs    10/18/19 2116 10/18/19 2116 10/19/19 0308  PROT 4.4*   < > 3.7*  ALBUMIN 1.0*   < > <1.0*  AST 15   < > 13*  ALT 14   < > 11  ALKPHOS 91   < > 81  BILITOT 1.3*   < > 1.4*  BILIDIR 0.3*  --   --   IBILI 1.0*  --   --    < > = values in this interval not displayed.   PT/INR No results for input(s): LABPROT, INR in the last 72 hours.  Studies/Results: CT Angio Chest PE W/Cm &/Or Wo Cm  Result Date: 10/18/2019 CLINICAL DATA:  Shortness of breath, bilateral lower extremity edema EXAM: CT ANGIOGRAPHY CHEST WITH CONTRAST TECHNIQUE: Multidetector CT imaging of the chest was performed using the standard protocol during bolus administration of intravenous contrast. Multiplanar CT image reconstructions and MIPs were obtained to evaluate the vascular anatomy. CONTRAST:  64m OMNIPAQUE IOHEXOL 350 MG/ML SOLN COMPARISON:  CT abdomen pelvis 10/02/2019 FINDINGS: Cardiovascular: Satisfactory opacification the pulmonary arteries to the segmental level. No pulmonary artery filling defects are identified. Central pulmonary arteries are normal caliber. Normal heart size. Trace pericardial fluid. Coronary artery calcifications are present. The aorta is normal caliber. No acute luminal abnormality nor periaortic stranding or hemorrhage. Shared origin of the brachiocephalic and left common carotid artery. Minimal plaque in the proximal great vessel origins without other acute abnormality. Mediastinum/Nodes:  No mediastinal fluid or gas. Normal thyroid gland and thoracic inlet. No acute abnormality of the trachea. Small hiatal hernia, esophagus otherwise unremarkable. No worrisome mediastinal, hilar or axillary adenopathy. Lungs/Pleura: Mild centrilobular predominant emphysematous changes in lungs. Diffuse airways thickening and scattered secretions. No consolidation, features of edema, pneumothorax, or effusion. No suspicious pulmonary nodules or masses. Tiny fat containing right Bochdalek's  hernia. Upper Abdomen: No acute abnormalities present in the visualized portions of the upper abdomen. Musculoskeletal: No chest wall mass or suspicious bone lesions identified. Minimal degenerative changes in the spine Review of the MIP images confirms the above findings. IMPRESSION: 1. No evidence of acute pulmonary artery filling defects to suggest pulmonary embolism. 2. No convincing features of edema. 3. Emphysema (ICD10-J43.9). Additional bronchitic changes could be acute or chronic, appearing similar to comparison abdominopelvic CT. Constellation of findings could reflect smoking related changes. Correlate with patient history. 4. Coronary artery atherosclerosis. 5. Aortic Atherosclerosis (ICD10-I70.0) Electronically Signed   By: Lovena Le M.D.   On: 10/18/2019 21:56   VAS [place] LOWER EXTREMITY VENOUS (DVT)  Result Date: 10/19/2019  Lower Venous DVTStudy Indications: Edema.  Risk Factors: None identified. Comparison Study: No prior studies. Performing Technologist: Oliver Hum RVT  Examination Guidelines: A complete evaluation includes B-mode imaging, spectral Doppler, color Doppler, and power Doppler as needed of all accessible portions of each vessel. Bilateral testing is considered an integral part of a complete examination. Limited examinations for reoccurring indications may be performed as noted. The reflux portion of the exam is performed with the patient in reverse Trendelenburg.  +---------+---------------+---------+-----------+----------+--------------+ RIGHT    CompressibilityPhasicitySpontaneityPropertiesThrombus Aging +---------+---------------+---------+-----------+----------+--------------+ CFV      Full           Yes      Yes                                 +---------+---------------+---------+-----------+----------+--------------+ SFJ      Full                                                         +---------+---------------+---------+-----------+----------+--------------+ FV Prox  Full                                                        +---------+---------------+---------+-----------+----------+--------------+ FV Mid   Full                                                        +---------+---------------+---------+-----------+----------+--------------+ FV DistalFull                                                        +---------+---------------+---------+-----------+----------+--------------+ PFV      Full                                                        +---------+---------------+---------+-----------+----------+--------------+  POP      Full           Yes      Yes                                 +---------+---------------+---------+-----------+----------+--------------+ PTV      Full                                                        +---------+---------------+---------+-----------+----------+--------------+ PERO     Full                                                        +---------+---------------+---------+-----------+----------+--------------+   +---------+---------------+---------+-----------+----------+--------------+ LEFT     CompressibilityPhasicitySpontaneityPropertiesThrombus Aging +---------+---------------+---------+-----------+----------+--------------+ CFV      Full           Yes      Yes                                 +---------+---------------+---------+-----------+----------+--------------+ SFJ      Full                                                        +---------+---------------+---------+-----------+----------+--------------+ FV Prox  Full                                                        +---------+---------------+---------+-----------+----------+--------------+ FV Mid   Full                                                         +---------+---------------+---------+-----------+----------+--------------+ FV DistalFull                                                        +---------+---------------+---------+-----------+----------+--------------+ PFV      Full                                                        +---------+---------------+---------+-----------+----------+--------------+ POP      Full           Yes      Yes                                 +---------+---------------+---------+-----------+----------+--------------+  PTV      Full                                                        +---------+---------------+---------+-----------+----------+--------------+ PERO     Full                                                        +---------+---------------+---------+-----------+----------+--------------+     Summary: RIGHT: - There is no evidence of deep vein thrombosis in the lower extremity.  - No cystic structure found in the popliteal fossa.  LEFT: - There is no evidence of deep vein thrombosis in the lower extremity.  - No cystic structure found in the popliteal fossa.  *See table(s) above for measurements and observations. Electronically signed by Monica Martinez MD on 10/19/2019 at 2:50:20 PM.    Final        Assessment / Plan:    #37 53 year old [place] female with 28-monthhistory of illness associated with significant fatigue, weakness, lack of appetite, weight loss generalized abdominal discomfort diarrhea, intermittent nausea. CT abdomen and pelvis about 2-1/2 weeks ago at time of initial admission showed a diffuse colitis etiology unclear.  She has a markedly elevated fecal calprotectin, and this admission moderate elevation of CRP and sed rate. Infectious stool studies have been negative  , Rule out IBD, lymphocytic colitis, versus other protein-losing colopathy or enteropathy, consider malignancy, consider amyloid.  #2 severe hypoalbuminemia #3 hyponatremia improving #4  hypotension improving #5 macrocytic anemia #6 history of diverticulitis, status post sigmoid colectomy 2013 and ventral hernia repair 2019  Plan; continue full liquids today, switch supplements to Ensure per patient preference We will plan bowel prep tomorrow if electrolytes are reasonable, then colonoscopy and EGD for Thursday with Dr. PHenrene Pastor  Both procedures were discussed in detail with the patient today including indications risks and benefits and she is agreeable to proceed.  Principal Problem:   Colitis Active Problems:   Hyponatremia   Severe protein-calorie malnutrition (HAssumption   LOS: 1 day   Amy Esterwood PA-C 10/20/2019, 9:57 AM  GI ATTENDING  Interval history data reviewed.  Agree with interval progress note as outlined above.  Continue with supportive measures.  Tentative plans for endoscopic evaluations with biopsies Thursday.  JDocia Chuck PGeri Seminole, M.D. LChoctaw Regional Medical CenterDivision of Gastroenterology

## 2019-10-20 NOTE — Progress Notes (Signed)
Initial Nutrition Assessment  RD working remotely.  DOCUMENTATION CODES:   Not applicable  INTERVENTION:   -D/c Ensure Enlive po TID, each supplement provides 350 kcal and 20 grams of protein, until 10/20/19 -Boost Breeze po TID, each supplement provides 250 kcal and 9 grams of protein -MVI with minerals daily -30 ml Prosource Plus BID, each supplement provides 100 kcals and 15 grams protein  NUTRITION DIAGNOSIS:   Inadequate oral intake related to altered GI function as evidenced by meal completion < 50%.  GOAL:   Patient will meet greater than or equal to 90% of their needs  MONITOR:   PO intake, Supplement acceptance, Diet advancement, Labs, Weight trends, Skin, I & O's  REASON FOR ASSESSMENT:   Malnutrition Screening Tool, Consult Assessment of nutrition requirement/status  ASSESSMENT:   Natasha Barrera is a 54 year old female with past medical history of HTN, diverticulitis s/p sigmoid colectomy in 2013, ventral hernia (repaired), and prior episode of colitis, and a recent hospitalization for diarrhea, hypotension and electrolyte derangements secondary to colitis who returned nine days after leaving AMA found to have continued electrolyte derangements with new onset peripheral edema.  Pt admitted with colitis.   Reviewed I/O's: +2.7 L x 24 hours and +3.2 L since admission  Attempted to speak with pt via phone, however, no answer.   Pt currently on a full liquid diet, however, noted meal completion 0%. Pt has accepted one Ensure Enlive supplement; noted she refused this morning's dose.   Reviewed wt hx; pt has experienced a 7.9% wt loss over the past month, which is significant for time frame.   Per GI notes, plan for colonoscopy and EGD on 10/22/19.   Albumin has a half-life of 21 days and is strongly affected by stress response and inflammatory process, therefore, do not expect to see an improvement in this lab value during acute hospitalization. When a  patient presents with low albumin, it is likely skewed due to the acute inflammatory response.  Unless it is suspected that patient had poor PO intake or malnutrition prior to admission, then RD should not be consulted solely for low albumin. Note that low albumin is no longer used to diagnose malnutrition; [place] uses the new malnutrition guidelines published by the American Society for Parenteral and Enteral Nutrition (A.S.P.E.N.) and the Academy of Nutrition and Dietetics (AND).    Medications reviewed and include 0.9% sodium chloride infusion @ 100 ml/hr.   Labs reviewed: Na: 127.   Diet Order:   Diet Order            Diet clear liquid Room service appropriate? Yes; Fluid consistency: Thin  Diet effective 0500 tomorrow           Diet full liquid Room service appropriate? Yes; Fluid consistency: Thin  Diet effective now                 EDUCATION NEEDS:   No education needs have been identified at this time  Skin:  Skin Assessment: Reviewed RN Assessment  Last BM:  10/20/19  Height:   Ht Readings from Last 1 Encounters:  10/18/19 5' 4"  (1.626 m)    Weight:   Wt Readings from Last 1 Encounters:  10/18/19 53.5 kg    Ideal Body Weight:  54.5 kg  BMI:  Body mass index is 20.25 kg/m.  Estimated Nutritional Needs:   Kcal:  1700-1900  Protein:  90-105 grams  Fluid:  > 1.7 L    Loistine Chance, RD, LDN,  CDCES Registered Dietitian II Certified Diabetes Care and Education Specialist Please refer to [facility] for RD and/or RD on-call/weekend/after hours pager

## 2019-10-21 ENCOUNTER — Inpatient Hospital Stay (HOSPITAL_COMMUNITY): Payer: 59 | Admitting: Certified Registered"

## 2019-10-21 ENCOUNTER — Encounter (HOSPITAL_COMMUNITY)
Admission: EM | Disposition: A | Discharge status: Rehabilitation Facility | Payer: Self-pay | Source: Home / Self Care | Attending: Student in an Organized Health Care Education/Training Program

## 2019-10-21 ENCOUNTER — Encounter (HOSPITAL_COMMUNITY): Payer: Self-pay | Admitting: Internal Medicine

## 2019-10-21 DIAGNOSIS — R634 Abnormal weight loss: Secondary | ICD-10-CM

## 2019-10-21 DIAGNOSIS — K222 Esophageal obstruction: Secondary | ICD-10-CM

## 2019-10-21 DIAGNOSIS — D649 Anemia, unspecified: Secondary | ICD-10-CM

## 2019-10-21 DIAGNOSIS — D638 Anemia in other chronic diseases classified elsewhere: Secondary | ICD-10-CM

## 2019-10-21 HISTORY — PX: BIOPSY: SHX5522

## 2019-10-21 HISTORY — PX: COLONOSCOPY WITH PROPOFOL: SHX5780

## 2019-10-21 HISTORY — PX: ESOPHAGOGASTRODUODENOSCOPY (EGD) WITH PROPOFOL: SHX5813

## 2019-10-21 LAB — CBC
HCT: 19.5 % — ABNORMAL LOW (ref 36.0–46.0)
Hemoglobin: 6.3 g/dL — CL (ref 12.0–15.0)
MCH: 33.5 pg (ref 26.0–34.0)
MCHC: 32.3 g/dL (ref 30.0–36.0)
MCV: 103.7 fL — ABNORMAL HIGH (ref 80.0–100.0)
Platelets: 362 10*3/uL (ref 150–400)
RBC: 1.88 MIL/uL — ABNORMAL LOW (ref 3.87–5.11)
RDW: 17.7 % — ABNORMAL HIGH (ref 11.5–15.5)
WBC: 4.2 10*3/uL (ref 4.0–10.5)
nRBC: 0 % (ref 0.0–0.2)

## 2019-10-21 LAB — POCT I-STAT, CHEM 8
BUN: 5 mg/dL — ABNORMAL LOW (ref 6–20)
Calcium, Ion: 1.06 mmol/L — ABNORMAL LOW (ref 1.15–1.40)
Chloride: 98 mmol/L (ref 98–111)
Creatinine, Ser: <0.2 mg/dL — ABNORMAL LOW (ref 0.44–1.00)
Glucose, Bld: 87 mg/dL (ref 70–99)
HCT: 18 % — ABNORMAL LOW (ref 36.0–46.0)
Hemoglobin: 6.1 g/dL — CL (ref 12.0–15.0)
Potassium: 2.6 mmol/L — CL (ref 3.5–5.1)
Sodium: 135 mmol/L (ref 135–145)
TCO2: 20 mmol/L — ABNORMAL LOW (ref 22–32)

## 2019-10-21 LAB — BASIC METABOLIC PANEL
Anion gap: 9 (ref 5–15)
BUN: 6 mg/dL (ref 6–20)
CO2: 22 mmol/L (ref 22–32)
Calcium: 7.3 mg/dL — ABNORMAL LOW (ref 8.9–10.3)
Chloride: 101 mmol/L (ref 98–111)
Creatinine, Ser: 0.43 mg/dL — ABNORMAL LOW (ref 0.44–1.00)
GFR calc Af Amer: >60 mL/min (ref >60)
GFR calc non Af Amer: >60 mL/min (ref >60)
Glucose, Bld: 114 mg/dL — ABNORMAL HIGH (ref 70–99)
Potassium: 3.8 mmol/L (ref 3.5–5.1)
Sodium: 132 mmol/L — ABNORMAL LOW (ref 135–145)

## 2019-10-21 LAB — PHOSPHORUS: Phosphorus: 4 mg/dL (ref 2.5–4.6)

## 2019-10-21 LAB — PREPARE RBC (CROSSMATCH)

## 2019-10-21 LAB — ABO/RH: ABO/RH(D): O POS

## 2019-10-21 SURGERY — COLONOSCOPY WITH PROPOFOL
Anesthesia: Monitor Anesthesia Care

## 2019-10-21 MED ORDER — ALBUMIN HUMAN 25 % IV SOLN
25.0000 g | Freq: Once | INTRAVENOUS | Status: DC
  Filled 2019-10-21: qty 100

## 2019-10-21 MED ORDER — CALCIUM CHLORIDE 10 % IV SOLN
INTRAVENOUS | Status: DC | PRN
Start: 2019-10-21 — End: 2019-10-21
  Administered 2019-10-21 (×4): 100 mg via INTRAVENOUS

## 2019-10-21 MED ORDER — LACTATED RINGERS IV SOLN
INTRAVENOUS | Status: DC | PRN
Start: 2019-10-21 — End: 2019-10-21

## 2019-10-21 MED ORDER — METHYLPREDNISOLONE SODIUM SUCC 40 MG IJ SOLR
40.0000 mg | Freq: Two times a day (BID) | INTRAMUSCULAR | Status: DC
  Administered 2019-10-21 – 2019-11-04 (×28): 40 mg via INTRAVENOUS
  Filled 2019-10-21 (×28): qty 1

## 2019-10-21 MED ORDER — PROPOFOL 10 MG/ML IV BOLUS
INTRAVENOUS | Status: DC | PRN
  Administered 2019-10-21 (×2): 20 mg via INTRAVENOUS
  Administered 2019-10-21: 10 mg via INTRAVENOUS
  Administered 2019-10-21: 20 mg via INTRAVENOUS
  Administered 2019-10-21: 30 mg via INTRAVENOUS
  Administered 2019-10-21 (×2): 20 mg via INTRAVENOUS

## 2019-10-21 MED ORDER — POTASSIUM CHLORIDE CRYS ER 20 MEQ PO TBCR
40.0000 meq | EXTENDED_RELEASE_TABLET | Freq: Once | ORAL | Status: AC
  Administered 2019-10-21: 40 meq via ORAL
  Filled 2019-10-21: qty 2

## 2019-10-21 MED ORDER — PHENYLEPHRINE HCL-NACL 10-0.9 MG/250ML-% IV SOLN
INTRAVENOUS | Status: DC | PRN
  Administered 2019-10-21: 25 ug/min via INTRAVENOUS

## 2019-10-21 MED ORDER — ALBUMIN HUMAN 5 % IV SOLN
INTRAVENOUS | Status: AC
  Filled 2019-10-21: qty 500

## 2019-10-21 MED ORDER — POTASSIUM CHLORIDE 10 MEQ/100ML IV SOLN
10.0000 meq | INTRAVENOUS | Status: AC
  Administered 2019-10-21 – 2019-10-22 (×6): 10 meq via INTRAVENOUS
  Filled 2019-10-21 (×7): qty 100

## 2019-10-21 MED ORDER — POTASSIUM CHLORIDE 10 MEQ/100ML IV SOLN
10.0000 meq | INTRAVENOUS | Status: AC
  Administered 2019-10-21 – 2019-10-22 (×5): 10 meq via INTRAVENOUS
  Filled 2019-10-21 (×3): qty 100

## 2019-10-21 MED ORDER — ENOXAPARIN SODIUM 40 MG/0.4ML ~~LOC~~ SOLN
40.0000 mg | SUBCUTANEOUS | Status: DC
  Administered 2019-10-22 – 2019-10-23 (×2): 40 mg via SUBCUTANEOUS
  Filled 2019-10-21 (×2): qty 0.4

## 2019-10-21 MED ORDER — PROPOFOL 500 MG/50ML IV EMUL
INTRAVENOUS | Status: DC | PRN
  Administered 2019-10-21: 50 ug/kg/min via INTRAVENOUS

## 2019-10-21 MED ORDER — ALBUMIN HUMAN 5 % IV SOLN
25.0000 g | Freq: Once | INTRAVENOUS | Status: AC
  Administered 2019-10-21: 25 g via INTRAVENOUS

## 2019-10-21 SURGICAL SUPPLY — 24 items

## 2019-10-21 NOTE — Progress Notes (Addendum)
Assumed care of pt, remains in room 53, husband at bedside, see flow sheet for vs, denies pain, safety maintained, comfort measures provided, drsg to left groin remains intact, no active bleeding noted, soft to touch, drsg placed to left wrist SG drainage continues to ooze per pt, new gown given

## 2019-10-21 NOTE — Op Note (Signed)
[facility] Patient Name: Natasha Barrera Procedure Date : 10/21/2019 MRN: [id number] Attending MD: Docia Chuck. Henrene Pastor , MD Date of Birth: 07/07/1965 CSN: [id number] Age: 54 Admit Type: Inpatient Procedure:                Upper GI endoscopy with biopsies Indications:              Anemia, Diarrhea, Weight loss. Possible                            protein-losing enteropathy. Abnormal colonoscopy Providers:                Docia Chuck. Henrene Pastor, MD, Doristine Johns, RN, Tyna Jaksch Technician, William Dalton, Technician Referring MD:             [facility] Medicines:                Monitored Anesthesia Care Complications:            No immediate complications. Estimated Blood Loss:     Estimated blood loss: none. Procedure:                Pre-Anesthesia Assessment:                           - Prior to the procedure, a History and Physical                            was performed, and patient medications and                            allergies were reviewed. The patient's tolerance of                            previous anesthesia was also reviewed. The risks                            and benefits of the procedure and the sedation                            options and risks were discussed with the patient.                            All questions were answered, and informed consent                            was obtained. Prior Anticoagulants: The patient has                            taken no previous anticoagulant or antiplatelet                            agents. ASA Grade Assessment: II - A patient with  mild systemic disease. After reviewing the risks                            and benefits, the patient was deemed in                            satisfactory condition to undergo the procedure.                           After obtaining informed consent, the endoscope was                            passed under direct  vision. Throughout the                            procedure, the patient's blood pressure, pulse, and                            oxygen saturations were monitored continuously. The                            [id number]-H190 ([id number] Olympus gastroscope was                            introduced through the mouth, and advanced to the                            third part of duodenum. The upper GI endoscopy was                            accomplished without difficulty. The patient                            tolerated the procedure well. Scope In: Scope Out: Findings:      The esophagus revealed an incidental large caliber distal ring.       Otherwise normal.      The stomach was normal save small hiatal hernia.      The examined duodenum was normal. Biopsies were taken with a cold       forceps for histology.      The cardia and gastric fundus were normal on retroflexion. Impression:               1. Incidental esophageal ring                           2. Otherwise normal EGD status post duodenal                            biopsies Recommendation:           1. See colonoscopy report for recommendations                           2. Follow-up biopsies Procedure Code(s):        --- Professional ---  56720, Esophagogastroduodenoscopy, flexible,                            transoral; with biopsy, single or multiple Diagnosis Code(s):        --- Professional ---                           D64.9, Anemia, unspecified                           R19.7, Diarrhea, unspecified                           R63.4, Abnormal weight loss CPT copyright 2019 American Medical Association. All rights reserved. The codes documented in this report are preliminary and upon coder review may  be revised to meet current compliance requirements. Docia Chuck. Henrene Pastor, MD 10/21/2019 1:53:54 PM This report has been signed electronically. Number of Addenda: 0

## 2019-10-21 NOTE — Anesthesia Procedure Notes (Signed)
Procedure Name: MAC Date/Time: 10/21/2019 12:39 PM Performed by: Barrington Ellison, CRNA Pre-anesthesia Checklist: Patient identified, Emergency Drugs available, Suction available, Patient being monitored and Timeout performed Patient Re-evaluated:Patient Re-evaluated prior to induction Oxygen Delivery Method: Nasal cannula

## 2019-10-21 NOTE — Anesthesia Preprocedure Evaluation (Signed)
Anesthesia Evaluation  Patient identified by MRN, date of birth, ID band Patient awake    Airway Mallampati: II   Neck ROM: Full    Dental   Pulmonary former smoker,    breath sounds clear to auscultation       Cardiovascular hypertension,  Rhythm:Regular Rate:Normal     Neuro/Psych    GI/Hepatic   Endo/Other    Renal/GU      Musculoskeletal   Abdominal   Peds  Hematology   Anesthesia Other Findings   Reproductive/Obstetrics                             Anesthesia Physical Anesthesia Plan  ASA: III  Anesthesia Plan: MAC   Post-op Pain Management:    Induction: Intravenous  PONV Risk Score and Plan: Propofol infusion  Airway Management Planned: Natural Airway and Simple Face Mask  Additional Equipment: Arterial line  Intra-op Plan:   Post-operative Plan:   Informed Consent: I have reviewed the patients History and Physical, chart, labs and discussed the procedure including the risks, benefits and alternatives for the proposed anesthesia with the patient or authorized representative who has indicated his/her understanding and acceptance.       Plan Discussed with: CRNA and Anesthesiologist  Anesthesia Plan Comments:         Anesthesia Quick Evaluation

## 2019-10-21 NOTE — Progress Notes (Signed)
Anesthesiology note:  Natasha Barrera is a 54 year old female admitted on 7/18 with abdominal pain, diarrhea, weight loss,  lower extremity edema, and electrolyte abnormalities.  CT scan was suggestive of  diffuse colitis.   She has been noted to be hypotensive, hyponatremic, hypo-osmolar, and anemic since admission.  Her albumin has been less than 1.She is now scheduled to undergo upper upper and lower endoscopy.  When she was seen in the holding area prior to her endoscopy her blood pressure was 75/40. She was awake and alert.  She was then given 500 cc of 5% albumin but her blood pressure remained 63-81 systolic.  An arterial line was then inserted into the left femoral artery.  This was a difficult to insert and previous attempts at both radial arteries and both brachial arteries were unsuccessful due to inability to thread the catheters or or thread a wire into the arteries.  Following insertion into the art line, she was found to have H/H of 6.3/19.5. her last H/H was 7.9/24 on 7/19.  She then underwent colonoscopy and upper endoscopy and her blood pressure was stable throughout the procedure.  2 units of packed red cells were ordered which will be started in the endoscopy unit.  After conversation with the internal medicine service, it was decided that she will then be transferred to stepdown. Her art line BP has been noticeably higher that her cuff pressure.  Post-op  VS: T-36.4 BP- 105/41 HR- 73 RR 17 O2 sat 99%  Roberts Gaudy, MD

## 2019-10-21 NOTE — Op Note (Signed)
[facility] Patient Name: Natasha Barrera Procedure Date : 10/21/2019 MRN: [id number] Attending MD: Docia Chuck. Henrene Pastor , MD Date of Birth: 1965-07-25 CSN: [id number] Age: 54 Admit Type: Inpatient Procedure:                Colonoscopy with biopsies Indications:              Clinically significant diarrhea of unexplained                            origin, Abnormal CT of the GI tract, Weight loss.                            Profound hypoalbuminemia. Rule out protein-losing                            enteropathy. Remote history of sigmoid resection                            for diverticulitis. Providers:                Docia Chuck. Henrene Pastor, MD, Doristine Johns, RN, Tyna Jaksch Technician, William Dalton, Technician Referring MD:             [facility] hospitalist Medicines:                Monitored Anesthesia Care Complications:            No immediate complications. Estimated blood loss:                            None. Estimated Blood Loss:     Estimated blood loss: none. Procedure:                Pre-Anesthesia Assessment:                           - Prior to the procedure, a History and Physical                            was performed, and patient medications and                            allergies were reviewed. The patient's tolerance of                            previous anesthesia was also reviewed. The risks                            and benefits of the procedure and the sedation                            options and risks were discussed with the patient.                            All  questions were answered, and informed consent                            was obtained. Prior Anticoagulants: The patient has                            taken no previous anticoagulant or antiplatelet                            agents. ASA Grade Assessment: II - A patient with                            mild systemic disease. After reviewing the risks                             and benefits, the patient was deemed in                            satisfactory condition to undergo the procedure.                           After obtaining informed consent, the colonoscope                            was passed under direct vision. Throughout the                            procedure, the patient's blood pressure, pulse, and                            oxygen saturations were monitored continuously. The                            PCF-H190DL ([id number] Olympus pediatric colonoscope                            was introduced through the anus and advanced to the                            the cecum, identified by appendiceal orifice and                            ileocecal valve. The terminal ileum, ileocecal                            valve, appendiceal orifice, and rectum were                            photographed. The quality of the bowel preparation                            was good. The colonoscopy was performed without  difficulty. The patient tolerated the procedure                            well. The bowel preparation used was MoviPrep via                            split dose instruction. Scope In: 1:08:20 PM Scope Out: 1:27:34 PM Scope Withdrawal Time: 0 hours 16 minutes 30 seconds  Total Procedure Duration: 0 hours 19 minutes 14 seconds  Findings:      The terminal ileum appeared normal.      The entire colon just above the distal rectum was grossly abnormal       throughout. There was scarring and mucosal bridging as well as       cobblestoning and scattered areas of ulceration. The colon was edematous       throughout. Biopsies were taken with a cold forceps for histology from       the right-left half of the colon. SEE IMAGES.      There was evidence of prior sigmoid colectomy in a few diverticula. The       most distal rectum and retroflexed view of the rectum were unremarkable. Impression:               1. Grossly  abnormal colon throughout (with rectal                            sparing) as described. SEE IMAGES. I would favor                            chronic Crohn's colitis. Multiple biopsies obtained                           2. Normal terminal ileum                           3. Rectal sparing                           4. 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 Recent comprehensive GI pathogen panel on stool                            was negative. Repeat C. difficile also negative.                           6. Profound hypoalbuminemia                           7. Profound anemia                           8. Dehydration with low blood pressure and                            electrolyte imbalance Recommendation:  1. Patient was given albumin preprocedure due to                            low blood pressure                           2. The patient to be transfused for hematocrit of                            18. 2 units ordered                           3. Initiate Solu-Medrol 40 mg IV every 12 hours                           4. Follow-up pathology                           5. Diet as tolerated                           6. We will continue to follow                           7. EGD today. Please see report. Procedure Code(s):        --- Professional ---                           [phone], Colonoscopy, flexible; with biopsy, single                            or multiple Diagnosis Code(s):        --- Professional ---                           K63.89, Other specified diseases of intestine                           R19.7, Diarrhea, unspecified                           R63.4, Abnormal weight loss                           R93.3, Abnormal findings on diagnostic imaging of                            other parts of digestive tract CPT copyright 2019 [employer]. All rights reserved. The codes documented in this report are preliminary and  upon coder review may  be revised to meet current compliance requirements. Docia Chuck. Henrene Pastor, MD 10/21/2019 1:50:23 PM This report has been signed electronically. Number of Addenda: 0

## 2019-10-21 NOTE — Progress Notes (Addendum)
Patient ID: CINDI GHAZARIAN, female   DOB: 06-30-1965, 54 y.o.   MRN: [id number]      Progress Note   Subjective   Day # 3  CC; severe weakness, fatigue, weight loss, diarrhea, hyponatremia and severe hypoalbuminemia.  Recent CT showing diffuse colitis.  BMET pending this am  Patient had been scheduled for EGD and colonoscopy tomorrow, with plans for prep later today.  She was inadvertently given the prep last evening, however she completed the prep, tolerated well and stools were clear.  I spoke with her per phone this a.m., she is feeling okay, says she cannot do the prep again.  Explained that we could get her procedures done today and she is in full agreement.   Objective   Vital signs in last 24 hours: Temp:  [98.5 F (36.9 C)] 98.5 F (36.9 C) (07/21 0518) Pulse Rate:  [84-104] 86 (07/21 0518) Resp:  [18] 18 (07/21 0518) BP: (81-92)/(58-62) 81/58 (07/21 0518) SpO2:  [93 %-100 %] 100 % (07/21 0518) Last BM Date: 10/21/19   Intake/Output from previous day: 07/20 0701 - 07/21 0700 In: 428.7 [I.V.:428.7] Out: -  Intake/Output this shift: No intake/output data recorded.  Lab Results: Recent Labs    10/18/19 1649 10/19/19 0308  WBC 8.6 5.1  HGB 10.4* 7.9*  HCT 30.9* 24.4*  PLT 593* 395   BMET Recent Labs    10/20/19 0338 10/20/19 0928 10/20/19 1850  NA 127* 128* 130*  K 3.8 3.6 3.3*  CL 95* 96* 100  CO2 25 26 23   GLUCOSE 92 96 105*  BUN <5* 5* 5*  CREATININE 0.47 0.48 0.45  CALCIUM 6.7* 6.7* 6.8*   LFT Recent Labs    10/18/19 2116 10/18/19 2116 10/19/19 0308  PROT 4.4*   < > 3.7*  ALBUMIN 1.0*   < > <1.0*  AST 15   < > 13*  ALT 14   < > 11  ALKPHOS 91   < > 81  BILITOT 1.3*   < > 1.4*  BILIDIR 0.3*  --   --   IBILI 1.0*  --   --    < > = values in this interval not displayed.   PT/INR No results for input(s): LABPROT, INR in the last 72 hours.  Studies/Results: VAS [place] LOWER EXTREMITY VENOUS (DVT)  Result Date: 10/19/2019  Lower Venous  DVTStudy Indications: Edema.  Risk Factors: None identified. Comparison Study: No prior studies. Performing Technologist: Oliver Hum RVT  Examination Guidelines: A complete evaluation includes B-mode imaging, spectral Doppler, color Doppler, and power Doppler as needed of all accessible portions of each vessel. Bilateral testing is considered an integral part of a complete examination. Limited examinations for reoccurring indications may be performed as noted. The reflux portion of the exam is performed with the patient in reverse Trendelenburg.  +---------+---------------+---------+-----------+----------+--------------+ RIGHT    CompressibilityPhasicitySpontaneityPropertiesThrombus Aging +---------+---------------+---------+-----------+----------+--------------+ CFV      Full           Yes      Yes                                 +---------+---------------+---------+-----------+----------+--------------+ SFJ      Full                                                        +---------+---------------+---------+-----------+----------+--------------+  FV Prox  Full                                                        +---------+---------------+---------+-----------+----------+--------------+ FV Mid   Full                                                        +---------+---------------+---------+-----------+----------+--------------+ FV DistalFull                                                        +---------+---------------+---------+-----------+----------+--------------+ PFV      Full                                                        +---------+---------------+---------+-----------+----------+--------------+ POP      Full           Yes      Yes                                 +---------+---------------+---------+-----------+----------+--------------+ PTV      Full                                                         +---------+---------------+---------+-----------+----------+--------------+ PERO     Full                                                        +---------+---------------+---------+-----------+----------+--------------+   +---------+---------------+---------+-----------+----------+--------------+ LEFT     CompressibilityPhasicitySpontaneityPropertiesThrombus Aging +---------+---------------+---------+-----------+----------+--------------+ CFV      Full           Yes      Yes                                 +---------+---------------+---------+-----------+----------+--------------+ SFJ      Full                                                        +---------+---------------+---------+-----------+----------+--------------+ FV Prox  Full                                                        +---------+---------------+---------+-----------+----------+--------------+  FV Mid   Full                                                        +---------+---------------+---------+-----------+----------+--------------+ FV DistalFull                                                        +---------+---------------+---------+-----------+----------+--------------+ PFV      Full                                                        +---------+---------------+---------+-----------+----------+--------------+ POP      Full           Yes      Yes                                 +---------+---------------+---------+-----------+----------+--------------+ PTV      Full                                                        +---------+---------------+---------+-----------+----------+--------------+ PERO     Full                                                        +---------+---------------+---------+-----------+----------+--------------+     Summary: RIGHT: - There is no evidence of deep vein thrombosis in the lower extremity.  - No cystic structure found in  the popliteal fossa.  LEFT: - There is no evidence of deep vein thrombosis in the lower extremity.  - No cystic structure found in the popliteal fossa.  *See table(s) above for measurements and observations. Electronically signed by Monica Martinez MD on 10/19/2019 at 2:50:20 PM.    Final        Assessment / Plan:    #55 54 year old female with 109-monthhistory of illness associated with significant fatigue, weakness, lack of appetite, weight loss of 15 pounds, generalized abdominal discomfort who on admission was found to be hypotensive, with severe hypoalbuminemia and hyponatremia.  Symptomatically she has improved over the past couple of days with supportive management, hydration and electrolyte correction, and had been started on low-dose midodrine.  Recent CT about 2 weeks ago at time of initial admission had shown a diffuse colitis, etiology unclear.  She has also been found to have markedly elevated fecal calprotectin and has elevated sed rate and CRP.  Infectious work-up/stool studies negative.  Rule out IBD/lymphocytic colitis/versus other protein-losing colopathy or enteropathy, underlying malignancy, consider amyloidosis.  #2 history of diverticulitis status post sigmoid colectomy 2013 and ventral hernia repair 2019  #3 macrocytic anemia-hemoglobin drifting with volume replacement  Plan; will  proceed with colonoscopy and EGD with gastric and small bowel biopsies today per Dr. Henrene Pastor.  I discussed plan with the patient per phone this morning and she is in full agreement with proceeding today. Hold Lovenox this morning Further recommendations pending findings at endoscopic evaluation.   Principal Problem:   Colitis Active Problems:   Hyponatremia   Severe protein-calorie malnutrition (HCC)   Hypotension due to hypovolemia     LOS: 2 days   Amy Esterwood PA-C 10/21/2019, 9:23 AM  GI ATTENDING  Interval history data reviewed.  Patient personally seen and examined.  Now on  endoscopy for her procedures.  May have protein-losing enteropathy.  Potential diagnostic considerations include eosinophilic gastroenteritis, intestinal lymphangiectasia, collagenous colitis, IBD, and amyloidosis.  Plans for colonoscopy and upper endoscopy with pan biopsies.The nature of the procedure, as well as the risks, benefits, and alternatives were carefully and thoroughly reviewed with the patient. Ample time for discussion and questions allowed. The patient understood, was satisfied, and agreed to proceed.  Docia Chuck. Geri Seminole., M.D. [facility] Division of Gastroenterology

## 2019-10-21 NOTE — Anesthesia Procedure Notes (Signed)
Arterial Line Insertion Start/End7/21/2021 12:00 PM, 10/21/2019 12:15 PM Performed by: Roberts Gaudy, MD, anesthesiologist  Preanesthetic checklist: patient identified, IV checked, site marked, risks and benefits discussed, surgical consent, monitors and equipment checked, pre-op evaluation and timeout performed Left, femoral was placed Catheter size: 20 G Hand hygiene performed , maximum sterile barriers used  and Seldinger technique used  Attempts: 2 Procedure performed without using ultrasound guided technique. Following insertion, dressing applied and Biopatch. Patient tolerated the procedure well with no immediate complications. Additional procedure comments: Difficult arterial line insertion. Attempts made at bilateral radials and bilateral brachial arteries without success. Left femoral art line inserted with ultrasound guidance.Marland Kitchen

## 2019-10-21 NOTE — Progress Notes (Signed)
Spoke with Dr. Conrad Okaton, MDA. Ok with removal of a-line. Spoke with Dr. Allyson Sabal also ok with removal. ENDO RN will remove.

## 2019-10-21 NOTE — Progress Notes (Addendum)
Subjective: Examined patient at bedside.  States that she is feeling fine, just tired because it has been a long day. Reports feeling cold from the blood transfusion. Asking for blankets.   Husband in room, asked about progression of Crohn's disease. Discussed colonoscopy findings and discussed initiation of steroid therapy. Answered as many questions as I could and informed them to talk to GI for a more in-depth outlook on Crohn's disease. They agreed with plan.  Objective:  Vital signs in last 24 hours: Vitals:   10/20/19 2300 10/21/19 0518 10/21/19 0951 10/21/19 1022  BP:  (!) 81/58 (S) (!) 82/47 (!) 87/43  Pulse: 98 86 89 88  Resp:  _0 Temp:  98.5 F (36.9 C) 97.7 F (36.5 C)   TempSrc:  Oral Axillary   SpO2:  100% 99% 98%  Weight:      Height:       Physical Exam: General: Middle-aged female, lying in bed, NAD. CV: Regular rate and normal rhythm. No m/r/g Pulm: CTABL, no adventitious sounds noted. Abdomen: Soft, non-distended, non-tender. +BS in all quadrants.  Assessment/Plan:  Principal Problem:   Colitis Active Problems:   Hyponatremia   Severe protein-calorie malnutrition (HCC)   Hypotension due to hypovolemia  Patient is a 54 year old female with history of hypertension, diverticulitis status post sigmoid colectomy in 2015, ventral hernia status post repair, prior episode of colitis in her teens who was admitted for abd discomfort, n/v, weight loss, hypotension, and electrolyte derangements who presented with bilateral LE swelling and continued nausea, vomiting, diarrhea, weight loss, and generalized fatigue. Noted to have significant electrolyte abnormalities and hypotension. Colonoscopy suggestive of Crohn's disease.   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 Stool studies were performed which revealed a very high calprotectin, negative GIP panel and negative TTG and IgA.  GI was planning on inpatient colonoscopy during her prior hospitalization however patient left AMA before this could be done. She did have a colonoscopy scheduled early next month however developed bilateral lower extremity swelling and decided to come in for evaluation. She is agreeable to colonoscopy inpatient now. GI consulted, agree with aggressive resuscitative efforts and correction of electrolyte abnormalities. Colonoscopy and EGD performed on 10/21/19. Colonoscopy suggestive of Crohn's disease, but will follow up with pathology report. EGD shows incidental esophageal ring, will follow up with pathology report. -GI following, appreciate recommendations -CRP 9, ESR 23 -Recent fecal calprotectin markedly elevated --> 17,356 (last admission) -Recent GI path panel negative (last admission) -C diff screen negative -Nutrition consulted -Holding home over-the-counter Imodium A-D and Mylanta -Continue IV fluids -Initiate IV Solumedrol 75m q12h -f/u pathology reports from colonoscopy and EGD   Severe Anemia Macrocytic anemia  Folate normal, B12 elevated. Likely due to her inflammatory colitis.   -On 10/21/19, Hgb 6.1 --> 6.3 -transfused with 2 units of pRBCs  -Continue to monitor  Hypoosmolar hyponatremic hypovolemia Patient's calculated serum osmolarity of 255. Currently asymptomatic, however she requires IVF as she is currently hypotensive. Will continue with IV fluids. -Serum osm 255, urine osm 261, urine sodium <10 -Na 127 --> 128 --> 130 --> 135 -Given 1L bolus NS and 1L infusion at 100cc/hr on 10/20/19 -Monitor BMP   Hypotension Soft pressures since admission. However, patient is awake, alert and is perfusing well. Started midodrine overnight due to very soft BPs, however will hold to determine if fluid administration alone will stabilize pressures as hypotension likely due to chronic hypovolemia. -BPs ranging from 80s /  50s -MAPs ranging in the 60s -Hold midodrine    Hypokalemia Potassium 2.6 (on 10/21/19) -Giving 70mq IV KCl q1h x12 -Giving PO 425m KCl -f/u EKG -f/u potassium on repeat BMP today -continue to monitor  Hypocalcemia Ionized calcium of 1.06 -f/u EKG -Continue to monitor  Hypoalbuminemia likely 2/2 protein-losing enteropathy 2/2 Crohn's disease Peripheral edema Patient's albumin of 1.0 at this admission. Could be due to protein-losing enteropathy 2/2 IBD (Crohn's), as suggested by colonoscopy. Should improve with treatment of Crohn's disease. -Nutrition consulted  -BL LE dopplers negative  Hypophosphatemia Phosphorous of 1.5. Repleted, improved to 1.9. -f/u phosphorus lab   Thrombocytosis PLT 593 at admission. Likely reactive in the setting of inflammatory colitis. -PLT 362 now. No further need to follow.   Prior to Admission Living Arrangement: Home Anticipated Discharge Location: TBD, likely home Barriers to Discharge: Continued medical management Dispo: Anticipated discharge in approximately 2-3.   JiVirl AxeMD 10/21/2019, 1:29 PM Pager: [phone] 5pm on weekdays and 1pm on weekends: On Call pager [phone]

## 2019-10-21 NOTE — Progress Notes (Signed)
Called patient's husband, Wonder Donaway, in order to obtain permission for blood transfusion. Husband gives permission to proceed with transfusion.

## 2019-10-21 NOTE — Anesthesia Postprocedure Evaluation (Signed)
Anesthesia Post Note  Patient: Natasha Barrera  Procedure(s) Performed: COLONOSCOPY WITH PROPOFOL (N/A ) ESOPHAGOGASTRODUODENOSCOPY (EGD) WITH PROPOFOL (N/A ) BIOPSY     Patient location during evaluation: Endoscopy Anesthesia Type: MAC Level of consciousness: awake and alert Pain management: pain level controlled Vital Signs Assessment: post-procedure vital signs reviewed and stable Respiratory status: spontaneous breathing, nonlabored ventilation, respiratory function stable and patient connected to nasal cannula oxygen Cardiovascular status: stable and blood pressure returned to baseline Postop Assessment: no apparent nausea or vomiting Anesthetic complications: no   No complications documented.  Last Vitals:  Vitals:   10/21/19 1450 10/21/19 1500  BP: (!) 94/51 (!) 89/57  Pulse: 80 72  Resp: 18 (!) 21  Temp:    SpO2: 99% 100%    Last Pain:  Vitals:   10/21/19 1450  TempSrc:   PainSc: 0-No pain                 [name]

## 2019-10-21 NOTE — Transfer of Care (Signed)
Immediate Anesthesia Transfer of Care Note  Patient: Natasha Barrera  Procedure(s) Performed: COLONOSCOPY WITH PROPOFOL (N/A ) ESOPHAGOGASTRODUODENOSCOPY (EGD) WITH PROPOFOL (N/A ) BIOPSY  Patient Location: Endoscopy Unit  Anesthesia Type:MAC  Level of Consciousness: awake  Airway & Oxygen Therapy: Patient Spontanous Breathing  Post-op Assessment: Report given to RN  Post vital signs: Reviewed and stable  Last Vitals:  Vitals Value Taken Time  BP    Temp    Pulse    Resp    SpO2      Last Pain:  Vitals:   10/21/19 1022  TempSrc:   PainSc: 0-No pain         Complications: No complications documented.

## 2019-10-21 NOTE — Progress Notes (Addendum)
Per Dr. Linna Caprice, patient needs to receive albumin with an increase in BP before we start her case. Albumin running now.

## 2019-10-22 DIAGNOSIS — D500 Iron deficiency anemia secondary to blood loss (chronic): Secondary | ICD-10-CM

## 2019-10-22 LAB — BPAM RBC
Blood Product Expiration Date: 202108212359
Blood Product Expiration Date: 202108212359
ISSUE DATE / TIME: 202107211414
ISSUE DATE / TIME: 202107211651
Unit Type and Rh: 5100
Unit Type and Rh: 5100

## 2019-10-22 LAB — COMPREHENSIVE METABOLIC PANEL
ALT: 16 U/L (ref 0–44)
AST: 18 U/L (ref 15–41)
Albumin: 1.5 g/dL — ABNORMAL LOW (ref 3.5–5.0)
Alkaline Phosphatase: 88 U/L (ref 38–126)
Anion gap: 7 (ref 5–15)
BUN: 7 mg/dL (ref 6–20)
CO2: 20 mmol/L — ABNORMAL LOW (ref 22–32)
Calcium: 7.5 mg/dL — ABNORMAL LOW (ref 8.9–10.3)
Chloride: 103 mmol/L (ref 98–111)
Creatinine, Ser: 0.46 mg/dL (ref 0.44–1.00)
GFR calc Af Amer: >60 mL/min (ref >60)
GFR calc non Af Amer: >60 mL/min (ref >60)
Glucose, Bld: 115 mg/dL — ABNORMAL HIGH (ref 70–99)
Potassium: 4.6 mmol/L (ref 3.5–5.1)
Sodium: 130 mmol/L — ABNORMAL LOW (ref 135–145)
Total Bilirubin: 1 mg/dL (ref 0.3–1.2)
Total Protein: 4.4 g/dL — ABNORMAL LOW (ref 6.5–8.1)

## 2019-10-22 LAB — TYPE AND SCREEN
ABO/RH(D): O POS
Antibody Screen: NEGATIVE
Unit division: 0
Unit division: 0

## 2019-10-22 LAB — CBC
HCT: 32 % — ABNORMAL LOW (ref 36.0–46.0)
Hemoglobin: 10.7 g/dL — ABNORMAL LOW (ref 12.0–15.0)
MCH: 31.8 pg (ref 26.0–34.0)
MCHC: 33.4 g/dL (ref 30.0–36.0)
MCV: 95 fL (ref 80.0–100.0)
Platelets: 302 10*3/uL (ref 150–400)
RBC: 3.37 MIL/uL — ABNORMAL LOW (ref 3.87–5.11)
RDW: 19.5 % — ABNORMAL HIGH (ref 11.5–15.5)
WBC: 8 10*3/uL (ref 4.0–10.5)
nRBC: 0 % (ref 0.0–0.2)

## 2019-10-22 LAB — POCT I-STAT 7, (LYTES, BLD GAS, ICA,H+H)
Acid-Base Excess: 1 mmol/L (ref 0.0–2.0)
Bicarbonate: 23.6 mmol/L (ref 20.0–28.0)
Calcium, Ion: 1.09 mmol/L — ABNORMAL LOW (ref 1.15–1.40)
HCT: 18 % — ABNORMAL LOW (ref 36.0–46.0)
Hemoglobin: 6.1 g/dL — CL (ref 12.0–15.0)
O2 Saturation: 99 %
Patient temperature: 36.1
Potassium: 3.1 mmol/L — ABNORMAL LOW (ref 3.5–5.1)
Sodium: 135 mmol/L (ref 135–145)
TCO2: 24 mmol/L (ref 22–32)
pCO2 arterial: 27 mmHg — ABNORMAL LOW (ref 32.0–48.0)
pH, Arterial: 7.547 — ABNORMAL HIGH (ref 7.350–7.450)
pO2, Arterial: 106 mmHg (ref 83.0–108.0)

## 2019-10-22 LAB — CBC WITH DIFFERENTIAL/PLATELET
Abs Immature Granulocytes: 0.06 10*3/uL (ref 0.00–0.07)
Basophils Absolute: 0 10*3/uL (ref 0.0–0.1)
Basophils Relative: 1 %
Eosinophils Absolute: 0 10*3/uL (ref 0.0–0.5)
Eosinophils Relative: 0 %
HCT: 30.7 % — ABNORMAL LOW (ref 36.0–46.0)
Hemoglobin: 10.4 g/dL — ABNORMAL LOW (ref 12.0–15.0)
Immature Granulocytes: 1 %
Lymphocytes Relative: 13 %
Lymphs Abs: 0.6 10*3/uL — ABNORMAL LOW (ref 0.7–4.0)
MCH: 32 pg (ref 26.0–34.0)
MCHC: 33.9 g/dL (ref 30.0–36.0)
MCV: 94.5 fL (ref 80.0–100.0)
Monocytes Absolute: 0.1 10*3/uL (ref 0.1–1.0)
Monocytes Relative: 2 %
Neutro Abs: 3.7 10*3/uL (ref 1.7–7.7)
Neutrophils Relative %: 83 %
Platelets: 353 10*3/uL (ref 150–400)
RBC: 3.25 MIL/uL — ABNORMAL LOW (ref 3.87–5.11)
RDW: 19.3 % — ABNORMAL HIGH (ref 11.5–15.5)
WBC: 4.4 10*3/uL (ref 4.0–10.5)
nRBC: 0.5 % — ABNORMAL HIGH (ref 0.0–0.2)

## 2019-10-22 LAB — SURGICAL PATHOLOGY

## 2019-10-22 MED ORDER — SODIUM CHLORIDE 0.9 % IV BOLUS
1000.0000 mL | Freq: Once | INTRAVENOUS | Status: AC
  Administered 2019-10-22: 1000 mL via INTRAVENOUS

## 2019-10-22 MED ORDER — ENSURE ENLIVE PO LIQD
237.0000 mL | Freq: Three times a day (TID) | ORAL | Status: DC
  Administered 2019-10-23: 237 mL via ORAL

## 2019-10-22 MED ORDER — ZOLPIDEM TARTRATE 5 MG PO TABS
5.0000 mg | ORAL_TABLET | Freq: Every evening | ORAL | Status: DC | PRN
  Administered 2019-10-22: 5 mg via ORAL
  Filled 2019-10-22: qty 1

## 2019-10-22 NOTE — Progress Notes (Signed)
Subjective: Examined patient at bedside.  States that she is feeling better, but tired because was not able to sleep well overnight.  Discussed severity of Crohn's disease that was found and that we are treating her with IV steroids. Discussed that we will follow GI recommendations in terms of how long she would need to stay in the hospital for IV steroids and when transition to oral steroids would be possible.  Discussed that potassium and phosphorus have stabilized. Discussed that we will try to advance her diet as tolerated.  No questions at this time.  Objective:  Vital signs in last 24 hours: Vitals:   10/21/19 2017 10/21/19 2332 10/22/19 0321 10/22/19 0427  BP: 104/61 94/65 (!) 93/59   Pulse: 89 71 73   Resp: 16 16    Temp: 98.2 F (36.8 C) 97.7 F (36.5 C)  (!) 97.4 F (36.3 C)  TempSrc: Oral Oral  Oral  SpO2: 100% 96% 96%   Weight:      Height:       Physical Exam: General: Middle-aged female, lying in bed, NAD. CV: Regular rate and normal rhythm. No m/r/g Pulmonary: CTABL, no adventitious sounds noted. Abdomen: Soft, non-distended. Slightly tender diffusely. +BS in all quadrants.  Assessment/Plan:  Principal Problem:   Colitis Active Problems:   Hyponatremia   Severe protein-calorie malnutrition (HCC)   Hypotension due to hypovolemia   Anemia   Weight loss, unintentional   Esophageal ring  Patientis a 54 year old female with history of hypertension, diverticulitis status post sigmoid colectomy in 2015, ventral hernia status post repair, prior episode of colitis in her teens who was admitted for abd discomfort, n/v, weight loss, hypotension, and electrolyte derangements who presented with bilateral LE swelling and continued nausea, vomiting, diarrhea, weight loss, and generalized fatigue. Noted to have significant electrolyte abnormalities and hypotension.Colonoscopy suggestive of Crohn's disease. Will continue monitoring electrolytes. Follow GI recs about  duration of IV Solumedrol and when we can transition to PO prednisone. Will monitor if she can tolerate regular diet with thin fluids, advance as tolerated.  Inflammatory Colitis 2/2 Crohn's Disease Noted during her prior hospitalization,CT Abdomen/Pelvis revealed diffuse colitis. Stool studies were performed which revealed a very high calprotectin, negative GIP panel and negative TTG and IgA.GI was planning on inpatient colonoscopy during her prior hospitalization however patient left AMA before this could be done. She did have a colonoscopy scheduled early next month however developed bilateral lower extremity swelling and decided to come in for evaluation. She is agreeable to colonoscopy inpatient now. GI consulted, agree with aggressive resuscitative efforts and correction of electrolyte abnormalities. Colonoscopy and EGD performed on 10/21/19. Colonoscopy suggestive of Crohn's disease, but will follow up with pathology report. EGD shows incidental esophageal ring, will follow up with pathology report. -GIfollowing, appreciate recommendations -CRP 9, ESR 23 -Recent fecal calprotectin markedly elevated --> 17,356 (last admission) -Nutrition consulted, on regular diet with thin liquids -Holding home over-the-counter Imodium A-D and Mylanta -Continue IV fluids -Initiate IV Solumedrol 57m q12h -f/u pathology reports from colonoscopy and EGD -started ambien 541mqhs for trouble sleeping (likely due to IV steroids)  Severe Anemia Likely due to her Crohn's colitis.   -Hgb 6.3 --> 10.4 after 2 units pRBCs -f/u CBC  Hypoosmolar hyponatremic hypovolemia Patient's calculated serum osmolarity of 255. Currently asymptomatic, however she requires IVF as she is currently hypotensive. Will continue with IV fluids. -Serum osm 255, urine osm 261, urine sodium <10 -Na 130 --> 135 --> 132 -Given 1L bolus NS on 10/22/19 -  Monitor BMP  Hypotension Soft pressures since admission. However, patient is  awake, alert and is perfusing well.Started midodrine overnight due to very soft BPs, however will hold to determine if fluid administration alone will stabilize pressures as hypotension likely due to chronic hypovolemia. 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 Should improve with treatment of Crohn's disease. -Nutrition consulted  -BL LE dopplersnegative  Hypophosphatemia Phosphorous 1.9 --> 4 after repletion   Prior to Admission Living Arrangement: Home Anticipated Discharge Location: TBD Barriers to Discharge: Continued medical management Dispo: Anticipated discharge in approximately 3-4 day(s).   Virl Axe, MD 10/22/2019, 6:47 AM Pager: [phone] After 5pm on weekdays and 1pm on weekends: On Call pager [phone]

## 2019-10-22 NOTE — Progress Notes (Addendum)
Daily Rounding Note  10/22/2019, 10:02 AM  LOS: 3 days   SUBJECTIVE:   Chief complaint:     Still with diffuse edema, some weeping of liquid from puncture sites.  Tolerating regular diet but pt wonders why so many foods taste peppery and hot (sausage this AM for example) Small, watery stools.   BPs hypotensive in 90s/50s, not tachy, good sats feelilng better after good night's rest.    OBJECTIVE:         Vital signs in last 24 hours:    Temp:  [97.4 F (36.3 C)-98.8 F (37.1 C)] 97.7 F (36.5 C) (07/22 0754) Pulse Rate:  [71-91] 83 (07/22 0754) Resp:  [13-23] 18 (07/22 0754) BP: (83-122)/(40-75) 93/57 (07/22 0754) SpO2:  [95 %-100 %] 95 % (07/22 0754) Arterial Line BP: (105-117)/(41-56) 105/41 (07/21 1445) Last BM Date: 10/21/19 Filed Weights   10/18/19 1620  Weight: 53.5 kg   General: pale, comfortable, alert, not acutely ill looking but appears a bit tired   Heart: RRR Chest: clear bil.   Abdomen: soft, NT, ND.    Extremities: anasarca Neuro/Psych:  Oriented x 3.  No gross weakness, or deficits.  No tremors.    Intake/Output from previous day: 07/21 0701 - 07/22 0700 In: 1329.7 [I.V.:700; Blood:629.7] Out: -   Intake/Output this shift: No intake/output data recorded.  Lab Results: Recent Labs    10/21/19 1240 10/21/19 1257 10/21/19 2204  WBC  --  4.2 4.4  HGB 6.1* 6.3* 10.4*  HCT 18.0* 19.5* 30.7*  PLT  --  362 353   BMET Recent Labs    10/20/19 0928 10/20/19 0928 10/20/19 1850 10/21/19 1240 10/21/19 2204  NA 128*   < > 130* 135 132*  K 3.6   < > 3.3* 2.6* 3.8  CL 96*   < > 100 98 101  CO2 26  --  23  --  22  GLUCOSE 96   < > 105* 87 114*  BUN 5*   < > 5* 5* 6  CREATININE 0.48   < > 0.45 <0.20* 0.43*  CALCIUM 6.7*  --  6.8*  --  7.3*   < > = values in this interval not displayed.   LFT No results for input(s): PROT, ALBUMIN, AST, ALT, ALKPHOS, BILITOT, BILIDIR, IBILI in the last 72  hours. PT/INR No results for input(s): LABPROT, INR in the last 72 hours. Hepatitis Panel No results for input(s): HEPBSAG, HCVAB, HEPAIGM, HEPBIGM in the last 72 hours.  Studies/Results: No results found.  ASSESMENT:   *  Diarrhea, anemia, wt loss, ? Protein depleting enteropathy? C diff, stools studies wnl.  TTG, IgA normal.  Elevated fecal calprotectin. Elevated CRP.   Diffuse ascending, descending colitis, prominent RP nodes per CTAP 10/21/19 EGD: distal esophageal ring, O/w normal.  bxs of duodenum obtained, pndg.  10/21/19 Colonoscopy: scarring, bridging thruout colon w rectal sparing, grossly favoring Crohn's dz.  TI normal.  Biopsies obtained.   Day 2 IV solumedrol 40 mg q 12.    Hgb 6.1 >> 2 PRBC >> 10.4.  Elevated MCV, normal B12 and Folate.    *   Sigmoid colectomy for diverticulitis 2013.  vewntral hernia repair 2019.    *   Protein cal malnutrition, severe.  Albumin <1.    *   Hyponatremia, improved.  Hypokalemia resolved  PLAN   *   Await path from EGD and colonoscopy.  Check pre-albumin  *  RD  saw pt 7/20, suggest RD return to provide education re best  dietary choices.    Azucena Freed  10/22/2019, 10:02 AM Phone [phone]  GI ATTENDING  Interval history data reviewed.  Patient seen and examined.  Agree with comprehensive interval progress note as outlined above.  Patient is feeling a little better.  Started on IV Solu-Medrol for probable Crohn's disease.  Await pathology.  Hemoglobin improved after transfusion.  Continue with supportive care and close monitoring.  Will follow.  Docia Chuck. Geri Seminole., M.D. [facility] Division of Gastroenterology

## 2019-10-22 NOTE — Progress Notes (Signed)
Nutrition Follow-up  DOCUMENTATION CODES:   Non-severe (moderate) malnutrition in context of chronic illness  INTERVENTION:  Provide Ensure Enlive po TID, each supplement provides 350 kcal and 20 grams of protein.  Encourage adequate PO intake.   NUTRITION DIAGNOSIS:   Moderate Malnutrition related to chronic illness (crohn's disease) as evidenced by moderate muscle depletion, moderate fat depletion; ongoing  GOAL:   Patient will meet greater than or equal to 90% of their needs; progressing  MONITOR:   PO intake, Supplement acceptance, Skin, Weight trends, Labs, I & O's  REASON FOR ASSESSMENT:   Malnutrition Screening Tool, Consult Assessment of nutrition requirement/status  ASSESSMENT:   [name] is a 54 year old female with past medical history of HTN, diverticulitis s/p sigmoid colectomy in 2013, ventral hernia (repaired), and prior episode of colitis, and a recent hospitalization for diarrhea, hypotension and electrolyte derangements secondary to colitis who returned nine days after leaving AMA found to have continued electrolyte derangements with new onset peripheral edema. Pt with Inflammatory Colitis secondary to Crohn's Disease  Pt is currently on a regular diet with thin liquids. Meal completion has been 20-50%. Pt reports she has been working on her po intake at meals. Pt reports she was eating better at home and usually consumes 3 meals a day. Pt currently has Boost Breeze and Prosource plus ordered, however reports she favors Ensure better. RD to modify supplement orders. Diet handout "Crohn's Disease Nutrition Therapy" from the Academy of nutrition and Dietetics Manual was given. Discussed foods recommended and not recommended. Pt verbalized understanding of information provided. Pt encouraged to eat her food at meals and to drink her supplements.   NUTRITION - FOCUSED PHYSICAL EXAM:    Most Recent Value  Orbital Region Moderate depletion  Upper Arm  Region No depletion  Thoracic and Lumbar Region No depletion  Buccal Region Moderate depletion  Temple Region Moderate depletion  Clavicle Bone Region Moderate depletion  Clavicle and Acromion Bone Region Moderate depletion  Scapular Bone Region Unable to assess  Dorsal Hand Unable to assess  Patellar Region No depletion  Anterior Thigh Region No depletion  Posterior Calf Region No depletion  Edema (RD Assessment) Moderate  Hair Reviewed  Eyes Reviewed  Mouth Reviewed  Skin Reviewed  Nails Reviewed     Labs and medications reviewed.   Diet Order:   Diet Order            Diet regular Room service appropriate? Yes; Fluid consistency: Thin  Diet effective now                 EDUCATION NEEDS:   No education needs have been identified at this time  Skin:  Skin Assessment: Reviewed RN Assessment  Last BM:  10/20/19  Height:   Ht Readings from Last 1 Encounters:  10/18/19 5' 4"  (1.626 m)    Weight:   Wt Readings from Last 1 Encounters:  10/18/19 53.5 kg    Ideal Body Weight:  54.5 kg  BMI:  Body mass index is 20.25 kg/m.  Estimated Nutritional Needs:   Kcal:  1700-1900  Protein:  90-105 grams  Fluid:  > 1.7 L  Corrin Parker, MS, RD, LDN RD pager number/after hours weekend pager number on Amion.

## 2019-10-23 ENCOUNTER — Inpatient Hospital Stay (HOSPITAL_COMMUNITY): Payer: 59

## 2019-10-23 LAB — PREALBUMIN: Prealbumin: <5 mg/dL — ABNORMAL LOW (ref 18–38)

## 2019-10-23 LAB — LACTIC ACID, PLASMA: Lactic Acid, Venous: 2.2 mmol/L (ref 0.5–1.9)

## 2019-10-23 LAB — TROPONIN I (HIGH SENSITIVITY): Troponin I (High Sensitivity): 4 ng/L (ref <18)

## 2019-10-23 MED ORDER — IOHEXOL 350 MG/ML SOLN
100.0000 mL | Freq: Once | INTRAVENOUS | Status: AC | PRN
  Administered 2019-10-23: 100 mL via INTRAVENOUS

## 2019-10-23 MED ORDER — IOHEXOL 9 MG/ML PO SOLN
ORAL | Status: AC
  Administered 2019-10-23: 500 mL
  Filled 2019-10-23: qty 500

## 2019-10-23 MED ORDER — PIPERACILLIN-TAZOBACTAM 4.5 G IVPB
4.5000 g | Freq: Four times a day (QID) | INTRAVENOUS | Status: DC
  Filled 2019-10-23 (×2): qty 100

## 2019-10-23 MED ORDER — LACTATED RINGERS IV BOLUS
1000.0000 mL | Freq: Once | INTRAVENOUS | Status: AC
  Administered 2019-10-23: 1000 mL via INTRAVENOUS

## 2019-10-23 MED ORDER — ALUM & MAG HYDROXIDE-SIMETH 200-200-20 MG/5ML PO SUSP
30.0000 mL | ORAL | Status: DC | PRN
  Administered 2019-10-23: 30 mL via ORAL
  Filled 2019-10-23: qty 30

## 2019-10-23 MED ORDER — PIPERACILLIN-TAZOBACTAM 3.375 G IVPB 30 MIN
3.3750 g | INTRAVENOUS | Status: AC
  Administered 2019-10-23: 3.375 g via INTRAVENOUS
  Filled 2019-10-23 (×2): qty 50

## 2019-10-23 MED ORDER — PIPERACILLIN-TAZOBACTAM 3.375 G IVPB
3.3750 g | Freq: Three times a day (TID) | INTRAVENOUS | Status: DC
  Administered 2019-10-24 – 2019-10-29 (×17): 3.375 g via INTRAVENOUS
  Filled 2019-10-23 (×15): qty 50

## 2019-10-23 MED ORDER — TRAMADOL HCL 50 MG PO TABS
50.0000 mg | ORAL_TABLET | Freq: Once | ORAL | Status: AC
  Administered 2019-10-23: 50 mg via ORAL
  Filled 2019-10-23: qty 1

## 2019-10-23 NOTE — Progress Notes (Addendum)
Radiology contacted IMTS after CT A/P showed extensive pneumoperitoneum.   Patient evaluated at bedside. She endorses improved abdominal pain but states with palpation, pain is significant. She notes a small loose bowel movement earlier today. She denies any fever, chills, palpitations, chest pain, SOB, nausea, vomiting, abdominal distention.   On examination, patient is hemodynamically stable with temp of 97.4, HR of 109, BP 104/65, spO2 of 100%. Abdominal exam with active bowel sounds, significant tenderness on palpation especially on the LUQ and epigastric region. No distention or rigidity noted.   Assessment/Plan:  [name] is a 54 y/o female currently admitted for newly diagnosed Crohn's disease on IV Solumedrol. PMHx includes diverticulosis s/p sigmoid colectomy (2013).   Earlier this afternoon at approximately 3 pm, patient developed sudden abdominal pain. CT imaging pursued at the time to rule out perforation. Unfortunately, imaging shows a large amount of pneumoperitoneum. General surgery, Dr. Amado Coe, has been consulted and evaluated patient. Plan at this time is to pursue ex lap emergently tonight. Patient expressed understanding and is in agreement with plan.   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 With need for imminent surgery, will need to hold off on any anti-coagulation at this time. Recommend further work up once stabilized.    Husband was contacted x 3 with no answer. Voice message left.   - Lovenox discontinued - NPO - Lactic acid pending - Zosyn per pharmacy - IVF with LR 1 L bolus  - Ex lap per general surgery

## 2019-10-23 NOTE — Progress Notes (Signed)
Pharmacy Antibiotic Note  Natasha Barrera is a 54 y.o. female admitted on 10/18/2019 with intra-abdominal infection.  Pharmacy has been consulted for Zosyn dosing.  Plan: Zosyn 3.375g IV q8h (4-hour infusion).  Height: 5' 4"  (162.6 cm) Weight: 53.5 kg (118 lb) IBW/kg (Calculated) : 54.7  Temp (24hrs), Avg:97.7 F (36.5 C), Min:97.4 F (36.3 C), Max:98.1 F (36.7 C)  Recent Labs  Lab 10/18/19 1649 10/18/19 1649 10/19/19 0308 10/19/19 0830 10/20/19 0928 10/20/19 1850 10/21/19 1240 10/21/19 1257 10/21/19 2204 10/22/19 1949  WBC 8.6  --  5.1  --   --   --   --  4.2 4.4 8.0  CREATININE 0.53   < > 0.40*   < > 0.48 0.45 <0.20*  --  0.43* 0.46   < > = values in this interval not displayed.    Estimated Creatinine Clearance: 67.9 mL/min (by C-G formula based on SCr of 0.46 mg/dL).    No Known Allergies   Thank you for allowing pharmacy to be a part of this patient's care.  Wynona Neat, PharmD, BCPS  10/23/2019 11:04 PM

## 2019-10-23 NOTE — Progress Notes (Addendum)
Pt apparently complained of cp prior to shift change. EKG done by nightshift RN. Pt now stating 7.5/10 upper abdominal pain with difficulty breathing. Says she feels like "someone kicked her in the stomach." Dr. Henrene Pastor has been made aware of both and agrees to send his PA to check her out and requests hospitalist to be made aware as well.  0840: Dr. Daryll Drown with teaching service has been paged.  1150: GI MD presented to bedside and gave orders for PRN Mylanta. PRN Tylenol given as well. Teaching services has been secure chatted, as have not heard back from pages. 1455: Pt now complaining of 9.5/10 upper abdominal pain. Communicated this to teaching services. Orders for Tramadol, repeat EKG, troponins given. Request for abdominal CT, with orders given for bedside KUB. Will continue to monitor.   Justice Rocher, RN

## 2019-10-23 NOTE — Progress Notes (Addendum)
Daily Rounding Note  10/23/2019, 9:34 AM  LOS: 4 days   SUBJECTIVE:   Chief complaint: Colitis  Appetite remains poor. Hypotension persists.intermittently worse pain in left abdomen.  No BM's yest, 2 loose BM today.     Overnight c/o chest pressure at rest, feels like she needs to belch.  No nausea.  Does not feel like heart burn.  Pain is not radiating to neck or arm.  EKG performed with no overt ischemia, no Troponins ordered.  RN has not heard back from pages to [employer].     OBJECTIVE:         Vital signs in last 24 hours:    Temp:  [97.7 F (36.5 C)-98.1 F (36.7 C)] 97.7 F (36.5 C) (07/23 0812) Pulse Rate:  [76-97] 94 (07/23 0812) Resp:  [16-23] 20 (07/23 0812) BP: (95-115)/(60-82) 97/64 (07/23 0812) SpO2:  [94 %-98 %] 98 % (07/23 0812) Last BM Date: 10/22/19 Filed Weights   10/18/19 1620  Weight: 53.5 kg   General: pale, chronically ill looking.  Weak: looks the same as yesterday   Heart: RRR Chest: clear bil.  No dyspnea or cough Abdomen: soft, ND.  Moderated bil tenderness w/o G/R.  Active BS  Extremities: + pitting LE edema Neuro/Psych:  oriented x 3.  Generally but not focally weak.    Intake/Output from previous day: 07/22 0701 - 07/23 0700 In: 240 [P.O.:240] Out: -   Intake/Output this shift: No intake/output data recorded.  Lab Results: Recent Labs    10/21/19 1257 10/21/19 2204 10/22/19 1949  WBC 4.2 4.4 8.0  HGB 6.3* 10.4* 10.7*  HCT 19.5* 30.7* 32.0*  PLT 362 353 302   BMET Recent Labs    10/20/19 1850 10/20/19 1850 10/21/19 1240 10/21/19 1240 10/21/19 1247 10/21/19 2204 10/22/19 1949  NA 130*   < > 135   < > 135 132* 130*  K 3.3*   < > 2.6*   < > 3.1* 3.8 4.6  CL 100   < > 98  --   --  101 103  CO2 23  --   --   --   --  22 20*  GLUCOSE 105*   < > 87  --   --  114* 115*  BUN 5*   < > 5*  --   --  6 7  CREATININE 0.45   < > <0.20*  --   --  0.43* 0.46  CALCIUM 6.8*  --    --   --   --  7.3* 7.5*   < > = values in this interval not displayed.   LFT Recent Labs    10/22/19 1949  PROT 4.4*  ALBUMIN 1.5*  AST 18  ALT 16  ALKPHOS 88  BILITOT 1.0   PT/INR No results for input(s): LABPROT, INR in the last 72 hours. Hepatitis Panel No results for input(s): HEPBSAG, HCVAB, HEPAIGM, HEPBIGM in the last 72 hours.  Studies/Results: No results found.  ASSESMENT:   *  Inflammatory bowel disease, likely Crohn's disease.  Symptoms include diarrhea, anemia, wt loss.  ? Protein depleting enteropathy? C diff, stools studies wnl.  TTG, IgA normal.  Elevated fecal calprotectin. Elevated CRP.   Diffuse ascending, descending colitis, prominent RP nodes per CTAP 10/21/19 EGD: distal esophageal ring, O/w normal.  bxs of duodenum obtained, pndg. Path: No histopathologic changes, no increased intraepithelial lymphocytes or changes of villous architecture. 10/21/19 Colonoscopy: scarring, bridging thruout  colon w rectal sparing, grossly favoring Crohn's dz.  TI normal.  Biopsies obtained.   Day 3 IV solumedrol 40 mg q 12.   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 No granulomas or dysplasia. Abdominal pain improved  *   Chest pressure, unremarkable EKG.  Dose not sound anginal.    *   Anemia.  Hgb 6.1 >> 2 PRBC >> 10.7.  Elevated MCV, normal B12 and Folate.    *   Sigmoid colectomy for diverticulitis 2013.  ventral hernia repair 2019.    *   Protein cal malnutrition, moderate.  Albumin <1. Pre-albumin pndg.  Still anorexic w poor po intake.  TID ensure in place.     *   Hyponatremia, persists.    PLAN   *   Prn Maalox, give now.  RN secure chatted TS attending and resident re further plans re chest pressure.    *  Hep A/B tests, Quantiferon test, TPMT test.  Anticipate possible biologic and possible immunomodulator therapy.  Continue Solumedrol  for now.     Natasha Barrera  10/23/2019, 9:34 AM Phone [phone]  GI ATTENDING  Interval history data reviewed.  Patient seen and examined.  Agree with interval progress note.  Biopsies have returned and are consistent with chronic inflammatory bowel disease.  I favor Crohn's.  Now on steroids.  Continue supportive care.  We will continue to follow  Docia Chuck. Geri Seminole., M.D. [facility] Division of Gastroenterology

## 2019-10-23 NOTE — Progress Notes (Signed)
Pt with BMP to be drawn this am. Lab initially unable to draw and requested RN to draw. Pt has midline and shift manager has verified it's use for lab draws. 3 RNs attempted to draw labs with no blood return. Lab has been re-consulted and agrees to come back by.   Justice Rocher, RN

## 2019-10-23 NOTE — Consult Note (Signed)
Surgical Evaluation Requesting provider: Dr. Jose Persia  Chief Complaint: abdominal pain  HPI: 54yo woman currently hospital day 6 undergoing workup for abdominal pain, n/v, weight loss, hypotension, and noted to have severe anemia, hypotension, electrolyte derangements and severe malnutrition with an albumin of 1.0 on admission,  anasarca. Working dx is a new dx of crohns colitis with protein-losing enteropathy. She had upper and lower endoscopy by Dr. Henrene Pastor 2 days ago. She is on IV solumedrol. This afternoon she experienced acute onset upper abdominal pain and ultimately underwent a CT this evening revealing large amount of pneumoperitoneum. Currently she feels the pain is a bit better. Denies nausea or emesis; reports small bowel movement earlier today no melena or hematochezia. Prior abdominal surgery includes lap sigmoid colectomy ([place] 2013) and lap VHR (15cm ventralex, Marlou Starks Sept 2020).   No Known Allergies  Past Medical History:  Diagnosis Date  . Diverticul disease small and large intestine, no perforati or abscess   . Diverticulitis of colon   . Hypertension     Past Surgical History:  Procedure Laterality Date  . BIOPSY  10/21/2019   Procedure: BIOPSY;  Surgeon: Irene Shipper, MD;  Location: [facility] ENDOSCOPY;  Service: Endoscopy;;  . COLON SURGERY  12/25/2011  . COLONOSCOPY    . COLONOSCOPY WITH PROPOFOL N/A 10/21/2019   Procedure: COLONOSCOPY WITH PROPOFOL;  Surgeon: Irene Shipper, MD;  Location: [facility] ENDOSCOPY;  Service: Endoscopy;  Laterality: N/A;  . ESOPHAGOGASTRODUODENOSCOPY (EGD) WITH PROPOFOL N/A 10/21/2019   Procedure: ESOPHAGOGASTRODUODENOSCOPY (EGD) WITH PROPOFOL;  Surgeon: Irene Shipper, MD;  Location: [facility] ENDOSCOPY;  Service: Endoscopy;  Laterality: N/A;  with small bowel BX's  . [facility]  2002  . VENTRAL HERNIA REPAIR N/A 12/05/2018   Procedure: LAPAROSCOPIC VENTRAL HERNIA REPAIR WITH MESH;  Surgeon: Jovita Kussmaul, MD;  Location: [facility] OR;  Service: General;   Laterality: N/A;    Family History  Problem Relation Age of Onset  . Breast cancer Neg Hx     Social History   Socioeconomic History  . Marital status: Married    Spouse name: Not on file  . Number of children: Not on file  . Years of education: Not on file  . Highest education level: Not on file  Occupational History  . Not on file  Tobacco Use  . Smoking status: Former Research scientist (life sciences)  . Smokeless tobacco: Never Used  Vaping Use  . Vaping Use: Never used  Substance and Sexual Activity  . Alcohol use: Yes    Comment: occasional  . Drug use: No  . Sexual activity: Not on file  Other Topics Concern  . Not on file  Social History Narrative  . Not on file   Social Determinants of Health   Financial Resource Strain:   . Difficulty of Paying Living Expenses:   Food Insecurity:   . Worried About Charity fundraiser in the Last Year:   . Arboriculturist in the Last Year:   Transportation Needs:   . Film/video editor (Medical):   Marland Kitchen Lack of Transportation (Non-Medical):   Physical Activity:   . Days of Exercise per Week:   . Minutes of Exercise per Session:   Stress:   . Feeling of Stress :   Social Connections:   . Frequency of Communication with Friends and Family:   . Frequency of Social Gatherings with Friends and Family:   . Attends Religious Services:   . Active Member of Clubs or Organizations:   .  Attends Archivist Meetings:   Marland Kitchen Marital Status:     No current facility-administered medications on file prior to encounter.   Current Outpatient Medications on File Prior to Encounter  Medication Sig Dispense Refill  . Calcium Carb-Cholecalciferol (CALCIUM+D3 PO) Take 1 tablet by mouth daily.    . Cholecalciferol (VITAMIN D-3) 125 MCG (5000 UT) TABS Take 5,000 Units by mouth daily.    Marland Kitchen loperamide (IMODIUM A-D) 2 MG tablet Take 1 tablet (2 mg total) by mouth 4 (four) times daily as needed for diarrhea or loose stools. 30 tablet 0  . Multiple Vitamin  (MULTIVITAMIN WITH MINERALS) TABS tablet Take 1 tablet by mouth daily. Centrum Silver    . potassium chloride (KLOR-CON) 10 MEQ tablet Take 2 tablets (20 mEq total) by mouth daily. (Patient not taking: Reported on 10/18/2019) 10 tablet 0    Review of Systems: a complete, 10pt review of systems was completed with pertinent positives and negatives as documented in the HPI  Physical Exam: Vitals:   10/23/19 1949 10/23/19 1955  BP: 104/65   Pulse: 79   Resp: 18   Temp:  (!) 97.4 F (36.3 C)  SpO2: 94%    Gen: A&Ox3, no distress, ill-appearing Eyes: lids and conjunctivae normal, no icterus. Pupils equally round and reactive to light.  Neck: supple without mass or thyromegaly Chest: respiratory effort is normal. No crepitus or tenderness on palpation of the chest. Breath sounds equal.  Cardiovascular: RRR with palpable distal pulses, edema of BLE and BUE Gastrointestinal: soft, distended, tender without guarding in LUQ and LLQ. No mass, hepatomegaly or splenomegaly. No hernia. Muscoloskeletal: no clubbing or cyanosis of the fingers.  Strength is symmetrical throughout.  Range of motion of bilateral upper and lower extremities normal without pain, crepitation or contracture. Neuro: cranial nerves grossly intact.  Sensation intact to light touch diffusely. Psych: appropriate mood and affect, normal insight/judgment intact  Skin: warm and dry   CBC Latest Ref Rng & Units 10/22/2019 10/21/2019 10/21/2019  WBC 4.0 - 10.5 K/uL 8.0 4.4 4.2  Hemoglobin 12.0 - 15.0 g/dL 10.7(L) 10.4(L) 6.3(LL)  Hematocrit 36 - 46 % 32.0(L) 30.7(L) 19.5(L)  Platelets 150 - 400 K/uL 302 353 362    CMP Latest Ref Rng & Units 10/22/2019 10/21/2019 10/21/2019  Glucose 70 - 99 mg/dL 115(H) 114(H) -  BUN 6 - 20 mg/dL 7 6 -  Creatinine 0.44 - 1.00 mg/dL 0.46 0.43(L) -  Sodium 135 - 145 mmol/L 130(L) 132(L) 135  Potassium 3.5 - 5.1 mmol/L 4.6 3.8 3.1(L)  Chloride 98 - 111 mmol/L 103 101 -  CO2 22 - 32 mmol/L 20(L) 22 -   Calcium 8.9 - 10.3 mg/dL 7.5(L) 7.3(L) -  Total Protein 6.5 - 8.1 g/dL 4.4(L) - -  Total Bilirubin 0.3 - 1.2 mg/dL 1.0 - -  Alkaline Phos 38 - 126 U/L 88 - -  AST 15 - 41 U/L 18 - -  ALT 0 - 44 U/L 16 - -    Lab Results  Component Value Date   INR 1.13 10/25/2011    Imaging: DG Abd 1 View  Result Date: 10/23/2019 CLINICAL DATA:  Abdominal pain and diarrhea. EXAM: ABDOMEN - 1 VIEW COMPARISON:  None. FINDINGS: A single mildly dilated small bowel is seen overlying the left upper quadrant. There is no evidence of free air. No radio-opaque calculi or other significant radiographic abnormality are seen. IMPRESSION: Single mildly dilated small bowel overlying the left upper quadrant. This may represent focal ileus  or early small bowel obstruction. Correlation with abdomen pelvis CT is recommended. Electronically Signed   By: Virgina Norfolk M.D.   On: 10/23/2019 17:03     A/P: 54yo woman with new dx of crohns colitis c/b protein-losing enteropathy currently on high dose steroids with acute abdominal pain this afternoon and new finding of large volume pneumoperitoneum. Recommend exploratory laparotomy this evening. Discussed with her that she is high risk of perioperative morbidity or mortality due to her pre-operative state of severe malnutrition, steroids and active colitis. Risks include bleeding, infection, pain, scarring, bowel injury, need for bowel resection, need for open abdomen and re-exploration, high likelihood of needing an ostomy, high risk of wound healing problems/dehiscence/hernia. There is no suitable alternative to surgery- given current physiologic state and steroids any perforation is unlikely to self-seal. Questions were welcomed and answered. She agrees to proceed.     Patient Active Problem List   Diagnosis Date Noted  . Anemia   . Weight loss, unintentional   . Esophageal ring   . Hypotension due to hypovolemia   . Malnutrition of moderate degree ([place])   . Colitis  10/03/2019  . Diarrhea   . Loss of appetite 10/02/2019  . Abdominal bloating 10/02/2019  . Hyponatremia 10/02/2019  . Tachycardia 10/02/2019  . Loss of weight 10/02/2019  . Hypokalemia 09/19/2019  . Diverticulitis of colon 12/29/2011       Romana Juniper, MD [facility]  See AMION to contact appropriate on-call provider

## 2019-10-23 NOTE — Progress Notes (Signed)
Paged by RN Carole Binning about patient having 9.5/10 abdominal pain. Assessed patient. She reports pain in epigastric region. States that it feels like "rib pain" and that it started up after she took a nap. RN Chrys Racer gave tylenol for pain and it initially helped, but the pain came back afterwards. This pain was not present when we visited her this morning. States that she was able to pass a bowel movement 5 minutes before I came, it was somewhat loose but non-bloody. On palpation of abdomen, she is moderately tender in epigastric area, but no guarding or rigidity. She states that it does not feel like heartburn. She is also feeling chest tightness when she takes deep breaths. My clinical suspicion for ACS is low. She is satting fine so doubtful for PE.  -Ordered high sensitivity trops -repeat EKG -Ordered one time dose of tramadol 96m

## 2019-10-23 NOTE — Progress Notes (Signed)
Subjective: Examined patient at bedside.  Reports sleeping well. Reports having 2 BM that were more solid than she's had in months. Reports stomach pain has improved.  Objective:  Vital signs in last 24 hours: Vitals:   10/22/19 2334 10/23/19 0318 10/23/19 0812 10/23/19 1141  BP: 95/68 115/82 (!) 97/64 117/69  Pulse: 80 76 94 89  Resp: 19 23 20 20   Temp: 97.7 F (36.5 C) 98.1 F (36.7 C) 97.7 F (36.5 C) 97.6 F (36.4 C)  TempSrc: Oral Oral Oral Oral  SpO2: 94% 96% 98% 95%  Weight:      Height:       Physical Exam: General: Middle-aged female, lying in bed, NAD. CV: Regular rate and normal rhythm. No m/r/g Pulmonary: CTABL, no adventitious sounds noted. Abdomen: Soft, non-distended. Tender in epigastric region, otherwise non-tender. +BS in all quadrants.  Assessment/Plan:  Principal Problem:   Colitis Active Problems:   Hyponatremia   Malnutrition of moderate degree (HCC)   Hypotension due to hypovolemia   Anemia   Weight loss, unintentional   Esophageal ring  Patientis a 54 year old female with history of hypertension, diverticulitis status post sigmoid colectomy in 2015, ventral hernia status post repair, prior episode of colitis in her teens who was admitted for abd discomfort, n/v, weight loss, hypotension, and electrolyte derangements who presented with bilateral LE swelling and continued nausea, vomiting, diarrhea, weight loss, and generalized fatigue. Noted to have significant electrolyte abnormalities and hypotension.Colonoscopy suggestive of Crohn's disease. Will continue monitoring electrolytes. Follow GI recs about duration of IV Solumedrol and when we can transition to PO prednisone. Will monitor if she can tolerate regular diet with thin fluids, advance as tolerated.  Inflammatory Colitis 2/2 Crohn's Disease Noted during her prior hospitalization,CT Abdomen/Pelvis revealed diffuse colitis. Stool studies were performed which revealed a very high  calprotectin, negative GIP panel and negative TTG and IgA.GI was planning on inpatient colonoscopy during her prior hospitalization however patient left AMA before this could be done. She did have a colonoscopy scheduled early next month however developed bilateral lower extremity swelling and decided to come in for evaluation. She is agreeable to colonoscopy inpatient now. GI consulted, agree with aggressive resuscitative efforts and correction of electrolyte abnormalities. Colonoscopy and EGD performed on 10/21/19. Colonoscopy suggestive of Crohn's disease. Path report confirmed. -GIfollowing, appreciate recommendations -CRP 9, ESR 23 -Recent fecal calprotectin markedly elevated --> 17,356 (last admission) -Nutrition consulted, on regular diet with thin liquids -Holding home over-the-counter Imodium A-D and Mylanta -patient reports no improvement with Lorrin Mais, states she will try to sleep without it. Will discontinue. -Continue IV fluids -Continue IV Solumedrol 68m q12h -Started maalox, as per GI -f/u TPMT lab --> pretreatment screening for thiopurines (AZA and 6-MP) -f/u TB quantiferon --> screening before possible immunomodulator therapy -f/u Hep A and Hep B screening  Severe Anemia Likely due to her Crohn's colitis. -Hgb 6.3 --> 10.4 after 2 units pRBCs -Hgb 10.4 --> 10.7 (stable) -f/u CBC  Hypoosmolar hyponatremic hypovolemia Patient's calculated serum osmolarity of 255. Currently asymptomatic, however she requires IVF as she is currently hypotensive. Will continue with IV fluids. -Serum osm 255, urine osm 261, urine sodium <10 -Na 130 --> 135 --> 132 --> 130 -Given1L bolus NS on 10/22/19 -Monitor BMP  Hypotension Soft pressures since admission. However, patient is awake, alert and is perfusing well.Started midodrine overnight due to very soft BPs, however will hold to determine if fluid administration alone will stabilize pressures as hypotension likely due to chronic  hypovolemia. -BPs in  39R -320E (systolic), and 33I - 35W (diastolic) -MAPs ranging in the 70s - 90s -Hold midodrine  Hypokalemia Potassium 2.6 --> 3.8 after repletion -monitor potassium on BMP  Hypoalbuminemialikely 2/2 protein-losing enteropathy 2/2 Crohn's disease Peripheral edema Patient's albumin of 1.0 at this admission.Could be due to protein-losing enteropathy 2/2 IBD (Crohn's),as suggested by colonoscopy. Should improve with treatment of Crohn's disease. -Nutrition consulted  -BL LE dopplersnegative -Albumin now 1.5  Hypophosphatemia Phosphorous 1.9 --> 4 after repletion  Prior to Admission Living Arrangement: Home Anticipated Discharge Location: TBD Barriers to Discharge: continued medical management Dispo: pending medical workup  Virl Axe, MD 10/23/2019, 1:24 PM Pager: [phone] After 5pm on weekdays and 1pm on weekends: On Call pager [phone]

## 2019-10-24 ENCOUNTER — Inpatient Hospital Stay: Payer: Self-pay

## 2019-10-24 ENCOUNTER — Encounter (HOSPITAL_COMMUNITY)
Admission: EM | Disposition: A | Discharge status: Rehabilitation Facility | Payer: Self-pay | Source: Home / Self Care | Attending: Student in an Organized Health Care Education/Training Program

## 2019-10-24 ENCOUNTER — Inpatient Hospital Stay (HOSPITAL_COMMUNITY): Payer: 59 | Admitting: Anesthesiology

## 2019-10-24 DIAGNOSIS — I959 Hypotension, unspecified: Secondary | ICD-10-CM

## 2019-10-24 DIAGNOSIS — Z933 Colostomy status: Secondary | ICD-10-CM

## 2019-10-24 DIAGNOSIS — K50918 Crohn's disease, unspecified, with other complication: Secondary | ICD-10-CM

## 2019-10-24 DIAGNOSIS — K668 Other specified disorders of peritoneum: Secondary | ICD-10-CM

## 2019-10-24 DIAGNOSIS — Z9049 Acquired absence of other specified parts of digestive tract: Secondary | ICD-10-CM

## 2019-10-24 DIAGNOSIS — K9049 Malabsorption due to intolerance, not elsewhere classified: Secondary | ICD-10-CM

## 2019-10-24 HISTORY — PX: COLOSTOMY: SHX63

## 2019-10-24 HISTORY — PX: LAPAROTOMY: SHX154

## 2019-10-24 LAB — POCT I-STAT, CHEM 8
BUN: 9 mg/dL (ref 6–20)
Calcium, Ion: 1.1 mmol/L — ABNORMAL LOW (ref 1.15–1.40)
Chloride: 98 mmol/L (ref 98–111)
Creatinine, Ser: 0.3 mg/dL — ABNORMAL LOW (ref 0.44–1.00)
Glucose, Bld: 112 mg/dL — ABNORMAL HIGH (ref 70–99)
HCT: 30 % — ABNORMAL LOW (ref 36.0–46.0)
Hemoglobin: 10.2 g/dL — ABNORMAL LOW (ref 12.0–15.0)
Potassium: 4 mmol/L (ref 3.5–5.1)
Sodium: 132 mmol/L — ABNORMAL LOW (ref 135–145)
TCO2: 22 mmol/L (ref 22–32)

## 2019-10-24 LAB — SURGICAL PCR SCREEN
MRSA, PCR: NEGATIVE
Staphylococcus aureus: NEGATIVE

## 2019-10-24 SURGERY — LAPAROTOMY, EXPLORATORY
Anesthesia: General | Site: Abdomen

## 2019-10-24 MED ORDER — SODIUM CHLORIDE 0.9 % IV SOLN
INTRAVENOUS | Status: DC | PRN
Start: 2019-10-24 — End: 2019-10-24

## 2019-10-24 MED ORDER — ACETAMINOPHEN 10 MG/ML IV SOLN
INTRAVENOUS | Status: DC | PRN
  Administered 2019-10-24: 1000 mg via INTRAVENOUS

## 2019-10-24 MED ORDER — LACTATED RINGERS IV SOLN: INTRAVENOUS | Status: AC

## 2019-10-24 MED ORDER — ALBUMIN HUMAN 5 % IV SOLN
12.5000 g | Freq: Once | INTRAVENOUS | Status: AC
  Administered 2019-10-24: 12.5 g via INTRAVENOUS
  Filled 2019-10-24: qty 250

## 2019-10-24 MED ORDER — PROMETHAZINE HCL 25 MG/ML IJ SOLN: 6.2500 mg | INTRAMUSCULAR | Status: DC | PRN

## 2019-10-24 MED ORDER — ACETAMINOPHEN 10 MG/ML IV SOLN
INTRAVENOUS | Status: AC
  Filled 2019-10-24: qty 100

## 2019-10-24 MED ORDER — PROPOFOL 10 MG/ML IV BOLUS
INTRAVENOUS | Status: AC
  Filled 2019-10-24: qty 20

## 2019-10-24 MED ORDER — SUCCINYLCHOLINE CHLORIDE 20 MG/ML IJ SOLN
INTRAMUSCULAR | Status: DC | PRN
  Administered 2019-10-24: 80 mg via INTRAVENOUS

## 2019-10-24 MED ORDER — LACTATED RINGERS IV BOLUS
1000.0000 mL | Freq: Once | INTRAVENOUS | Status: AC
  Administered 2019-10-24: 1000 mL via INTRAVENOUS

## 2019-10-24 MED ORDER — CHLORHEXIDINE GLUCONATE CLOTH 2 % EX PADS
6.0000 | MEDICATED_PAD | Freq: Every day | CUTANEOUS | Status: DC
  Administered 2019-10-27 – 2019-11-22 (×24): 6 via TOPICAL

## 2019-10-24 MED ORDER — MIDAZOLAM HCL 2 MG/2ML IJ SOLN
INTRAMUSCULAR | Status: AC
  Filled 2019-10-24: qty 2

## 2019-10-24 MED ORDER — MIDAZOLAM HCL 2 MG/2ML IJ SOLN
INTRAMUSCULAR | Status: DC | PRN
  Administered 2019-10-24: 1 mg via INTRAVENOUS

## 2019-10-24 MED ORDER — FENTANYL CITRATE (PF) 100 MCG/2ML IJ SOLN: 25.0000 ug | INTRAMUSCULAR | Status: DC | PRN

## 2019-10-24 MED ORDER — NOREPINEPHRINE 4 MG/250ML-% IV SOLN
2.0000 ug/min | INTRAVENOUS | Status: DC
  Administered 2019-10-24: 2 ug/min via INTRAVENOUS
  Administered 2019-10-25: 10 ug/min via INTRAVENOUS
  Filled 2019-10-24 (×3): qty 250

## 2019-10-24 MED ORDER — FENTANYL CITRATE (PF) 250 MCG/5ML IJ SOLN
INTRAMUSCULAR | Status: DC | PRN
  Administered 2019-10-24: 100 ug via INTRAVENOUS
  Administered 2019-10-24 (×4): 50 ug via INTRAVENOUS

## 2019-10-24 MED ORDER — ROCURONIUM BROMIDE 100 MG/10ML IV SOLN
INTRAVENOUS | Status: DC | PRN
  Administered 2019-10-24: 50 mg via INTRAVENOUS

## 2019-10-24 MED ORDER — LIDOCAINE HCL (CARDIAC) PF 100 MG/5ML IV SOSY
PREFILLED_SYRINGE | INTRAVENOUS | Status: DC | PRN
Start: 2019-10-24 — End: 2019-10-24
  Administered 2019-10-24: 60 mg via INTRATRACHEAL

## 2019-10-24 MED ORDER — METHOCARBAMOL 1000 MG/10ML IJ SOLN
500.0000 mg | Freq: Four times a day (QID) | INTRAVENOUS | Status: DC | PRN
  Filled 2019-10-24: qty 5

## 2019-10-24 MED ORDER — LACTATED RINGERS IV SOLN: INTRAVENOUS | Status: DC | PRN

## 2019-10-24 MED ORDER — 0.9 % SODIUM CHLORIDE (POUR BTL) OPTIME
TOPICAL | Status: DC | PRN
  Administered 2019-10-24 (×2): 1000 mL

## 2019-10-24 MED ORDER — ONDANSETRON HCL 4 MG/2ML IJ SOLN
INTRAMUSCULAR | Status: DC | PRN
Start: 2019-10-24 — End: 2019-10-24
  Administered 2019-10-24: 4 mg via INTRAVENOUS

## 2019-10-24 MED ORDER — PHENYLEPHRINE HCL (PRESSORS) 10 MG/ML IV SOLN
INTRAVENOUS | Status: DC | PRN
  Administered 2019-10-24: 120 ug via INTRAVENOUS

## 2019-10-24 MED ORDER — PROPOFOL 10 MG/ML IV BOLUS
INTRAVENOUS | Status: DC | PRN
  Administered 2019-10-24: 130 mg via INTRAVENOUS

## 2019-10-24 MED ORDER — SUGAMMADEX SODIUM 200 MG/2ML IV SOLN
INTRAVENOUS | Status: DC | PRN
  Administered 2019-10-24: 200 mg via INTRAVENOUS

## 2019-10-24 MED ORDER — FENTANYL CITRATE (PF) 250 MCG/5ML IJ SOLN
INTRAMUSCULAR | Status: AC
  Filled 2019-10-24: qty 5

## 2019-10-24 MED ORDER — HYDROMORPHONE HCL 1 MG/ML IJ SOLN
0.5000 mg | INTRAMUSCULAR | Status: DC | PRN
  Administered 2019-10-24 – 2019-10-26 (×4): 1 mg via INTRAVENOUS
  Administered 2019-10-26: 0.5 mg via INTRAVENOUS
  Administered 2019-10-26 – 2019-10-28 (×5): 1 mg via INTRAVENOUS
  Filled 2019-10-24 (×10): qty 1

## 2019-10-24 SURGICAL SUPPLY — 47 items
APL PRP STRL LF DISP 70% ISPRP (MISCELLANEOUS) ×2
BLADE CLIPPER SURG (BLADE) IMPLANT
CANISTER SUCT 3000ML PPV (MISCELLANEOUS) ×4 IMPLANT
CHLORAPREP W/TINT 26 (MISCELLANEOUS) ×4 IMPLANT
COVER SURGICAL LIGHT HANDLE (MISCELLANEOUS) ×4 IMPLANT
COVER WAND RF STERILE (DRAPES) IMPLANT
DRAIN CHANNEL 19F RND (DRAIN) ×4 IMPLANT
DRAPE LAPAROSCOPIC ABDOMINAL (DRAPES) ×4 IMPLANT
DRAPE WARM FLUID 44X44 (DRAPES) ×4 IMPLANT
DRSG OPSITE POSTOP 4X10 (GAUZE/BANDAGES/DRESSINGS) IMPLANT
DRSG OPSITE POSTOP 4X8 (GAUZE/BANDAGES/DRESSINGS) IMPLANT
DRSG PAD ABDOMINAL 8X10 ST (GAUZE/BANDAGES/DRESSINGS) ×8 IMPLANT
ELECT BLADE 6.5 EXT (BLADE) ×4 IMPLANT
ELECT CAUTERY BLADE 6.4 (BLADE) ×4 IMPLANT
ELECT REM PT RETURN 9FT ADLT (ELECTROSURGICAL) ×4
ELECTRODE REM PT RTRN 9FT ADLT (ELECTROSURGICAL) ×2 IMPLANT
EVACUATOR SILICONE 100CC (DRAIN) ×4 IMPLANT
GAUZE SPONGE 4X4 12PLY STRL (GAUZE/BANDAGES/DRESSINGS) ×4 IMPLANT
GLOVE BIO SURGEON STRL SZ 6 (GLOVE) ×4 IMPLANT
GLOVE INDICATOR 6.5 STRL GRN (GLOVE) ×8 IMPLANT
GOWN STRL REUS W/ TWL LRG LVL3 (GOWN DISPOSABLE) ×4 IMPLANT
GOWN STRL REUS W/TWL LRG LVL3 (GOWN DISPOSABLE) ×8
HANDLE SUCTION POOLE (INSTRUMENTS) ×2 IMPLANT
KIT BASIN OR (CUSTOM PROCEDURE TRAY) ×4 IMPLANT
KIT OSTOMY DRAINABLE 2.75 STR (WOUND CARE) ×4 IMPLANT
KIT TURNOVER KIT B (KITS) ×4 IMPLANT
LIGASURE IMPACT 36 18CM CVD LR (INSTRUMENTS) IMPLANT
NS IRRIG 1000ML POUR BTL (IV SOLUTION) ×8 IMPLANT
PACK GENERAL/GYN (CUSTOM PROCEDURE TRAY) ×4 IMPLANT
PAD ARMBOARD 7.5X6 YLW CONV (MISCELLANEOUS) ×4 IMPLANT
PENCIL SMOKE EVACUATOR (MISCELLANEOUS) ×4 IMPLANT
SPECIMEN JAR LARGE (MISCELLANEOUS) IMPLANT
SPONGE LAP 18X18 RF (DISPOSABLE) ×8 IMPLANT
STAPLER CUT CVD 40MM GREEN (STAPLE) ×8 IMPLANT
STAPLER CUT RELOAD GREEN (STAPLE) ×8 IMPLANT
STAPLER VISISTAT 35W (STAPLE) ×4 IMPLANT
SUCTION POOLE HANDLE (INSTRUMENTS) ×4
SUT ETHILON 2 0 FS 18 (SUTURE) ×4 IMPLANT
SUT PDS AB 1 TP1 96 (SUTURE) ×8 IMPLANT
SUT SILK 2 0 SH CR/8 (SUTURE) ×4 IMPLANT
SUT SILK 2 0 TIES 10X30 (SUTURE) ×4 IMPLANT
SUT SILK 3 0 SH CR/8 (SUTURE) ×4 IMPLANT
SUT SILK 3 0 TIES 10X30 (SUTURE) ×4 IMPLANT
SUT VIC AB 3-0 SH 18 (SUTURE) ×4 IMPLANT
TAPE CLOTH SURG 6X10 WHT LF (GAUZE/BANDAGES/DRESSINGS) ×4 IMPLANT
TOWEL GREEN STERILE (TOWEL DISPOSABLE) ×4 IMPLANT
TRAY FOLEY MTR SLVR 16FR STAT (SET/KITS/TRAYS/PACK) IMPLANT

## 2019-10-24 NOTE — Transfer of Care (Signed)
Immediate Anesthesia Transfer of Care Note  Patient: Natasha Barrera  Procedure(s) Performed: EXPLORATORY LAPAROTOMY (N/A Abdomen) COLOSTOMY (Left Abdomen)  Patient Location: PACU  Anesthesia Type:General  Level of Consciousness: awake  Airway & Oxygen Therapy: Patient Spontanous Breathing and Patient connected to face mask oxygen  Post-op Assessment: Report given to RN and Post -op Vital signs reviewed and stable  Post vital signs: Reviewed and stable  Last Vitals:  Vitals Value Taken Time  BP 114/79 10/24/19 0352  Temp    Pulse 108 10/24/19 0352  Resp 24 10/24/19 0352  SpO2 91 % 10/24/19 0352  Vitals shown include unvalidated device data.  Last Pain:  Vitals:   10/23/19 2332  TempSrc: Oral  PainSc:          Complications: No complications documented.

## 2019-10-24 NOTE — Anesthesia Postprocedure Evaluation (Signed)
Anesthesia Post Note  Patient: Natasha Barrera  Procedure(s) Performed: EXPLORATORY LAPAROTOMY (N/A Abdomen) COLOSTOMY (Left Abdomen)     Patient location during evaluation: PACU Anesthesia Type: General Level of consciousness: awake and alert Pain management: pain level controlled Vital Signs Assessment: post-procedure vital signs reviewed and stable Respiratory status: spontaneous breathing, nonlabored ventilation, respiratory function stable and patient connected to nasal cannula oxygen Cardiovascular status: blood pressure returned to baseline and stable Postop Assessment: no apparent nausea or vomiting Anesthetic complications: no   No complications documented.  Last Vitals:  Vitals:   10/24/19 0415 10/24/19 0430  BP: 101/73 (P) 103/71  Pulse: 93   Resp: 14   Temp:  (!) (P) 36.4 C  SpO2: 98%     Last Pain:  Vitals:   10/23/19 2332  TempSrc: Oral  PainSc:                  Catalina Gravel

## 2019-10-24 NOTE — Progress Notes (Addendum)
Patient 11 patient ID: Natasha Barrera, female   DOB: April 03, 1965, 54 y.o.   MRN: [id number]    Progress Note   Subjective  Day # 6  Events of past 24 hours noted and reviewed. Patient developed severe abdominal pain late yesterday afternoon. CT of the abdomen pelvis showed extensive pneumoperitoneum. Surgery was consulted and she underwent emergent partial colectomy and colostomy per Dr. Windle Guard early this morning.  Patient stable this afternoon, alert, complaining of discomfort secondary to NG and crampy lower abdominal pain as expected.  Labs today pending  On Zosyn Solu-Medrol discontinued    Objective   Vital signs in last 24 hours: Temp:  [96.2 F (35.7 C)-97.7 F (36.5 C)] 96.6 F (35.9 C) (07/24 1200) Pulse Rate:  [79-111] 98 (07/24 1400) Resp:  [11-25] 15 (07/24 1400) BP: (82-116)/(62-79) 95/67 (07/24 1400) SpO2:  [89 %-100 %] 100 % (07/24 1400) Weight:  [77 kg] 77 kg (07/24 0452) Last BM Date: 10/23/19 General: [name] female in NAD.  NG in place Heart:  Regular rate and rhythm; no murmurs Lungs: Respirations even and unlabored, lungs CTA bilaterally Abdomen:  Soft, bowel sounds quiet Extremities: Upper and lower extremities with mild edema and oozing. Neurologic:  Alert and oriented,  grossly normal neurologically. Psych:  Cooperative. Normal mood and affect.  Intake/Output from previous day: 07/23 0701 - 07/24 0700 In: 2343.7 [P.O.:240; I.V.:2000; IV Piggyback:73.7] Out: 720 [Urine:450; Drains:120; Blood:150] Intake/Output this shift: Total I/O In: 2784.4 [I.V.:381; Other:200; IV Piggyback:2203.4] Out: -   Lab Results: Recent Labs    10/21/19 2204 10/22/19 1949 10/24/19 0142  WBC 4.4 8.0  --   HGB 10.4* 10.7* 10.2*  HCT 30.7* 32.0* 30.0*  PLT 353 302  --    BMET Recent Labs    10/21/19 2204 10/22/19 1949 10/24/19 0142  NA 132* 130* 132*  K 3.8 4.6 4.0  CL 101 103 98  CO2 22 20*  --   GLUCOSE 114* 115* 112*  BUN 6 7 9   CREATININE 0.43*  0.46 0.30*  CALCIUM 7.3* 7.5*  --    LFT Recent Labs    10/22/19 1949  PROT 4.4*  ALBUMIN 1.5*  AST 18  ALT 16  ALKPHOS 88  BILITOT 1.0   PT/INR No results for input(s): LABPROT, INR in the last 72 hours.  Studies/Results: DG Abd 1 View  Result Date: 10/23/2019 CLINICAL DATA:  Abdominal pain and diarrhea. EXAM: ABDOMEN - 1 VIEW COMPARISON:  None. FINDINGS: A single mildly dilated small bowel is seen overlying the left upper quadrant. There is no evidence of free air. No radio-opaque calculi or other significant radiographic abnormality are seen. IMPRESSION: Single mildly dilated small bowel overlying the left upper quadrant. This may represent focal ileus or early small bowel obstruction. Correlation with abdomen pelvis CT is recommended. Electronically Signed   By: Virgina Norfolk M.D.   On: 10/23/2019 17:03   CT ABDOMEN PELVIS W CONTRAST  Result Date: 10/23/2019 CLINICAL DATA:  Abdominal pain, diarrhea EXAM: CT ABDOMEN AND PELVIS WITH CONTRAST TECHNIQUE: Multidetector CT imaging of the abdomen and pelvis was performed using the standard protocol following bolus administration of intravenous contrast. CONTRAST:  148m OMNIPAQUE IOHEXOL 350 MG/ML SOLN COMPARISON:  10/02/2019, 10/23/2019 FINDINGS: Lower chest: There are small bilateral pleural effusions, with minimal dependent lower lobe atelectasis. Hepatobiliary: Diffuse hepatic steatosis without focal liver abnormality. The gallbladder is unremarkable. Pancreas: Unremarkable. No pancreatic ductal dilatation or surrounding inflammatory changes. Spleen: Normal in size without focal abnormality. Adrenals/Urinary Tract: Adrenal  glands are unremarkable. Kidneys are normal, without renal calculi, focal lesion, or hydronephrosis. Bladder is unremarkable. Stomach/Bowel: There is marked diffuse colonic wall thickening most pronounced from the cecum through the splenic flexure, compatible with inflammatory or infectious colitis. There is extensive  pneumoperitoneum, most likely from colonic perforation. Surgical consultation is recommended. No evidence of small-bowel obstruction. Oral contrast has progressed into the colon by the time of the exam. Vascular/Lymphatic: There is marked atherosclerosis of the aorta. Since the previous exam, focal mural thrombus has developed within the infrarenal aorta, reference images 27 through 31, with less than 50% narrowing. No pathologic adenopathy. Reproductive: Uterus and bilateral adnexa are unremarkable. Other: Extensive pneumoperitoneum is seen throughout the upper abdomen most consistent with colonic perforation. Small amount of free fluid is seen within the lower pelvis. There is diffuse body wall edema. Musculoskeletal: No acute or destructive bony lesions. Reconstructed images demonstrate no additional findings. IMPRESSION: 1. Extensive pneumoperitoneum, most consistent with colonic perforation given the persistent diffuse colitis. Surgical consultation is recommended. 2. Interval development of focal mural thrombus within the infrarenal aorta, with less than 50% narrowing. 3. Small bilateral pleural effusions. 4. Diffuse hepatic steatosis. 5. Aortic Atherosclerosis (ICD10-I70.0). These results were called by telephone at the time of interpretation on 10/23/2019 at 10:01 pm to provider Sampson Si , who verbally acknowledged these results. Electronically Signed   By: Randa Ngo M.D.   On: 10/23/2019 22:05       Assessment / Plan:    #34 54 year old [place] female with severe diffuse colitis, favor Crohn's colitis, based on endoscopic appearance and biopsies, and with new diagnosis this admission, and colonoscopy 10/22/2019. Patient started on Solu-Medrol. Unfortunately developed perforated Crohn's disease yesterday afternoon, as confirmed by acute abdominal pain and pneumoperitoneum on CT, and required emergency surgery with partial left colectomy and colostomy early this a.m.  She is stable postop, on IV  Zosyn  Solu-Medrol has been discontinued, and will not plan to resume in immediate postop.  #2 anemia secondary to above-improved after 2 units packed RBCs #3 hypoosmolar hyponatremic hypovolemia-continuing volume replacement #4 hypotension since admission felt secondary to sepsis versus hypovolemia, significant for vascular depletion.  Medicine managing fluids  #5 nutrition-with severe hypoalbuminemia, and poor recent oral intake with significant weight loss, and now postop for bowel perforation, unlikely she will be able to take adequate nutrition in the short-term.  Would favor starting TPN. Once she is over the acute postop.,  Can readdress management of her severe Crohn's colitis.  Ultimately she will require biologic therapy. We will continue to follow with you  Principal Problem:   Colitis Active Problems:   Hyponatremia   Malnutrition of moderate degree (HCC)   Hypotension due to hypovolemia   Anemia   Weight loss, unintentional   Esophageal ring    LOS: 5 days   Amy Esterwood PA-C 10/24/2019, 2:51 PM   GI ATTENDING  Interval history and data reviewed.  Patient personally seen and examined.  Agree with interval progress note.  Patient developed an acute abdomen yesterday afternoon secondary to perforated Crohn's disease.  Now status post partial colectomy with colostomy.  Stable.  Agree with plans as outlined above.  Docia Chuck. Geri Seminole., M.D. [facility] Division of Gastroenterology

## 2019-10-24 NOTE — Progress Notes (Signed)
Subjective: Interviewed patient at bedside, husband was present. Discussed with husband events of surgery last night. Discussed that surgery stated a temporary colostomy bag and attempt reattachment in 6 months. Husband states patient has been very sleepy this morning. Patient states some pain in the stomach.   Objective:  Vital signs in last 24 hours: Vitals:   10/24/19 0415 10/24/19 0430 10/24/19 0452 10/24/19 0500  BP: 101/73 103/71 108/73 107/74  Pulse: 93 98 (!) 111 98  Resp: 14 17 (!) 25 21  Temp:  (!) 97.5 F (36.4 C)    TempSrc:      SpO2: 98% 96% 93% 99%  Weight:   77 kg   Height:   5' 1"  (1.549 m)    General: Middle aged female, frail, tired appearing Cardiac: Tachycardic, no m/r/g Pulmonary: CTABL, no wheezing, rhonchi, rales Abdomen: Soft, TTP diffusely, colostomy in place, no erythema around base  Assessment/Plan:  Principal Problem:   Colitis Active Problems:   Hyponatremia   Malnutrition of moderate degree (HCC)   Hypotension due to hypovolemia   Anemia   Weight loss, unintentional   Esophageal ring  Patientis a 54 year old female with history of hypertension, diverticulitis status post sigmoid colectomy in 2015, ventral hernia status post repair, prior episode of colitis in her teens who was admitted for abd discomfort, n/v, weight loss, hypotension, and electrolyte derangements who presented with bilateral LE swelling and continued nausea, vomiting, diarrhea, weight loss, and generalized fatigue. Noted to have significant electrolyte abnormalities and hypotension.Colonoscopy suggestive of Crohn's disease.Developed pneumoperitoneum on 7/23 and taken emergently to surgery for an ex-lap.   Inflammatory colitis 2/2 Crohns: Continues to have abdominal pain. S/P egd/colonoscopy 7/21 by Dr. Henrene Pastor that showed findings concerning for Crohns that was confirmed with pathology results. Currently on Solu-cortef IV. BP remains soft, patient awake and alert, with warm  extremities. Will continue fluids however may require pressor support if she does not respond to fluids.   -1L LR bolus -maintenance fluids 150 cc/hr -Q2 hr vitals -solumedrol 40 mg q12 hours -GI following, appreciate recommendations -f/u TPMT lab --> pretreatment screening for thiopurines (AZA and 6-MP) -f/u TB quantiferon --> screening before possible immunomodulator therapy -f/u Hep A and Hep B screening  Pneumoperitoneum s/p partial colectomy and colostomy: Procedure done by Dr. Windle Guard 7/24. Unclear etiology of perforation, has significant abdominal inflammation. Continues to have abdominal pain, colostomy in place. Surgery following. NGT in place, awaiting bowel function.   -Continue NGT -NPO -Monitor abdominal exam -Surgery following appreciate recommendations  Anemia: Hgb was down to 6.3 and improved to 10.4 after 2 units. Taken for surgery this morning. Hgb 10.2 today.  -Monitor Hgb, transfuse as needed  Hypoosmolar hyponatremic hypovolemia Na 132 today, likely 2/2 hypovolemia, continuing fluids.   -Monitor BMP  Hypotension Soft pressures since admission. 2/2 sepsis vs hypovolemia. However, patient is awake, alert and is perfusing well.Continues to have soft Bps today. Appears significantly intravascularly depleted. Will continue fluids however may require pressor support if she does not respond to fluids.   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 Should improve with treatment of Crohn's disease. -Nutrition consulted  -Albumin now 1.5  Hypophosphatemia Phosphorous1.9 --> 4 after repletion  Prior to Admission Living Arrangement: Home Anticipated Discharge Location: TBD Barriers to  Discharge: Continued medical management  Dispo: Awaiting clinical improvement   Asencion Noble, MD 10/24/2019, 6:36 AM Pager: [phone] After 5pm on weekdays and 1pm on weekends: On Call pager [phone]

## 2019-10-24 NOTE — Op Note (Signed)
Operative Note  RAINI TILEY  [id number]  [id number]  10/24/2019   Surgeon: Vikki Ports A ConnorMD FACS  Procedure performed: Exploratory laparotomy, lysis of adhesions x60 minutes, segmental colectomy with end colostomy  Preop diagnosis: Pneumoperitoneum Post-op diagnosis/intraop findings: Large perforation in the region of prior rectosigmoid anastomosis  Procedure status is emergent. Infection present at the time of surgery: yes, feculent peritonitis   Specimens: Segment of sigmoid colon Retained items: 19 French round Blake drain in the pelvis EBL: 449QP Complications: none  Description of procedure: After obtaining informed consent the patient was taken to the operating room and placed supine on operating room table wheregeneral endotracheal anesthesia was initiated, preoperative antibiotics were administered, SCDs applied, and a formal timeout was performed.  A Foley catheter was inserted.  The abdomen was prepped and draped in usual sterile fashion.  A midline laparotomy was created and upon entering the peritoneal cavity there was evacuation of a large volume of air.  Inferiorly along the incision was the patient's prior ventral Lex mesh, to which there was a fair amount of omental adhesions.  These were taken down bluntly and with cautery.  A Balfour retractor was placed once the incision was completed.  The stomach was inspected and appeared normal, the NG tube is palpated in the body of the stomach.  The cecum appears normal, there is edema and fullness of the ascending colon and transverse colon but no obvious perforation or adjacent acute inflammation.  The small bowel was inspected and there is no injury however there are a large amount of adhesions between the small bowel and the sigmoid colon as well as to the pelvis.  These were sharply divided in order to mobilize the small bowel out of the pelvis and expose the sigmoid.  As we progressed exploring the abdomen, stool was  noted emanating from the left lower quadrant.  The descending and sigmoid colon as well as the rectum were then visualized and there was a large, greater than 1 cm perforation of the anterior surface of the rectosigmoid area, likely in the region of prior anastomosis.  The lateral attachments of the sigmoid were taken down with cautery and window was created through the mesentery on either side of this perforation.  The ureter was identified and protected.  A contour green load stapler was then used to divide the bowel proximal to and distal to the perforation, the distal staple and is on the rectum.  A small amount of bleeding here was controlled with figure-of-eight 3-0 silk sutures.  The mesentery to the perforated segment is divided and controlled with suture ligature of 2-0 silk.  The specimen is then handed off and the abdomen inspected for hemostasis which is found to be adequate.  The abdomen was irrigated with several liters of warm sterile saline.  A colostomy site is created in the left upper quadrant in the sigmoid staple line brought up through this without tension.  A 19 French round Blake drain is placed in the pelvis, anterior to the rectal stump and along the left colic gutter.  This is secured the skin with a 2-0 nylon.  The small bowel was then returned to its anatomic position and the omentum brought down over top the bowel, with one of the omental tongues reaching the rectal stump.  The fascia was then closed with running looped #1 PDS, starting at either end and tying centrally.  The colostomy is matured in standard fashion, a small bleeding point on the mucosa is  controlled with a figure-of-eight 3-0 Vicryl.  Once matured, the stoma appears viable, there is clearly abnormality of the mucosa with cobblestone and inflamed appearance.  The stoma is patent through the fascia by digital examination.  A colostomy appliance was placed and a wet-to-dry dressing applied to the midline wound.  The drain  is placed to bulb suction.  The patient was then awakened, extubated and taken to PACU in stable condition.   All counts were correct at the completion of the case.

## 2019-10-24 NOTE — Anesthesia Procedure Notes (Signed)
Procedure Name: Intubation Date/Time: 10/24/2019 1:41 AM Performed by: Clovis Cao, CRNA Pre-anesthesia Checklist: Patient identified, Emergency Drugs available, Suction available, Patient being monitored and Timeout performed Patient Re-evaluated:Patient Re-evaluated prior to induction Oxygen Delivery Method: Circle system utilized Preoxygenation: Pre-oxygenation with 100% oxygen Induction Type: IV induction, Rapid sequence and Cricoid Pressure applied Laryngoscope Size: Miller and 2 Grade View: Grade I Tube type: Oral Tube size: 7.5 mm Number of attempts: 1 Airway Equipment and Method: Stylet Placement Confirmation: ETT inserted through vocal cords under direct vision,  positive ETCO2 and breath sounds checked- equal and bilateral Secured at: 21 cm Tube secured with: Tape Dental Injury: Teeth and Oropharynx as per pre-operative assessment

## 2019-10-24 NOTE — Progress Notes (Signed)
Day of Surgery   Subjective/Chief Complaint: sore   Objective: Vital signs in last 24 hours: Temp:  [97.4 F (36.3 C)-97.7 F (36.5 C)] 97.5 F (36.4 C) (07/24 0430) Pulse Rate:  [79-111] 96 (07/24 0700) Resp:  [14-25] 14 (07/24 0700) BP: (91-117)/(64-79) 91/68 (07/24 0700) SpO2:  [89 %-99 %] 98 % (07/24 0700) Weight:  [77 kg] 77 kg (07/24 0452) Last BM Date: 10/23/19  Intake/Output from previous day: 07/23 0701 - 07/24 0700 In: 2343.7 [P.O.:240; I.V.:2000; IV Piggyback:73.7] Out: 720 [Urine:450; Drains:120; Blood:150] Intake/Output this shift: No intake/output data recorded.  General appearance: alert and cooperative Resp: clear to auscultation bilaterally Cardio: regular rate and rhythm GI: soft, wound packed, ostomy pink Extremities: mild edema  Lab Results:  Recent Labs    10/21/19 2204 10/21/19 2204 10/22/19 1949 10/24/19 0142  WBC 4.4  --  8.0  --   HGB 10.4*   < > 10.7* 10.2*  HCT 30.7*   < > 32.0* 30.0*  PLT 353  --  302  --    < > = values in this interval not displayed.   BMET Recent Labs    10/21/19 2204 10/21/19 2204 10/22/19 1949 10/24/19 0142  NA 132*   < > 130* 132*  K 3.8   < > 4.6 4.0  CL 101   < > 103 98  CO2 22  --  20*  --   GLUCOSE 114*   < > 115* 112*  BUN 6   < > 7 9  CREATININE 0.43*   < > 0.46 0.30*  CALCIUM 7.3*  --  7.5*  --    < > = values in this interval not displayed.   PT/INR No results for input(s): LABPROT, INR in the last 72 hours. ABG Recent Labs    10/21/19 1247  PHART 7.547*  HCO3 23.6    Studies/Results: DG Abd 1 View  Result Date: 10/23/2019 CLINICAL DATA:  Abdominal pain and diarrhea. EXAM: ABDOMEN - 1 VIEW COMPARISON:  None. FINDINGS: A single mildly dilated small bowel is seen overlying the left upper quadrant. There is no evidence of free air. No radio-opaque calculi or other significant radiographic abnormality are seen. IMPRESSION: Single mildly dilated small bowel overlying the left upper  quadrant. This may represent focal ileus or early small bowel obstruction. Correlation with abdomen pelvis CT is recommended. Electronically Signed   By: Virgina Norfolk M.D.   On: 10/23/2019 17:03   CT ABDOMEN PELVIS W CONTRAST  Result Date: 10/23/2019 CLINICAL DATA:  Abdominal pain, diarrhea EXAM: CT ABDOMEN AND PELVIS WITH CONTRAST TECHNIQUE: Multidetector CT imaging of the abdomen and pelvis was performed using the standard protocol following bolus administration of intravenous contrast. CONTRAST:  12m OMNIPAQUE IOHEXOL 350 MG/ML SOLN COMPARISON:  10/02/2019, 10/23/2019 FINDINGS: Lower chest: There are small bilateral pleural effusions, with minimal dependent lower lobe atelectasis. Hepatobiliary: Diffuse hepatic steatosis without focal liver abnormality. The gallbladder is unremarkable. Pancreas: Unremarkable. No pancreatic ductal dilatation or surrounding inflammatory changes. Spleen: Normal in size without focal abnormality. Adrenals/Urinary Tract: Adrenal glands are unremarkable. Kidneys are normal, without renal calculi, focal lesion, or hydronephrosis. Bladder is unremarkable. Stomach/Bowel: There is marked diffuse colonic wall thickening most pronounced from the cecum through the splenic flexure, compatible with inflammatory or infectious colitis. There is extensive pneumoperitoneum, most likely from colonic perforation. Surgical consultation is recommended. No evidence of small-bowel obstruction. Oral contrast has progressed into the colon by the time of the exam. Vascular/Lymphatic: There is marked atherosclerosis of  the aorta. Since the previous exam, focal mural thrombus has developed within the infrarenal aorta, reference images 27 through 31, with less than 50% narrowing. No pathologic adenopathy. Reproductive: Uterus and bilateral adnexa are unremarkable. Other: Extensive pneumoperitoneum is seen throughout the upper abdomen most consistent with colonic perforation. Small amount of free  fluid is seen within the lower pelvis. There is diffuse body wall edema. Musculoskeletal: No acute or destructive bony lesions. Reconstructed images demonstrate no additional findings. IMPRESSION: 1. Extensive pneumoperitoneum, most consistent with colonic perforation given the persistent diffuse colitis. Surgical consultation is recommended. 2. Interval development of focal mural thrombus within the infrarenal aorta, with less than 50% narrowing. 3. Small bilateral pleural effusions. 4. Diffuse hepatic steatosis. 5. Aortic Atherosclerosis (ICD10-I70.0). These results were called by telephone at the time of interpretation on 10/23/2019 at 10:01 pm to provider Natasha Barrera , who verbally acknowledged these results. Electronically Signed   By: Randa Ngo M.D.   On: 10/23/2019 22:05    Anti-infectives: Anti-infectives (From admission, onward)   Start     Dose/Rate Route Frequency Ordered Stop   10/24/19 0600  piperacillin-tazobactam (ZOSYN) IVPB 3.375 g     Discontinue     3.375 g 12.5 mL/hr over 240 Minutes Intravenous Every 8 hours 10/23/19 2305     10/24/19 0000  piperacillin-tazobactam (ZOSYN) IVPB 4.5 g  Status:  Discontinued        4.5 g 200 mL/hr over 30 Minutes Intravenous Every 6 hours 10/23/19 2257 10/23/19 2302   10/23/19 2315  piperacillin-tazobactam (ZOSYN) IVPB 3.375 g        3.375 g 100 mL/hr over 30 Minutes Intravenous STAT 10/23/19 2302 10/23/19 2347      Assessment/Plan: Active Crohn's colitis - S/P EGD/colonoscopy 7/21 by Dr. Henrene Barrera, per GI Sigmoid colon perforation - S/P partial colectomy and colostomy by Dr. Kae Barrera 7/24. Ice chips, await bowel function Sepsis - Zosyn, albumin bolus, per primary  I spoke with her husband at length about operative findings and the plan going forward.  LOS: 5 days    Natasha Barrera 10/24/2019

## 2019-10-24 NOTE — Progress Notes (Signed)
CRITICAL VALUE ALERT  Critical Value:  Lactic acid = 2.2  Date & Time Notied:  10/23/19 2342  Provider Notified: Foy Guadalajara MD  Orders Received/Actions taken: No new orders given.  Nursing staff to continue to monitor.

## 2019-10-24 NOTE — Consult Note (Signed)
NAME:  Natasha Barrera, MRN:  [id number], DOB:  Jul 05, 1965, LOS: 5 ADMISSION DATE:  10/18/2019, CONSULTATION DATE: 10/24/2019 REFERRING MD:  Dr. Philipp Ovens, CHIEF COMPLAINT: Hypertension  Brief History   54 year old female admitted with lower extremity edema and abdominal pain, work-up during admission revealed Crohn's disease confirmed with upper and lower endoscopy and pathological results.  Underwent exploratory laparotomy with lysis of adhesions due to pneumoperitoneum early morning of 7/24.  PCCM consulted for persistent hypotension afternoon of 7/24  History of present illness   Natasha Barrera is a 54 year old female with a past medical history significant for hypertension and diverticulitis (status post sigmoid colectomy 2015 ) who originally presented with complaints of lower extremity edema with later reports of abdominal pain and decreased appetite.  Patient underwent upper and lower endoscopy 7/29 with Dr. Henrene Pastor that showed findings concerning for Crohn's colitis, this was confirmed with pathological report.    Afternoon of 7/23 patient developed acute onset upper abdominal pain resulting in urgent CT of the abdomen that revealed large amount of pneumoperitoneum for which patient underwent exploratory laparotomy with lysis of adhesions and segmental colectomy with end colostomy by general surgery  Post surgery patient was seen with progressive hypotension despite IV hydration and IV albumin in the setting of severe hypoalbuminemia.  PCCM consulted for further management and possible need of vasopressors.  Past Medical History  Hypertension Diverticulitis  Significant Hospital Events   Admitted 7/18  Consults:  General surgery Gastroenterology PCCM  Procedures:  Exploratory laparotomy with lysis of adhesions and segmental colectomy with end colostomy by general surgery 7/24  Significant Diagnostic Tests:  CT angio chest 7/18 > negative for acute PE, emphysema  CT abdomen and  pelvis 7/23 >  1.extensive pneumoperitoneum 2.  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  Small bilateral pleural effusions 4.  Diffuse hepatic steatosis  Micro Data:  Covid 7/18 > negative  C. Difficile 7/20 > negative Surgical PCR 7/23 > negative  Antimicrobials:  Zosyn 7/24 >  Interim history/subjective:  Lying in bed with acute complaints of sore throat.  Husband and daughter at bedside and updated regarding plan  Objective   Blood pressure (!) 81/57, pulse 103, temperature (!) 96.6 F (35.9 C), temperature source Axillary, resp. rate 14, height 5' 1"  (1.549 m), weight 77 kg, last menstrual period 12/15/2014, SpO2 96 %.        Intake/Output Summary (Last 24 hours) at 10/24/2019 1636 Last data filed at 10/24/2019 1600 Gross per 24 hour  Intake 5273.46 ml  Output 720 ml  Net 4553.46 ml   Filed Weights   10/18/19 1620 10/24/19 0452  Weight: 53.5 kg 77 kg    Examination: General: Pale deconditioned middle-aged female lying in bed in no acute distress HEENT: Normocephalic/atraumatic, MM pink/moist, PERRL, NG tube to low intermittent wall suction Neuro: Slightly lethargic but easily arousable, alert and oriented x3, nonfocal CV: s1s2 regular rate and rhythm, no murmur, rubs, or gallops,  PULM: Slightly diminished but no added breath sounds, no increased work of breathing GI: soft, bowel sounds hypoactive active in all 4 quadrants, slightly tender to palpation, non-distended, NG tube to low intermittent wall suction, abdominal binder in place, colostomy Extremities: warm/dry, generalized 2+ pitting edema in both upper and lower extremities Skin: no rashes or lesions   Resolved Hospital Problem list     Assessment & Plan:  Persistent hypotension  -Patient has been seen with persistent hypotension since admission but has progressively worsened over the clinical  course.   -This is likely secondary to severe  hypoalbuminemia P: Hypotension unresponsive to IV fluids and albumin Peripheral Levophed for map goal greater than 65 Continue IV Solu-Medrol Once able utilize GI tract for protein supplementation  Inflammatory colitis secondary to Crohn's disease -Patient underwent both upper and lower endoscopies 7/21 by Dr. Henrene Pastor which revealed signs of Crohn's this was confirmed with biopsy. P: Primary management per gastroenterology Continue IV Solu-Cortef Status post IV hydration and albumin bolus with continued hypotension Continue to monitor blood pressure in the ICU setting TB QuantiFERON and hepatitis a and B screening prior to immunomodulatory therapy  Pneumoperitoneum status post partial colectomy and end colostomy -Patient reported acute onset abdominal pain afternoon of 7/23 for which CT abdomen obtained.  This revealed a large pneumoperitoneum therefore general surgery was consulted and patient underwent exploratory laparotomy with lysis of adhesions and segmental colectomy with end colostomy P: General surgery consulted and following Continue NG tube to low intermittent wall suction Remains n.p.o. If n.p.o. status is prolonged may need to consider TPA  Anemia -Status post 2 units of packed red blood cells during admission thus far for hemoglobin drop of 6.1 P: Trend CBC Monitor for signs of bleeding Transfuse for hemoglobin less than 7  Hyperosmolar hyponatremic hypokalemia -Likely secondary to hypovolemia, sodium remained stable in the low 130s P: Trend Bmet  Hypokalemia -Improving P: Trend to be met Supplement as needed   Best practice:  Diet: N.p.o. Pain/Anxiety/Delirium protocol (if indicated): As needed VAP protocol (if indicated): Not applicable DVT prophylaxis: SCDs GI prophylaxis: PPI Glucose control: Monitor Mobility: Bedrest Code Status: Full code Family Communication: Patient's husband and daughter updated extensively at bedside 7/24 Disposition:  ICU  Labs   CBC: Recent Labs  Lab 10/18/19 1649 10/18/19 1649 10/19/19 0308 10/21/19 1240 10/21/19 1247 10/21/19 1257 10/21/19 2204 10/22/19 1949 10/24/19 0142  WBC 8.6  --  5.1  --   --  4.2 4.4 8.0  --   NEUTROABS  --   --   --   --   --   --  3.7  --   --   HGB 10.4*   < > 7.9*   < > 6.1* 6.3* 10.4* 10.7* 10.2*  HCT 30.9*   < > 24.4*   < > 18.0* 19.5* 30.7* 32.0* 30.0*  MCV 101.3*  --  103.0*  --   --  103.7* 94.5 95.0  --   PLT 593*  --  395  --   --  362 353 302  --    < > = values in this interval not displayed.    Basic Metabolic Panel: Recent Labs  Lab 10/18/19 2116 10/19/19 0308 10/19/19 0328 10/19/19 0830 10/20/19 0338 10/20/19 0338 10/20/19 0928 10/20/19 0928 10/20/19 1850 10/20/19 1850 10/21/19 1240 10/21/19 1247 10/21/19 2204 10/22/19 1949 10/24/19 0142  NA  --    < >  --    < > 127*   < > 128*   < > 130*   < > 135 135 132* 130* 132*  K  --    < >  --    < > 3.8   < > 3.6   < > 3.3*   < > 2.6* 3.1* 3.8 4.6 4.0  CL  --    < >  --    < > 95*   < > 96*   < > 100  --  98  --  101 103 98  CO2  --    < >  --    < >  25  --  26  --  23  --   --   --  22 20*  --   GLUCOSE  --    < >  --    < > 92   < > 96   < > 105*  --  87  --  114* 115* 112*  BUN  --    < >  --    < > <5*   < > 5*   < > 5*  --  5*  --  6 7 9   CREATININE  --    < >  --    < > 0.47   < > 0.48   < > 0.45  --  <0.20*  --  0.43* 0.46 0.30*  CALCIUM  --    < >  --    < > 6.7*  --  6.7*  --  6.8*  --   --   --  7.3* 7.5*  --   MG 1.9  --   --   --   --   --   --   --   --   --   --   --   --   --   --   PHOS 1.5*  --  1.9*  --   --   --   --   --   --   --   --   --  4.0  --   --    < > = values in this interval not displayed.   GFR: Estimated Creatinine Clearance: 75.5 mL/min (A) (by C-G formula based on SCr of 0.3 mg/dL (L)). Recent Labs  Lab 10/19/19 0308 10/21/19 1257 10/21/19 2204 10/22/19 1949 10/23/19 2239  WBC 5.1 4.2 4.4 8.0  --   LATICACIDVEN  --   --   --   --  2.2*     Liver Function Tests: Recent Labs  Lab 10/18/19 2116 10/19/19 0308 10/22/19 1949  AST 15 13* 18  ALT 14 11 16   ALKPHOS 91 81 88  BILITOT 1.3* 1.4* 1.0  PROT 4.4* 3.7* 4.4*  ALBUMIN 1.0* <1.0* 1.5*   No results for input(s): LIPASE, AMYLASE in the last 168 hours. No results for input(s): AMMONIA in the last 168 hours.  ABG    Component Value Date/Time   PHART 7.547 (H) 10/21/2019 1247   PCO2ART 27.0 (L) 10/21/2019 1247   PO2ART 106 10/21/2019 1247   HCO3 23.6 10/21/2019 1247   TCO2 22 10/24/2019 0142   O2SAT 99.0 10/21/2019 1247     Coagulation Profile: No results for input(s): INR, PROTIME in the last 168 hours.  Cardiac Enzymes: No results for input(s): CKTOTAL, CKMB, CKMBINDEX, TROPONINI in the last 168 hours.  HbA1C: No results found for: HGBA1C  CBG: No results for input(s): GLUCAP in the last 168 hours.  Review of Systems: Positive in bold  Gen: Denies fever, chills, weight change, fatigue, night sweats HEENT: Denies blurred vision, double vision, hearing loss, tinnitus, sinus congestion, rhinorrhea, sore throat, neck stiffness, dysphagia PULM: Denies shortness of breath, cough, sputum production, hemoptysis, wheezing CV: Denies chest pain, edema, orthopnea, paroxysmal nocturnal dyspnea, palpitations GI: Denies abdominal pain, nausea, vomiting, diarrhea, hematochezia, melena, constipation, change in bowel habits GU: Denies dysuria, hematuria, polyuria, oliguria, urethral discharge Endocrine: Denies hot or cold intolerance, polyuria, polyphagia or appetite change Derm: Denies rash, dry skin, scaling or peeling skin change Heme: Denies easy bruising, bleeding, bleeding gums Neuro:  Denies headache, numbness, weakness, slurred speech, loss of memory or consciousness  Past Medical History  She,  has a past medical history of Diverticul disease small and large intestine, no perforati or abscess, Diverticulitis of colon, and Hypertension.   Surgical History     Past Surgical History:  Procedure Laterality Date  . BIOPSY  10/21/2019   Procedure: BIOPSY;  Surgeon: Irene Shipper, MD;  Location: [facility] ENDOSCOPY;  Service: Endoscopy;;  . COLON SURGERY  12/25/2011  . COLONOSCOPY    . COLONOSCOPY WITH PROPOFOL N/A 10/21/2019   Procedure: COLONOSCOPY WITH PROPOFOL;  Surgeon: Irene Shipper, MD;  Location: [facility] ENDOSCOPY;  Service: Endoscopy;  Laterality: N/A;  . ESOPHAGOGASTRODUODENOSCOPY (EGD) WITH PROPOFOL N/A 10/21/2019   Procedure: ESOPHAGOGASTRODUODENOSCOPY (EGD) WITH PROPOFOL;  Surgeon: Irene Shipper, MD;  Location: [facility] ENDOSCOPY;  Service: Endoscopy;  Laterality: N/A;  with small bowel BX's  . [place]  2002  . VENTRAL HERNIA REPAIR N/A 12/05/2018   Procedure: LAPAROSCOPIC VENTRAL HERNIA REPAIR WITH MESH;  Surgeon: Jovita Kussmaul, MD;  Location: [place];  Service: General;  Laterality: N/A;     Social History   reports that she has quit smoking. She has never used smokeless tobacco. She reports current alcohol use. She reports that she does not use drugs.   Family History   Her family history is negative for Breast cancer.   Allergies No Known Allergies   Home Medications  Prior to Admission medications   Medication Sig Start Date End Date Taking? Authorizing Provider  Calcium Carb-Cholecalciferol (CALCIUM+D3 PO) Take 1 tablet by mouth daily.   Yes [provider]  Cholecalciferol (VITAMIN D-3) 125 MCG (5000 UT) TABS Take 5,000 Units by mouth daily.   Yes [provider]  loperamide (IMODIUM A-D) 2 MG tablet Take 1 tablet (2 mg total) by mouth 4 (four) times daily as needed for diarrhea or loose stools. 09/20/19  Yes Maudie Mercury, MD  Multiple Vitamin (MULTIVITAMIN WITH MINERALS) TABS tablet Take 1 tablet by mouth daily. Centrum Silver   Yes [provider]  potassium chloride (KLOR-CON) 10 MEQ tablet Take 2 tablets (20 mEq total) by mouth daily. Patient not taking: Reported on 10/18/2019 09/20/19   Maudie Mercury,  MD     Critical care time:   Performed by: Johnsie Cancel  Total critical care time: 40 minutes  Critical care time was exclusive of separately billable procedures and treating other patients.  Critical care was necessary to treat or prevent imminent or life-threatening deterioration.  Critical care was time spent personally by me on the following activities: development of treatment plan with patient and/or surrogate as well as nursing, discussions with consultants, evaluation of patient's response to treatment, examination of patient, obtaining history from patient or surrogate, ordering and performing treatments and interventions, ordering and review of laboratory studies, ordering and review of radiographic studies, pulse oximetry and re-evaluation of patient's condition.  Johnsie Cancel, NP-C [facility] Contact / Pager information can be found on Amion  10/24/2019, 5:47 PM

## 2019-10-24 NOTE — Progress Notes (Signed)
Naomi notified PICC to be placed 10/25/19.

## 2019-10-24 NOTE — Anesthesia Preprocedure Evaluation (Signed)
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    Endo/Other  negative endocrine ROS  Renal/GU negative Renal ROS     Musculoskeletal negative musculoskeletal ROS (+)   Abdominal   Peds  Hematology  (+) Blood dyscrasia, anemia ,   Anesthesia Other Findings Day of surgery medications reviewed with the patient.  Reproductive/Obstetrics                             Anesthesia Physical Anesthesia Plan  ASA: III and emergent  Anesthesia Plan: General   Post-op Pain Management:    Induction: Intravenous, Rapid sequence and Cricoid pressure planned  PONV Risk Score and Plan: 4 or greater and Midazolam, Ondansetron and Dexamethasone  Airway Management Planned: Oral ETT  Additional Equipment: Arterial line  Intra-op Plan:   Post-operative Plan: Possible Post-op intubation/ventilation  Informed Consent: I have reviewed the patients History and Physical, chart, labs and discussed the procedure including the risks, benefits and alternatives for the proposed anesthesia with the patient or authorized representative who has indicated his/her understanding and acceptance.     Dental advisory given  Plan Discussed with:  CRNA  Anesthesia Plan Comments:         Anesthesia Quick Evaluation

## 2019-10-25 ENCOUNTER — Encounter (HOSPITAL_COMMUNITY): Payer: Self-pay | Admitting: Surgery

## 2019-10-25 DIAGNOSIS — I10 Essential (primary) hypertension: Secondary | ICD-10-CM

## 2019-10-25 DIAGNOSIS — E46 Unspecified protein-calorie malnutrition: Secondary | ICD-10-CM

## 2019-10-25 DIAGNOSIS — I7419 Embolism and thrombosis of other parts of aorta: Secondary | ICD-10-CM

## 2019-10-25 DIAGNOSIS — R579 Shock, unspecified: Secondary | ICD-10-CM

## 2019-10-25 LAB — GLUCOSE, CAPILLARY
Glucose-Capillary: 108 mg/dL — ABNORMAL HIGH (ref 70–99)
Glucose-Capillary: 109 mg/dL — ABNORMAL HIGH (ref 70–99)
Glucose-Capillary: 137 mg/dL — ABNORMAL HIGH (ref 70–99)
Glucose-Capillary: 172 mg/dL — ABNORMAL HIGH (ref 70–99)

## 2019-10-25 LAB — CBC
HCT: 24.4 % — ABNORMAL LOW (ref 36.0–46.0)
Hemoglobin: 8.1 g/dL — ABNORMAL LOW (ref 12.0–15.0)
MCH: 32 pg (ref 26.0–34.0)
MCHC: 33.2 g/dL (ref 30.0–36.0)
MCV: 96.4 fL (ref 80.0–100.0)
Platelets: 246 10*3/uL (ref 150–400)
RBC: 2.53 MIL/uL — ABNORMAL LOW (ref 3.87–5.11)
RDW: 19.9 % — ABNORMAL HIGH (ref 11.5–15.5)
WBC: 14.3 10*3/uL — ABNORMAL HIGH (ref 4.0–10.5)
nRBC: 0 % (ref 0.0–0.2)

## 2019-10-25 LAB — BASIC METABOLIC PANEL
Anion gap: 7 (ref 5–15)
BUN: 7 mg/dL (ref 6–20)
CO2: 22 mmol/L (ref 22–32)
Calcium: 7.1 mg/dL — ABNORMAL LOW (ref 8.9–10.3)
Chloride: 106 mmol/L (ref 98–111)
Creatinine, Ser: 0.57 mg/dL (ref 0.44–1.00)
GFR calc Af Amer: >60 mL/min (ref >60)
GFR calc non Af Amer: >60 mL/min (ref >60)
Glucose, Bld: 112 mg/dL — ABNORMAL HIGH (ref 70–99)
Potassium: 3.6 mmol/L (ref 3.5–5.1)
Sodium: 135 mmol/L (ref 135–145)

## 2019-10-25 LAB — HEPATITIS B SURFACE ANTIGEN: Hepatitis B Surface Ag: NONREACTIVE

## 2019-10-25 LAB — MAGNESIUM: Magnesium: 1.6 mg/dL — ABNORMAL LOW (ref 1.7–2.4)

## 2019-10-25 LAB — HEPATITIS B SURFACE ANTIBODY,QUALITATIVE: Hep B S Ab: NONREACTIVE

## 2019-10-25 LAB — HEPATITIS A ANTIBODY, TOTAL: hep A Total Ab: NONREACTIVE

## 2019-10-25 LAB — PHOSPHORUS: Phosphorus: 3.9 mg/dL (ref 2.5–4.6)

## 2019-10-25 MED ORDER — SODIUM CHLORIDE 0.9% FLUSH
10.0000 mL | Freq: Two times a day (BID) | INTRAVENOUS | Status: DC
  Administered 2019-10-25: 30 mL
  Administered 2019-10-26 – 2019-11-08 (×22): 10 mL
  Administered 2019-11-08: 20 mL
  Administered 2019-11-09 – 2019-11-10 (×4): 10 mL
  Administered 2019-11-11: 30 mL
  Administered 2019-11-11 – 2019-11-22 (×17): 10 mL

## 2019-10-25 MED ORDER — HEPARIN (PORCINE) 25000 UT/250ML-% IV SOLN
1000.0000 [IU]/h | INTRAVENOUS | Status: DC
  Administered 2019-10-25: 1100 [IU]/h via INTRAVENOUS
  Administered 2019-10-26: 1300 [IU]/h via INTRAVENOUS
  Administered 2019-10-27 – 2019-10-28 (×2): 1150 [IU]/h via INTRAVENOUS
  Administered 2019-10-29: 1050 [IU]/h via INTRAVENOUS
  Administered 2019-10-30: 1000 [IU]/h via INTRAVENOUS
  Filled 2019-10-25 (×6): qty 250

## 2019-10-25 MED ORDER — SODIUM CHLORIDE 0.9 % IV SOLN
INTRAVENOUS | Status: DC | PRN
  Administered 2019-10-25 – 2019-10-28 (×3): 250 mL via INTRAVENOUS

## 2019-10-25 MED ORDER — INSULIN ASPART 100 UNIT/ML ~~LOC~~ SOLN
0.0000 [IU] | SUBCUTANEOUS | Status: AC
  Administered 2019-10-25: 1 [IU] via SUBCUTANEOUS
  Administered 2019-10-26: 2 [IU] via SUBCUTANEOUS
  Administered 2019-10-26: 3 [IU] via SUBCUTANEOUS
  Administered 2019-10-26 (×3): 2 [IU] via SUBCUTANEOUS

## 2019-10-25 MED ORDER — NOREPINEPHRINE 4 MG/250ML-% IV SOLN
0.0000 ug/min | INTRAVENOUS | Status: DC
  Administered 2019-10-25: 14 ug/min via INTRAVENOUS
  Administered 2019-10-26: 12 ug/min via INTRAVENOUS
  Administered 2019-10-26 (×2): 15 ug/min via INTRAVENOUS
  Filled 2019-10-25 (×6): qty 250

## 2019-10-25 MED ORDER — HEPARIN (PORCINE) 25000 UT/250ML-% IV SOLN: 1100.0000 [IU]/h | INTRAVENOUS | Status: DC

## 2019-10-25 MED ORDER — TRAVASOL 10 % IV SOLN
INTRAVENOUS | Status: AC
  Filled 2019-10-25: qty 480

## 2019-10-25 MED ORDER — SODIUM CHLORIDE 0.9% FLUSH: 10.0000 mL | INTRAVENOUS | Status: DC | PRN

## 2019-10-25 MED ORDER — BOOST / RESOURCE BREEZE PO LIQD CUSTOM
1.0000 | Freq: Three times a day (TID) | ORAL | Status: DC
  Administered 2019-10-25 – 2019-10-29 (×9): 1 via ORAL

## 2019-10-25 NOTE — Progress Notes (Signed)
ANTICOAGULATION CONSULT NOTE - Initial Consult  Pharmacy Consult for heparin Indication: mural thrombus  No Known Allergies  Patient Measurements: Height: 5' 1"  (154.9 cm) Weight: 77 kg (169 lb 12.1 oz) IBW/kg (Calculated) : 47.8 Heparin Dosing Weight: 64.9kg  Vital Signs: Temp: 98.2 F (36.8 C) (07/25 1200) Temp Source: Oral (07/25 1200) BP: 95/57 (07/25 1315) Pulse Rate: 78 (07/25 1315)  Labs: Recent Labs    10/22/19 1949 10/22/19 1949 10/23/19 1800 10/24/19 0142 10/25/19 1131  HGB 10.7*   < >  --  10.2* 8.1*  HCT 32.0*  --   --  30.0* 24.4*  PLT 302  --   --   --  246  CREATININE 0.46  --   --  0.30* 0.57  TROPONINIHS  --   --  4  --   --    < > = values in this interval not displayed.    Estimated Creatinine Clearance: 75.5 mL/min (by C-G formula based on SCr of 0.57 mg/dL).   Medical History: Past Medical History:  Diagnosis Date  . Diverticul disease small and large intestine, no perforati or abscess   . Diverticulitis of colon   . Hypertension     Medications:  Infusions:  . heparin    . methocarbamol (ROBAXIN) IV    . norepinephrine (LEVOPHED) Adult infusion 10 mcg/min (10/25/19 1300)  . piperacillin-tazobactam (ZOSYN)  IV Stopped (10/25/19 0935)  . TPN ADULT (ION)      Assessment: 2 yof found to have a mural thrombus to start IV heparin for anticoagulation. Hgb is low at 8.1 and platelets are WNL. She is not on anticoagulation PTA.   Goal of Therapy:  Heparin level 0.3-0.7 units/ml Monitor platelets by anticoagulation protocol: Yes   Plan:  Heparin gtt 1100 units/hr - NO BOLUS d/t recent surgery Check an 8 hr heparin level Daily heparin level and CBC  Natasha Barrera, Rande Lawman 10/25/2019,1:52 PM

## 2019-10-25 NOTE — Progress Notes (Signed)
Patient removed NG. Dr Grandville Silos notified. Ordered to not replace the NG at this time. RN to continue to monitor.

## 2019-10-25 NOTE — Progress Notes (Signed)
PHARMACY - TOTAL PARENTERAL NUTRITION CONSULT NOTE   Indication: Prolonged ileus  Patient Measurements: Height: 5' 1"  (154.9 cm) Weight: 77 kg (169 lb 12.1 oz) IBW/kg (Calculated) : 47.8 TPN AdjBW (KG): 55.1 Body mass index is 32.07 kg/m.  Assessment:  72 yof with remote history of sigmoid colectomy and recent admission d/t colitis initially presented with lower extremity edema and later developed abdominal pain with decreased appetite. S/p EGD + colonoscopy on 7/21 that showed Crohn's colitis. On 7/23 a CT showed large amount pneumoperitoneum and patients underwent exlap with partial colectomy and colostomy. To start TPN for anticipated prolonged ileus.   Glucose / Insulin: AM gluc ok, no hx noted Electrolytes: Na 132, others WNL as of 7/24 Renal: SCr 0.3, BUN 9 LFTs / TGs: LFTs WNL Prealbumin / albumin: prealbumin <5, albumin 1.5 Intake / Output; MIVF: 353m drain output, 5560mNG output (now removed), UOP 0.3m68mg/hr GI Imaging: 7/23 Abd xray - ileus vs SBO 7/23 CT abd - Extensive pneumoperitoneum, most consistent with colonic perforation given the persistent diffuse colitis Surgeries / Procedures:  7/21 Colonoscopy/EGD 7/24 Exlap, partial colectomy and colostomy  Central access: PICC placed 7/25 TPN start date: 7/25  Nutritional Goals (per RD recommendation on 7/22): kCal: 1700-1900, Protein: 90-105g, Fluid: >1.7L  Goal TPN rate is 75 mL/hr (provides 90 g of protein and 1849 kcals per day)  Current Nutrition:  Clear liquids and Oral supplements  Plan:  Start TPN at 60m59m at 1800 - provides 48g protein and 986kcal per day Electrolytes in TPN: 50mE41mof Na, 50mEq70mf K, 3mEq/L80m Ca, 3mEq/L 24mMg, and 13mmol/L84mPhos. Cl:Ac ratio 1:1 Add standard MVI and trace elements to TPN Initiate Sensitive q4h SSI and adjust as needed  Monitor TPN labs on Mon/Thurs F/u further RD recommendations  Natasha Barrera LyRande Lawman1,10:07 AM

## 2019-10-25 NOTE — Consult Note (Signed)
Hospital Consult    Reason for Consult:  Infra-renal aortic thrombus Referring Physician:  Medicine MRN #:  [id number]  History of Present Illness: This is a 54 y.o. female with HTN, diverticulitis s/p colectomy, and now newly diagnosed Crohn's colitis that underwent emergent partial colectomy and colostomy yesterday and vascular has been consulted for thrombus noted in the infrarenal aorta on admission CT.  Patient recently underwent endoscopy with GI and their notes indicate favor Crohn's colitis based on evaluation and a new diagnosis for this admission.  She had pneumoperitoneum and severe abdominal pain that she developed on Friday night that prompted surgical intervention.  She remains in ICU on Levophed drip but is extubated.  She denies any previous thromboembolic events.  No lower extremity pain or numbness or weakness.  Feels overwhelmed with all that has happened.  Past Medical History:  Diagnosis Date  . Diverticul disease small and large intestine, no perforati or abscess   . Diverticulitis of colon   . Hypertension     Past Surgical History:  Procedure Laterality Date  . BIOPSY  10/21/2019   Procedure: BIOPSY;  Surgeon: Irene Shipper, MD;  Location[facility] ENDOSCOPY;  Service: Endoscopy;;  . COLON SURGERY  12/25/2011  . COLONOSCOPY    . COLONOSCOPY WITH PROPOFOL N/A 10/21/2019   Procedure: COLONOSCOPY WITH PROPOFOL;  Surgeon: Irene Shipper, MD;  Location: [facility] ENDOSCOPY;  Service: Endoscopy;  Laterality: N/A;  . COLOSTOMY Left 10/24/2019   Procedure: COLOSTOMY;  Surgeon: Clovis Riley, MD;  Location: [place];  Service: General;  Laterality: Left;  . ESOPHAGOGASTRODUODENOSCOPY (EGD) WITH PROPOFOL N/A 10/21/2019   Procedure: ESOPHAGOGASTRODUODENOSCOPY (EGD) WITH PROPOFOL;  Surgeon: Irene Shipper, MD;  Location: [facility] ENDOSCOPY;  Service: Endoscopy;  Laterality: N/A;  with small bowel BX's  . [place]  2002  . LAPAROTOMY N/A 10/24/2019   Procedure: EXPLORATORY LAPAROTOMY;   Surgeon: Clovis Riley, MD;  Location: [place];  Service: General;  Laterality: N/A;  . VENTRAL HERNIA REPAIR N/A 12/05/2018   Procedure: LAPAROSCOPIC VENTRAL HERNIA REPAIR WITH MESH;  Surgeon: Jovita Kussmaul, MD;  Location: [place];  Service: General;  Laterality: N/A;    No Known Allergies  Prior to Admission medications   Medication Sig Start Date End Date Taking? Authorizing Provider  Calcium Carb-Cholecalciferol (CALCIUM+D3 PO) Take 1 tablet by mouth daily.   Yes [provider]  Cholecalciferol (VITAMIN D-3) 125 MCG (5000 UT) TABS Take 5,000 Units by mouth daily.   Yes [provider]  loperamide (IMODIUM A-D) 2 MG tablet Take 1 tablet (2 mg total) by mouth 4 (four) times daily as needed for diarrhea or loose stools. 09/20/19  Yes Maudie Mercury, MD  Multiple Vitamin (MULTIVITAMIN WITH MINERALS) TABS tablet Take 1 tablet by mouth daily. Centrum Silver   Yes [provider]  potassium chloride (KLOR-CON) 10 MEQ tablet Take 2 tablets (20 mEq total) by mouth daily. Patient not taking: Reported on 10/18/2019 09/20/19   Maudie Mercury, MD    Social History   Socioeconomic History  . Marital status: Married    Spouse name: Not on file  . Number of children: Not on file  . Years of education: Not on file  . Highest education level: Not on file  Occupational History  . Not on file  Tobacco Use  . Smoking status: Former Research scientist (life sciences)  . Smokeless tobacco: Never Used  Vaping Use  . Vaping Use: Never used  Substance and Sexual Activity  . Alcohol use: Yes  Comment: occasional  . Drug use: No  . Sexual activity: Not on file  Other Topics Concern  . Not on file  Social History Narrative  . Not on file   Social Determinants of Health   Financial Resource Strain:   . Difficulty of Paying Living Expenses:   Food Insecurity:   . Worried About Charity fundraiser in the Last Year:   . Arboriculturist in the Last Year:   Transportation Needs:   . Lexicographer (Medical):   Marland Kitchen Lack of Transportation (Non-Medical):   Physical Activity:   . Days of Exercise per Week:   . Minutes of Exercise per Session:   Stress:   . Feeling of Stress :   Social Connections:   . Frequency of Communication with Friends and Family:   . Frequency of Social Gatherings with Friends and Family:   . Attends Religious Services:   . Active Member of Clubs or Organizations:   . Attends Archivist Meetings:   Marland Kitchen Marital Status:   Intimate Partner Violence:   . Fear of Current or Ex-Partner:   . Emotionally Abused:   Marland Kitchen Physically Abused:   . Sexually Abused:      Family History  Problem Relation Age of Onset  . Breast cancer Neg Hx     ROS: [x]  Positive   [ ]  Negative   [ ]  All sytems reviewed and are negative  Cardiovascular: []  chest pain/pressure []  palpitations []  SOB lying flat []  DOE []  pain in legs while walking []  pain in legs at rest []  pain in legs at night []  non-healing ulcers []  hx of DVT [x]  swelling in legs  Pulmonary: []  productive cough []  asthma/wheezing []  home O2  Neurologic: []  weakness in []  arms []  legs []  numbness in []  arms []  legs []  hx of CVA []  mini stroke [] difficulty speaking or slurred speech []  temporary loss of vision in one eye []  dizziness  Hematologic: []  hx of cancer []  bleeding problems []  problems with blood clotting easily  Endocrine:   []  diabetes []  thyroid disease  GI []  vomiting blood []  blood in stool  GU: []  CKD/renal failure []  HD--[]  M/W/F or []  T/T/S []  burning with urination []  blood in urine  Psychiatric: []  anxiety []  depression  Musculoskeletal: []  arthritis []  joint pain  Integumentary: []  rashes []  ulcers  Constitutional: []  fever []  chills   Physical Examination  Vitals:   10/25/19 1300 10/25/19 1315  BP: (!) 94/56 (!) 95/57  Pulse: 84 78  Resp: 17 19  Temp:    SpO2: 95% 95%   Body mass index is 32.07 kg/m.  General:  WDWN in  NAD Gait: Not observed HENT: WNL, normocephalic Pulmonary: normal non-labored breathing, without Rales, rhonchi,  wheezing Cardiac: regular, without  Murmurs, rubs or gallops Abdomen: soft, NT/ND, no masses Vascular Exam/Pulses: Palpable radial pulses bilaterally Palpable femoral pulses bilaterally Palpable DP pulses bilaterally Significant lower extremity edema Extremities: without ischemic changes, without Gangrene , without cellulitis; without open wounds;  Musculoskeletal: no muscle wasting or atrophy  Neurologic: A&O X 3; Appropriate Affect ; SENSATION: normal; MOTOR FUNCTION:  moving all extremities equally. Speech is fluent/normal   CBC    Component Value Date/Time   WBC 14.3 (H) 10/25/2019 1131   RBC 2.53 (L) 10/25/2019 1131   HGB 8.1 (L) 10/25/2019 1131   HCT 24.4 (L) 10/25/2019 1131   PLT 246 10/25/2019 1131   MCV 96.4 10/25/2019 1131  MCH 32.0 10/25/2019 1131   MCHC 33.2 10/25/2019 1131   RDW 19.9 (H) 10/25/2019 1131   LYMPHSABS 0.6 (L) 10/21/2019 2204   MONOABS 0.1 10/21/2019 2204   EOSABS 0.0 10/21/2019 2204   BASOSABS 0.0 10/21/2019 2204    BMET    Component Value Date/Time   NA 135 10/25/2019 1131   K 3.6 10/25/2019 1131   CL 106 10/25/2019 1131   CO2 22 10/25/2019 1131   GLUCOSE 112 (H) 10/25/2019 1131   BUN 7 10/25/2019 1131   CREATININE 0.57 10/25/2019 1131   CALCIUM 7.1 (L) 10/25/2019 1131   GFRNONAA >60 10/25/2019 1131   GFRAA >60 10/25/2019 1131    COAGS: Lab Results  Component Value Date   INR 1.13 10/25/2011     Non-Invasive Vascular Imaging:    CT abdomen pelvis 10/23/2019 shows apparent thrombus in the infrarenal aorta with less than 50% luminal involvement with underlying calcified aortoiliac disease.  This thrombus appears acute and was not present on CT abdomen pelvis from 10/02/19.   ASSESSMENT/PLAN: This is a 54 y.o. female with newly diagnosed Crohn's disease and now status post laparotomy with colectomy and colostomy that  vascular surgery has been consulted for thrombus in the infrarenal aorta noted on admission CT.  In review of CT this appears new on the CT from 10/23/2019 and was not apparent on the CT from 10/02/19.  She has no signs of atheroembolic event or ischemia at this time and has easily palpable dorsalis pedis pulses in both feet and is motor sensory intact.  I recommended anticoagulation with heparin without bolus if ok with GI and general surgery.  Did discuss bleeding risk with her family.  She does have some underlying atherosclerosis of her aortoiliac segment with calcifications but the thrombus looks relatively acute on CT and not sure if this could be related to her hypercoagulability from newly diagnosed Crohn's disease.  We will likely order interval CT scan at some point this admission.   Marty Heck, MD [facility] Office: [place]

## 2019-10-25 NOTE — Progress Notes (Signed)
1 Day Post-Op   Subjective/Chief Complaint: Pain with moving, some hunger   Objective: Vital signs in last 24 hours: Temp:  [96.6 F (35.9 C)-98.4 F (36.9 C)] 98.4 F (36.9 C) (07/25 0800) Pulse Rate:  [70-110] 87 (07/25 0930) Resp:  [10-26] 18 (07/25 0930) BP: (78-101)/(51-70) 90/56 (07/25 0930) SpO2:  [95 %-100 %] 99 % (07/25 0930) Last BM Date: 10/23/19  Intake/Output from previous day: 07/24 0701 - 07/25 0700 In: 6377.8 [I.V.:2614.4; IV Piggyback:3563.4] Out: 3710 [Urine:875; Emesis/NG output:550; Drains:360] Intake/Output this shift: Total I/O In: 964.2 [I.V.:921.8; IV Piggyback:42.5] Out: 200 [Urine:125; Drains:75]  General appearance: alert and cooperative Resp: clear to auscultation bilaterally Cardio: regular rate and rhythm GI: soft, wound clean, ostomy pink with sweat in bag. Drain output turbid serous fluid Extremities: anasarca  Lab Results:  Recent Labs    10/22/19 1949 10/24/19 0142  WBC 8.0  --   HGB 10.7* 10.2*  HCT 32.0* 30.0*  PLT 302  --    BMET Recent Labs    10/22/19 1949 10/24/19 0142  NA 130* 132*  K 4.6 4.0  CL 103 98  CO2 20*  --   GLUCOSE 115* 112*  BUN 7 9  CREATININE 0.46 0.30*  CALCIUM 7.5*  --    PT/INR No results for input(s): LABPROT, INR in the last 72 hours. ABG No results for input(s): PHART, HCO3 in the last 72 hours.  Invalid input(s): PCO2, PO2  Studies/Results: DG Abd 1 View  Result Date: 10/23/2019 CLINICAL DATA:  Abdominal pain and diarrhea. EXAM: ABDOMEN - 1 VIEW COMPARISON:  None. FINDINGS: A single mildly dilated small bowel is seen overlying the left upper quadrant. There is no evidence of free air. No radio-opaque calculi or other significant radiographic abnormality are seen. IMPRESSION: Single mildly dilated small bowel overlying the left upper quadrant. This may represent focal ileus or early small bowel obstruction. Correlation with abdomen pelvis CT is recommended. Electronically Signed   By:  Virgina Norfolk M.D.   On: 10/23/2019 17:03   CT ABDOMEN PELVIS W CONTRAST  Result Date: 10/23/2019 CLINICAL DATA:  Abdominal pain, diarrhea EXAM: CT ABDOMEN AND PELVIS WITH CONTRAST TECHNIQUE: Multidetector CT imaging of the abdomen and pelvis was performed using the standard protocol following bolus administration of intravenous contrast. CONTRAST:  121m OMNIPAQUE IOHEXOL 350 MG/ML SOLN COMPARISON:  10/02/2019, 10/23/2019 FINDINGS: Lower chest: There are small bilateral pleural effusions, with minimal dependent lower lobe atelectasis. Hepatobiliary: Diffuse hepatic steatosis without focal liver abnormality. The gallbladder is unremarkable. Pancreas: Unremarkable. No pancreatic ductal dilatation or surrounding inflammatory changes. Spleen: Normal in size without focal abnormality. Adrenals/Urinary Tract: Adrenal glands are unremarkable. Kidneys are normal, without renal calculi, focal lesion, or hydronephrosis. Bladder is unremarkable. Stomach/Bowel: There is marked diffuse colonic wall thickening most pronounced from the cecum through the splenic flexure, compatible with inflammatory or infectious colitis. There is extensive pneumoperitoneum, most likely from colonic perforation. Surgical consultation is recommended. No evidence of small-bowel obstruction. Oral contrast has progressed into the colon by the time of the exam. Vascular/Lymphatic: There is marked atherosclerosis of the aorta. Since the previous exam, focal mural thrombus has developed within the infrarenal aorta, reference images 27 through 31, with less than 50% narrowing. No pathologic adenopathy. Reproductive: Uterus and bilateral adnexa are unremarkable. Other: Extensive pneumoperitoneum is seen throughout the upper abdomen most consistent with colonic perforation. Small amount of free fluid is seen within the lower pelvis. There is diffuse body wall edema. Musculoskeletal: No acute or destructive bony lesions. Reconstructed  images  demonstrate no additional findings. IMPRESSION: 1. Extensive pneumoperitoneum, most consistent with colonic perforation given the persistent diffuse colitis. Surgical consultation is recommended. 2. Interval development of focal mural thrombus within the infrarenal aorta, with less than 50% narrowing. 3. Small bilateral pleural effusions. 4. Diffuse hepatic steatosis. 5. Aortic Atherosclerosis (ICD10-I70.0). These results were called by telephone at the time of interpretation on 10/23/2019 at 10:01 pm to provider Sampson Si , who verbally acknowledged these results. Electronically Signed   By: Randa Ngo M.D.   On: 10/23/2019 22:05   [place] EKG SITE RITE  Result Date: 10/24/2019 If Site Rite image not attached, placement could not be confirmed due to current cardiac rhythm.   Anti-infectives: Anti-infectives (From admission, onward)   Start     Dose/Rate Route Frequency Ordered Stop   10/24/19 0600  piperacillin-tazobactam (ZOSYN) IVPB 3.375 g     Discontinue     3.375 g 12.5 mL/hr over 240 Minutes Intravenous Every 8 hours 10/23/19 2305     10/24/19 0000  piperacillin-tazobactam (ZOSYN) IVPB 4.5 g  Status:  Discontinued        4.5 g 200 mL/hr over 30 Minutes Intravenous Every 6 hours 10/23/19 2257 10/23/19 2302   10/23/19 2315  piperacillin-tazobactam (ZOSYN) IVPB 3.375 g        3.375 g 100 mL/hr over 30 Minutes Intravenous STAT 10/23/19 2302 10/23/19 2347      Assessment/Plan: Active Crohn's colitis - S/P EGD/colonoscopy 7/21 by Dr. Henrene Pastor, per GI Sigmoid colon perforation - S/P partial colectomy and colostomy by Dr. Kae Heller 7/24. Ileus anticipated. Sips of clears today, Start TPN Sepsis - Zosyn, albumin bolus, per primary  I spoke with her husband again this morning about operative findings and the plan going forward.  LOS: 6 days    Clovis Riley 10/25/2019

## 2019-10-25 NOTE — Progress Notes (Addendum)
NAME:  Natasha Barrera, MRN:  [id number], DOB:  1966/01/29, LOS: 6 ADMISSION DATE:  10/18/2019, CONSULTATION DATE: 10/24/2019 REFERRING MD:  Dr. Philipp Ovens, CHIEF COMPLAINT: Hypertension  Brief History   54 year old female admitted with lower extremity edema and abdominal pain, work-up during admission revealed Crohn's disease confirmed with upper and lower endoscopy and pathological results.  Underwent exploratory laparotomy with lysis of adhesions due to pneumoperitoneum early morning of 7/24.  PCCM consulted for persistent hypotension afternoon of 7/24  History of present illness   Natasha Barrera is a 54 year old female with a past medical history significant for hypertension and diverticulitis (status post sigmoid colectomy 2015 ) who originally presented with complaints of lower extremity edema with later reports of abdominal pain and decreased appetite.  Patient underwent upper and lower endoscopy 7/29 with Dr. Henrene Pastor that showed findings concerning for Crohn's colitis, this was confirmed with pathological report.    Afternoon of 7/23 patient developed acute onset upper abdominal pain resulting in urgent CT of the abdomen that revealed large amount of pneumoperitoneum for which patient underwent exploratory laparotomy with lysis of adhesions and segmental colectomy with end colostomy by general surgery  Post surgery patient was seen with progressive hypotension despite IV hydration and IV albumin in the setting of severe hypoalbuminemia.  PCCM consulted for further management and possible need of vasopressors.  Past Medical History  Hypertension Diverticulitis  Significant Hospital Events   Admitted 7/18  Consults:  General surgery Gastroenterology PCCM  Procedures:  7/24 exploratory laparotomy with lysis of adhesions and segmental colectomy with end colostomy by general surgery  7/24 PICC line >  Significant Diagnostic Tests:  CT angio chest 7/18 > negative for acute PE,  emphysema  CT abdomen and pelvis 7/23 >  1.extensive pneumoperitoneum 2.  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  Small bilateral pleural effusions 4.  Diffuse hepatic steatosis  Micro Data:  Covid 7/18 > negative  C. Difficile 7/20 > negative Surgical PCR 7/23 > negative  Antimicrobials:  Zosyn 7/24 >  Interim history/subjective:  Patient removed NG tube overnight, general surgery agreed to tube with tube out Undergoing PICC line placement this morning Denies any acute complaints this morning  Objective   Blood pressure (!) 90/56, pulse 87, temperature 98.4 F (36.9 C), temperature source Oral, resp. rate 18, height 5' 1"  (1.549 m), weight 77 kg, last menstrual period 12/15/2014, SpO2 99 %.        Intake/Output Summary (Last 24 hours) at 10/25/2019 0944 Last data filed at 10/25/2019 0900 Gross per 24 hour  Intake 7236.99 ml  Output 1985 ml  Net 5251.99 ml   Filed Weights   10/18/19 1620 10/24/19 0452  Weight: 53.5 kg 77 kg    Examination: General: Acutely decompensated pale middle-aged female lying in bed in no acute distress  ventilation, in NAD HEENT: [place]/AT, MM pink/moist, PERRL, sclera nonicteric Neuro: Alert and oriented x3, nonfocal CV: s1s2 regular rate and rhythm, no murmur, rubs, or gallops,  PULM: Clear to auscultation bilaterally, no increased work of breathing GI: soft, bowel sounds hypoactive in all 4 quadrants, tender to palpation, midline abdominal incision with dressing clean dry and intact, colostomy to left upper quadrant Extremities: warm/dry, generalized 2+ pitting edema  Skin: no rashes or lesions  Resolved Hospital Problem list     Assessment & Plan:  Persistent hypotension  -Patient has been seen with persistent hypotension since admission but has progressively worsened over the clinical course.   -This is  likely secondary to severe hypoalbuminemia P: Improved with vasopressor utilization Remains on  Levophed drip Map goal greater than 65 Continue IV Solu-Medrol Once able to utilize GI tract start protein supplementation Hold any further IV hydration, currently net 12 L positive  Inflammatory colitis secondary to Crohn's disease -Patient underwent both upper and lower endoscopies 7/21 by Dr. Henrene Pastor which revealed signs of Crohn's this was confirmed with biopsy. P: Primary management per gastroenterology, appreciate assistance Continue IV steroids Hold on any further IV hydration or albumin at this time Once PICC line in place obtain TB QuantiFERON and hepatitis panel for screenings prior to immunomodulatory therapy  Pneumoperitoneum status post partial colectomy and end colostomy -Patient reported acute onset abdominal pain afternoon of 7/23 for which CT abdomen obtained.  This revealed a large pneumoperitoneum therefore general surgery was consulted and patient underwent exploratory laparotomy with lysis of adhesions and segmental colectomy with end colostomy P: Primary management per general surgery, appreciate assistance Patient removed NG tube overnight, general surgery okay to leave NG tube out at this time Initiate oral intake per general surgery recommendations If n.p.o. status is prolonged may need to consider TPN  Interval development of focal mural thrombus within the infrarenal aorta -Seen on CT ABD 7/23 P: Vascular surgery consulted  Spoke with GS and they are agreeable to anticoagulation  Patient is a risk for mucosal bleeding in the setting of her colitis  Will start heparin drip without a bolus   Anemia -Status post 2 units of packed red blood cells during admission thus far for hemoglobin drop of 6.1 P: Trend CBC Monitor for signs of bleeding Transfuse for hemoglobin less than 7 Lab work pending placement of PICC line due to difficult IV access  Hyperosmolar hyponatremic hypokalemia -Likely secondary to hypovolemia, sodium remained stable in the low  130s P: Trend bmet  Hypokalemia -Improving P: Obtain lab work once [employer] in place as above  Building surveyor:  Diet: N.p.o. Pain/Anxiety/Delirium protocol (if indicated): As needed VAP protocol (if indicated): Not applicable DVT prophylaxis: SCDs GI prophylaxis: PPI Glucose control: Monitor Mobility: Bedrest Code Status: Full code Family Communication: Patient's husband updated again at bedside 7/25 Disposition: ICU  Labs   CBC: Recent Labs  Lab 10/18/19 1649 10/18/19 1649 10/19/19 0308 10/21/19 1240 10/21/19 1247 10/21/19 1257 10/21/19 2204 10/22/19 1949 10/24/19 0142  WBC 8.6  --  5.1  --   --  4.2 4.4 8.0  --   NEUTROABS  --   --   --   --   --   --  3.7  --   --   HGB 10.4*   < > 7.9*   < > 6.1* 6.3* 10.4* 10.7* 10.2*  HCT 30.9*   < > 24.4*   < > 18.0* 19.5* 30.7* 32.0* 30.0*  MCV 101.3*  --  103.0*  --   --  103.7* 94.5 95.0  --   PLT 593*  --  395  --   --  362 353 302  --    < > = values in this interval not displayed.    Basic Metabolic Panel: Recent Labs  Lab 10/18/19 2116 10/19/19 0308 10/19/19 0328 10/19/19 0830 10/20/19 0338 10/20/19 0338 10/20/19 0928 10/20/19 0928 10/20/19 1850 10/20/19 1850 10/21/19 1240 10/21/19 1247 10/21/19 2204 10/22/19 1949 10/24/19 0142  NA  --    < >  --    < > 127*   < > 128*   < > 130*   < >  135 135 132* 130* 132*  K  --    < >  --    < > 3.8   < > 3.6   < > 3.3*   < > 2.6* 3.1* 3.8 4.6 4.0  CL  --    < >  --    < > 95*   < > 96*   < > 100  --  98  --  101 103 98  CO2  --    < >  --    < > 25  --  26  --  23  --   --   --  22 20*  --   GLUCOSE  --    < >  --    < > 92   < > 96   < > 105*  --  87  --  114* 115* 112*  BUN  --    < >  --    < > <5*   < > 5*   < > 5*  --  5*  --  6 7 9   CREATININE  --    < >  --    < > 0.47   < > 0.48   < > 0.45  --  <0.20*  --  0.43* 0.46 0.30*  CALCIUM  --    < >  --    < > 6.7*  --  6.7*  --  6.8*  --   --   --  7.3* 7.5*  --   MG 1.9  --   --   --   --   --   --   --   --    --   --   --   --   --   --   PHOS 1.5*  --  1.9*  --   --   --   --   --   --   --   --   --  4.0  --   --    < > = values in this interval not displayed.   GFR: Estimated Creatinine Clearance: 75.5 mL/min (A) (by C-G formula based on SCr of 0.3 mg/dL (L)). Recent Labs  Lab 10/19/19 0308 10/21/19 1257 10/21/19 2204 10/22/19 1949 10/23/19 2239  WBC 5.1 4.2 4.4 8.0  --   LATICACIDVEN  --   --   --   --  2.2*    Liver Function Tests: Recent Labs  Lab 10/18/19 2116 10/19/19 0308 10/22/19 1949  AST 15 13* 18  ALT 14 11 16   ALKPHOS 91 81 88  BILITOT 1.3* 1.4* 1.0  PROT 4.4* 3.7* 4.4*  ALBUMIN 1.0* <1.0* 1.5*   No results for input(s): LIPASE, AMYLASE in the last 168 hours. No results for input(s): AMMONIA in the last 168 hours.  ABG    Component Value Date/Time   PHART 7.547 (H) 10/21/2019 1247   PCO2ART 27.0 (L) 10/21/2019 1247   PO2ART 106 10/21/2019 1247   HCO3 23.6 10/21/2019 1247   TCO2 22 10/24/2019 0142   O2SAT 99.0 10/21/2019 1247     Coagulation Profile: No results for input(s): INR, PROTIME in the last 168 hours.  Cardiac Enzymes: No results for input(s): CKTOTAL, CKMB, CKMBINDEX, TROPONINI in the last 168 hours.  HbA1C: No results found for: HGBA1C  CBG: No results for input(s): GLUCAP in the last 168 hours.  Critical care time:   Performed by: Johnsie Cancel  Total critical care time: 35 minutes  Critical care time was exclusive of separately billable procedures and treating other patients.  Critical care was necessary to treat or prevent imminent or life-threatening deterioration.  Critical care was time spent personally by me on the following activities: development of treatment plan with patient and/or surrogate as well as nursing, discussions with consultants, evaluation of patient's response to treatment, examination of patient, obtaining history from patient or surrogate, ordering and performing treatments and interventions, ordering and  review of laboratory studies, ordering and review of radiographic studies, pulse oximetry and re-evaluation of patient's condition.  Johnsie Cancel, NP-C [facility] Contact / Pager information can be found on Amion  10/25/2019, 9:44 AM

## 2019-10-25 NOTE — Progress Notes (Signed)
Requested by Anderson Malta RN to place PIV for Heparin infusion.  Assessed BUE.  PICC in [place], wound on LAFA reddened area covered in guaze, LA edematous and weeping to mild touch.  Ultrasound used on L upper arm.  Pt has had 2 midlines this month in the left basilic vein, most recent midline removed earlier today.  Most recent midline had Levophed infusing and no blood return per RN.  Ultrasound view of basilic noted to be irregular shaped, noncompressible and areas of echogenicity.  Brachial veins small and directly under a potential nerve bundle, cephalic vein smaller than the 22G indicator. RA also with edema and weeping.  Any PIV increases risk of infection due to poor skin integrity and weeping.  Recommend infusing Heparin via PICC or SQ Heparin or Lovenox.  Also assessed by Raynelle Fanning VAS T RN with ultrasound. Anderson Malta RN and Marissa CN RN aware and also viewed ultrasound with IV RN's.

## 2019-10-25 NOTE — Progress Notes (Signed)
Peripherally Inserted Central Catheter Placement  The IV Nurse has discussed with the patient and/or persons authorized to consent for the patient, the purpose of this procedure and the potential benefits and risks involved with this procedure.  The benefits include less needle sticks, lab draws from the catheter, and the patient may be discharged home with the catheter. Risks include, but not limited to, infection, bleeding, blood clot (thrombus formation), and puncture of an artery; nerve damage and irregular heartbeat and possibility to perform a PICC exchange if needed/ordered by physician.  Alternatives to this procedure were also discussed.  Bard Power PICC patient education guide, fact sheet on infection prevention and patient information card has been provided to patient /or left at bedside. Husband at bedside during consent and was signed per patient's request.     PICC Placement Documentation  PICC Triple Lumen 57/97/28 PICC Right Basilic 36 cm 0 cm (Active)  Indication for Insertion or Continuance of Line Administration of hyperosmolar/irritating solutions (i.e. TPN, Vancomycin, etc.) 10/25/19 1100  Exposed Catheter (cm) 0 cm 10/25/19 1100  Site Assessment Clean;Dry;Intact 10/25/19 1100  Lumen #1 Status Flushed;Blood return noted;Saline locked 10/25/19 1100  Lumen #2 Status Flushed;Blood return noted;Saline locked 10/25/19 1100  Lumen #3 Status Flushed;Blood return noted;Saline locked 10/25/19 1100  Dressing Type Transparent 10/25/19 1100  Dressing Status Clean;Dry;Intact;Antimicrobial disc in place 10/25/19 1100  Safety Lock Not Applicable 20/60/15 6153  Line Care Connections checked and tightened 10/25/19 1100  Line Adjustment (NICU/IV Team Only) No 10/25/19 1100  Dressing Intervention New dressing 10/25/19 1100  Dressing Change Due 11/01/19 10/25/19 [place], Keenan Bachelor 10/25/2019, 11:16 AM

## 2019-10-25 NOTE — Progress Notes (Addendum)
Patient ID: Natasha Barrera, female   DOB: December 08, 1965, 54 y.o.   MRN: [id number]    Progress Note   Subjective   Day # 7  CC: severe colitis Day # 1 post op -partial colectomy and colostomy for colon perforation  NG tube has been removed, patient doing well postop PICC line has been placed Continues on Zosyn   Objective   Vital signs in last 24 hours: Temp:  [97.4 F (36.3 C)-98.4 F (36.9 C)] 98.2 F (36.8 C) (07/25 1200) Pulse Rate:  [70-110] 80 (07/25 1230) Resp:  [10-26] 18 (07/25 1230) BP: (73-105)/(51-67) 92/63 (07/25 1230) SpO2:  [95 %-100 %] 97 % (07/25 1230) Last BM Date: 10/23/19 General:    [place] female in NAD Heart:  Regular rate and rhythm; no murmurs Lungs: Respirations even and unlabored, lungs CTA bilaterally Abdomen:  Soft,   Extremities: Upper and lower extremity edema with some weeping grossly normal neurologically. Psych:  Cooperative. Normal mood and affect.  Intake/Output from previous day: 07/24 0701 - 07/25 0700 In: 6377.8 [I.V.:2614.4; IV Piggyback:3563.4] Out: 7416 [Urine:875; Emesis/NG output:550; Drains:360] Intake/Output this shift: Total I/O In: 964.2 [I.V.:921.8; IV Piggyback:42.5] Out: 200 [Urine:125; Drains:75]  Lab Results: Recent Labs    10/22/19 1949 10/24/19 0142 10/25/19 1131  WBC 8.0  --  14.3*  HGB 10.7* 10.2* 8.1*  HCT 32.0* 30.0* 24.4*  PLT 302  --  246   BMET Recent Labs    10/22/19 1949 10/24/19 0142 10/25/19 1131  NA 130* 132* 135  K 4.6 4.0 3.6  CL 103 98 106  CO2 20*  --  22  GLUCOSE 115* 112* 112*  BUN 7 9 7   CREATININE 0.46 0.30* 0.57  CALCIUM 7.5*  --  7.1*   LFT Recent Labs    10/22/19 1949  PROT 4.4*  ALBUMIN 1.5*  AST 18  ALT 16  ALKPHOS 88  BILITOT 1.0   PT/INR No results for input(s): LABPROT, INR in the last 72 hours.  Studies/Results: DG Abd 1 View  Result Date: 10/23/2019 CLINICAL DATA:  Abdominal pain and diarrhea. EXAM: ABDOMEN - 1 VIEW COMPARISON:  None. FINDINGS: A single  mildly dilated small bowel is seen overlying the left upper quadrant. There is no evidence of free air. No radio-opaque calculi or other significant radiographic abnormality are seen. IMPRESSION: Single mildly dilated small bowel overlying the left upper quadrant. This may represent focal ileus or early small bowel obstruction. Correlation with abdomen pelvis CT is recommended. Electronically Signed   By: Virgina Norfolk M.D.   On: 10/23/2019 17:03   CT ABDOMEN PELVIS W CONTRAST  Result Date: 10/23/2019 CLINICAL DATA:  Abdominal pain, diarrhea EXAM: CT ABDOMEN AND PELVIS WITH CONTRAST TECHNIQUE: Multidetector CT imaging of the abdomen and pelvis was performed using the standard protocol following bolus administration of intravenous contrast. CONTRAST:  167m OMNIPAQUE IOHEXOL 350 MG/ML SOLN COMPARISON:  10/02/2019, 10/23/2019 FINDINGS: Lower chest: There are small bilateral pleural effusions, with minimal dependent lower lobe atelectasis. Hepatobiliary: Diffuse hepatic steatosis without focal liver abnormality. The gallbladder is unremarkable. Pancreas: Unremarkable. No pancreatic ductal dilatation or surrounding inflammatory changes. Spleen: Normal in size without focal abnormality. Adrenals/Urinary Tract: Adrenal glands are unremarkable. Kidneys are normal, without renal calculi, focal lesion, or hydronephrosis. Bladder is unremarkable. Stomach/Bowel: There is marked diffuse colonic wall thickening most pronounced from the cecum through the splenic flexure, compatible with inflammatory or infectious colitis. There is extensive pneumoperitoneum, most likely from colonic perforation. Surgical consultation is recommended. No evidence of small-bowel  obstruction. Oral contrast has progressed into the colon by the time of the exam. Vascular/Lymphatic: There is marked atherosclerosis of the aorta. Since the previous exam, focal mural thrombus has developed within the infrarenal aorta, reference images 27 through  31, with less than 50% narrowing. No pathologic adenopathy. Reproductive: Uterus and bilateral adnexa are unremarkable. Other: Extensive pneumoperitoneum is seen throughout the upper abdomen most consistent with colonic perforation. Small amount of free fluid is seen within the lower pelvis. There is diffuse body wall edema. Musculoskeletal: No acute or destructive bony lesions. Reconstructed images demonstrate no additional findings. IMPRESSION: 1. Extensive pneumoperitoneum, most consistent with colonic perforation given the persistent diffuse colitis. Surgical consultation is recommended. 2. Interval development of focal mural thrombus within the infrarenal aorta, with less than 50% narrowing. 3. Small bilateral pleural effusions. 4. Diffuse hepatic steatosis. 5. Aortic Atherosclerosis (ICD10-I70.0). These results were called by telephone at the time of interpretation on 10/23/2019 at 10:01 pm to provider Natasha Barrera , who verbally acknowledged these results. Electronically Signed   By: Randa Ngo M.D.   On: 10/23/2019 22:05   [place] EKG SITE RITE  Result Date: 10/24/2019 If Site Rite image not attached, placement could not be confirmed due to current cardiac rhythm.      Assessment / Plan:    #33 54 year old [name] female with severe pancolitis found on colonoscopy 10/21/2019.  She had been started on Solu-Medrol, appearance favoring Crohn's. Patient developed bowel perforation on 10/23/2019 and required emergent partial colectomy and colostomy.  She is stable postop Continues on IV Zosyn. Solu-Medrol has been on hold  #2 anemia multifactorial-stable after transfusion x2 #3Persistent hypotension felt secondary to severe hypoalbuminemia #4 hyperosmolar hyponatremic hypovolemia-sodium has normalized  #5 nutrition-patient has had poor oral intake for multiple weeks and with severe hypoalbuminemia feel she will benefit from TPN.  PICC line has been placed, start TPN  Management currently as per  surgery, will discuss with team and surgeons regarding timing of potential restart of steroids.  Ultimately when she has recovered from partial colectomy, will need to initiate biologic therapy.  GI will continue to follow   LOS: 6 days   Amy EsterwoodPA-C  10/25/2019, 1:10 PM   GI ATTENDING  Interval history data reviewed.  Agree with interval progress note.  The patient is stable.  NG tube.  Ongoing routine postop management.  Agree with plans for parenteral nutrition.  Particular timing and type of treatment for severe IBD to be determined.  Will follow.  Docia Chuck. Geri Semin[place]., M.D. [facility] Division of Gastroenterology

## 2019-10-25 NOTE — Plan of Care (Signed)
  Problem: Pain Managment: Goal: General experience of comfort will improve Outcome: Progressing   

## 2019-10-26 ENCOUNTER — Inpatient Hospital Stay (HOSPITAL_COMMUNITY): Payer: 59

## 2019-10-26 DIAGNOSIS — E440 Moderate protein-calorie malnutrition: Secondary | ICD-10-CM

## 2019-10-26 DIAGNOSIS — K50118 Crohn's disease of large intestine with other complication: Secondary | ICD-10-CM

## 2019-10-26 DIAGNOSIS — R0602 Shortness of breath: Secondary | ICD-10-CM

## 2019-10-26 DIAGNOSIS — I7419 Embolism and thrombosis of other parts of aorta: Secondary | ICD-10-CM | POA: Diagnosis not present

## 2019-10-26 LAB — CBC
HCT: 22.6 % — ABNORMAL LOW (ref 36.0–46.0)
Hemoglobin: 7.3 g/dL — ABNORMAL LOW (ref 12.0–15.0)
MCH: 32.3 pg (ref 26.0–34.0)
MCHC: 32.3 g/dL (ref 30.0–36.0)
MCV: 100 fL (ref 80.0–100.0)
Platelets: 223 10*3/uL (ref 150–400)
RBC: 2.26 MIL/uL — ABNORMAL LOW (ref 3.87–5.11)
RDW: 20 % — ABNORMAL HIGH (ref 11.5–15.5)
WBC: 11.4 10*3/uL — ABNORMAL HIGH (ref 4.0–10.5)
nRBC: 0 % (ref 0.0–0.2)

## 2019-10-26 LAB — COMPREHENSIVE METABOLIC PANEL
ALT: 12 U/L (ref 0–44)
AST: 13 U/L — ABNORMAL LOW (ref 15–41)
Albumin: 1.1 g/dL — ABNORMAL LOW (ref 3.5–5.0)
Alkaline Phosphatase: 72 U/L (ref 38–126)
Anion gap: 4 — ABNORMAL LOW (ref 5–15)
BUN: 5 mg/dL — ABNORMAL LOW (ref 6–20)
CO2: 25 mmol/L (ref 22–32)
Calcium: 6.8 mg/dL — ABNORMAL LOW (ref 8.9–10.3)
Chloride: 104 mmol/L (ref 98–111)
Creatinine, Ser: 0.52 mg/dL (ref 0.44–1.00)
GFR calc Af Amer: >60 mL/min (ref >60)
GFR calc non Af Amer: >60 mL/min (ref >60)
Glucose, Bld: 414 mg/dL — ABNORMAL HIGH (ref 70–99)
Potassium: 4.3 mmol/L (ref 3.5–5.1)
Sodium: 133 mmol/L — ABNORMAL LOW (ref 135–145)
Total Bilirubin: 0.3 mg/dL (ref 0.3–1.2)
Total Protein: 3.1 g/dL — ABNORMAL LOW (ref 6.5–8.1)

## 2019-10-26 LAB — DIFFERENTIAL
Abs Immature Granulocytes: 0.2 10*3/uL — ABNORMAL HIGH (ref 0.00–0.07)
Basophils Absolute: 0 10*3/uL (ref 0.0–0.1)
Basophils Relative: 0 %
Eosinophils Absolute: 0 10*3/uL (ref 0.0–0.5)
Eosinophils Relative: 0 %
Immature Granulocytes: 2 %
Lymphocytes Relative: 9 %
Lymphs Abs: 1 10*3/uL (ref 0.7–4.0)
Monocytes Absolute: 0.3 10*3/uL (ref 0.1–1.0)
Monocytes Relative: 2 %
Neutro Abs: 10.3 10*3/uL — ABNORMAL HIGH (ref 1.7–7.7)
Neutrophils Relative %: 87 %

## 2019-10-26 LAB — GLUCOSE, CAPILLARY
Glucose-Capillary: 185 mg/dL — ABNORMAL HIGH (ref 70–99)
Glucose-Capillary: 174 mg/dL — ABNORMAL HIGH (ref 70–99)
Glucose-Capillary: 188 mg/dL — ABNORMAL HIGH (ref 70–99)
Glucose-Capillary: 209 mg/dL — ABNORMAL HIGH (ref 70–99)
Glucose-Capillary: 212 mg/dL — ABNORMAL HIGH (ref 70–99)
Glucose-Capillary: 188 mg/dL — ABNORMAL HIGH (ref 70–99)

## 2019-10-26 LAB — PREALBUMIN: Prealbumin: <5 mg/dL — ABNORMAL LOW (ref 18–38)

## 2019-10-26 LAB — ECHOCARDIOGRAM COMPLETE
Area-P 1/2: 3.46 cm2
Height: 61 in
S' Lateral: 3.4 cm
Weight: 2716.07 oz

## 2019-10-26 LAB — HEPARIN LEVEL (UNFRACTIONATED)
Heparin Unfractionated: 0.18 IU/mL — ABNORMAL LOW (ref 0.30–0.70)
Heparin Unfractionated: 0.39 IU/mL (ref 0.30–0.70)
Heparin Unfractionated: 0.57 IU/mL (ref 0.30–0.70)

## 2019-10-26 LAB — TRIGLYCERIDES: Triglycerides: 271 mg/dL — ABNORMAL HIGH (ref <150)

## 2019-10-26 LAB — PHOSPHORUS: Phosphorus: 4 mg/dL (ref 2.5–4.6)

## 2019-10-26 LAB — MAGNESIUM: Magnesium: 2.1 mg/dL (ref 1.7–2.4)

## 2019-10-26 MED ORDER — TRAVASOL 10 % IV SOLN
INTRAVENOUS | Status: AC
  Filled 2019-10-26: qty 720

## 2019-10-26 MED ORDER — INSULIN ASPART 100 UNIT/ML ~~LOC~~ SOLN
0.0000 [IU] | SUBCUTANEOUS | Status: DC
  Administered 2019-10-26 – 2019-10-27 (×2): 5 [IU] via SUBCUTANEOUS
  Administered 2019-10-27 (×2): 3 [IU] via SUBCUTANEOUS

## 2019-10-26 MED ORDER — PERFLUTREN LIPID MICROSPHERE
1.0000 mL | INTRAVENOUS | Status: AC | PRN
  Administered 2019-10-26: 4 mL via INTRAVENOUS
  Filled 2019-10-26: qty 10

## 2019-10-26 NOTE — Consult Note (Signed)
Daily Rounding Note  10/26/2019, 2:31 PM  LOS: 7 days   SUBJECTIVE:   Chief complaint:     Not a lot of abdominal pain.  Tolerating some clears Ostomy output: minor, nothing recorded.  795 ML to RLQ JP drain. Still on levophed,  BP still soft, not tachy.  OBJECTIVE:         Vital signs in last 24 hours:    Temp:  [97.6 F (36.4 C)-98.6 F (37 C)] 98.4 F (36.9 C) (07/26 1200) Pulse Rate:  [57-97] 86 (07/26 1400) Resp:  [9-18] 17 (07/26 1400) BP: (80-126)/(46-94) 112/80 (07/26 1400) SpO2:  [92 %-98 %] 93 % (07/26 1400) Last BM Date: 10/23/19 Filed Weights   10/18/19 1620 10/24/19 0452  Weight: 53.5 kg 77 kg   General: pale, comfortable, alert.  Pale and looks moderately ill   Heart: RRR Chest: loose productive cough but lungs clear in front.  No dyspnea Abdomen: watery blood in ostomy.  BS active, slight distention but soft.  Moderate tenderness Extremities: pitting pedal edema Neuro/Psych:  Oriented x 3.  Alert, calm.  No tremors, no gross deficits or weakness.  Fluid speech.    Intake/Output from previous day: 07/25 0701 - 07/26 0700 In: 3143.3 [I.V.:2993.5; IV Piggyback:149.8] Out: 1580 [Urine:785; Drains:795]  Intake/Output this shift: Total I/O In: 822.4 [I.V.:733.6; IV Piggyback:88.8] Out: 455 [Urine:325; Drains:130]  Lab Results: Recent Labs    10/24/19 0142 10/25/19 1131 10/26/19 0508  WBC  --  14.3* 11.4*  HGB 10.2* 8.1* 7.3*  HCT 30.0* 24.4* 22.6*  PLT  --  246 223   BMET Recent Labs    10/24/19 0142 10/25/19 1131 10/26/19 0508  NA 132* 135 133*  K 4.0 3.6 4.3  CL 98 106 104  CO2  --  22 25  GLUCOSE 112* 112* 414*  BUN 9 7 5*  CREATININE 0.30* 0.57 0.52  CALCIUM  --  7.1* 6.8*   LFT Recent Labs    10/26/19 0508  PROT 3.1*  ALBUMIN 1.1*  AST 13*  ALT 12  ALKPHOS 72  BILITOT 0.3   PT/INR No results for input(s): LABPROT, INR in the last 72 hours. Hepatitis  Panel Recent Labs    10/25/19 1131  HEPBSAG NON REACTIVE    Studies/Results: ECHOCARDIOGRAM COMPLETE  Result Date: 10/26/2019    ECHOCARDIOGRAM REPORT   Patient Name:   Natasha Barrera Date of Exam: 10/26/2019 Medical Rec #:  [id number]       Height:       61.0 in Accession #:    [id number]      Weight:       169.8 lb Date of Birth:  1965/06/15       BSA:          1.762 m Patient Age:    54 years        BP:           100/84 mmHg Patient Gender: F               HR:           83 bpm. Exam Location:  Inpatient Procedure: 2D Echo and Intracardiac Opacification Agent Indications:     dyspnea 786.09  History:         Patient has no prior history of Echocardiogram examinations.                  Lower extremity edema; Risk Factors:Hypertension.  Sonographer:     Johny Chess Referring Phys:  [place] Diagnosing Phys: Eleonore Chiquito MD IMPRESSIONS  1. Left ventricular ejection fraction, by estimation, is 50 to 55%. The left ventricle has low normal function. The left ventricle has no regional wall motion abnormalities. Left ventricular diastolic function could not be evaluated.  2. Right ventricular systolic function is normal. The right ventricular size is normal.  3. Moderate pleural effusion in the left lateral region.  4. The mitral valve is grossly normal. Trivial mitral valve regurgitation. No evidence of mitral stenosis.  5. The aortic valve was not well visualized. Aortic valve regurgitation is not visualized. No aortic stenosis is present. FINDINGS  Left Ventricle: Left ventricular ejection fraction, by estimation, is 50 to 55%. The left ventricle has low normal function. The left ventricle has no regional wall motion abnormalities. Definity contrast agent was given IV to delineate the left ventricular endocardial borders. The left ventricular internal cavity size was normal in size. There is no left ventricular hypertrophy. Left ventricular diastolic function could not be evaluated due to  nondiagnostic images. Left ventricular diastolic function could not be evaluated. Right Ventricle: The right ventricular size is normal. No increase in right ventricular wall thickness. Right ventricular systolic function is normal. Left Atrium: Left atrial size was normal in size. Right Atrium: Right atrial size was normal in size. Pericardium: Trivial pericardial effusion is present. Presence of pericardial fat pad. Mitral Valve: The mitral valve is grossly normal. Trivial mitral valve regurgitation. No evidence of mitral valve stenosis. Tricuspid Valve: The tricuspid valve is grossly normal. Tricuspid valve regurgitation is trivial. No evidence of tricuspid stenosis. Aortic Valve: The aortic valve was not well visualized. Aortic valve regurgitation is not visualized. No aortic stenosis is present. Pulmonic Valve: The pulmonic valve was grossly normal. Pulmonic valve regurgitation is not visualized. No evidence of pulmonic stenosis. Aorta: The aortic root is normal in size and structure. Venous: The inferior vena cava was not well visualized. IAS/Shunts: The atrial septum is grossly normal. Additional Comments: There is a moderate pleural effusion in the left lateral region.  LEFT VENTRICLE PLAX 2D LVIDd:         4.30 cm  Diastology LVIDs:         3.40 cm  LV e' lateral:   9.68 cm/s LV PW:         0.60 cm  LV E/e' lateral: 10.0 LV IVS:        0.60 cm  LV e' medial:    10.40 cm/s LVOT diam:     1.80 cm  LV E/e' medial:  9.3 LVOT Area:     2.54 cm  RIGHT VENTRICLE RV S prime:     14.00 cm/s TAPSE (M-mode): 2.3 cm LEFT ATRIUM             Index LA diam:        3.50 cm 1.99 cm/m LA Vol (A2C):   28.8 ml 16.35 ml/m LA Vol (A4C):   25.0 ml 14.19 ml/m LA Biplane Vol: 27.7 ml 15.72 ml/m   AORTA Ao Root diam: 2.90 cm MITRAL VALVE               TRICUSPID VALVE MV Area (PHT): 3.46 cm    TR Peak grad:   35.0 mmHg MV Decel Time: 219 msec    TR Vmax:        296.00 cm/s MV E velocity: 96.70 cm/s MV A velocity: 91.20 cm/s   SHUNTS MV  E/A ratio:  1.06        Systemic Diam: 1.80 cm Eleonore Chiquito MD Electronically signed by Eleonore Chiquito MD Signature Date/Time: 10/26/2019/1:44:11 PM    Final (Updated)    [place] EKG SITE RITE  Result Date: 10/24/2019 If Site Rite image not attached, placement could not be confirmed due to current cardiac rhythm.  Scheduled Meds: . Chlorhexidine Gluconate Cloth  6 each Topical Daily  . feeding supplement  1 Container Oral TID BM  . insulin aspart  0-15 Units Subcutaneous Q4H  . insulin aspart  0-9 Units Subcutaneous Q4H  . methylPREDNISolone (SOLU-MEDROL) injection  40 mg Intravenous Q12H  . sodium chloride flush  10-40 mL Intracatheter Q12H  . sodium chloride flush  3 mL Intravenous Once  . sodium chloride flush  3 mL Intravenous Q12H   Continuous Infusions: . sodium chloride Stopped (10/26/19 1339)  . heparin 1,300 Units/hr (10/26/19 1400)  . methocarbamol (ROBAXIN) IV    . norepinephrine (LEVOPHED) Adult infusion 15 mcg/min (10/26/19 1400)  . piperacillin-tazobactam (ZOSYN)  IV 12.5 mL/hr at 10/26/19 1400  . TPN ADULT (ION) 40 mL/hr at 10/26/19 1400  . TPN ADULT (ION)     PRN Meds:.sodium chloride, acetaminophen **OR** acetaminophen, HYDROmorphone (DILAUDID) injection, methocarbamol (ROBAXIN) IV, ondansetron, perflutren lipid microspheres (DEFINITY) IV suspension, sodium chloride flush, sodium chloride flush  ASSESMENT:   *  Pan-colitis, likely Crohn's dz.   Previous sigmoid colectomy 2013 for diverticulitis.   Admitted and started Solumedrol 10/21/19 EGD: distal esophageal ring, O/w normal. bxs of duodenum obtained, pndg. Path: No histopathologic changes, no increased intraepithelial lymphocytes or changes of villous architecture. 10/21/19 Colonoscopy: scarring, bridging thruout colon w rectal sparing, grossly favoring Crohn's dz. TI normal. Biopsies obtained.  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 No granulomas or dysplasia. Then suffered bowel perf and s/p 10/23/19 ex lap, LOA,  Segmental distal colectomy, colostomy. perf at previous sigmoid anastomosis.    Solumedrol continues.   On IV Zosyn.   Quant gold and TPMT testing in progress. GI MD is Dr Tarri Glenn.    *   Multifactorial acute on chronic anemia.  S/p transfusion.   Elevated MCV, normal B12 and Folate.   *   Malnutrition, moderate.  Prolonged NPO.   PICC, TPN now in place.   *   Focal mural thrombus of infrarenal aorta.  On Heparin.     *   Hyponatremia.    PLAN   *   Post surgical care and mgt of po intake per surgery and CCM.  GI following from a distance.  *   At some point, as outpt will start biologics and/or immune modulating meds.    *   Given bowel perf and emergency surgery, is Solumedrol still ok??   *   Follow Hgb, transfuse to keep Hgb at or > 7.     Azucena Freed  10/26/2019, 2:31 PM Phone [phone]

## 2019-10-26 NOTE — Consult Note (Signed)
[place] Nurse ostomy consult note Stoma type/location: LUQ, end colostomy Stomal assessment/size: pink, moist  Peristomal assessment: NA Treatment options for stomal/peristomal skin:  Output bloody Ostomy pouching: 2pc. 2 2 3/4"   Education provided:  Began conversation with patient about ostomy creation and left in the patient's room; contacted patient's wife with her permission to arrange visit for teaching tomorrow.  Enrolled patient in [place] program: Yes  [place] Nurse will follow along with you for continued support with ostomy teaching and care [place] MSN, [place], [place], [place], [place]

## 2019-10-26 NOTE — Progress Notes (Signed)
Nutrition Follow-up  DOCUMENTATION CODES:   Non-severe (moderate) malnutrition in context of chronic illness  INTERVENTION:   Boost Breeze po TID, each supplement provides 250 kcal and 9 grams of protein.   Advance TPN per Pharmacy to goal rate; monitor diet advancement and PO intake   NUTRITION DIAGNOSIS:   Moderate Malnutrition related to chronic illness (crohn's disease) as evidenced by moderate muscle depletion, moderate fat depletion; ongoing  GOAL:   Patient will meet greater than or equal to 90% of their needs; progressing  MONITOR:   PO intake, Supplement acceptance, Skin, Weight trends, Labs, I & O's  REASON FOR ASSESSMENT:   Malnutrition Screening Tool, Consult Assessment of nutrition requirement/status  ASSESSMENT:   [name] is a 54 year old female with past medical history of HTN, diverticulitis s/p sigmoid colectomy in 2013, ventral hernia (repaired), and prior episode of colitis, and a recent hospitalization for diarrhea, hypotension and electrolyte derangements secondary to colitis who returned nine days after leaving AMA found to have continued electrolyte derangements with new onset peripheral edema.   Pt discussed during ICU rounds and with RN.  Spoke with pt. She does not like boost breeze but she is trying to drink them. Pt is a little hesitant to eat post op per staff.   Pt with Inflammatory Colitis secondary to Crohn's Disease  7/24 s/p ex lap, LOA, segmental colectomy with end colostomy due to large perforation in the prior rectosigmoid anastomosis 7/25 TPN started due to anticipated prolonged ileus post op  Labs and medications reviewed.   TPN @ 60 ml/hr provides 1478 kcal and 72 grams protein  Diet Order:   Diet Order            Diet clear liquid Room service appropriate? Yes; Fluid consistency: Thin  Diet effective now                 EDUCATION NEEDS:   No education needs have been identified at this time  Skin:  Skin  Assessment: Reviewed RN Assessment  Last BM:  7/23; no colostomy output yet  Height:   Ht Readings from Last 1 Encounters:  10/24/19 5' 1"  (1.549 m)    Weight:   Wt Readings from Last 1 Encounters:  10/24/19 77 kg    Ideal Body Weight:  54.5 kg  BMI:  Body mass index is 32.07 kg/m.  Estimated Nutritional Needs:   Kcal:  1700-1900  Protein:  90-105 grams  Fluid:  > 1.7 L  Dana Debo P., RD, LDN, CNSC See AMiON for contact information

## 2019-10-26 NOTE — Progress Notes (Signed)
ANTICOAGULATION CONSULT NOTE - Follow Up Consult  Pharmacy Consult for heparin Indication: mural thrombus  Labs: Recent Labs    10/23/19 1800 10/24/19 0142 10/24/19 0142 10/25/19 1131 10/26/19 0508  HGB  --  10.2*   < > 8.1* 7.3*  HCT  --  30.0*  --  24.4* 22.6*  PLT  --   --   --  246 223  HEPARINUNFRC  --   --   --   --  0.18*  CREATININE  --  0.30*  --  0.57  --   TROPONINIHS 4  --   --   --   --    < > = values in this interval not displayed.    Assessment: 54yo female subtherapeutic on heparin with initial dosing for mural thrombus; no gtt issues or signs of bleeding per RN.  Goal of Therapy:  Heparin level 0.3-0.7 units/ml   Plan:  Will increase heparin gtt by 3 units/kg/hr to 1300 units/hr and check level in 6 hours.    Wynona Neat, PharmD, BCPS  10/26/2019,5:53 AM

## 2019-10-26 NOTE — Progress Notes (Signed)
[facility]  Subjective  -no complaints.   Objective 100/84 57 98.5 F (36.9 C) (Oral) (!) 9 96%  Intake/Output Summary (Last 24 hours) at 10/26/2019 0945 Last data filed at 10/26/2019 0900 Gross per 24 hour  Intake 2179.09 ml  Output 1705 ml  Net 474.09 ml    Palpable DP pulses bilateral lower extremities.  Laboratory Lab Results: Recent Labs    10/25/19 1131 10/26/19 0508  WBC 14.3* 11.4*  HGB 8.1* 7.3*  HCT 24.4* 22.6*  PLT 246 223   BMET Recent Labs    10/25/19 1131 10/26/19 0508  NA 135 133*  K 3.6 4.3  CL 106 104  CO2 22 25  GLUCOSE 112* 414*  BUN 7 5*  CREATININE 0.57 0.52  CALCIUM 7.1* 6.8*    COAG Lab Results  Component Value Date   INR 1.13 10/25/2011   No results found for: PTT  Assessment/Planning:  Continue heparin for infrarenal aortic thrombus noted on CT.  Lower extremities remain well perfused.  Will likely arrange repeat CT prior to discharge in the next 40 to 72 hours.  Hgb 8.1 --> 7.3.  Marty Heck 10/26/2019 9:45 AM --

## 2019-10-26 NOTE — Progress Notes (Signed)
°  Progress Note    10/26/2019 7:54 AM 2 Days Post-Op  Subjective:  No major complaints. Just states that she is tired and sort of still taking in all that has happened over the weekend   Vitals:   10/26/19 0600 10/26/19 0700  BP: (!) 104/57 100/84  Pulse: 58 57  Resp: (!) 9 (!) 9  Temp:    SpO2: 98% 96%   Physical Exam: Cardiac:  regular Lungs: non labored Extremities:  2+ radial pulses bilaterally, 2+ femoral pulses bilaterally, 2+ DP pulses bilaterally. Upper and lower extremities very edematous Abdomen:  Soft, colostomy pink, wound dressings clean and dry Neurologic: alert and oriented  CBC    Component Value Date/Time   WBC 11.4 (H) 10/26/2019 0508   RBC 2.26 (L) 10/26/2019 0508   HGB 7.3 (L) 10/26/2019 0508   HCT 22.6 (L) 10/26/2019 0508   PLT 223 10/26/2019 0508   MCV 100.0 10/26/2019 0508   MCH 32.3 10/26/2019 0508   MCHC 32.3 10/26/2019 0508   RDW 20.0 (H) 10/26/2019 0508   LYMPHSABS 1.0 10/26/2019 0508   MONOABS 0.3 10/26/2019 0508   EOSABS 0.0 10/26/2019 0508   BASOSABS 0.0 10/26/2019 0508    BMET    Component Value Date/Time   NA 133 (L) 10/26/2019 0508   K 4.3 10/26/2019 0508   CL 104 10/26/2019 0508   CO2 25 10/26/2019 0508   GLUCOSE 414 (H) 10/26/2019 0508   BUN 5 (L) 10/26/2019 0508   CREATININE 0.52 10/26/2019 0508   CALCIUM 6.8 (L) 10/26/2019 0508   GFRNONAA >60 10/26/2019 0508   GFRAA >60 10/26/2019 0508    INR    Component Value Date/Time   INR 1.13 10/25/2011 1638     Intake/Output Summary (Last 24 hours) at 10/26/2019 0754 Last data filed at 10/26/2019 6950 Gross per 24 hour  Intake 3143.32 ml  Output 1580 ml  Net 1563.32 ml     Assessment/Plan:  54 y.o. 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 This is likely secondary to her hypercoagulable state with newly diagnosed crohn's disease s/p. Partial colectomy and colostomy for colon perforation. She remains without signs of atheroembolism or ischemia. Her lower  extremities are well perfused with easily palpable femoral and DP pulses bilaterally. Motor and sensation intact. She is on Heparin gtt per pharmacy. Hgb down to 7.6 today. Plan is to re image her with CT scan in several days. Vascular will continue to follow  DVT prophylaxis:  Heparin gtt   Karoline Caldwell, PA-C [facility] [phone] 10/26/2019 7:54 AM

## 2019-10-26 NOTE — Plan of Care (Signed)
?  Problem: Clinical Measurements: ?Goal: Will remain free from infection ?Outcome: Progressing ?  ?

## 2019-10-26 NOTE — Progress Notes (Signed)
ANTICOAGULATION CONSULT NOTE - Follow Up Consult  Pharmacy Consult for heparin Indication: mural thrombus  No Known Allergies  Patient Measurements: Height: 5' 1"  (154.9 cm) Weight: 77 kg (169 lb 12.1 oz) IBW/kg (Calculated) : 47.8 Heparin Dosing Weight: 64.9kg  Vital Signs: Temp: 98.4 F (36.9 C) (07/26 1200) Temp Source: Oral (07/26 1200) BP: 113/65 (07/26 1200) Pulse Rate: 77 (07/26 1200)  Labs: Recent Labs    10/23/19 1800 10/24/19 0142 10/24/19 0142 10/25/19 1131 10/26/19 0508 10/26/19 1223  HGB  --  10.2*   < > 8.1* 7.3*  --   HCT  --  30.0*  --  24.4* 22.6*  --   PLT  --   --   --  246 223  --   HEPARINUNFRC  --   --   --   --  0.18* 0.39  CREATININE  --  0.30*  --  0.57 0.52  --   TROPONINIHS 4  --   --   --   --   --    < > = values in this interval not displayed.    Estimated Creatinine Clearance: 75.5 mL/min (by C-G formula based on SCr of 0.52 mg/dL).   Medical History: Past Medical History:  Diagnosis Date  . Diverticul disease small and large intestine, no perforati or abscess   . Diverticulitis of colon   . Hypertension     Medications:  Infusions:  . sodium chloride 10 mL/hr at 10/26/19 1200  . heparin 1,300 Units/hr (10/26/19 1200)  . methocarbamol (ROBAXIN) IV    . norepinephrine (LEVOPHED) Adult infusion 15 mcg/min (10/26/19 1200)  . piperacillin-tazobactam (ZOSYN)  IV 3.375 g (10/26/19 1281)  . TPN ADULT (ION) 40 mL/hr at 10/26/19 1200  . TPN ADULT (ION)      Assessment: 46 yof found to have a mural thrombus on IV heparin for anticoagulation.   Heparin level this AM is therapeutic at 0.39 on 1300 units/hr of IV heparin. Hgb is low at 7.3 and platelets are WNL.  Goal of Therapy:  Heparin level 0.3-0.7 units/ml Monitor platelets by anticoagulation protocol: Yes   Plan:  Continue heparin gtt at 1300 units/hr - NO BOLUS d/t recent surgery Check a confirmatory 8 hr heparin level Daily heparin level and CBC  Albertina Parr,  PharmD., BCPS, BCCCP Clinical Pharmacist Clinical phone for 10/26/19 until 3:30pm: [phone] If after 3:30pm, please refer to [facility] for unit-specific pharmacist

## 2019-10-26 NOTE — Progress Notes (Signed)
  Echocardiogram 2D Echocardiogram has been performed.  Johny Chess 10/26/2019, 9:03 AM

## 2019-10-26 NOTE — Progress Notes (Signed)
ANTICOAGULATION CONSULT NOTE - Follow Up Consult  Pharmacy Consult for heparin Indication: mural thrombus  No Known Allergies  Patient Measurements: Height: 5' 1"  (154.9 cm) Weight: 77 kg (169 lb 12.1 oz) IBW/kg (Calculated) : 47.8 Heparin Dosing Weight: 64.9kg  Vital Signs: Temp: 97.8 F (36.6 C) (07/26 2000) Temp Source: Oral (07/26 2000) BP: 113/67 (07/26 1900) Pulse Rate: 84 (07/26 1900)  Labs: Recent Labs    10/24/19 0142 10/24/19 0142 10/25/19 1131 10/26/19 0508 10/26/19 1223 10/26/19 2030  HGB 10.2*   < > 8.1* 7.3*  --   --   HCT 30.0*  --  24.4* 22.6*  --   --   PLT  --   --  246 223  --   --   HEPARINUNFRC  --   --   --  0.18* 0.39 0.57  CREATININE 0.30*  --  0.57 0.52  --   --    < > = values in this interval not displayed.    Estimated Creatinine Clearance: 75.5 mL/min (by C-G formula based on SCr of 0.52 mg/dL).  Assessment: 63 yof found to have a mural thrombus on IV heparin for anticoagulation.   Heparin level remains within goal  Goal of Therapy:  Heparin level 0.3-0.7 units/ml Monitor platelets by anticoagulation protocol: Yes   Plan:  Continue heparin gtt at 1300 units/hr - NO BOLUS d/t recent surgery Daily heparin level and CBC  Barth Kirks, PharmD, BCPS, BCCCP Clinical Pharmacist [phone]  Please check AMION for all [place] numbers  10/26/2019 9:27 PM

## 2019-10-26 NOTE — Progress Notes (Signed)
Inpatient Rehab Admissions Coordinator Note:   Per PT recommendation, pt was screened for CIR candidacy by Gayland Curry, MS, CCC-SLP.  At this time we are recommending and Inpatient Rehab consult. AC will contact attending for consult order.  Please contact me with questions.    Gayland Curry, [place], [place] Admissions Coordinator [phone] 10/26/19 4:57 PM

## 2019-10-26 NOTE — Progress Notes (Signed)
2 Days Post-Op   Subjective/Chief Complaint: Pain overall controlled, some pain with movement. Started sipping on clears last night. Denies nausea or emesis. Denies gas/ stool in ostomy. Has not met with WOC RN yet. Foley removed this AM for TOV.  Objective: Vital signs in last 24 hours: Temp:  [97.6 F (36.4 C)-98.6 F (37 C)] 98.5 F (36.9 C) (07/26 0800) Pulse Rate:  [57-97] 57 (07/26 0700) Resp:  [9-19] 9 (07/26 0700) BP: (73-126)/(46-84) 100/84 (07/26 0700) SpO2:  [92 %-100 %] 96 % (07/26 0700) Last BM Date: 10/23/19  Intake/Output from previous day: 07/25 0701 - 07/26 0700 In: 3143.3 [I.V.:2993.5; IV Piggyback:149.8] Out: 1580 [Urine:785; Drains:795] Intake/Output this shift: No intake/output data recorded.  General appearance: alert and cooperative Resp: clear to auscultation bilaterally Cardio: regular rate and rhythm GI: soft, wound clean, ostomy pink with SS drainage in bag - no gas stool. Drain output turbid serous fluid (795 cc/24h) Extremities: anasarca  Lab Results:  Recent Labs    10/25/19 1131 10/26/19 0508  WBC 14.3* 11.4*  HGB 8.1* 7.3*  HCT 24.4* 22.6*  PLT 246 223   BMET Recent Labs    10/25/19 1131 10/26/19 0508  NA 135 133*  K 3.6 4.3  CL 106 104  CO2 22 25  GLUCOSE 112* 414*  BUN 7 5*  CREATININE 0.57 0.52  CALCIUM 7.1* 6.8*   PT/INR No results for input(s): LABPROT, INR in the last 72 hours. ABG No results for input(s): PHART, HCO3 in the last 72 hours.  Invalid input(s): PCO2, PO2  Studies/Results: [place] EKG SITE RITE  Result Date: 10/24/2019 If Site Rite image not attached, placement could not be confirmed due to current cardiac rhythm.   Anti-infectives: Anti-infectives (From admission, onward)   Start     Dose/Rate Route Frequency Ordered Stop   10/24/19 0600  piperacillin-tazobactam (ZOSYN) IVPB 3.375 g     Discontinue     3.375 g 12.5 mL/hr over 240 Minutes Intravenous Every 8 hours 10/23/19 2305     10/24/19 0000   piperacillin-tazobactam (ZOSYN) IVPB 4.5 g  Status:  Discontinued        4.5 g 200 mL/hr over 30 Minutes Intravenous Every 6 hours 10/23/19 2257 10/23/19 2302   10/23/19 2315  piperacillin-tazobactam (ZOSYN) IVPB 3.375 g        3.375 g 100 mL/hr over 30 Minutes Intravenous STAT 10/23/19 2302 10/23/19 2347     Assessment/Plan: 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 Henrene Pastor, per GI Sigmoid colon perforation - S/P partial colectomy and end colostomy by Dr. Kae Heller 7/24. Ileus anticipated. Sips of clears, continue TPN, GI following - holding steroids  Sepsis - per primary team, persistent hypotension requiring pressor support (levo at 12 mcg)  FEN: TNA, continue CLD + boost breeze and await ROBF  ID: [email or web address]  VTE: SCD's, hep gtt Foley: removed today by RN, TOV Dispo: ICU, hypotension requiring pressors  WOC RN consulted for new ostomy     LOS: 7 days    Jill Alexanders 10/26/2019

## 2019-10-26 NOTE — Progress Notes (Addendum)
NAME:  Natasha Barrera, MRN:  [id number], DOB:  1966/03/26, LOS: 7 ADMISSION DATE:  10/18/2019, CONSULTATION DATE: 10/24/2019 REFERRING MD:  Dr. Philipp Ovens, CHIEF COMPLAINT: Hypertension  Brief History   54 year old female admitted with lower extremity edema and abdominal pain, work-up during admission revealed Crohn's disease confirmed with upper and lower endoscopy and pathological results.  Underwent exploratory laparotomy with lysis of adhesions due to pneumoperitoneum early morning of 7/24.  PCCM consulted for persistent hypotension afternoon of 7/24  History of present illness   Natasha Barrera is a 54 year old female with a past medical history significant for hypertension and diverticulitis (status post sigmoid colectomy 2015 ) who originally presented with complaints of lower extremity edema with later reports of abdominal pain and decreased appetite.  Patient underwent upper and lower endoscopy 7/29 with Dr. Henrene Pastor that showed findings concerning for Crohn's colitis, this was confirmed with pathological report.    Afternoon of 7/23 patient developed acute onset upper abdominal pain resulting in urgent CT of the abdomen that revealed large amount of pneumoperitoneum for which patient underwent exploratory laparotomy with lysis of adhesions and segmental colectomy with end colostomy by general surgery  Post surgery patient was seen with progressive hypotension despite IV hydration and IV albumin in the setting of severe hypoalbuminemia.  PCCM consulted for further management and possible need of vasopressors.  Past Medical History  Hypertension Diverticulitis  Significant Hospital Events   Admitted 7/18  Consults:  General surgery Gastroenterology PCCM Vascular surgery  Procedures:  7/24 exploratory laparotomy with lysis of adhesions and segmental colectomy with end colostomy by general surgery  7/24 PICC line >  Significant Diagnostic Tests:  CT angio chest 7/18 > negative for  acute PE, emphysema  CT abdomen and pelvis 7/23 >  1.extensive pneumoperitoneum 2.  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  Small bilateral pleural effusions 4.  Diffuse hepatic steatosis  Micro Data:  Covid 7/18 > negative  C. Difficile 7/20 > negative Surgical PCR 7/23 > negative  Antimicrobials:  Zosyn 7/24 >  Interim history/subjective:  No significant overnight events  Objective   Blood pressure 100/84, pulse 57, temperature 97.6 F (36.4 C), temperature source Oral, resp. rate (!) 9, height 5' 1"  (1.549 m), weight 77 kg, last menstrual period 12/15/2014, SpO2 96 %.        Intake/Output Summary (Last 24 hours) at 10/26/2019 0756 Last data filed at 10/26/2019 9735 Gross per 24 hour  Intake 3143.32 ml  Output 1580 ml  Net 1563.32 ml   Filed Weights   10/18/19 1620 10/24/19 0452  Weight: 53.5 kg 77 kg    Examination: General: chronically ill appearing female in NAD HEENT: normal Cardiac: RRR. Currently on levo @12mcg  Pulm: productive cough present. Lungs clear anteriorly GI: hypoactive bs. Colostomy present with little dark brown liquid output. Right side incision present with intact dressing. tendereness to light palpation Neuro: a/o Skin: scattered ecchymosis   Resolved Hospital Problem list     Assessment & Plan:  Persistent hypotension requiring pressor support. Currently on 5mg levo -Patient has been seen with persistent hypotension since admission but has progressively worsened over the clinical course.   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 PHenrene Pastorwhich revealed signs of Crohn's this was confirmed with biopsy.  P: Primary management per gastroenterology and  gen surgery, appreciate assistance TPN started diet progression per general surgery Continue solumedrol Follow quantiferon and viral hepatitis panel in anticipation of biologic requirement following medical stablization  Interval development of focal mural thrombus within the infrarenal aorta -Seen on CT ABD 7/23 Plan: primary management per Vascular surgery Anticoagulation with heparin Will likely need interval CT scan at some point during this hospitalization  Post operative Anemia. hgb drop overnight 8.1>7.3 Plan: continue monitoring hgb. Transfuse for hemoglobin <7.   Hyperosmolar hyponatremic hypokalemia -Likely secondary to hypovolemia, sodium remained stable in the low 130s P: Trend bmet  Best practice:  Diet: N.p.o. Pain/Anxiety/Delirium protocol (if indicated): As needed VAP protocol (if indicated): Not applicable DVT prophylaxis: SCDs GI prophylaxis: PPI Glucose control: Monitor Mobility: Bedrest Code Status: Full code Family Communication: Patient's husband updated again at bedside 7/25 Disposition: ICU  Labs   CBC: Recent Labs  Lab 10/21/19 1257 10/21/19 1257 10/21/19 2204 10/22/19 1949 10/24/19 0142 10/25/19 1131 10/26/19 0508  WBC 4.2  --  4.4 8.0  --  14.3* 11.4*  NEUTROABS  --   --  3.7  --   --   --  10.3*  HGB 6.3*   < > 10.4* 10.7* 10.2* 8.1* 7.3*  HCT 19.5*   < > 30.7* 32.0* 30.0* 24.4* 22.6*  MCV 103.7*  --  94.5 95.0  --  96.4 100.0  PLT 362  --  353 302  --  246 223   < > = values in this interval not displayed.    Basic Metabolic Panel: Recent Labs  Lab 10/20/19 1850 10/21/19 1240 10/21/19 2204 10/22/19 1949 10/24/19 0142 10/25/19 1131 10/26/19 0508  NA 130*   < > 132* 130* 132* 135 133*  K 3.3*   < > 3.8 4.6 4.0 3.6 4.3  CL 100   < > 101 103 98 106 104  CO2 23  --  22 20*  --  22 25  GLUCOSE 105*   < > 114* 115* 112* 112* 414*  BUN 5*   < > 6 7 9 7  5*  CREATININE 0.45   < > 0.43* 0.46 0.30* 0.57 0.52  CALCIUM 6.8*  --  7.3*  7.5*  --  7.1* 6.8*  MG  --   --   --   --   --  1.6* 2.1  PHOS  --   --  4.0  --   --  3.9 4.0   < > = values in this interval not displayed.   GFR: Estimated Creatinine Clearance: 75.5 mL/min (by C-G formula based on SCr of 0.52 mg/dL).  Mitzi Hansen, MD Internal Medicine Program PGY 2 10/26/19 8:22 AM

## 2019-10-26 NOTE — Progress Notes (Signed)
PHARMACY - TOTAL PARENTERAL NUTRITION CONSULT NOTE   Indication: Prolonged ileus  Patient Measurements: Height: 5' 1"  (154.9 cm) Weight: 77 kg (169 lb 12.1 oz) IBW/kg (Calculated) : 47.8 TPN AdjBW (KG): 55.1 Body mass index is 32.07 kg/m.  Assessment:  71 yof with remote history of sigmoid colectomy and recent admission d/t colitis initially presented with lower extremity edema and later developed abdominal pain with decreased appetite. S/p EGD + colonoscopy on 7/21 that showed Crohn's colitis. On 7/23 a CT showed large amount pneumoperitoneum and patients underwent exlap with partial colectomy and colostomy. To start TPN for anticipated prolonged ileus.   Glucose / Insulin: Glucose on CMET is 414 but peripheral glucose sticks are 108-185. LIkely the CMET may have been drawn from PICC line where TPN was infusing  Electrolytes: Na 133, other electrolytes wnl  Renal: SCr 0.3, BUN 9 LFTs / TGs: LFTs WNL Prealbumin / albumin: prealbumin <5, albumin 1.5 Intake / Output; MIVF: 339m drain output, 5551mNG output (now removed), UOP 0.3m58mg/hr GI Imaging: 7/23 Abd xray - ileus vs SBO 7/23 CT abd - Extensive pneumoperitoneum, most consistent with colonic perforation given the persistent diffuse colitis Surgeries / Procedures:  7/21 Colonoscopy/EGD 7/24 Exlap, partial colectomy and colostomy  Central access: PICC placed 7/25 TPN start date: 7/25  Nutritional Goals (per RD recommendation on 7/22): kCal: 1700-1900, Protein: 90-105g, Fluid: >1.7L  Goal TPN rate is 75 mL/hr (provides 90 g of protein and 1849 kcals per day)  Current Nutrition:  Clear liquids and Oral supplements  Plan:  Increase TPN to 75m28m at 1800 - provides 72g protein and 1478kcal per day (86% of goal calories)  Electrolytes in TPN: Increase Na to 70mE79mof Na, Decrease K to 40mEq63mcontinue 3mEq/L24m Ca, 3mEq/L 37mMg, and 13mmol/L36mPhos. Cl:Ac ratio 1:1 Add standard MVI and trace elements to TPN Increase to  Moderate q4h SSI and adjust as needed  Monitor TPN labs on Mon/Thurs F/u further RD recommendations  Elleana Stillson Albertina Parr, BCPS, BCCCP Clinical Pharmacist Clinical phone for 10/16/19 until 3:30pm: [phone] 3:30pm, please refer to [facility]-specific [facility]ist

## 2019-10-26 NOTE — Evaluation (Signed)
Physical Therapy Evaluation Patient Details Name: INGEBORG FITE MRN: [id number] DOB: [birth year] Today's Date: 10/26/2019   History of Present Illness  Shanese Casseus is a 54 year old female with a past medical history significant for hypertension and diverticulitis (status post sigmoid colectomy 2015) who originally presented with complaints of lower extremity edema with later reports of abdominal pain and decreased appetite.  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  Clinical Impression  Patient presents with decreased mobility due to pain from surgery, generalized weakness, decreased balance, decreased activity tolerance with HR up to 130-140's and SpO2 into 70's on 6L O2, but back up to 90 with pursed lip breathing within 3 minutes.  Today needing max A for OOB to [facility] and back to bed.  Previously, she lived with her spouse and was independent previously.  Feel she will benefit from skilled PT in the acute setting and from follow up CIR level rehab at d/c.    Follow Up Recommendations CIR    Equipment Recommendations  Rolling walker with 5" wheels;3in1 (PT)    Recommendations for Other Services Rehab consult     Precautions / Restrictions Precautions Precautions: Fall Precaution Comments: JP drain R flank, colostomy      Mobility  Bed Mobility Overal bed mobility: Needs Assistance Bed Mobility: Supine to Sit;Sit to Sidelying     Supine to sit: Mod assist;HOB elevated   Sit to sidelying: Mod assist General bed mobility comments: assist to progress LE's to EOB and to lift trunk, to sidelying due to abdominal pain with A for LE"s; had to quickly elevate HOB due to pt reporting unable to breathe with SpO2 in low 80's to 70's improved to 90's with 6L O2 and  mod cues for pursed lip breathing and HOB at 50 degrees RN aware and in to check on pt with HR 130's  Transfers Overall transfer level: Needs assistance   Transfers: Sit to/from [employer] Transfers Sit to Stand: Max assist   Squat pivot transfers: Max assist     General transfer comment: attempted to stand to RW from EOB but pt's legs did not support so assisted to [facility] and back to bed via squat pivot blocking knees  Ambulation/Gait                Stairs            Wheelchair Mobility    Modified Rankin (Stroke Patients Only)       Balance Overall balance assessment: Needs assistance Sitting-balance support: Feet unsupported Sitting balance-Leahy Scale: Fair Sitting balance - Comments: initially feel dangling, but sitting with S assist     Standing balance-Leahy Scale: Zero Standing balance comment: unable to stand this session                             Pertinent Vitals/Pain Pain Assessment: Faces Faces Pain Scale: Hurts even more Pain Location: abdomen with mobility Pain Descriptors / Indicators: Sore;Tender Pain Intervention(s): Monitored during session;Repositioned;Limited activity within patient's tolerance;Patient requesting pain meds-RN notified    Home Living                        Prior Function  Hand Dominance        Extremity/Trunk Assessment   Upper Extremity Assessment Upper Extremity Assessment: RUE deficits/detail;LUE deficits/detail RUE Deficits / Details: edema throughout UE's and weeping from both arms, strength at least 3+/5 LUE Deficits / Details: edema throughout UE's and weeping from both arms, strength at least 3+/5    Lower Extremity Assessment Lower Extremity Assessment: LLE deficits/detail;RLE deficits/detail RLE Deficits / Details: AAROM grossly WFL, strength hip flexion 2/5, knee extension 3+/5, ankle DF 4-/5; edema throughout LE's LLE Deficits / Details: AAROM  grossly WFL, strength hip flexion 2/5, knee extension 3+/5, ankle DF 4-/5; edema throughout LE's       Communication      Cognition Arousal/Alertness: Awake/alert Behavior During Therapy: WFL for tasks assessed/performed Overall Cognitive Status: Within Functional Limits for tasks assessed                                 General Comments: seems WFL, but not formally assessed      General Comments General comments (skin integrity, edema, etc.): UE's weeping with pads under arms at rest on pillows. jp drain on R side    Exercises     Assessment/Plan    PT Assessment Patient needs continued PT services  PT Problem List Decreased strength;Decreased mobility;Decreased activity tolerance;Decreased balance;Decreased knowledge of use of DME;Pain;Cardiopulmonary status limiting activity;Decreased safety awareness       PT Treatment Interventions DME instruction;Therapeutic activities;Therapeutic exercise;Balance training;Functional mobility training;Gait training;Patient/family education;Wheelchair mobility training    PT Goals (Current goals can be found in the Care Plan section)  Acute Rehab PT Goals Patient Stated Goal: to breathe better PT Goal Formulation: With patient Time For Goal Achievement: 11/09/19 Potential to Achieve Goals: Good    Frequency Min 3X/week   Barriers to discharge        Co-evaluation               AM-PAC PT "6 Clicks" Mobility  Outcome Measure Help needed turning from your back to your side while in a flat bed without using bedrails?: A Lot Help needed moving from lying on your back to sitting on the side of a flat bed without using bedrails?: Total Help needed moving to and from a bed to a chair (including a wheelchair)?: Total Help needed standing up from a chair using your arms (e.g., wheelchair or bedside chair)?: Total Help needed to walk in hospital room?: Total Help needed climbing 3-5 steps with a railing? : Total 6  Click Score: 7    End of Session Equipment Utilized During Treatment: Oxygen Activity Tolerance: Treatment limited secondary to medical complications (Comment) (high HR, difficulty breathing and SpO2 into 70's on 6L O2) Patient left: in bed;with call bell/phone within reach;with nursing/sitter in room Nurse Communication: Mobility status PT Visit Diagnosis: Other abnormalities of gait and mobility (R26.89);Muscle weakness (generalized) (M62.81)    Time: 6789-3810 PT Time Calculation (min) (ACUTE ONLY): 36 min   Charges:   PT Evaluation $PT Eval Moderate Complexity: 1 Mod PT Treatments $Therapeutic Activity: 8-22 mins        Magda Kiel, PT Acute Rehabilitation Services FBPZW:[phone] Office:[phone] 10/26/2019   Reginia Naas 10/26/2019, 4:38 PM

## 2019-10-27 ENCOUNTER — Inpatient Hospital Stay (HOSPITAL_COMMUNITY): Payer: 59

## 2019-10-27 DIAGNOSIS — R571 Hypovolemic shock: Secondary | ICD-10-CM

## 2019-10-27 DIAGNOSIS — I7419 Embolism and thrombosis of other parts of aorta: Secondary | ICD-10-CM | POA: Diagnosis not present

## 2019-10-27 DIAGNOSIS — J9601 Acute respiratory failure with hypoxia: Secondary | ICD-10-CM

## 2019-10-27 DIAGNOSIS — K50118 Crohn's disease of large intestine with other complication: Secondary | ICD-10-CM | POA: Diagnosis not present

## 2019-10-27 DIAGNOSIS — E46 Unspecified protein-calorie malnutrition: Secondary | ICD-10-CM

## 2019-10-27 LAB — BASIC METABOLIC PANEL
Anion gap: 10 (ref 5–15)
Anion gap: 9 (ref 5–15)
BUN: <5 mg/dL — ABNORMAL LOW (ref 6–20)
BUN: <5 mg/dL — ABNORMAL LOW (ref 6–20)
CO2: 27 mmol/L (ref 22–32)
CO2: 30 mmol/L (ref 22–32)
Calcium: 7.1 mg/dL — ABNORMAL LOW (ref 8.9–10.3)
Calcium: 7.2 mg/dL — ABNORMAL LOW (ref 8.9–10.3)
Chloride: 101 mmol/L (ref 98–111)
Chloride: 100 mmol/L (ref 98–111)
Creatinine, Ser: 0.48 mg/dL (ref 0.44–1.00)
Creatinine, Ser: 0.4 mg/dL — ABNORMAL LOW (ref 0.44–1.00)
GFR calc Af Amer: >60 mL/min (ref >60)
GFR calc Af Amer: >60 mL/min (ref >60)
GFR calc non Af Amer: >60 mL/min (ref >60)
GFR calc non Af Amer: >60 mL/min (ref >60)
Glucose, Bld: 161 mg/dL — ABNORMAL HIGH (ref 70–99)
Glucose, Bld: 114 mg/dL — ABNORMAL HIGH (ref 70–99)
Potassium: 2.8 mmol/L — ABNORMAL LOW (ref 3.5–5.1)
Potassium: 3.3 mmol/L — ABNORMAL LOW (ref 3.5–5.1)
Sodium: 138 mmol/L (ref 135–145)
Sodium: 139 mmol/L (ref 135–145)

## 2019-10-27 LAB — POCT I-STAT 7, (LYTES, BLD GAS, ICA,H+H)
Acid-base deficit: 5 mmol/L — ABNORMAL HIGH (ref 0.0–2.0)
Bicarbonate: 20.3 mmol/L (ref 20.0–28.0)
Calcium, Ion: 1.11 mmol/L — ABNORMAL LOW (ref 1.15–1.40)
HCT: 25 % — ABNORMAL LOW (ref 36.0–46.0)
Hemoglobin: 8.5 g/dL — ABNORMAL LOW (ref 12.0–15.0)
O2 Saturation: 95 %
Patient temperature: 98.3
Potassium: 3.5 mmol/L (ref 3.5–5.1)
Sodium: 138 mmol/L (ref 135–145)
TCO2: 21 mmol/L — ABNORMAL LOW (ref 22–32)
pCO2 arterial: 36.7 mmHg (ref 32.0–48.0)
pH, Arterial: 7.351 (ref 7.350–7.450)
pO2, Arterial: 81 mmHg — ABNORMAL LOW (ref 83.0–108.0)

## 2019-10-27 LAB — CBC
HCT: 25.6 % — ABNORMAL LOW (ref 36.0–46.0)
Hemoglobin: 8 g/dL — ABNORMAL LOW (ref 12.0–15.0)
MCH: 32 pg (ref 26.0–34.0)
MCHC: 31.3 g/dL (ref 30.0–36.0)
MCV: 102.4 fL — ABNORMAL HIGH (ref 80.0–100.0)
Platelets: 243 10*3/uL (ref 150–400)
RBC: 2.5 MIL/uL — ABNORMAL LOW (ref 3.87–5.11)
RDW: 19.5 % — ABNORMAL HIGH (ref 11.5–15.5)
WBC: 19.4 10*3/uL — ABNORMAL HIGH (ref 4.0–10.5)
nRBC: 0 % (ref 0.0–0.2)

## 2019-10-27 LAB — GLUCOSE, CAPILLARY
Glucose-Capillary: 240 mg/dL — ABNORMAL HIGH (ref 70–99)
Glucose-Capillary: 162 mg/dL — ABNORMAL HIGH (ref 70–99)
Glucose-Capillary: 145 mg/dL — ABNORMAL HIGH (ref 70–99)
Glucose-Capillary: 149 mg/dL — ABNORMAL HIGH (ref 70–99)
Glucose-Capillary: 130 mg/dL — ABNORMAL HIGH (ref 70–99)
Glucose-Capillary: 138 mg/dL — ABNORMAL HIGH (ref 70–99)

## 2019-10-27 LAB — SURGICAL PATHOLOGY

## 2019-10-27 LAB — HEPARIN LEVEL (UNFRACTIONATED)
Heparin Unfractionated: 0.89 IU/mL — ABNORMAL HIGH (ref 0.30–0.70)
Heparin Unfractionated: 0.7 IU/mL (ref 0.30–0.70)
Heparin Unfractionated: 0.58 IU/mL (ref 0.30–0.70)

## 2019-10-27 MED ORDER — TRAVASOL 10 % IV SOLN
INTRAVENOUS | Status: AC
  Filled 2019-10-27: qty 720

## 2019-10-27 MED ORDER — INSULIN ASPART 100 UNIT/ML ~~LOC~~ SOLN
0.0000 [IU] | SUBCUTANEOUS | Status: DC
  Administered 2019-10-27 – 2019-10-28 (×6): 3 [IU] via SUBCUTANEOUS
  Administered 2019-10-28: 4 [IU] via SUBCUTANEOUS
  Administered 2019-10-28 (×2): 3 [IU] via SUBCUTANEOUS
  Administered 2019-10-29: 4 [IU] via SUBCUTANEOUS
  Administered 2019-10-29 – 2019-10-30 (×7): 3 [IU] via SUBCUTANEOUS
  Administered 2019-10-31: 4 [IU] via SUBCUTANEOUS
  Administered 2019-10-31: 3 [IU] via SUBCUTANEOUS
  Administered 2019-10-31: 2 [IU] via SUBCUTANEOUS
  Administered 2019-10-31: 7 [IU] via SUBCUTANEOUS

## 2019-10-27 MED ORDER — POTASSIUM CHLORIDE 10 MEQ/50ML IV SOLN
10.0000 meq | INTRAVENOUS | Status: AC
  Administered 2019-10-27 (×2): 10 meq via INTRAVENOUS
  Filled 2019-10-27 (×4): qty 50

## 2019-10-27 MED ORDER — FUROSEMIDE 10 MG/ML IJ SOLN
40.0000 mg | Freq: Once | INTRAMUSCULAR | Status: AC
  Administered 2019-10-27: 40 mg via INTRAVENOUS
  Filled 2019-10-27: qty 4

## 2019-10-27 MED ORDER — POTASSIUM CHLORIDE 10 MEQ/50ML IV SOLN
10.0000 meq | INTRAVENOUS | Status: AC
  Administered 2019-10-27 (×5): 10 meq via INTRAVENOUS
  Filled 2019-10-27 (×5): qty 50

## 2019-10-27 MED ORDER — POTASSIUM CHLORIDE 10 MEQ/50ML IV SOLN
10.0000 meq | INTRAVENOUS | Status: AC
  Administered 2019-10-27 – 2019-10-28 (×4): 10 meq via INTRAVENOUS
  Filled 2019-10-27 (×4): qty 50

## 2019-10-27 NOTE — Progress Notes (Signed)
eLink Physician-Brief Progress Note Patient Name: KARUNA BALDUCCI DOB: July 18, 1965 MRN: [id number]   Date of Service  10/27/2019  HPI/Events of Note  Hypoxia - Sudden onset of SOB and increased FiO2 requirement. Currently on a Heparin IV infusion. For infrarenal aortic thrombus. Now on NRBM O2.   eICU Interventions  Plan: 1. ABG STAT.  2. Portable CXR STAT.  3. Will ask ground team to evaluate the patient at bedside.      Intervention Category Major Interventions: Hypoxemia - evaluation and management  Yolanda Huffstetler [place] 10/27/2019, 2:15 AM

## 2019-10-27 NOTE — Progress Notes (Signed)
ANTICOAGULATION CONSULT NOTE - Follow Up Consult  Pharmacy Consult for heparin Indication: mural thrombus  No Known Allergies  Patient Measurements: Height: 5' 1"  (154.9 cm) Weight: 77 kg (169 lb 12.1 oz) IBW/kg (Calculated) : 47.8 Heparin Dosing Weight: 64.9kg  Vital Signs: Temp: 97.5 F (36.4 C) (07/27 0800) Temp Source: Oral (07/27 0800) BP: 104/69 (07/27 0800) Pulse Rate: 113 (07/27 0800)  Labs: Recent Labs    10/25/19 1131 10/25/19 1131 10/26/19 0508 10/26/19 0508 10/26/19 1223 10/26/19 2030 10/27/19 0240 10/27/19 0242 10/27/19 0807 10/27/19 0921  HGB 8.1*   < > 7.3*   < >  --   --  8.5* 8.0*  --   --   HCT 24.4*   < > 22.6*  --   --   --  25.0* 25.6*  --   --   PLT 246  --  223  --   --   --   --  243  --   --   HEPARINUNFRC  --   --  0.18*  --    < > 0.57  --  0.89*  --  0.70  CREATININE 0.57  --  0.52  --   --   --   --   --  0.48  --    < > = values in this interval not displayed.    Estimated Creatinine Clearance: 75.5 mL/min (by C-G formula based on SCr of 0.48 mg/dL).  Assessment: 89 yof found to have a mural thrombus on IV heparin for anticoagulation.   Currently on IV heparin at 1200 units/hr. HL this AM on upper end of goal range. H/H low stable. Plt wnl   Goal of Therapy:  Heparin level 0.3-0.7 units/ml Monitor platelets by anticoagulation protocol: Yes   Plan:  Decrease heparin gtt to 1150 units/hr - NO BOLUS d/t recent surgery F/u 6 hr HL  Daily heparin level and CBC  Albertina Parr, PharmD., BCPS, BCCCP Clinical Pharmacist Clinical phone for 10/27/19 until 3:30pm: [phone] If after 3:30pm, please refer to [facility] for unit-specific pharmacist

## 2019-10-27 NOTE — Progress Notes (Signed)
ANTICOAGULATION CONSULT NOTE - Follow Up Consult  Pharmacy Consult for IV Heparin Indication: mural thrombus  No Known Allergies  Patient Measurements: Height: 5' 1"  (154.9 cm) Weight: 77 kg (169 lb 12.1 oz) IBW/kg (Calculated) : 47.8 Heparin Dosing Weight: 64.9 kg  Vital Signs: Temp: 98.6 F (37 C) (07/27 1600) Temp Source: Oral (07/27 1600) BP: 102/81 (07/27 1800) Pulse Rate: 90 (07/27 1951)  Labs: Recent Labs    10/25/19 1131 10/25/19 1131 10/26/19 0508 10/26/19 1223 10/26/19 2030 10/27/19 0240 10/27/19 0242 10/27/19 0807 10/27/19 0921 10/27/19 1820 10/27/19 1822  HGB 8.1*   < > 7.3*  --   --  8.5* 8.0*  --   --   --   --   HCT 24.4*   < > 22.6*  --   --  25.0* 25.6*  --   --   --   --   PLT 246  --  223  --   --   --  243  --   --   --   --   HEPARINUNFRC  --   --  0.18*   < >   < >  --  0.89*  --  0.70  --  0.58  CREATININE 0.57   < > 0.52  --   --   --   --  0.48  --  0.40*  --    < > = values in this interval not displayed.    Estimated Creatinine Clearance: 75.5 mL/min (A) (by C-G formula based on SCr of 0.4 mg/dL (L)).  Assessment: 54 yr old female with mural thrombus within infrarenal aorta, on IV heparin for anticoagulation.   Heparin level 10 hrs after heparin infusion was decreased to 1150 units/hr was 0.58 units/ml, which is within the goal range for this pt. H/H 8.0/25.6, plt 243 (CBC stable). Per RN, no issues with IV or bleeding observed.  Goal of Therapy:  Heparin level 0.3-0.7 units/ml (NO BOLUS due to recent surgery) Monitor platelets by anticoagulation protocol: Yes   Plan:  Continue heparin infusion at 1150 units/hr  Check confirmatory heparin level in 6 hrs Monitor daily heparin level, CBC Monitor for signs/symptoms of bleedingL   Gillermina Hu, PharmD, BCPS, [facility] Clinical Pharmacist 10/27/19, 20:01 PM

## 2019-10-27 NOTE — Progress Notes (Signed)
PCCM Interval Progress Note  Asked to see pt at bedside for worsening hypoxia. Per pt, RN was helping her move after having incontinent episode and she suddenly had worsening dyspnea.  She does feel a good bit improved now but is not quite back to baseline yet.  Denies chest pain, lightheadedness, LE edema.  Doubt PE.   CXR with vascular congestion and ABG with hypoxia. Will administer 23m lasix x 1 and continue to monitor her on NRB.  Wean O2 as able.   RMontey Hora PCordry Sweetwater LakesPulmonary & Critical Care Medicine 10/27/2019, 2:45 AM

## 2019-10-27 NOTE — Consult Note (Signed)
WOC in for scheduled ostomy teaching with husband.  At the time of my arrival patient and wife are having disagreements on timing and patient is very confused. Restless and saying "I don't like him very much"  I tried to reason with patient as well as her husband however she has had some desaturations in the last 24 hours requiring higher levels of of 02 and she is very upset at the time of my arrival.  Agreed with husband to return with educational materials for him and then return for attempt to teach husband ostomy pouch change.    [facility] Nurse will follow along with you for continued support with ostomy teaching and care [place] MSN, RN, [place], [place], [place]

## 2019-10-27 NOTE — Progress Notes (Signed)
ANTICOAGULATION CONSULT NOTE - Follow Up Consult  Pharmacy Consult for heparin Indication: mural thrombus  Labs: Recent Labs    10/25/19 1131 10/25/19 1131 10/26/19 0508 10/26/19 0508 10/26/19 1223 10/26/19 2030 10/27/19 0240 10/27/19 0242  HGB 8.1*   < > 7.3*   < >  --   --  8.5* 8.0*  HCT 24.4*   < > 22.6*  --   --   --  25.0* 25.6*  PLT 246  --  223  --   --   --   --  243  HEPARINUNFRC  --   --  0.18*   < > 0.39 0.57  --  0.89*  CREATININE 0.57  --  0.52  --   --   --   --   --    < > = values in this interval not displayed.    Assessment: 54yo female now supratherapeutic on heparin after two levels at goal though had been trending up; no gtt issues or signs of bleeding per RN.  Goal of Therapy:  Heparin level 0.3-0.7 units/ml   Plan:  Will decrease heparin gtt by 1-2 units/kg/hr to 1200 units/hr and check level in 6 hours.    Wynona Neat, PharmD, BCPS  10/27/2019,3:17 AM

## 2019-10-27 NOTE — Progress Notes (Signed)
PHARMACY - TOTAL PARENTERAL NUTRITION CONSULT NOTE   Indication: Prolonged ileus  Patient Measurements: Height: 5' 1"  (154.9 cm) Weight: 77 kg (169 lb 12.1 oz) IBW/kg (Calculated) : 47.8 TPN AdjBW (KG): 55.1 Body mass index is 32.07 kg/m.  Assessment:  39 yof with remote history of sigmoid colectomy and recent admission d/t colitis initially presented with lower extremity edema and later developed abdominal pain with decreased appetite. S/p EGD + colonoscopy on 7/21 that showed Crohn's colitis. On 7/23 a CT showed large amount pneumoperitoneum and patients underwent exlap with partial colectomy and colostomy. To start TPN for anticipated prolonged ileus.   Glucose / Insulin: CBGs 162-240, Received 23 units of mSSI in 24 hours  Electrolytes: Na up to 138, K down to 2.8, likely refeeding some  Renal: SCr 0.3, BUN 9 LFTs / TGs: LFTs WNL Prealbumin / albumin: prealbumin <5, albumin 1.5 Intake / Output; MIVF: 3797m drain output, 556mNG output (now removed), UOP 0.97m64mg/hr GI Imaging: 7/23 Abd xray - ileus vs SBO 7/23 CT abd - Extensive pneumoperitoneum, most consistent with colonic perforation given the persistent diffuse colitis Surgeries / Procedures:  7/21 Colonoscopy/EGD 7/24 Exlap, partial colectomy and colostomy  Central access: PICC placed 7/25 TPN start date: 7/25  Nutritional Goals (per RD recommendation on 7/22): kCal: 1700-1900, Protein: 90-105g, Fluid: >1.7L  Goal TPN rate is 75 mL/hr (provides 90 g of protein and 1849 kcals per day)  Current Nutrition:  Clear liquids and Oral supplements  TPN  Plan:  Continue TPN at 52m22m at 1800 - provides 72g protein and 1478kcal per day (86% of goal calories)  Electrolytes in TPN:  Na 70mE57mof Na, K 40mEq26m97mEq/L19m Ca, 97mEq/L 52mMg, and 197mmol/L52mPhos. Cl:Ac ratio 1:1 Add standard MVI and trace elements to TPN Increase to Resistant q4h SSI and adjust as needed  Monitor TPN labs on Mon/Thurs F/u Bmet, Phos, Mg  tomorrow    KCl 10 mEq x 5 runs. Recheck K later this afternoon since we are diuresing this AM   Kandas Oliveto Albertina Parr, BCPS, BCCCP Clinical Pharmacist Clinical phone for 10/27/19 until 3:30pm: [phone] 3:30pm, please refer to [facility] for[facility]-specific pharmacist

## 2019-10-27 NOTE — Consult Note (Signed)
[place] nurse returned to patient's room, she has recently received IV pain meds. She is very quite and the patient's husband does not wish for me to demonstrate ostomy pouch change because he feels the patient will not "remember" anything. Discussed ICU delirium and he goes into long details about their life, marriage and such.  I have provided educational materials to the patient and to the husband.  Reported to the husband that regardless the pouch needed to be changed today because the bedside nurse reported some leakage. She is not really having output besides bloody drainage at this time.  He again wishes for me to not "mess with her" since she has calmed down.  Discussed with bedside nurse who will plan to change pouch after husband leaves today. I mentioned to husband I would check in with him to possibly see the patient Thursday or Friday for teaching session which he is agreeable to.   [place] Nurse will follow along with you for continued support with ostomy teaching and care Hiko MSN, RN, [place], [place], [place]

## 2019-10-27 NOTE — Progress Notes (Signed)
[facility] ROUNDING NOTE   Subjective: SOB o/n and increased O2 demand over last 24 hours. CXR with b/l pl effusion. Was given Lasix IV.     Objective: Vital signs in last 24 hours: Temp:  [97.5 F (36.4 C)-98.6 F (37 C)] 98.6 F (37 C) (07/27 2000) Pulse Rate:  [68-141] 90 (07/27 1951) Resp:  [8-32] 19 (07/27 1951) BP: (86-112)/(42-81) 102/81 (07/27 1800) SpO2:  [88 %-100 %] 97 % (07/27 1951) FiO2 (%):  [50 %-60 %] 60 % (07/27 1951) Last BM Date: 10/27/19 General: NAD Abdomen: Wound dressing in place. Abd soft Ext: b/l LE edema    Intake/Output from previous day: 07/26 0701 - 07/27 0700 In: 2545.6 [I.V.:2255.3; IV Piggyback:290.3] Out: 2490 [Urine:1675; Drains:805; Stool:10] Intake/Output this shift: No intake/output data recorded.   Lab Results: Recent Labs    10/25/19 1131 10/25/19 1131 10/26/19 0508 10/27/19 0240 10/27/19 0242  WBC 14.3*  --  11.4*  --  19.4*  HGB 8.1*   < > 7.3* 8.5* 8.0*  PLT 246  --  223  --  243  MCV 96.4  --  100.0  --  102.4*   < > = values in this interval not displayed.   BMET Recent Labs    10/26/19 0508 10/26/19 0508 10/27/19 0240 10/27/19 0807 10/27/19 1820  NA 133*   < > 138 138 139  K 4.3   < > 3.5 2.8* 3.3*  CL 104  --   --  101 100  CO2 25  --   --  27 30  GLUCOSE 414*  --   --  161* 114*  BUN 5*  --   --  <5* <5*  CREATININE 0.52  --   --  0.48 0.40*  CALCIUM 6.8*  --   --  7.1* 7.2*   < > = values in this interval not displayed.   LFT Recent Labs    10/26/19 0508  PROT 3.1*  ALBUMIN 1.1*  AST 13*  ALT 12  ALKPHOS 72  BILITOT 0.3   PT/INR No results for input(s): INR in the last 72 hours.    Imaging/Other results: DG CHEST PORT 1 VIEW  Result Date: 10/27/2019 CLINICAL DATA:  Shortness of breath and hypoxia EXAM: PORTABLE CHEST 1 VIEW COMPARISON:  10/18/2019 FINDINGS: Cardiac shadow is stable. Right-sided PICC line is noted with the catheter tip at the cavoatrial junction. Increasing  bilateral pleural effusions and parenchymal edema are seen when compared with the prior exam consistent progressive congestive failure. IMPRESSION: Changes of progressive congestive failure with effusions and parenchymal edema. Electronically Signed   By: Inez Catalina M.D.   On: 10/27/2019 02:37   ECHOCARDIOGRAM COMPLETE  Result Date: 10/26/2019    ECHOCARDIOGRAM REPORT   Patient Name:   Natasha Barrera Date of Exam: 10/26/2019 Medical Rec #:  [id number]       Height:       61.0 in Accession #:    [id number]      Weight:       169.8 lb Date of Birth:  Sep 21, 1965       BSA:          1.762 m Patient Age:    54 years        BP:           100/84 mmHg Patient Gender: F               HR:  83 bpm. Exam Location:  Inpatient Procedure: 2D Echo and Intracardiac Opacification Agent Indications:     dyspnea 786.09  History:         Patient has no prior history of Echocardiogram examinations.                  Lower extremity edema; Risk Factors:Hypertension.  Sonographer:     Johny Chess Referring Phys:  [place] Diagnosing Phys: Eleonore Chiquito MD IMPRESSIONS  1. Left ventricular ejection fraction, by estimation, is 50 to 55%. The left ventricle has low normal function. The left ventricle has no regional wall motion abnormalities. Left ventricular diastolic function could not be evaluated.  2. Right ventricular systolic function is normal. The right ventricular size is normal.  3. Moderate pleural effusion in the left lateral region.  4. The mitral valve is grossly normal. Trivial mitral valve regurgitation. No evidence of mitral stenosis.  5. The aortic valve was not well visualized. Aortic valve regurgitation is not visualized. No aortic stenosis is present. FINDINGS  Left Ventricle: Left ventricular ejection fraction, by estimation, is 50 to 55%. The left ventricle has low normal function. The left ventricle has no regional wall motion abnormalities. Definity contrast agent was given IV to  delineate the left ventricular endocardial borders. The left ventricular internal cavity size was normal in size. There is no left ventricular hypertrophy. Left ventricular diastolic function could not be evaluated due to nondiagnostic images. Left ventricular diastolic function could not be evaluated. Right Ventricle: The right ventricular size is normal. No increase in right ventricular wall thickness. Right ventricular systolic function is normal. Left Atrium: Left atrial size was normal in size. Right Atrium: Right atrial size was normal in size. Pericardium: Trivial pericardial effusion is present. Presence of pericardial fat pad. Mitral Valve: The mitral valve is grossly normal. Trivial mitral valve regurgitation. No evidence of mitral valve stenosis. Tricuspid Valve: The tricuspid valve is grossly normal. Tricuspid valve regurgitation is trivial. No evidence of tricuspid stenosis. Aortic Valve: The aortic valve was not well visualized. Aortic valve regurgitation is not visualized. No aortic stenosis is present. Pulmonic Valve: The pulmonic valve was grossly normal. Pulmonic valve regurgitation is not visualized. No evidence of pulmonic stenosis. Aorta: The aortic root is normal in size and structure. Venous: The inferior vena cava was not well visualized. IAS/Shunts: The atrial septum is grossly normal. Additional Comments: There is a moderate pleural effusion in the left lateral region.  LEFT VENTRICLE PLAX 2D LVIDd:         4.30 cm  Diastology LVIDs:         3.40 cm  LV e' lateral:   9.68 cm/s LV PW:         0.60 cm  LV E/e' lateral: 10.0 LV IVS:        0.60 cm  LV e' medial:    10.40 cm/s LVOT diam:     1.80 cm  LV E/e' medial:  9.3 LVOT Area:     2.54 cm  RIGHT VENTRICLE RV S prime:     14.00 cm/s TAPSE (M-mode): 2.3 cm LEFT ATRIUM             Index LA diam:        3.50 cm 1.99 cm/m LA Vol (A2C):   28.8 ml 16.35 ml/m LA Vol (A4C):   25.0 ml 14.19 ml/m LA Biplane Vol: 27.7 ml 15.72 ml/m   AORTA Ao  Root diam: 2.90 cm MITRAL VALVE  TRICUSPID VALVE MV Area (PHT): 3.46 cm    TR Peak grad:   35.0 mmHg MV Decel Time: 219 msec    TR Vmax:        296.00 cm/s MV E velocity: 96.70 cm/s MV A velocity: 91.20 cm/s  SHUNTS MV E/A ratio:  1.06        Systemic Diam: 1.80 cm Eleonore Chiquito MD Electronically signed by Eleonore Chiquito MD Signature Date/Time: 10/26/2019/1:44:11 PM    Final (Updated)       Assessment and Plan:  1) Crohns Disease 2) Sigmoid perforation s/p partial colectomy and end colostomy 7/24 3) Infrarenal aorta mural thrombus 4) Acute respiratory failure/Bilateral pleural effusion 5) Hypoalbuminemia  - Discussed with General Surgery staff, and will plan to resume Solumedrol- reduced risk for post op leak given end ostomy (vs anastamosis) - Plan to resume steroids for Crohns monotherapy for now. Could consider addition of prolonged course (~8 weeks) of metronidazole for additional treatment while awaiting start of biologic therapy (typically 30-60 days post op). Additionally, will consider dual immunosuppressive therapy with immunomodulator for both immunosuprression and immunogenicity benefit - Will need improvement in serum albumin for biologics to have appropriate pharmacokinetics - Will f/u on pending labs for Crohns med management - TPN - Encourage PO as tolerated per post op diet plan from surgical service - May benefit from Albumin infusion - GI service will continue to follow periodically   Lavena Bullion, DO  10/27/2019, 9:03 PM [facility] Pager [phone]

## 2019-10-27 NOTE — Progress Notes (Addendum)
NAME:  Natasha Barrera, MRN:  [id number], DOB:  [birth year], LOS: 8 ADMISSION DATE:  10/18/2019, CONSULTATION DATE: 10/24/2019 REFERRING MD:  Dr. Philipp Ovens, CHIEF COMPLAINT: Hypertension  Brief History   54 year old female admitted with lower extremity edema and abdominal pain, work-up during admission revealed Crohn's disease confirmed with upper and lower endoscopy and pathological results.  Underwent exploratory laparotomy with lysis of adhesions due to pneumoperitoneum early morning of 7/24.  PCCM consulted for persistent hypotension afternoon of 7/24  History of present illness   Natasha Barrera is a 54 year old female with a past medical history significant for hypertension and diverticulitis (status post sigmoid colectomy 2015 ) who originally presented with complaints of lower extremity edema with later reports of abdominal pain and decreased appetite.  Patient underwent upper and lower endoscopy 7/29 with Dr. Henrene Pastor that showed findings concerning for Crohn's colitis, this was confirmed with pathological report.    Afternoon of 7/23 patient developed acute onset upper abdominal pain resulting in urgent CT of the abdomen that revealed large amount of pneumoperitoneum for which patient underwent exploratory laparotomy with lysis of adhesions and segmental colectomy with end colostomy by general surgery  Post surgery patient was seen with progressive hypotension despite IV hydration and IV albumin in the setting of severe hypoalbuminemia.  PCCM consulted for further management and possible need of vasopressors.  Past Medical History  Hypertension Diverticulitis  Significant Hospital Events   Admitted 7/18  Consults:  General surgery Gastroenterology PCCM Vascular surgery  Procedures:  7/24 exploratory laparotomy with lysis of adhesions and segmental colectomy with end colostomy by general surgery  7/24 PICC line >  Significant Diagnostic Tests:  CT angio chest 7/18 > negative for  acute PE, emphysema  CT abdomen and pelvis 7/23 >  1.extensive pneumoperitoneum 2.  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  Small bilateral pleural effusions 4.  Diffuse hepatic steatosis  Micro Data:  Covid 7/18 > negative  C. Difficile 7/20 > negative Surgical PCR 7/23 > negative  Antimicrobials:  Zosyn 7/24 >  Interim history/subjective:  Progressive respiratory failure overnight--now on 15L non-rebreather. CXR showing b/l effusions.  Objective   Blood pressure 96/77, pulse 102, temperature 98.3 F (36.8 C), temperature source Oral, resp. rate (!) 8, height 5' 1"  (1.549 m), weight 77 kg, last menstrual period 12/15/2014, SpO2 100 %.        Intake/Output Summary (Last 24 hours) at 10/27/2019 0732 Last data filed at 10/27/2019 0600 Gross per 24 hour  Intake 2545.62 ml  Output 2490 ml  Net 55.62 ml   Filed Weights   10/18/19 1620 10/24/19 0452  Weight: 53.5 kg 77 kg    Examination: General: critically and chronically ill appearing female HEENT: normal Cardiac: tachycardic rate, regular rhythm.  Pulm: on 15L non-rebreather.  productive cough. Lung sounds diminished bilaterally lower>upper. POCUS consistent with moderate-large bilateral pleural effusions. GI: hypoactive bs. Colostomy present with little dark brown liquid output. Right side incision present with intact dressing. tendereness to light palpation Neuro: a/o Skin: scattered ecchymosis   Resolved Hospital Problem list     Assessment & Plan:  Persistent hypotension requiring pressor support. presumably attributable to hypoalbuminemia. Other etiologies considered including cardiogenic. Echocardiogram only significant for a mildly reduced EF 50-55%. P: Titrate levo to maintain MAP >65  Inflammatory pancolitis secondary to Crohn's disease S/p sigmoid bowel perforation s/p partial colectomy and colostomy 7/24 -Patient underwent both upper and lower endoscopies 7/21 by Dr.  Henrene Pastor which revealed signs of Crohn's  this was confirmed with biopsy. -Will eventually need biologic therapy per GI however holding off at this time due to poor pharmacokinetics with low albumin levels and in setting of recent surgery. GI mentioning possible role of prolonged flagyl tid course to promote mucosal healing. 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 stable Plan: continue monitoring hgb. Transfuse for hemoglobin <7.   Leukocytosis. Up 11.4>19.4 from yesterday. Afebrile. Likely reactive from surgery. Continue to monitor.  Acute hypoxic respiratory failure secondary to pulm edema and moderate-large bilateral pleural effusions. Likely attributable to low albumin levels. Was on 15L non-rebreather this morning--had RT switch her over to [facility]. Currently stable on that. Plan: 64m IV lasix right now. If not having significant output with that, will give another dose this afternoon. Thoracentesis considered but high liklihood to re-accumulate fluid quickly due to etiology of effusions.   Hyperosmolar hyponatremic hypokalemia P: follow up BMP  Best practice:  Diet: clears. TPN Pain/Anxiety/Delirium protocol (if indicated): As needed VAP protocol (if indicated): Not applicable DVT prophylaxis: Heparin gtt GI prophylaxis: PPI Glucose control: Monitor Mobility: Bedrest Code Status: Full code Family Communication: Patient's husband updated at bedside  7/27 Disposition: ICU  Labs   CBC: Recent Labs  Lab 10/21/19 2204 10/21/19 2204 10/22/19 1949 10/22/19 1949 10/24/19 0142 10/25/19 1131 10/26/19 0508 10/27/19 0240 10/27/19 0242  WBC 4.4  --  8.0  --   --  14.3* 11.4*  --  19.4*  NEUTROABS 3.7  --   --   --   --   --  10.3*  --   --   HGB 10.4*   < > 10.7*   < > 10.2* 8.1* 7.3* 8.5* 8.0*  HCT 30.7*   < > 32.0*   < > 30.0* 24.4* 22.6* 25.0* 25.6*  MCV 94.5  --  95.0  --   --  96.4 100.0  --  102.4*  PLT 353  --  302  --   --  246 223  --  243   < > = values in this interval not displayed.    Basic Metabolic Panel: Recent Labs  Lab 10/20/19 1850 10/21/19 1240 10/21/19 2204 10/21/19 2204 10/22/19 1949 10/24/19 0142 10/25/19 1131 10/26/19 0508 10/27/19 0240  NA 130*   < > 132*   < > 130* 132* 135 133* 138  K 3.3*   < > 3.8   < > 4.6 4.0 3.6 4.3 3.5  CL 100   < > 101  --  103 98 106 104  --   CO2 23  --  22  --  20*  --  22 25  --   GLUCOSE 105*   < > 114*  --  115* 112* 112* 414*  --   BUN 5*   < > 6  --  7 9 7  5*  --   CREATININE 0.45   < > 0.43*  --  0.46 0.30* 0.57 0.52  --   CALCIUM 6.8*  --  7.3*  --  7.5*  --  7.1*  6.8*  --   MG  --   --   --   --   --   --  1.6* 2.1  --   PHOS  --   --  4.0  --   --   --  3.9 4.0  --    < > = values in this interval not displayed.   GFR: Estimated Creatinine Clearance: 75.5 mL/min (by C-G formula based on SCr of 0.52 mg/dL).  Mitzi Hansen, MD Internal Medicine Program PGY 2 10/27/19 7:32 AM

## 2019-10-27 NOTE — Progress Notes (Signed)
3 Days Post-Op   Subjective/Chief Complaint: Increased O2 requirement overnight, CXR with pulmonary edema, receiving lasix.  Denies abdominal pain, nausea, vomiting. No bowel function yet.  WOC RN coming today per patient.  Afebrile WBC 19.4 from 11.4 Hgb/hct stable 8.0/25  Objective: Vital signs in last 24 hours: Temp:  [97.8 F (36.6 C)-98.4 F (36.9 C)] 98.3 F (36.8 C) (07/27 0400) Pulse Rate:  [68-141] 102 (07/27 0700) Resp:  [8-32] 8 (07/27 0700) BP: (85-116)/(42-97) 96/77 (07/27 0700) SpO2:  [89 %-100 %] 100 % (07/27 0700) Last BM Date: 10/23/19  Intake/Output from previous day: 07/26 0701 - 07/27 0700 In: 2545.6 [I.V.:2255.3; IV Piggyback:290.3] Out: 2490 [Urine:1675; Drains:805; Stool:10] Intake/Output this shift: No intake/output data recorded.  General appearance: alert and cooperative Resp: HFNC to maintain sats >90%, crackles bilateral lung bases  Cardio: regular rate and rhythm GI: soft, wound pale but viable- draining serous fluid (image below), ostomy pink with SS drainage in bag - no gas stool. Drain output turbid serous fluid (805 cc/24h)   Extremities: anasarca, palpable pedal pulses   Lab Results:  Recent Labs    10/26/19 0508 10/26/19 0508 10/27/19 0240 10/27/19 0242  WBC 11.4*  --   --  19.4*  HGB 7.3*   < > 8.5* 8.0*  HCT 22.6*   < > 25.0* 25.6*  PLT 223  --   --  243   < > = values in this interval not displayed.   BMET Recent Labs    10/25/19 1131 10/25/19 1131 10/26/19 0508 10/27/19 0240  NA 135   < > 133* 138  K 3.6   < > 4.3 3.5  CL 106  --  104  --   CO2 22  --  25  --   GLUCOSE 112*  --  414*  --   BUN 7  --  5*  --   CREATININE 0.57  --  0.52  --   CALCIUM 7.1*  --  6.8*  --    < > = values in this interval not displayed.   PT/INR No results for input(s): LABPROT, INR in the last 72 hours. ABG Recent Labs    10/27/19 0240  PHART 7.351  HCO3 20.3    Studies/Results: DG CHEST PORT 1 VIEW  Result Date:  10/27/2019 CLINICAL DATA:  Shortness of breath and hypoxia EXAM: PORTABLE CHEST 1 VIEW COMPARISON:  10/18/2019 FINDINGS: Cardiac shadow is stable. Right-sided PICC line is noted with the catheter tip at the cavoatrial junction. Increasing bilateral pleural effusions and parenchymal edema are seen when compared with the prior exam consistent progressive congestive failure. IMPRESSION: Changes of progressive congestive failure with effusions and parenchymal edema. Electronically Signed   By: Inez Catalina M.D.   On: 10/27/2019 02:37   ECHOCARDIOGRAM COMPLETE  Result Date: 10/26/2019    ECHOCARDIOGRAM REPORT   Patient Name:   Natasha Barrera Date of Exam: 10/26/2019 Medical Rec #:  [id number]       Height:       61.0 in Accession #:    [id number]      Weight:       169.8 lb Date of Birth:  [birth year]       BSA:          1.762 m Patient Age:    54 years        BP:           100/84 mmHg Patient Gender: F  HR:           83 bpm. Exam Location:  Inpatient Procedure: 2D Echo and Intracardiac Opacification Agent Indications:     dyspnea 786.09  History:         Patient has no prior history of Echocardiogram examinations.                  Lower extremity edema; Risk Factors:Hypertension.  Sonographer:     Johny Chess Referring Phys:  [place] Diagnosing Phys: Eleonore Chiquito MD IMPRESSIONS  1. Left ventricular ejection fraction, by estimation, is 50 to 55%. The left ventricle has low normal function. The left ventricle has no regional wall motion abnormalities. Left ventricular diastolic function could not be evaluated.  2. Right ventricular systolic function is normal. The right ventricular size is normal.  3. Moderate pleural effusion in the left lateral region.  4. The mitral valve is grossly normal. Trivial mitral valve regurgitation. No evidence of mitral stenosis.  5. The aortic valve was not well visualized. Aortic valve regurgitation is not visualized. No aortic stenosis is present.  FINDINGS  Left Ventricle: Left ventricular ejection fraction, by estimation, is 50 to 55%. The left ventricle has low normal function. The left ventricle has no regional wall motion abnormalities. Definity contrast agent was given IV to delineate the left ventricular endocardial borders. The left ventricular internal cavity size was normal in size. There is no left ventricular hypertrophy. Left ventricular diastolic function could not be evaluated due to nondiagnostic images. Left ventricular diastolic function could not be evaluated. Right Ventricle: The right ventricular size is normal. No increase in right ventricular wall thickness. Right ventricular systolic function is normal. Left Atrium: Left atrial size was normal in size. Right Atrium: Right atrial size was normal in size. Pericardium: Trivial pericardial effusion is present. Presence of pericardial fat pad. Mitral Valve: The mitral valve is grossly normal. Trivial mitral valve regurgitation. No evidence of mitral valve stenosis. Tricuspid Valve: The tricuspid valve is grossly normal. Tricuspid valve regurgitation is trivial. No evidence of tricuspid stenosis. Aortic Valve: The aortic valve was not well visualized. Aortic valve regurgitation is not visualized. No aortic stenosis is present. Pulmonic Valve: The pulmonic valve was grossly normal. Pulmonic valve regurgitation is not visualized. No evidence of pulmonic stenosis. Aorta: The aortic root is normal in size and structure. Venous: The inferior vena cava was not well visualized. IAS/Shunts: The atrial septum is grossly normal. Additional Comments: There is a moderate pleural effusion in the left lateral region.  LEFT VENTRICLE PLAX 2D LVIDd:         4.30 cm  Diastology LVIDs:         3.40 cm  LV e' lateral:   9.68 cm/s LV PW:         0.60 cm  LV E/e' lateral: 10.0 LV IVS:        0.60 cm  LV e' medial:    10.40 cm/s LVOT diam:     1.80 cm  LV E/e' medial:  9.3 LVOT Area:     2.54 cm  RIGHT VENTRICLE  RV S prime:     14.00 cm/s TAPSE (M-mode): 2.3 cm LEFT ATRIUM             Index LA diam:        3.50 cm 1.99 cm/m LA Vol (A2C):   28.8 ml 16.35 ml/m LA Vol (A4C):   25.0 ml 14.19 ml/m LA Biplane Vol: 27.7 ml 15.72 ml/m  AORTA Ao Root diam: 2.90 cm MITRAL VALVE               TRICUSPID VALVE MV Area (PHT): 3.46 cm    TR Peak grad:   35.0 mmHg MV Decel Time: 219 msec    TR Vmax:        296.00 cm/s MV E velocity: 96.70 cm/s MV A velocity: 91.20 cm/s  SHUNTS MV E/A ratio:  1.06        Systemic Diam: 1.80 cm Eleonore Chiquito MD Electronically signed by Eleonore Chiquito MD Signature Date/Time: 10/26/2019/1:44:11 PM    Final (Updated)     Anti-infectives: Anti-infectives (From admission, onward)   Start     Dose/Rate Route Frequency Ordered Stop   10/24/19 0600  piperacillin-tazobactam (ZOSYN) IVPB 3.375 g     Discontinue     3.375 g 12.5 mL/hr over 240 Minutes Intravenous Every 8 hours 10/23/19 2305     10/24/19 0000  piperacillin-tazobactam (ZOSYN) IVPB 4.5 g  Status:  Discontinued        4.5 g 200 mL/hr over 30 Minutes Intravenous Every 6 hours 10/23/19 2257 10/23/19 2302   10/23/19 2315  piperacillin-tazobactam (ZOSYN) IVPB 3.375 g        3.375 g 100 mL/hr over 30 Minutes Intravenous STAT 10/23/19 2302 10/23/19 2347     Assessment/Plan: 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 Henrene Pastor, per GI Sigmoid colon perforation - S/P partial colectomy and end colostomy by Dr. Kae Heller 7/24. Ileus anticipated. Sips of clears, continue TPN, GI following - holding steroids; POD#3. May need repeat CT scan of the abdomen in the next couple of days if WBC continues to trend up. May be developing post-op abscess. Sepsis - per primary team, off pressors  Acute hypoxic respiratory failure   FEN: TNA, continue CLD + boost breeze and await ROBF; will need to be NPO if requires BiPAP ID: [email or web address]  VTE: SCD's, hep gtt Foley: removed 7/26,  voiding spontaneously  Dispo: ICU     LOS: 8 days     Jill Alexanders 10/27/2019

## 2019-10-27 NOTE — Progress Notes (Signed)
[facility] [place]  Subjective  -increasing oxygen requirements overnight and much more tachycardic.  Has no active complaints including no lower extremity or worsening abdominal pain.   Objective 96/77 102 98.3 F (36.8 C) (Oral) (!) 8 100%  Intake/Output Summary (Last 24 hours) at 10/27/2019 0805 Last data filed at 10/27/2019 0600 Gross per 24 hour  Intake 2440.95 ml  Output 2490 ml  Net -49.05 ml    Palpable DP pulses bilateral lower extremities, significant lower extremity edema 2+ pitting  Laboratory Lab Results: Recent Labs    10/26/19 0508 10/26/19 0508 10/27/19 0240 10/27/19 0242  WBC 11.4*  --   --  19.4*  HGB 7.3*   < > 8.5* 8.0*  HCT 22.6*   < > 25.0* 25.6*  PLT 223  --   --  243   < > = values in this interval not displayed.   BMET Recent Labs    10/25/19 1131 10/25/19 1131 10/26/19 0508 10/27/19 0240  NA 135   < > 133* 138  K 3.6   < > 4.3 3.5  CL 106  --  104  --   CO2 22  --  25  --   GLUCOSE 112*  --  414*  --   BUN 7  --  5*  --   CREATININE 0.57  --  0.52  --   CALCIUM 7.1*  --  6.8*  --    < > = values in this interval not displayed.    COAG Lab Results  Component Value Date   INR 1.13 10/25/2011   No results found for: PTT  Assessment/Planning:  54 year old female admitted with new diagnosis Crohn's disease with perforation requiring laparotomy and colectomy.  Vascular consulted for new infrarenal thrombus noted on CT.  Would continue heparin from my standpoint and hemoglobins have been 8.1 --> 7.3 --> 8.5 --> 8.0.  We will rescan her probably in the next day or 2 but given significant increased oxygen requirement overnight we will delay scan today until she is more optimized with critical care actively involved.  Palpable pedal pulses.  Marty Heck 10/27/2019 8:05 AM --

## 2019-10-28 DIAGNOSIS — K529 Noninfective gastroenteritis and colitis, unspecified: Secondary | ICD-10-CM

## 2019-10-28 DIAGNOSIS — I7419 Embolism and thrombosis of other parts of aorta: Secondary | ICD-10-CM | POA: Diagnosis not present

## 2019-10-28 DIAGNOSIS — D473 Essential (hemorrhagic) thrombocythemia: Secondary | ICD-10-CM

## 2019-10-28 DIAGNOSIS — R0902 Hypoxemia: Secondary | ICD-10-CM

## 2019-10-28 LAB — BASIC METABOLIC PANEL
Anion gap: 7 (ref 5–15)
Anion gap: 8 (ref 5–15)
BUN: 5 mg/dL — ABNORMAL LOW (ref 6–20)
BUN: 6 mg/dL (ref 6–20)
CO2: 29 mmol/L (ref 22–32)
CO2: 30 mmol/L (ref 22–32)
Calcium: 7.1 mg/dL — ABNORMAL LOW (ref 8.9–10.3)
Calcium: 7.1 mg/dL — ABNORMAL LOW (ref 8.9–10.3)
Chloride: 100 mmol/L (ref 98–111)
Chloride: 96 mmol/L — ABNORMAL LOW (ref 98–111)
Creatinine, Ser: 0.35 mg/dL — ABNORMAL LOW (ref 0.44–1.00)
Creatinine, Ser: 0.36 mg/dL — ABNORMAL LOW (ref 0.44–1.00)
GFR calc Af Amer: >60 mL/min (ref >60)
GFR calc Af Amer: >60 mL/min (ref >60)
GFR calc non Af Amer: >60 mL/min (ref >60)
GFR calc non Af Amer: >60 mL/min (ref >60)
Glucose, Bld: 123 mg/dL — ABNORMAL HIGH (ref 70–99)
Glucose, Bld: 151 mg/dL — ABNORMAL HIGH (ref 70–99)
Potassium: 3.5 mmol/L (ref 3.5–5.1)
Potassium: 3.6 mmol/L (ref 3.5–5.1)
Sodium: 136 mmol/L (ref 135–145)
Sodium: 134 mmol/L — ABNORMAL LOW (ref 135–145)

## 2019-10-28 LAB — GLUCOSE, CAPILLARY
Glucose-Capillary: 135 mg/dL — ABNORMAL HIGH (ref 70–99)
Glucose-Capillary: 128 mg/dL — ABNORMAL HIGH (ref 70–99)
Glucose-Capillary: 189 mg/dL — ABNORMAL HIGH (ref 70–99)
Glucose-Capillary: 146 mg/dL — ABNORMAL HIGH (ref 70–99)
Glucose-Capillary: 129 mg/dL — ABNORMAL HIGH (ref 70–99)
Glucose-Capillary: 148 mg/dL — ABNORMAL HIGH (ref 70–99)

## 2019-10-28 LAB — CBC
HCT: 23.7 % — ABNORMAL LOW (ref 36.0–46.0)
Hemoglobin: 7.5 g/dL — ABNORMAL LOW (ref 12.0–15.0)
MCH: 31.6 pg (ref 26.0–34.0)
MCHC: 31.6 g/dL (ref 30.0–36.0)
MCV: 100 fL (ref 80.0–100.0)
Platelets: 208 10*3/uL (ref 150–400)
RBC: 2.37 MIL/uL — ABNORMAL LOW (ref 3.87–5.11)
RDW: 19.1 % — ABNORMAL HIGH (ref 11.5–15.5)
WBC: 13.7 10*3/uL — ABNORMAL HIGH (ref 4.0–10.5)
nRBC: 0.4 % — ABNORMAL HIGH (ref 0.0–0.2)

## 2019-10-28 LAB — HEPARIN LEVEL (UNFRACTIONATED)
Heparin Unfractionated: 0.76 IU/mL — ABNORMAL HIGH (ref 0.30–0.70)
Heparin Unfractionated: 0.82 IU/mL — ABNORMAL HIGH (ref 0.30–0.70)

## 2019-10-28 LAB — PHOSPHORUS: Phosphorus: 1.9 mg/dL — ABNORMAL LOW (ref 2.5–4.6)

## 2019-10-28 LAB — MAGNESIUM: Magnesium: 1.8 mg/dL (ref 1.7–2.4)

## 2019-10-28 MED ORDER — POTASSIUM PHOSPHATES 15 MMOLE/5ML IV SOLN
30.0000 mmol | Freq: Once | INTRAVENOUS | Status: AC
  Administered 2019-10-28: 30 mmol via INTRAVENOUS
  Filled 2019-10-28: qty 10

## 2019-10-28 MED ORDER — TRAVASOL 10 % IV SOLN
INTRAVENOUS | Status: AC
  Filled 2019-10-28: qty 900

## 2019-10-28 MED ORDER — POTASSIUM CHLORIDE 10 MEQ/50ML IV SOLN
10.0000 meq | INTRAVENOUS | Status: AC
  Administered 2019-10-28 (×5): 10 meq via INTRAVENOUS
  Filled 2019-10-28 (×5): qty 50

## 2019-10-28 MED ORDER — METHOCARBAMOL 500 MG PO TABS
1000.0000 mg | ORAL_TABLET | Freq: Three times a day (TID) | ORAL | Status: DC
  Administered 2019-10-28 – 2019-11-03 (×15): 1000 mg via ORAL
  Filled 2019-10-28 (×16): qty 2

## 2019-10-28 MED ORDER — MAGNESIUM SULFATE 2 GM/50ML IV SOLN
2.0000 g | Freq: Once | INTRAVENOUS | Status: AC
  Administered 2019-10-28: 2 g via INTRAVENOUS
  Filled 2019-10-28: qty 50

## 2019-10-28 MED ORDER — FUROSEMIDE 10 MG/ML IJ SOLN
40.0000 mg | Freq: Once | INTRAMUSCULAR | Status: AC
  Administered 2019-10-28: 40 mg via INTRAVENOUS
  Filled 2019-10-28: qty 4

## 2019-10-28 MED ORDER — ACETAMINOPHEN 500 MG PO TABS
1000.0000 mg | ORAL_TABLET | Freq: Once | ORAL | Status: AC
  Administered 2019-10-28: 1000 mg via ORAL
  Filled 2019-10-28: qty 2

## 2019-10-28 MED ORDER — POTASSIUM CHLORIDE 10 MEQ/100ML IV SOLN: 10.0000 meq | INTRAVENOUS | Status: DC

## 2019-10-28 MED ORDER — OXYCODONE HCL 5 MG PO TABS: 5.0000 mg | ORAL_TABLET | ORAL | Status: DC | PRN

## 2019-10-28 MED ORDER — ENSURE ENLIVE PO LIQD
237.0000 mL | Freq: Two times a day (BID) | ORAL | Status: DC
  Administered 2019-10-28 – 2019-10-29 (×2): 237 mL via ORAL

## 2019-10-28 NOTE — Progress Notes (Signed)
PHARMACY - TOTAL PARENTERAL NUTRITION CONSULT NOTE   Indication: Prolonged ileus  Patient Measurements: Height: 5' 1"  (154.9 cm) Weight: 77 kg (169 lb 12.1 oz) IBW/kg (Calculated) : 47.8 TPN AdjBW (KG): 55.1 Body mass index is 32.07 kg/m.  Assessment:  31 yof with remote history of sigmoid colectomy and recent admission d/t colitis initially presented with lower extremity edema and later developed abdominal pain with decreased appetite. S/p EGD + colonoscopy on 7/21 that showed Crohn's colitis. On 7/23 a CT showed large amount pneumoperitoneum and patients underwent exlap with partial colectomy and colostomy. To start TPN for anticipated prolonged ileus.   Glucose / Insulin: CBGs 128-135, Received 18 units of rSSI in 24 hours, on solumedrol  Electrolytes: Phos 1.9, Mg 1.8, K on low end of normal despite aggressive repletion yesterday. Other lytes wnl  Renal: SCr 0.35 LFTs / TGs: LFTs WNL Prealbumin / albumin: prealbumin <5, albumin 1.5 Intake / Output; MIVF: 484m drain output, 400 mL colostomy output, UOP 2.219mkg/hr GI Imaging: 7/23 Abd xray - ileus vs SBO 7/23 CT abd - Extensive pneumoperitoneum, most consistent with colonic perforation given the persistent diffuse colitis Surgeries / Procedures:  7/21 Colonoscopy/EGD 7/24 Exlap, partial colectomy and colostomy  Central access: PICC placed 7/25 TPN start date: 7/25  Nutritional Goals (per RD recommendation on 7/22): kCal: 1700-1900, Protein: 90-105g, Fluid: >1.7L  Goal TPN rate is 75 mL/hr (provides 90 g of protein and 1849 kcals per day)  Current Nutrition:  Clear liquids and Oral supplements  TPN  Plan:  Increase TPN to goal rate of 7577mr at 1800 - provides 90 g of protein and 1849 kcals per day (100% goal calories)  Electrolytes in TPN:  Na 58m30m of Na, K 40mE10m 5mEq/57mf Ca, 5mEq/L47m Mg, and 15mmol/47m Phos. Cl:Ac ratio 1:1 Add standard MVI and trace elements to TPN Continue Resistant q4h SSI and adjust as  needed  Monitor TPN labs on Mon/Thurs F/u Bmet, Phos, Mg tomorrow   Kphos 30 mmol x 1 dose today  Magnesium sulfate x 2 gm IV once    BenjaminAlbertina Parr., BCPS, BCCCP Clinical Pharmacist Clinical phone for 10/28/19 until 3:30pm: [phone] 3:30pm, please refer to [facility]-specific pharmacist

## 2019-10-28 NOTE — Progress Notes (Signed)
ANTICOAGULATION CONSULT NOTE - Follow Up Consult  Pharmacy Consult for heparin Indication: mural thrombus  No Known Allergies  Patient Measurements: Height: 5' 1"  (154.9 cm) Weight: 77 kg (169 lb 12.1 oz) IBW/kg (Calculated) : 47.8 Heparin Dosing Weight: 64.9kg  Vital Signs: Temp: 97.4 F (36.3 C) (07/28 0800) Temp Source: Oral (07/28 0800) BP: 122/72 (07/28 1000) Pulse Rate: 88 (07/28 1000)  Labs: Recent Labs    10/26/19 0508 10/26/19 0508 10/26/19 1223 10/26/19 2030 10/27/19 0240 10/27/19 0242 10/27/19 0242 10/27/19 0807 10/27/19 0921 10/27/19 1820 10/27/19 1822 10/28/19 0511  HGB 7.3*  --   --    < > 8.5* 8.0*  --   --   --   --   --  7.5*  HCT 22.6*  --   --   --  25.0* 25.6*  --   --   --   --   --  23.7*  PLT 223  --   --   --   --  243  --   --   --   --   --  208  HEPARINUNFRC 0.18*  --    < >   < >  --  0.89*   < >  --  0.70  --  0.58 0.76*  CREATININE 0.52   < >  --   --   --   --   --  0.48  --  0.40*  --  0.35*   < > = values in this interval not displayed.    Estimated Creatinine Clearance: 75.5 mL/min (A) (by C-G formula based on SCr of 0.35 mg/dL (L)).  Assessment: 57 yof found to have a mural thrombus on IV heparin for anticoagulation.   Currently on IV heparin at 1150 units/hr. HL has been fluctuating a bit at current rate and is slightly above goal range this AM. This likely because levels are being drawn from PICC line where heparin is infusing. Will leave at current rate for now and recheck level. H/H low stable. Plt wnl. No overt s/s of bleeding noted.    Goal of Therapy:  Heparin level 0.3-0.7 units/ml Monitor platelets by anticoagulation protocol: Yes   Plan:  Continue heparin gtt at 1150 units/hr - NO BOLUS d/t recent surgery F/u recheck HL this evening.  Daily heparin level and CBC  Albertina Parr, PharmD., BCPS, BCCCP Clinical Pharmacist Clinical phone for 10/28/19 until 3:30pm: [phone] If after 3:30pm, please refer to [facility]  for unit-specific pharmacist

## 2019-10-28 NOTE — Progress Notes (Signed)
Pt stated she has had a bad reaction in the past from Oxycodone and does not want to take it.  Let PM nurse know to update AM nurse of this information.

## 2019-10-28 NOTE — Progress Notes (Signed)
[facility] [place]  Subjective  -doing better today   Objective 122/72 88 (!) 97.4 F (36.3 C) (Oral) 16 97%  Intake/Output Summary (Last 24 hours) at 10/28/2019 1032 Last data filed at 10/28/2019 1000 Gross per 24 hour  Intake 2192.68 ml  Output 4165 ml  Net -1972.32 ml    Palpable DP pulses bilateral lower extremities, significant lower extremity edema 2+ pitting  Laboratory Lab Results: Recent Labs    10/27/19 0242 10/28/19 0511  WBC 19.4* 13.7*  HGB 8.0* 7.5*  HCT 25.6* 23.7*  PLT 243 208   BMET Recent Labs    10/27/19 1820 10/28/19 0511  NA 139 136  K 3.3* 3.5  CL 100 100  CO2 30 29  GLUCOSE 114* 123*  BUN <5* 5*  CREATININE 0.40* 0.35*  CALCIUM 7.2* 7.1*    COAG Lab Results  Component Value Date   INR 1.13 10/25/2011   No results found for: PTT  Assessment/Planning:  54 year old female admitted with new diagnosis Crohn's disease with perforation requiring laparotomy and colectomy.  Vascular consulted for new infrarenal thrombus noted on CT.  Would continue heparin from my standpoint and hemoglobin 8 --> 7.5 today.  Plan repeat CTA abdomen pelvis tomorrow.   Marty Heck 10/28/2019 10:32 AM --

## 2019-10-28 NOTE — Progress Notes (Signed)
Pt's respiratory status is improving. Off pressors. Stable for transfer to [facility]. Handoff given to Dr. Avon Gully.  Mitzi Hansen, MD Internal Medicine Resident PGY-2 [facility] Pager: [phone] 10/28/2019 3:08 PM

## 2019-10-28 NOTE — Progress Notes (Signed)
Physical Therapy Treatment Patient Details Name: Natasha Barrera MRN: [id number] DOB: [birth year] Today's Date: 10/28/2019    History of Present Illness Natasha Barrera is a 54 year old female with a past medical history significant for hypertension and diverticulitis (status post sigmoid colectomy 2015) who originally presented with complaints of lower extremity edema with later reports of abdominal pain and decreased appetite.  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    PT Comments    Patient progressing this session with stand step to [facility] with RW and able to take several steps with RW toward HOB.  She remains limited due to high O2 requirement and LE weakness with continued edema.  She did not panic this session as last time and SpO2 remained above 90.  She will continue to benefit from skilled PT during acute stay and will likely need post acute inpatient rehab prior to d/c home.    Follow Up Recommendations  CIR     Equipment Recommendations  Rolling walker with 5" wheels;3in1 (PT)    Recommendations for Other Services       Precautions / Restrictions Precautions Precautions: Fall Precaution Comments: JP drain R flank, colostomy    Mobility  Bed Mobility Overal bed mobility: Needs Assistance Bed Mobility: Rolling;Sidelying to Sit;Sit to Sidelying Rolling: Min assist Sidelying to sit: Min assist     Sit to sidelying: Mod assist General bed mobility comments: cues for tecnnique assist for trunk upright pt pushing up and to guide legs off bed, pt to supine with assist for legs and again cues for technique  Transfers Overall transfer level: Needs assistance Equipment used: Rolling walker (2 wheeled) Transfers: Sit to/from [employer] Sit to Stand: Mod assist;+2 safety/equipment Stand pivot transfers: +2 safety/equipment;Min assist       General transfer comment: lifting assist to stand from EOB and from [facility] to RW, stand step to [facility] with min A for balance/safety and lines  Ambulation/Gait Ambulation/Gait assistance: Min assist Gait Distance (Feet): 3 Feet Assistive device: Rolling walker (2 wheeled) Gait Pattern/deviations: Step-to pattern;Decreased stride length     General Gait Details: assist for steps forward up toward [facility] with RW and for safety to turn to sit with lines, etc   Stairs             Wheelchair Mobility    Modified Rankin (Stroke Patients Only)       Balance Overall balance assessment: Needs assistance Sitting-balance support: Feet supported Sitting balance-Leahy Scale: Fair     Standing balance support: Bilateral upper extremity supported Standing balance-Leahy Scale: Poor Standing balance comment: UE support for balance with LE weakness                            Cognition   Behavior During Therapy: WFL for tasks assessed/performed Overall Cognitive Status: Within Functional Limits for tasks assessed                                        Exercises      General Comments General comments (skin integrity, edema, etc.): pt seated on BSC and RN assisting to wash up and change linens and gown, pt able  to wash under armpits and face, fatigues quickly and dyspneic on 15L HFNC, but SpO2 stayed in the 90's HR max 120's      Pertinent Vitals/Pain Pain Assessment: Faces Faces Pain Scale: Hurts even more Pain Location: abdomen with mobility Pain Descriptors / Indicators: Sore;Tender Pain Intervention(s): Monitored during session;Repositioned;Limited activity within patient's tolerance    Home Living                      Prior Function            PT Goals (current goals can now be found in the care plan section) Progress  towards PT goals: Progressing toward goals    Frequency    Min 3X/week      PT Plan Current plan remains appropriate    Co-evaluation              AM-PAC PT "6 Clicks" Mobility   Outcome Measure  Help needed turning from your back to your side while in a flat bed without using bedrails?: A Lot Help needed moving from lying on your back to sitting on the side of a flat bed without using bedrails?: A Lot Help needed moving to and from a bed to a chair (including a wheelchair)?: A Lot Help needed standing up from a chair using your arms (e.g., wheelchair or bedside chair)?: A Lot Help needed to walk in hospital room?: A Lot Help needed climbing 3-5 steps with a railing? : Total 6 Click Score: 11    End of Session Equipment Utilized During Treatment: Oxygen Activity Tolerance: Patient limited by fatigue Patient left: in bed;with call bell/phone within reach   PT Visit Diagnosis: Other abnormalities of gait and mobility (R26.89);Muscle weakness (generalized) (M62.81)     Time: 1340-1403 PT Time Calculation (min) (ACUTE ONLY): 23 min  Charges:  $Therapeutic Activity: 23-37 mins                     Magda Kiel, PT Acute Rehabilitation Services WCHEN:[phone] Office:[phone] 10/28/2019    Reginia Naas 10/28/2019, 5:30 PM

## 2019-10-28 NOTE — Progress Notes (Signed)
NAME:  Natasha Barrera, MRN:  [id number], DOB:  03/09/1966, LOS: 9 ADMISSION DATE:  10/18/2019, CONSULTATION DATE: 10/24/2019 REFERRING MD:  Dr. Philipp Ovens, CHIEF COMPLAINT: Hypertension  Brief History   54 year old female admitted with lower extremity edema and abdominal pain, work-up during admission revealed Crohn's disease confirmed with upper and lower endoscopy and pathological results.  Underwent exploratory laparotomy with lysis of adhesions due to pneumoperitoneum early morning of 7/24.  PCCM consulted for persistent hypotension afternoon of 7/24  History of present illness   Natasha Barrera is a 54 year old female with a past medical history significant for hypertension and diverticulitis (status post sigmoid colectomy 2015 ) who originally presented with complaints of lower extremity edema with later reports of abdominal pain and decreased appetite.  Patient underwent upper and lower endoscopy 7/29 with Dr. Henrene Pastor that showed findings concerning for Crohn's colitis, this was confirmed with pathological report.    Afternoon of 7/23 patient developed acute onset upper abdominal pain resulting in urgent CT of the abdomen that revealed large amount of pneumoperitoneum for which patient underwent exploratory laparotomy with lysis of adhesions and segmental colectomy with end colostomy by general surgery  Post surgery patient was seen with progressive hypotension despite IV hydration and IV albumin in the setting of severe hypoalbuminemia.  PCCM consulted for further management and possible need of vasopressors.  Past Medical History  Hypertension Diverticulitis  Significant Hospital Events   Admitted 7/18  Consults:  General surgery Gastroenterology PCCM Vascular surgery  Procedures:  7/24 exploratory laparotomy with lysis of adhesions and segmental colectomy with end colostomy by general surgery  7/24 PICC line >  Significant Diagnostic Tests:  CT angio chest 7/18 > negative for  acute PE, emphysema  CT abdomen and pelvis 7/23 >  1.extensive pneumoperitoneum 2.  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  Small bilateral pleural effusions 4.  Diffuse hepatic steatosis  Micro Data:  Covid 7/18 > negative  C. Difficile 7/20 > negative Surgical PCR 7/23 > negative  Antimicrobials:  Zosyn 7/24 >  Interim history/subjective:  Seen at bedside with gen surgery and vascular surgery. Gen surg discussed that they will upgrade her to a regular diet now that she is having stool and gas in ostomy bag. They adjusted her pain regimen as well as this seems to be the limiting factor for mobility right now. Pt's husband has been persistent with Deneice regarding her eating more--GS spoke about how she only needs to do what she can handle at this time. Vascular planning for CTA tomorrow morning. Her husband has been somewhat inappropriate with the patient and floor staff. I witnessed an argument between husband and pt yesterday morning. Prior to entering the room this morning, general surgery was discussing adjustments to pain regimen with pt's bedside RN. The husband came out of the room, requesting pain medications and requested that the conversation be stopped so pt could get her pain meds. Upon entering the room, pt did not appear to be in significant distress.  Objective   Blood pressure 98/77, pulse 78, temperature 98.1 F (36.7 C), temperature source Axillary, resp. rate 15, height 5' 1"  (1.549 m), weight 77 kg, last menstrual period 12/15/2014, SpO2 95 %.    FiO2 (%):  [50 %-60 %] 60 %   Intake/Output Summary (Last 24 hours) at 10/28/2019 0736 Last data filed at 10/28/2019 0600 Gross per 24 hour  Intake 2004.26 ml  Output 4865 ml  Net -2860.74 ml   [employer]  10/18/19 1620 10/24/19 0452  Weight: 53.5 kg 77 kg    Examination: General: critically and chronically ill appearing female HEENT: normal Cardiac: tachycardic rate,  regular rhythm. +3 pitting edema in the lower extremities. Extremities warm Pulm: on 25L HHF @60 % FiO2. Bilateral crackles and rhonchi with upper respiratory sounds present as well GI: right  Neuro: a/o Skin: scattered ecchymosis   Resolved Hospital Problem list   Hypotension requiring pressor support  Assessment & Plan:    Inflammatory pancolitis secondary to Crohn's disease (new dx) S/p sigmoid bowel perforation s/p partial colectomy and colostomy 7/24 -Will eventually need biologic therapy per GI however holding off at this time due to poor pharmacokinetics with low albumin levels and in setting of recent surgery. GI mentioning possible role of prolonged flagyl tid course to promote mucosal healing. -will additionally need HBV vaccine series -quantiferon test indeterminate P: Primary management per gastroenterology and gen surgery, appreciate assistance -Continue tpn -diet progression to regular per general surgery -Steroids per surgery  Infrarenal aortic thrombus. Incidentally noted on 7/23 CT.  P: continue heparin. Repeat CTA tomorrow AM  Post operative Anemia. Fairly stable. No obvious blood loss. Plan: continue monitoring hgb. Transfuse for hemoglobin <7.   Leukocytosis. Trending down. Remains afebrile. Likely reactive for surgery.  Acute hypoxic respiratory failure secondary to pulm edema and moderate-large bilateral pleural effusions. On 25L HHF @60 %FiO2. Had good UOP with lasix yesterday. Plan: 34m IV lasix again today. If not having significant output with that, will give another dose this afternoon. Encourage IS use.   Best practice:  Diet: full. TPN Pain/Anxiety/Delirium protocol (if indicated): As needed VAP protocol (if indicated): Not applicable DVT prophylaxis: Heparin gtt GI prophylaxis: PPI Glucose control: Monitor Mobility: Bedrest Code Status: Full code Family Communication: Patient's husband updated at bedside 7/28 Disposition: ICU  Labs    CBC: Recent Labs  Lab 10/21/19 2204 10/21/19 2204 10/22/19 1949 10/24/19 0142 10/25/19 1131 10/26/19 0508 10/27/19 0240 10/27/19 0242 10/28/19 0511  WBC 4.4   < > 8.0  --  14.3* 11.4*  --  19.4* 13.7*  NEUTROABS 3.7  --   --   --   --  10.3*  --   --   --   HGB 10.4*   < > 10.7*   < > 8.1* 7.3* 8.5* 8.0* 7.5*  HCT 30.7*   < > 32.0*   < > 24.4* 22.6* 25.0* 25.6* 23.7*  MCV 94.5   < > 95.0  --  96.4 100.0  --  102.4* 100.0  PLT 353   < > 302  --  246 223  --  243 208   < > = values in this interval not displayed.    Basic Metabolic Panel: Recent Labs  Lab 10/21/19 2204 10/22/19 1949 10/25/19 1131 10/25/19 1131 10/26/19 0508 10/27/19 0240 10/27/19 0807 10/27/19 1820 10/28/19 0511  NA 132*   < > 135   < > 133* 138 138 139 136  K 3.8   < > 3.6   < > 4.3 3.5 2.8* 3.3* 3.5  CL 101   < > 106  --  104  --  101 100 100  CO2 22   < > 22  --  25  --  27 30 29   GLUCOSE 114*   < > 112*  --  414*  --  161* 114* 123*  BUN 6   < > 7  --  5*  --  <5* <5* 5*  CREATININE 0.43*   < >  0.57  --  0.52  --  0.48 0.40* 0.35*  CALCIUM 7.3*   < > 7.1*  --  6.8*  --  7.1* 7.2* 7.1*  MG  --   --  1.6*  --  2.1  --   --   --  1.8  PHOS 4.0  --  3.9  --  4.0  --   --   --  1.9*   < > = values in this interval not displayed.   GFR: Estimated Creatinine Clearance: 75.5 mL/min (A) (by C-G formula based on SCr of 0.35 mg/dL (L)).  Mitzi Hansen, MD Internal Medicine Program PGY 2 10/28/19 7:36 AM

## 2019-10-28 NOTE — Progress Notes (Addendum)
4 Days Post-Op   Subjective/Chief Complaint: On high flow nasal cannula. Started having liquid stool via ostomy. Tolerating liquids but reports early satiety and some stomach discomfort. Denies nausea or emesis. Per patient and RN she has had some issues with pain, worse with movement.  Reports occasional diarrhea when she eats dairy products.  Her husband is at bedside.  I encouraged her to work with therapies and WOC RN.   Afebrile, VSS WBC 13.7 from 19 Hgb/hct stable 7.5/23.7 Objective: Vital signs in last 24 hours: Temp:  [97.5 F (36.4 C)-98.6 F (37 C)] 98.1 F (36.7 C) (07/28 0400) Pulse Rate:  [78-114] 78 (07/28 0700) Resp:  [9-20] 15 (07/28 0700) BP: (84-117)/(49-84) 98/77 (07/28 0700) SpO2:  [88 %-100 %] 95 % (07/28 0700) FiO2 (%):  [50 %-60 %] 60 % (07/28 0120) Last BM Date: 10/27/19  Intake/Output from previous day: 07/27 0701 - 07/28 0700 In: 2004.3 [I.V.:1497.3; IV Piggyback:506.9] Out: 7544 [Urine:4100; Drains:475; Stool:400] Intake/Output this shift: No intake/output data recorded.  General appearance: alert and cooperative Resp: HFNC to maintain sats >90%, crackles bilateral lung bases  Cardio: regular rate and rhythm GI: soft, abdominal dressing c/d/i, ostomy pink with liquid stool and gas in pouch. Drain output turbid serous fluid (475/24h) Extremities: anasarca, palpable pedal pulses   Lab Results:  Recent Labs    10/27/19 0242 10/28/19 0511  WBC 19.4* 13.7*  HGB 8.0* 7.5*  HCT 25.6* 23.7*  PLT 243 208   BMET Recent Labs    10/27/19 1820 10/28/19 0511  NA 139 136  K 3.3* 3.5  CL 100 100  CO2 30 29  GLUCOSE 114* 123*  BUN <5* 5*  CREATININE 0.40* 0.35*  CALCIUM 7.2* 7.1*   PT/INR No results for input(s): LABPROT, INR in the last 72 hours. ABG Recent Labs    10/27/19 0240  PHART 7.351  HCO3 20.3    Studies/Results: DG CHEST PORT 1 VIEW  Result Date: 10/27/2019 CLINICAL DATA:  Shortness of breath and hypoxia EXAM: PORTABLE  CHEST 1 VIEW COMPARISON:  10/18/2019 FINDINGS: Cardiac shadow is stable. Right-sided PICC line is noted with the catheter tip at the cavoatrial junction. Increasing bilateral pleural effusions and parenchymal edema are seen when compared with the prior exam consistent progressive congestive failure. IMPRESSION: Changes of progressive congestive failure with effusions and parenchymal edema. Electronically Signed   By: Inez Catalina M.D.   On: 10/27/2019 02:37   ECHOCARDIOGRAM COMPLETE  Result Date: 10/26/2019    ECHOCARDIOGRAM REPORT   Patient Name:   Natasha HENDRIE Tarnow Date of Exam: 10/26/2019 Medical Rec #:  [id number]       Height:       61.0 in Accession #:    [id number]      Weight:       169.8 lb Date of Birth:  1965-04-21       BSA:          1.762 m Patient Age:    42 years        BP:           100/84 mmHg Patient Gender: F               HR:           83 bpm. Exam Location:  Inpatient Procedure: 2D Echo and Intracardiac Opacification Agent Indications:     dyspnea 786.09  History:         Patient has no prior history of Echocardiogram examinations.  Lower extremity edema; Risk Factors:Hypertension.  Sonographer:     Johny Chess Referring Phys:  [place] Diagnosing Phys: Eleonore Chiquito MD IMPRESSIONS  1. Left ventricular ejection fraction, by estimation, is 50 to 55%. The left ventricle has low normal function. The left ventricle has no regional wall motion abnormalities. Left ventricular diastolic function could not be evaluated.  2. Right ventricular systolic function is normal. The right ventricular size is normal.  3. Moderate pleural effusion in the left lateral region.  4. The mitral valve is grossly normal. Trivial mitral valve regurgitation. No evidence of mitral stenosis.  5. The aortic valve was not well visualized. Aortic valve regurgitation is not visualized. No aortic stenosis is present. FINDINGS  Left Ventricle: Left ventricular ejection fraction, by estimation, is  50 to 55%. The left ventricle has low normal function. The left ventricle has no regional wall motion abnormalities. Definity contrast agent was given IV to delineate the left ventricular endocardial borders. The left ventricular internal cavity size was normal in size. There is no left ventricular hypertrophy. Left ventricular diastolic function could not be evaluated due to nondiagnostic images. Left ventricular diastolic function could not be evaluated. Right Ventricle: The right ventricular size is normal. No increase in right ventricular wall thickness. Right ventricular systolic function is normal. Left Atrium: Left atrial size was normal in size. Right Atrium: Right atrial size was normal in size. Pericardium: Trivial pericardial effusion is present. Presence of pericardial fat pad. Mitral Valve: The mitral valve is grossly normal. Trivial mitral valve regurgitation. No evidence of mitral valve stenosis. Tricuspid Valve: The tricuspid valve is grossly normal. Tricuspid valve regurgitation is trivial. No evidence of tricuspid stenosis. Aortic Valve: The aortic valve was not well visualized. Aortic valve regurgitation is not visualized. No aortic stenosis is present. Pulmonic Valve: The pulmonic valve was grossly normal. Pulmonic valve regurgitation is not visualized. No evidence of pulmonic stenosis. Aorta: The aortic root is normal in size and structure. Venous: The inferior vena cava was not well visualized. IAS/Shunts: The atrial septum is grossly normal. Additional Comments: There is a moderate pleural effusion in the left lateral region.  LEFT VENTRICLE PLAX 2D LVIDd:         4.30 cm  Diastology LVIDs:         3.40 cm  LV e' lateral:   9.68 cm/s LV PW:         0.60 cm  LV E/e' lateral: 10.0 LV IVS:        0.60 cm  LV e' medial:    10.40 cm/s LVOT diam:     1.80 cm  LV E/e' medial:  9.3 LVOT Area:     2.54 cm  RIGHT VENTRICLE RV S prime:     14.00 cm/s TAPSE (M-mode): 2.3 cm LEFT ATRIUM             Index  LA diam:        3.50 cm 1.99 cm/m LA Vol (A2C):   28.8 ml 16.35 ml/m LA Vol (A4C):   25.0 ml 14.19 ml/m LA Biplane Vol: 27.7 ml 15.72 ml/m   AORTA Ao Root diam: 2.90 cm MITRAL VALVE               TRICUSPID VALVE MV Area (PHT): 3.46 cm    TR Peak grad:   35.0 mmHg MV Decel Time: 219 msec    TR Vmax:        296.00 cm/s MV E velocity: 96.70 cm/s MV A  velocity: 91.20 cm/s  SHUNTS MV E/A ratio:  1.06        Systemic Diam: 1.80 cm Eleonore Chiquito MD Electronically signed by Eleonore Chiquito MD Signature Date/Time: 10/26/2019/1:44:11 PM    Final (Updated)     Anti-infectives: Anti-infectives (From admission, onward)   Start     Dose/Rate Route Frequency Ordered Stop   10/24/19 0600  piperacillin-tazobactam (ZOSYN) IVPB 3.375 g     Discontinue     3.375 g 12.5 mL/hr over 240 Minutes Intravenous Every 8 hours 10/23/19 2305     10/24/19 0000  piperacillin-tazobactam (ZOSYN) IVPB 4.5 g  Status:  Discontinued        4.5 g 200 mL/hr over 30 Minutes Intravenous Every 6 hours 10/23/19 2257 10/23/19 2302   10/23/19 2315  piperacillin-tazobactam (ZOSYN) IVPB 3.375 g        3.375 g 100 mL/hr over 30 Minutes Intravenous STAT 10/23/19 2302 10/23/19 2347     Assessment/Plan: 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 Henrene Pastor, per GI, started Solumedrol 7/27 Sigmoid colon perforation - S/P partial colectomy and end colostomy by Dr. Kae Heller 7/24. Ileus anticipated. Sips of clears, continue TPN, POD#4. WBC down-trending. Monitor for signs of abscess/leak given recent addition of IV steroids. Schedule APAP and robaxin for better pain control. PRN oxycodone. HM for breakthrough. Sepsis - per primary team, off pressors  Acute hypoxic respiratory failure   FEN: TNA, advance to SOFT diet + ensure/boost, I would continue TNA at full support until the patient is reliable taking in enough calories/protein PO, could consider calorie count starting  tomorrow or Friday if her PO intake remains poor. ID: Zosyn; from a CCS perspective could stop abx after POD#4 (end of the day today) per STOP-IT trial, source control obtained 7/24.    VTE: SCD's, hep gtt Foley: removed 7/26, voiding spontaneously  Dispo: ICU     LOS: 9 days     Jill Alexanders 10/28/2019

## 2019-10-29 ENCOUNTER — Inpatient Hospital Stay (HOSPITAL_COMMUNITY): Payer: 59

## 2019-10-29 DIAGNOSIS — K50118 Crohn's disease of large intestine with other complication: Secondary | ICD-10-CM | POA: Diagnosis not present

## 2019-10-29 DIAGNOSIS — I7419 Embolism and thrombosis of other parts of aorta: Secondary | ICD-10-CM | POA: Diagnosis not present

## 2019-10-29 DIAGNOSIS — E46 Unspecified protein-calorie malnutrition: Secondary | ICD-10-CM | POA: Diagnosis not present

## 2019-10-29 DIAGNOSIS — L8990 Pressure ulcer of unspecified site, unspecified stage: Secondary | ICD-10-CM | POA: Diagnosis present

## 2019-10-29 DIAGNOSIS — E8809 Other disorders of plasma-protein metabolism, not elsewhere classified: Secondary | ICD-10-CM | POA: Diagnosis not present

## 2019-10-29 LAB — CBC
HCT: 22.3 % — ABNORMAL LOW (ref 36.0–46.0)
Hemoglobin: 7.2 g/dL — ABNORMAL LOW (ref 12.0–15.0)
MCH: 32.9 pg (ref 26.0–34.0)
MCHC: 32.3 g/dL (ref 30.0–36.0)
MCV: 101.8 fL — ABNORMAL HIGH (ref 80.0–100.0)
Platelets: 226 10*3/uL (ref 150–400)
RBC: 2.19 MIL/uL — ABNORMAL LOW (ref 3.87–5.11)
RDW: 19.6 % — ABNORMAL HIGH (ref 11.5–15.5)
WBC: 13.5 10*3/uL — ABNORMAL HIGH (ref 4.0–10.5)
nRBC: 0.1 % (ref 0.0–0.2)

## 2019-10-29 LAB — COMPREHENSIVE METABOLIC PANEL
ALT: 37 U/L (ref 0–44)
AST: 27 U/L (ref 15–41)
Albumin: 1.4 g/dL — ABNORMAL LOW (ref 3.5–5.0)
Alkaline Phosphatase: 96 U/L (ref 38–126)
Anion gap: 5 (ref 5–15)
BUN: 7 mg/dL (ref 6–20)
CO2: 31 mmol/L (ref 22–32)
Calcium: 7.2 mg/dL — ABNORMAL LOW (ref 8.9–10.3)
Chloride: 98 mmol/L (ref 98–111)
Creatinine, Ser: 0.34 mg/dL — ABNORMAL LOW (ref 0.44–1.00)
GFR calc Af Amer: >60 mL/min (ref >60)
GFR calc non Af Amer: >60 mL/min (ref >60)
Glucose, Bld: 131 mg/dL — ABNORMAL HIGH (ref 70–99)
Potassium: 3.9 mmol/L (ref 3.5–5.1)
Sodium: 134 mmol/L — ABNORMAL LOW (ref 135–145)
Total Bilirubin: 0.4 mg/dL (ref 0.3–1.2)
Total Protein: 3.9 g/dL — ABNORMAL LOW (ref 6.5–8.1)

## 2019-10-29 LAB — GLUCOSE, CAPILLARY
Glucose-Capillary: 151 mg/dL — ABNORMAL HIGH (ref 70–99)
Glucose-Capillary: 109 mg/dL — ABNORMAL HIGH (ref 70–99)
Glucose-Capillary: 137 mg/dL — ABNORMAL HIGH (ref 70–99)
Glucose-Capillary: 139 mg/dL — ABNORMAL HIGH (ref 70–99)
Glucose-Capillary: 122 mg/dL — ABNORMAL HIGH (ref 70–99)

## 2019-10-29 LAB — PHOSPHORUS: Phosphorus: 3 mg/dL (ref 2.5–4.6)

## 2019-10-29 LAB — MAGNESIUM: Magnesium: 2.2 mg/dL (ref 1.7–2.4)

## 2019-10-29 LAB — HEPARIN LEVEL (UNFRACTIONATED)
Heparin Unfractionated: 0.96 IU/mL — ABNORMAL HIGH (ref 0.30–0.70)
Heparin Unfractionated: 0.83 IU/mL — ABNORMAL HIGH (ref 0.30–0.70)
Heparin Unfractionated: 0.53 IU/mL (ref 0.30–0.70)

## 2019-10-29 MED ORDER — FUROSEMIDE 10 MG/ML IJ SOLN
40.0000 mg | Freq: Once | INTRAMUSCULAR | Status: AC
  Administered 2019-10-29: 40 mg via INTRAVENOUS
  Filled 2019-10-29: qty 4

## 2019-10-29 MED ORDER — IOHEXOL 350 MG/ML SOLN
100.0000 mL | Freq: Once | INTRAVENOUS | Status: AC | PRN
  Administered 2019-10-29: 100 mL via INTRAVENOUS

## 2019-10-29 MED ORDER — TRAVASOL 10 % IV SOLN
INTRAVENOUS | Status: AC
  Filled 2019-10-29: qty 900

## 2019-10-29 MED ORDER — ACETAMINOPHEN 500 MG PO TABS
500.0000 mg | ORAL_TABLET | Freq: Four times a day (QID) | ORAL | Status: DC | PRN
  Administered 2019-10-29 – 2019-11-03 (×4): 500 mg via ORAL
  Filled 2019-10-29 (×5): qty 1

## 2019-10-29 NOTE — Progress Notes (Signed)
PHARMACY - TOTAL PARENTERAL NUTRITION CONSULT NOTE   Indication: Prolonged ileus  Patient Measurements: Height: 5' 1"  (154.9 cm) Weight: 77 kg (169 lb 12.1 oz) IBW/kg (Calculated) : 47.8 TPN AdjBW (KG): 55.1 Body mass index is 32.07 kg/m.  Assessment:  46 yof with remote history of sigmoid colectomy and recent admission d/t colitis initially presented with lower extremity edema and later developed abdominal pain with decreased appetite. S/p EGD + colonoscopy on 7/21 that showed Crohn's colitis. On 7/23 a CT showed large amount pneumoperitoneum and patients underwent exlap with partial colectomy and colostomy. To start TPN for anticipated prolonged ileus.   Glucose / Insulin: CBGs 129-189, Received 20 units of rSSI in 24 hours, on solumedrol  Electrolytes: Phos 3, Mg 2.2, K normalized with aggressive repletion. Other lytes wnl  Renal: SCr 0.34 LFTs / TGs: LFTs WNL Prealbumin / albumin: prealbumin <5, albumin 1.4 Intake / Output; MIVF: 349m drain output, 350 mL colostomy output, UOP 2.573mkg/hr GI Imaging: 7/23 Abd xray - ileus vs SBO 7/23 CT abd - Extensive pneumoperitoneum, most consistent with colonic perforation given the persistent diffuse colitis Surgeries / Procedures:  7/21 Colonoscopy/EGD 7/24 Exlap, partial colectomy and colostomy  Central access: PICC placed 7/25 TPN start date: 7/25  Nutritional Goals (per RD recommendation on 7/22): kCal: 1700-1900, Protein: 90-105g, Fluid: >1.7L  Goal TPN rate is 75 mL/hr (provides 90 g of protein and 1849 kcals per day)  Current Nutrition:  Clear liquids and Oral supplements  TPN  Plan:  Continue TPN at goal rate of 7574mr at 1800 - provides 90 g of protein and 1849 kcals per day (100% goal calories)  Electrolytes in TPN:  Na 72m50m of Na, K 40mE52m 5mEq/73mf Ca, 5mEq/L89m Mg, and 15mmol/19m Phos. Cl:Ac ratio 1:1 Add standard MVI and trace elements to TPN Continue Resistant q4h SSI and adjust as needed  Monitor TPN labs  on Mon/Thurs F/u Bmet tomorrow    BenjaminAlbertina Parr., BCPS, BCCCP Clinical Pharmacist Clinical phone for 10/29/19 until 3:30pm: [phone] 3:30pm, please refer to [facility]ist

## 2019-10-29 NOTE — Progress Notes (Signed)
ANTICOAGULATION CONSULT NOTE  Pharmacy Consult for heparin Indication: mural thrombus  No Known Allergies  Patient Measurements: Height: 5' 1"  (154.9 cm) Weight: 77 kg (169 lb 12.1 oz) IBW/kg (Calculated) : 47.8 Heparin Dosing Weight: 64.9kg  Vital Signs: Temp: 98.2 F (36.8 C) (07/29 1945) Temp Source: Oral (07/29 1945) BP: 91/59 (07/29 1945) Pulse Rate: 80 (07/29 1945)  Labs: Recent Labs    10/27/19 0242 10/27/19 0807 10/28/19 0511 10/28/19 1601 10/28/19 1743 10/29/19 0504 10/29/19 1423 10/29/19 2123  HGB 8.0*  --  7.5*  --   --  7.2*  --   --   HCT 25.6*  --  23.7*  --   --  22.3*  --   --   PLT 243  --  208  --   --  226  --   --   HEPARINUNFRC 0.89*   < > 0.76*  --    < > 0.96* 0.83* 0.53  CREATININE  --    < > 0.35* 0.36*  --  0.34*  --   --    < > = values in this interval not displayed.    Estimated Creatinine Clearance: 74.6 mL/min (A) (by C-G formula based on SCr of 0.34 mg/dL (L)).  Assessment: 28 yof found to have a mural thrombus on IV heparin for anticoagulation.  Heparin level therapeutic; no bleeding reported.  Goal of Therapy:  Heparin level 0.3-0.7 units/ml Monitor platelets by anticoagulation protocol: Yes   Plan:  Continue heparin gtt at 1050 units/hr F/U AM labs  Nicola Heinemann D. Mina Marble, PharmD, BCPS, [place] 10/29/2019, 10:30 PM

## 2019-10-29 NOTE — Progress Notes (Signed)
[facility]  Subjective  - no complaints.   Objective (!) 85/55 93 98.6 F (37 C) (Oral) 21 97%  Intake/Output Summary (Last 24 hours) at 10/29/2019 1316 Last data filed at 10/29/2019 1200 Gross per 24 hour  Intake 2982.73 ml  Output 5230 ml  Net -2247.27 ml    Palpable DP pulses bilaterally  Laboratory Lab Results: Recent Labs    10/28/19 0511 10/29/19 0504  WBC 13.7* 13.5*  HGB 7.5* 7.2*  HCT 23.7* 22.3*  PLT 208 226   BMET Recent Labs    10/28/19 1601 10/29/19 0504  NA 134* 134*  K 3.6 3.9  CL 96* 98  CO2 30 31  GLUCOSE 151* 131*  BUN 6 7  CREATININE 0.36* 0.34*  CALCIUM 7.1* 7.2*    COAG Lab Results  Component Value Date   INR 1.13 10/25/2011   No results found for: PTT  Assessment/Planning:  Reviewed repeat CTA abdomen pelvis today and infra-renal thrombus stable and slightly improved.  Continue anticoagulation and tolerating heparin gtt.  Will arrange follow-up likely as outpatient for continued imaging and surveillance.  Marty Heck 10/29/2019 1:16 PM --

## 2019-10-29 NOTE — Progress Notes (Addendum)
5 Days Post-Op   Subjective/Chief Complaint: On 9L nasal cannula. Pain improving. Had half a bagel this morning. Had mild nausea yesterday but none currently.   Afebrile, VSS WBC 13.4 fron 13.7, stable Hgb/hct stable 7.2/22.3  Objective: Vital signs in last 24 hours: Temp:  [97.6 F (36.4 C)-98.1 F (36.7 C)] 97.9 F (36.6 C) (07/29 0800) Pulse Rate:  [64-106] 73 (07/29 0900) Resp:  [10-20] 12 (07/29 0900) BP: (81-122)/(48-95) 90/58 (07/29 0900) SpO2:  [93 %-100 %] 98 % (07/29 0900) FiO2 (%):  [70 %] 70 % (07/28 1121) Last BM Date: 10/29/19  Intake/Output from previous day: 07/28 0701 - 07/29 0700 In: 3480.7 [P.O.:240; I.V.:2287.6; IV Piggyback:953] Out: 0569 [Urine:4550; Drains:305; Stool:350] Intake/Output this shift: Total I/O In: 196.7 [I.V.:171.6; IV Piggyback:25.1] Out: 400 [Urine:300; Drains:100]  General appearance: alert and cooperative Resp: normal effort on [place] Cardio: regular rate and rhythm GI: soft, abdominal dressing c/d/i, ostomy pink with liquid stool and SS drainage in pouch. Drain output turbid serous fluid  Extremities: anasarca, palpable pedal pulses   Lab Results:  Recent Labs    10/28/19 0511 10/29/19 0504  WBC 13.7* 13.5*  HGB 7.5* 7.2*  HCT 23.7* 22.3*  PLT 208 226   BMET Recent Labs    10/28/19 1601 10/29/19 0504  NA 134* 134*  K 3.6 3.9  CL 96* 98  CO2 30 31  GLUCOSE 151* 131*  BUN 6 7  CREATININE 0.36* 0.34*  CALCIUM 7.1* 7.2*   PT/INR No results for input(s): LABPROT, INR in the last 72 hours. ABG Recent Labs    10/27/19 0240  PHART 7.351  HCO3 20.3    Studies/Results: CT Angio Abd/Pel w/ and/or w/o  Result Date: 10/29/2019 CLINICAL DATA:  54 year old history of pneumoperitoneum and a large perforation in the rectosigmoid region. Patient underwent exploratory laparotomy with lysis of adhesion and segmental colectomy with end colostomy. Patient was also noted to have new thrombus in the abdominal aorta on the CT from  10/23/2019. CTA examination performed in order to follow-up the aortic thrombus. EXAM: CT ANGIOGRAPHY ABDOMEN AND PELVIS WITH CONTRAST AND WITHOUT CONTRAST TECHNIQUE: Multidetector CT imaging of the abdomen and pelvis was performed using the standard protocol during bolus administration of intravenous contrast. Multiplanar reconstructed images and MIPs were obtained and reviewed to evaluate the vascular anatomy. CONTRAST:  140m OMNIPAQUE IOHEXOL 350 MG/ML SOLN COMPARISON:  CT abdomen pelvis 10/23/2019 FINDINGS: VASCULAR Aorta: Normal caliber of the distal descending thoracic aorta. Atherosclerotic calcifications involving the juxtarenal and infrarenal abdominal aorta. Negative for an aortic dissection or aneurysm. Again noted is thrombus along the right side of the infrarenal abdominal aortic wall. This is elongated thrombus and measures roughly 1.3 cm in length. The overall length of the thrombus has not significantly changed but the width has decreased measuring roughly 0.3 cm opposed to 0.5 cm. This thrombus is new since 10/02/2019. No significant stenosis in the abdominal aorta. Aortic bifurcation is widely patent. Celiac: Patent without evidence of aneurysm, dissection, vasculitis or significant stenosis. SMA: Patent without evidence of aneurysm, dissection, vasculitis or significant stenosis. Renals: Single left renal artery is widely patent without aneurysm, dissection or significant stenosis. There are 2 right-sided renal arteries. Right renal arteries are patent without aneurysm, dissection or significant stenosis. IMA: Patent without evidence of aneurysm, dissection, vasculitis or significant stenosis. Inflow: Patent without evidence of aneurysm, dissection, vasculitis or significant stenosis. Proximal Outflow: Proximal femoral arteries are patent bilaterally. Veins: Hepatic veins are patent. Portal venous system is patent. IVC and renal  veins are patent. No gross abnormality to the iliac veins. Review of  the MIP images confirms the above findings. NON-VASCULAR Lower chest: Again noted are bilateral pleural effusions. Right effusion has enlarged in size. There may be slight enlargement of the left pleural effusion. Increased compressive atelectasis in both lower lobes. Hepatobiliary: Trace perihepatic ascites. Normal appearance of the gallbladder. No focal liver lesion. No significant biliary dilatation. Pancreas: Unremarkable. No pancreatic ductal dilatation or surrounding inflammatory changes. Spleen: Normal in size without focal abnormality. Adrenals/Urinary Tract: Normal appearance of the adrenal glands. New low-density peripheral wedge-shaped area along the right kidney lower pole. This was not present on the previous examinations and suspect this is related to small infarct based on the aortic thrombus. This area measures up to 1.6 cm. Normal enhancement the left kidney. Normal appearance of the urinary bladder. Stomach/Bowel: Patient now has a Hartmann's pouch which contains high-density contrast. 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 Again noted is a dilated transverse colon with some wall thickening involving the hepatic flexure and mild wall thickening in the ascending colon. Cecum appears to be fluid-filled and poorly defined. No acute abnormality to the stomach or small bowel. Lymphatic: No significant lymph node enlargement in the abdomen or pelvis. Reproductive: Uterus and bilateral adnexa are unremarkable. Other: Right abdominal surgical drain that extends into the right pelvic region. Poorly defined fluid and gas along the anterior abdominal cavity near the midline incision. There is poorly defined fluid along the anterior abdominal cavity measures roughly 4.5 x 0.9 x 2.4 cm. Fluid-filled structure in the right hemipelvic region appears to be related to the cecum rather than extraluminal fluid collection. Small amount of fluid along the lateral left abdomen sequence 13, image 37. Trace  perisplenic fluid. Diffuse subcutaneous edema. Question a small fluid collection between loops of bowel in the mid abdomen on sequence 13, image 51 that measures roughly 1.7 cm. Poorly developed fluid along the posterior aspect of the ascending colon on sequence 13, image 48 measuring up to 1.5 cm. Musculoskeletal: No acute bone abnormality. IMPRESSION: VASCULAR 1. Thrombus along the right side of the infrarenal abdominal aorta has decreased in size compared to the exam on 10/23/2019. Suspect this is related to thromboembolic disease since it was not present on the exam from 10/02/2019. No new arterial thrombus in the abdomen or pelvis. 2.  Aortic Atherosclerosis (ICD10-I70.0). NON-VASCULAR 1. New peripheral wedge-shaped low-density area in the right kidney lower pole. Based on the arterial thrombus, suspect this is related to a small renal infarct. Small focus of pyelonephritis is also in the differential but thought to be less likely. 2. Extensive postoperative changes compatible with partial colectomy and creation of left abdominal colostomy. 3. Scattered small fluid collections in the abdomen. These small fluid collections are poorly defined and could represent postoperative change. Consider surveillance to exclude developing abscess formations. 4. Bilateral pleural effusions with extensive compressive atelectasis at the lung bases. Right pleural effusion has enlarged compared to the exam on 10/23/2019. 5. Persistent wall thickening involving the right colon. Findings are suggestive for bowel inflammation. Electronically Signed   By: Markus Daft M.D.   On: 10/29/2019 08:58    Anti-infectives: Anti-infectives (From admission, onward)   Start     Dose/Rate Route Frequency Ordered Stop   10/24/19 0600  piperacillin-tazobactam (ZOSYN) IVPB 3.375 g     Discontinue     3.375 g 12.5 mL/hr over 240 Minutes Intravenous Every 8 hours 10/23/19 2305     10/24/19 0000  piperacillin-tazobactam (  ZOSYN) IVPB 4.5 g   Status:  Discontinued        4.5 g 200 mL/hr over 30 Minutes Intravenous Every 6 hours 10/23/19 2257 10/23/19 2302   10/23/19 2315  piperacillin-tazobactam (ZOSYN) IVPB 3.375 g        3.375 g 100 mL/hr over 30 Minutes Intravenous STAT 10/23/19 2302 10/23/19 2347     Assessment/Plan: 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 Henrene Pastor, per GI, started Solumedrol 7/27 Sigmoid colon perforation - S/P partial colectomy and end colostomy by Dr. Kae Heller 7/24. SOFT diet, continue TPN, POD#5. WBC stable (13). Monitor for signs of abscess/leak given recent addition of IV steroids. Continue scheduled non-narcotic pain meds, PRN oxy, PRN HM for breakthrough. Sepsis - per primary team, off pressors  Acute hypoxic respiratory failure   FEN: TNA, SOFT diet + ensure/boost, poor PO intake- monitor  ID: Zosyn; from a CCS perspective could stop abx (based on STOP-IT trial), source control obtained 7/24. Further abx recs per GI   VTE: SCD's, hep gtt Foley: removed 7/26, voiding spontaneously  Dispo: ICU     LOS: 10 days     Jill Alexanders 10/29/2019

## 2019-10-29 NOTE — Progress Notes (Addendum)
ANTICOAGULATION CONSULT NOTE - Follow Up Consult  Pharmacy Consult for heparin Indication: mural thrombus  No Known Allergies  Patient Measurements: Height: 5' 1"  (154.9 cm) Weight: 77 kg (169 lb 12.1 oz) IBW/kg (Calculated) : 47.8 Heparin Dosing Weight: 64.9kg  Vital Signs: Temp: 97.9 F (36.6 C) (07/29 0400) Temp Source: Oral (07/29 0400) BP: 96/58 (07/29 0600) Pulse Rate: 90 (07/29 0600)  Labs: Recent Labs    10/27/19 0242 10/27/19 0807 10/28/19 0511 10/28/19 1601 10/28/19 1743 10/29/19 0504  HGB 8.0*  --  7.5*  --   --  7.2*  HCT 25.6*  --  23.7*  --   --  22.3*  PLT 243  --  208  --   --  226  HEPARINUNFRC 0.89*   < > 0.76*  --  0.82* 0.96*  CREATININE  --    < > 0.35* 0.36*  --  0.34*   < > = values in this interval not displayed.    Estimated Creatinine Clearance: 74.6 mL/min (A) (by C-G formula based on SCr of 0.34 mg/dL (L)).  Assessment: 16 yof found to have a mural thrombus on IV heparin for anticoagulation.   Currently on IV heparin at 1150 units/hr. HL has been fluctuating a bit at current rate and remains elevated this AM. H/H low, Plt wnl. No s/s of bleeding noted per RN   Goal of Therapy:  Heparin level 0.3-0.7 units/ml Monitor platelets by anticoagulation protocol: Yes   Plan:  Decrease heparin gtt to 1050 units/hr - NO BOLUS d/t recent surgery F/u HL in 6 hours  Daily heparin level and CBC  Albertina Parr, PharmD., BCPS, BCCCP Clinical Pharmacist Clinical phone for 10/29/19 until 3:30pm: [phone] If after 3:30pm, please refer to [facility] for unit-specific pharmacist   Addendum: Repeat HL remains supratherapeutic. Decrease heparin drip to 1000 units/hr and f/u 6 hr HL  Albertina Parr, PharmD., BCPS, BCCCP Clinical Pharmacist Clinical phone for 10/29/19 until 3:30pm: [phone] If after 3:30pm, please refer to [facility] for unit-specific pharmacist

## 2019-10-29 NOTE — Progress Notes (Signed)
Physical Therapy Treatment Patient Details Name: Natasha Barrera MRN: [id number] DOB: 1965/05/24 Today's Date: 10/29/2019    History of Present Illness Natasha Barrera is a 54 year old female with a past medical history significant for hypertension and diverticulitis (status post sigmoid colectomy 2015) who originally presented with complaints of lower extremity edema with later reports of abdominal pain and decreased appetite.  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    PT Comments    Patient progressing with mobility this session able to ambulate some in the room but quickly fatigues and anxious with SpO2 down to 80% on 6L O2, but quickly back to 92 as RN increased O2.  She also had some elevated HR with sit to stand practice.  Feel she may need CIR level rehab at d/c.  PT to follow.    Follow Up Recommendations  CIR     Equipment Recommendations  Rolling walker with 5" wheels;3in1 (PT)    Recommendations for Other Services       Precautions / Restrictions Precautions Precautions: Fall Precaution Comments: JP drain R flank, colostomy Restrictions Weight Bearing Restrictions: No    Mobility  Bed Mobility Overal bed mobility: Needs Assistance Bed Mobility: Supine to Sit     Supine to sit: Min assist;HOB elevated     General bed mobility comments: pulling up with PT assist, managing to move legs off bed using hands to help  Transfers Overall transfer level: Needs assistance Equipment used: Rolling walker (2 wheeled) Transfers: Sit to/from [facility] Sit to Stand: Mod assist Stand pivot transfers: Min assist       General transfer comment: lifting help from EOB and from [facility], stand step with RW and min A for balance, increased  time  Ambulation/Gait Ambulation/Gait assistance: Min assist;+2 safety/equipment Gait Distance (Feet): 10 Feet Assistive device: Rolling walker (2 wheeled) Gait Pattern/deviations: Step-to pattern;Step-through pattern;Decreased stride length;Trunk flexed;Wide base of support     General Gait Details: fatigued quickly and SpO2 dropping to 80% on 6L O2 RN in to increase O2 and pt quickly recovered,  Practiced sit to stand x 2 and pt with HR up to 129 so did not continue   Stairs             Wheelchair Mobility    Modified Rankin (Stroke Patients Only)       Balance Overall balance assessment: Needs assistance   Sitting balance-Leahy Scale: Fair     Standing balance support: Bilateral upper extremity supported Standing balance-Leahy Scale: Poor                              Cognition Arousal/Alertness: Awake/alert Behavior During Therapy: Anxious Overall Cognitive Status: Within Functional Limits for tasks assessed                                        Exercises General Exercises - Lower Extremity Ankle Circles/Pumps: AROM;10 reps;Supine;Both Heel Slides: AROM;5 reps;Supine;Both    General Comments General comments (skin integrity, edema, etc.): BP 80/57 in supine, RN reports to recheck at EOB and pt performed LE therex then sitting EOB BP 92/78      Pertinent Vitals/Pain Pain  Assessment: Faces Faces Pain Scale: Hurts little more Pain Descriptors / Indicators: Sore;Tender Pain Intervention(s): Monitored during session;Repositioned    Home Living                      Prior Function            PT Goals (current goals can now be found in the care plan section) Progress towards PT goals: Progressing toward goals    Frequency    Min 3X/week      PT Plan Current plan remains appropriate    Co-evaluation              AM-PAC PT "6 Clicks" Mobility   Outcome Measure  Help needed turning from your back to  your side while in a flat bed without using bedrails?: A Little Help needed moving from lying on your back to sitting on the side of a flat bed without using bedrails?: A Lot Help needed moving to and from a bed to a chair (including a wheelchair)?: A Little Help needed standing up from a chair using your arms (e.g., wheelchair or bedside chair)?: A Lot Help needed to walk in hospital room?: A Lot Help needed climbing 3-5 steps with a railing? : Total 6 Click Score: 13    End of Session Equipment Utilized During Treatment: Oxygen Activity Tolerance: Patient limited by fatigue;Other (comment) (and anxiety) Patient left: in chair;with call bell/phone within reach Nurse Communication: Mobility status PT Visit Diagnosis: Other abnormalities of gait and mobility (R26.89);Muscle weakness (generalized) (M62.81)     Time: 2633-3545 PT Time Calculation (min) (ACUTE ONLY): 27 min  Charges:  $Gait Training: 8-22 mins $Therapeutic Activity: 8-22 mins                     Magda Kiel, PT Acute Rehabilitation Services Pager:[phone] [phone] 10/29/2019    Natasha Barrera 10/29/2019, 2:34 PM

## 2019-10-29 NOTE — Progress Notes (Signed)
eLink Physician-Brief Progress Note Patient Name: Natasha Barrera DOB: June 20, 1965 MRN: [id number]   Date of Service  10/29/2019  HPI/Events of Note  Radiology asking to change CTA abd to CTA abd/pelvis.   eICU Interventions  Ordered as above .     Intervention Category Minor Interventions: Other:  Elmer Sow 10/29/2019, 5:31 AM

## 2019-10-29 NOTE — Progress Notes (Signed)
NAME:  Natasha Barrera, MRN:  [id number], DOB:  1966/03/17, LOS: 28 ADMISSION DATE:  10/18/2019, CONSULTATION DATE: 10/24/2019 REFERRING MD:  Dr. Philipp Ovens, CHIEF COMPLAINT: Hypertension  Brief History   54 year old female admitted with lower extremity edema and abdominal pain, work-up during admission revealed Crohn's disease confirmed with upper and lower endoscopy and pathological results.  Underwent exploratory laparotomy with lysis of adhesions due to pneumoperitoneum early morning of 7/24.  PCCM consulted for persistent hypotension afternoon of 7/24  History of present illness   Natasha Barrera is a 54 year old female with a past medical history significant for hypertension and diverticulitis (status post sigmoid colectomy 2015 ) who originally presented with complaints of lower extremity edema with later reports of abdominal pain and decreased appetite.  Patient underwent upper and lower endoscopy 7/29 with Dr. Henrene Pastor that showed findings concerning for Crohn's colitis, this was confirmed with pathological report.    Afternoon of 7/23 patient developed acute onset upper abdominal pain resulting in urgent CT of the abdomen that revealed large amount of pneumoperitoneum for which patient underwent exploratory laparotomy with lysis of adhesions and segmental colectomy with end colostomy by general surgery  Post surgery patient was seen with progressive hypotension despite IV hydration and IV albumin in the setting of severe hypoalbuminemia.  PCCM consulted for further management and possible need of vasopressors.  Past Medical History  Hypertension Diverticulitis  Significant Hospital Events   Admitted 7/18  Consults:  General surgery Gastroenterology PCCM Vascular surgery  Procedures:  7/24 exploratory laparotomy with lysis of adhesions and segmental colectomy with end colostomy by general surgery  7/24 PICC line >  Significant Diagnostic Tests:  CT angio chest 7/18 > negative for  acute PE, emphysema  CT abdomen and pelvis 7/23 >  1.extensive pneumoperitoneum 2.  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  Small bilateral pleural effusions 4.  Diffuse hepatic steatosis  Micro Data:  Covid 7/18 > negative  C. Difficile 7/20 > negative Surgical PCR 7/23 > negative  Antimicrobials:  Zosyn 7/24 >  Interim history/subjective:  No significant overnight events  Objective   Blood pressure (!) 101/60, pulse 96, temperature 97.9 F (36.6 C), temperature source Oral, resp. rate 15, height 5' 1"  (1.549 m), weight 77 kg, last menstrual period 12/15/2014, SpO2 94 %.    FiO2 (%):  [70 %] 70 %   Intake/Output Summary (Last 24 hours) at 10/29/2019 0815 Last data filed at 10/29/2019 0800 Gross per 24 hour  Intake 3171.26 ml  Output 5455 ml  Net -2283.74 ml   Filed Weights   10/18/19 1620 10/24/19 0452  Weight: 53.5 kg 77 kg    Examination: General: chronically ill appearing HEENT: normal Cardiac: RRR.  Pulm: on 6L HFNC. Lung sounds improved. Still diminished at the bases but less crackles overall. Abd: non-distended. Colostomy bag with dark brown liquid stool. 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 GI mentioning possible role of prolonged flagyl tid course to promote mucosal healing. -will additionally need HBV vaccine series -quantiferon test indeterminate Leukocytosis attributable to inflammatory  changes. Afebrile.  Malnutrition with  hypoalbuminemia P: Primary management per gen surgery, appreciate assistance -Continue tpn -regular diet -d/c zosyn -Steroids per surgery -do not suspect that leukocytosis in related to active infection at this time. Repeat CT not indicated at this time however would re-scan her if she develops s/s of infectioin.  Infrarenal aortic thrombus. Incidentally noted on 7/23 CT. Follow up scan today showing slight decrease in size. P: continue heparin. Appreciate vascular surgery's input.  Post operative Anemia. Slowly trending down. 7.2 this morning. No obvious source of major active bleeding at this time. Plan: continue monitoring hgb. Transfuse for hemoglobin <7.   Acute hypoxic respiratory failure secondary to pulm edema and moderate-large bilateral pleural effusions. Down to 6L HFNC at this time. Given her improvement with her respiratory status, I do not think thoracentesis is needed at this time.  Volume status is improving as well with IV lasix. Plan: Continue to wean supplemental oxygen as able. 98m IV lasix this morning.   Best practice:  Diet: full. TPN Pain/Anxiety/Delirium protocol (if indicated): As needed VAP protocol (if indicated): Not applicable DVT prophylaxis: Heparin gtt GI prophylaxis: PPI Glucose control: Monitor Mobility: Bedrest Code Status: Full code Family Communication: Patient's husband updated at bedside 7/28 Disposition: ICU  Labs   CBC: Recent Labs  Lab 10/25/19 1131 10/25/19 1131 10/26/19 0508 10/27/19 0240 10/27/19 0242 10/28/19 0511 10/29/19 0504  WBC 14.3*  --  11.4*  --  19.4* 13.7* 13.5*  NEUTROABS  --   --  10.3*  --   --   --   --   HGB 8.1*   < > 7.3* 8.5* 8.0* 7.5* 7.2*  HCT 24.4*   < > 22.6* 25.0* 25.6* 23.7* 22.3*  MCV 96.4  --  100.0  --  102.4* 100.0 101.8*  PLT 246  --  223  --  243 208 226   < > = values in this interval not displayed.    Basic Metabolic Panel: Recent Labs  Lab 10/25/19 1131 10/25/19 1131 10/26/19 0508  10/27/19 0240 10/27/19 0807 10/27/19 1820 10/28/19 0511 10/28/19 1601 10/29/19 0504  NA 135   < > 133*   < > 138 139 136 134* 134*  K 3.6   < > 4.3   < > 2.8* 3.3* 3.5 3.6 3.9  CL 106   < > 104  --  101 100 100 96* 98  CO2 22   < > 25  --  27 30 29 30 31   GLUCOSE 112*   < > 414*  --  161* 114* 123* 151* 131*  BUN 7   < > 5*  --  <5* <5* 5* 6 7  CREATININE 0.57   < > 0.52  --  0.48 0.40* 0.35* 0.36* 0.34*  CALCIUM 7.1*   < > 6.8*  --  7.1* 7.2* 7.1* 7.1* 7.2*  MG 1.6*  --  2.1  --   --   --  1.8  --  2.2  PHOS 3.9  --  4.0  --   --   --  1.9*  --  3.0   < > = values in this interval not displayed.   GFR: Estimated Creatinine Clearance: 74.6 mL/min (A) (by C-G formula based on SCr of 0.34 mg/dL (L)).  RMitzi Hansen MD Internal Medicine Resident PGY-2 [facility] Res[facility] Pager: [phone] 09297/29/2021 9:27 AM

## 2019-10-29 NOTE — Progress Notes (Signed)
Report given to [facility]. Patient moved to 4NP 02 with all belongings.  Left voicemail with husband Mikki Santee of transfer.

## 2019-10-29 NOTE — Evaluation (Signed)
Occupational Therapy Evaluation Patient Details Name: Natasha Barrera MRN: [id number] DOB: [birth year] Today's Date: 10/29/2019    History of Present Illness Natasha Barrera is a 54 year old female with a past medical history significant for hypertension and diverticulitis (status post sigmoid colectomy 2015) who originally presented with complaints of lower extremity edema with later reports of abdominal pain and decreased appetite.  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   Clinical Impression   Pt admitted with above. She demonstrates the below listed deficits and will benefit from continued OT to maximize safety and independence with BADLs.  Pt presents to OT with generalized weakness, decreased activity tolerance, impaired balance, and pain.  She currently requires min - max A for ADLs, and mod A for functional mobility.  She reports she lives with her spouse who works from home and can assist her as needed.  She reports she was fully independent PTA.       Follow Up Recommendations  CIR;Supervision/Assistance - 24 hour    Equipment Recommendations  None recommended by OT    Recommendations for Other Services Rehab consult     Precautions / Restrictions Precautions Precautions: Fall Precaution Comments: JP drain R flank, colostomy Restrictions Weight Bearing Restrictions: No      Mobility Bed Mobility Overal bed mobility: Needs Assistance Bed Mobility: Supine to Sit;Sit to Sidelying     Supine to sit: Min assist;HOB elevated   Sit to sidelying: Mod assist General bed mobility comments: assist for LEs   Transfers Overall transfer level: Needs assistance Equipment used: Rolling walker (2 wheeled) Transfers: Sit to/from Stand Sit to Stand: Mod  assist Stand pivot transfers: Min assist       General transfer comment: assist to boost hips     Balance Overall balance assessment: Needs assistance Sitting-balance support: Feet supported Sitting balance-Leahy Scale: Fair     Standing balance support: Bilateral upper extremity supported Standing balance-Leahy Scale: Poor Standing balance comment: UE support for balance with LE weakness                           ADL either performed or assessed with clinical judgement   ADL Overall ADL's : Needs assistance/impaired Eating/Feeding: Set up;Bed level   Grooming: Wash/dry hands;Wash/dry face;Oral care;Brushing hair;Minimal assistance;Sitting   Upper Body Bathing: Moderate assistance;Sitting   Lower Body Bathing: Maximal assistance;Sit to/from stand   Upper Body Dressing : Moderate assistance;Sitting   Lower Body Dressing: Maximal assistance;Sit to/from stand   Toilet Transfer: Moderate assistance;Stand-pivot;BSC;RW Toilet Transfer Details (indicate cue type and reason): assist to boost into standing  Toileting- Clothing Manipulation and Hygiene: Moderate assistance;Sit to/from stand       Functional mobility during ADLs: Moderate assistance;Rolling walker       Vision         Perception     Praxis      Pertinent Vitals/Pain Pain Assessment: Faces Faces Pain Scale: Hurts even more Pain Location: abdomen with mobility Pain Descriptors / Indicators: Operative site guarding Pain Intervention(s): Monitored during session;Repositioned;Limited activity within patient's tolerance;Patient requesting pain meds-RN notified     Hand Dominance Right   Extremity/Trunk Assessment Upper Extremity Assessment Upper Extremity Assessment: Overall WFL for tasks assessed   Lower Extremity Assessment Lower Extremity Assessment: Defer to PT evaluation  Communication Communication Communication: No difficulties   Cognition Arousal/Alertness:  Awake/alert Behavior During Therapy: Anxious Overall Cognitive Status: Within Functional Limits for tasks assessed                                 General Comments: WFL for basic tasks    General Comments  HR 89; 02 sats 93% on 6L supplemental 02    Exercises General Exercises - Lower Extremity Ankle Circles/Pumps: AROM;10 reps;Supine;Both Heel Slides: AROM;5 reps;Supine;Both   Shoulder Instructions      Home Living Family/patient expects to be discharged to:: Private residence Living Arrangements: Spouse/significant other;Children Available Help at Discharge: Family Type of Home: House Home Access: Stairs to enter Technical brewer of Steps: couple   Home Layout: One level     Bathroom Shower/Tub: Occupational psychologist: Standard     Home Equipment: None   Additional Comments: Pt reports spouse works from home       Prior Functioning/Environment Level of Independence: Independent        Comments: Pt reports she likes to clean and "do all kinds of stuff".  She does not work         OT Problem List: Decreased strength;Decreased activity tolerance;Impaired balance (sitting and/or standing);Decreased knowledge of use of DME or AE;Cardiopulmonary status limiting activity;Pain;Decreased knowledge of precautions      OT Treatment/Interventions: Self-care/ADL training;Energy conservation;DME and/or AE instruction;Therapeutic activities;Patient/family education;Balance training;Therapeutic exercise    OT Goals(Current goals can be found in the care plan section) Acute Rehab OT Goals Patient Stated Goal: to have less pain and less swelling in legs  OT Goal Formulation: With patient Time For Goal Achievement: 11/12/19 Potential to Achieve Goals: Good ADL Goals Pt Will Perform Grooming: with min guard assist;standing Pt Will Perform Upper Body Bathing: with set-up;with supervision;sitting Pt Will Perform Lower Body Bathing: with min guard  assist;sit to/from stand Pt Will Perform Upper Body Dressing: with set-up;sitting Pt Will Perform Lower Body Dressing: with min guard assist;sit to/from stand Pt Will Transfer to Toilet: with min guard assist;ambulating;regular height toilet;bedside commode;grab bars Pt Will Perform Toileting - Clothing Manipulation and hygiene: with min guard assist  OT Frequency: Min 2X/week   Barriers to D/C:            Co-evaluation              AM-PAC OT "6 Clicks" Daily Activity     Outcome Measure Help from another person eating meals?: A Little Help from another person taking care of personal grooming?: A Little Help from another person toileting, which includes using toliet, bedpan, or urinal?: A Lot Help from another person bathing (including washing, rinsing, drying)?: A Lot Help from another person to put on and taking off regular upper body clothing?: A Lot Help from another person to put on and taking off regular lower body clothing?: A Lot 6 Click Score: 14   End of Session Equipment Utilized During Treatment: Rolling walker;Oxygen Nurse Communication: Mobility status;Patient requests pain meds  Activity Tolerance: Patient limited by pain;Patient limited by fatigue Patient left: in bed;with call bell/phone within reach;with bed alarm set  OT Visit Diagnosis: Unsteadiness on feet (R26.81);Pain Pain - part of body:  (abdomen )                Time: 2831-5176 OT Time Calculation (min): 16 min Charges:  OT General Charges $OT Visit: 1 Visit OT Evaluation $OT Eval  Moderate Complexity: 1 Mod  Nilsa Nutting., OTR/L Acute Rehabilitation Services Pager [phone] Office [phone]   Lucille Passy M 10/29/2019, 4:18 PM

## 2019-10-29 NOTE — Progress Notes (Signed)
Nutrition Follow-up  DOCUMENTATION CODES:   Non-severe (moderate) malnutrition in context of chronic illness  INTERVENTION:   Encourage PO intake on soft diet  Boost Breeze po TID, each supplement provides 250 kcal and 9 grams of protein.   Ensure Enlive po BID, each supplement provides 350 kcal and 20 grams of protein  TPN continues; wean once pt eating/taking oral nutrition supplements to meet >60% of needs  NUTRITION DIAGNOSIS:   Moderate Malnutrition related to chronic illness (crohn's disease) as evidenced by moderate muscle depletion, moderate fat depletion; ongoing  GOAL:   Patient will meet greater than or equal to 90% of their needs; progressing  MONITOR:   PO intake, Supplement acceptance, Skin, Weight trends, Labs, I & O's  REASON FOR ASSESSMENT:   Malnutrition Screening Tool, Consult Assessment of nutrition requirement/status  ASSESSMENT:   Natasha Barrera is a 54 year old female with past medical history of HTN, diverticulitis s/p sigmoid colectomy in 2013, ventral hernia (repaired), and prior episode of colitis, and a recent hospitalization for diarrhea, hypotension and electrolyte derangements secondary to colitis who returned nine days after leaving AMA found to have continued electrolyte derangements with new onset peripheral edema.   Pt discussed during ICU rounds and with RN.  Pt likes to drink Boost Breeze (wild berry) and ensure. Pt complains of early satiety and continues to require high O2 via HFNC. Noted new skin breakdown.   Pt with Inflammatory Colitis secondary to Crohn's Disease  7/24 s/p ex lap, LOA, segmental colectomy with end colostomy due to large perforation in the prior rectosigmoid anastomosis 7/25 TPN started due to anticipated prolonged ileus post op  Medications reviewed and include: SSI, solumedrol Labs reviewed JP: 305 ml   TPN @ 75 ml/hr provides 1849 kcal and 90 grams protein  Diet Order:   Diet Order             DIET SOFT Room service appropriate? Yes; Fluid consistency: Thin  Diet effective now                 EDUCATION NEEDS:   No education needs have been identified at this time  Skin:  Skin Assessment: Skin Integrity Issues: Skin Integrity Issues:: Stage I Stage I: sacrum  Last BM:  350 ml via colostomy  Height:   Ht Readings from Last 1 Encounters:  10/24/19 5' 1"  (1.549 m)    Weight:   Wt Readings from Last 1 Encounters:  10/24/19 77 kg    Ideal Body Weight:  54.5 kg  BMI:  Body mass index is 32.07 kg/m.  Estimated Nutritional Needs:   Kcal:  1700-1900  Protein:  90-105 grams  Fluid:  > 1.7 L  Rilan Eiland P., RD, LDN, CNSC See AMiON for contact information

## 2019-10-29 NOTE — Progress Notes (Signed)
Daily Rounding Note  10/29/2019, 1:14 PM  LOS: 10 days   SUBJECTIVE:   Chief complaint: Crohns colitis.  Colostomy after colon perforation      Ate 1/2 bagel this AM.  Starting in on spaghetti w marinara and parm now.  No nausea today, some mild yesterday.  Appetite not great.   Gets SOB w exertion making PT challenging.    OBJECTIVE:         Vital signs in last 24 hours:    Temp:  [97.6 F (36.4 C)-98.6 F (37 C)] 98.6 F (37 C) (07/29 1200) Pulse Rate:  [64-106] 93 (07/29 1200) Resp:  [10-23] 21 (07/29 1200) BP: (80-105)/(48-68) 85/55 (07/29 1200) SpO2:  [93 %-100 %] 97 % (07/29 1200) Last BM Date: 10/29/19 Filed Weights   10/18/19 1620 10/24/19 0452  Weight: 53.5 kg 77 kg   General: pale, looks ill  Heart: RRR Chest: wet cough.  Diminished BS bil.  Slight dyspnea when speaking.  Abdomen: minor tenderness.  Hypoactive BS.  ND.  Liquid, Bloody ostomy output  Extremities: LE edema Neuro/Psych:  Oriented x 3.  Pleasant.  Calm.  Fluid speech.  Intake/Output from previous day: 07/28 0701 - 07/29 0700 In: 3480.7 [P.O.:240; I.V.:2287.6; IV Piggyback:953] Out: 6629 [Urine:4550; Drains:305; Stool:350]  Intake/Output this shift: Total I/O In: 586.8 [P.O.:120; I.V.:427.8; IV Piggyback:39] Out: 2350 [Urine:2100; Drains:250]  Lab Results: Recent Labs    10/27/19 0242 10/28/19 0511 10/29/19 0504  WBC 19.4* 13.7* 13.5*  HGB 8.0* 7.5* 7.2*  HCT 25.6* 23.7* 22.3*  PLT 243 208 226   BMET Recent Labs    10/28/19 0511 10/28/19 1601 10/29/19 0504  NA 136 134* 134*  K 3.5 3.6 3.9  CL 100 96* 98  CO2 29 30 31   GLUCOSE 123* 151* 131*  BUN 5* 6 7  CREATININE 0.35* 0.36* 0.34*  CALCIUM 7.1* 7.1* 7.2*   LFT Recent Labs    10/29/19 0504  PROT 3.9*  ALBUMIN 1.4*  AST 27  ALT 37  ALKPHOS 96  BILITOT 0.4   PT/INR No results for input(s): LABPROT, INR in the last 72 hours. Hepatitis Panel No results for  input(s): HEPBSAG, HCVAB, HEPAIGM, HEPBIGM in the last 72 hours.  Studies/Results: CT Angio Abd/Pel w/ and/or w/o  Result Date: 10/29/2019 CLINICAL DATA:  54 year old history of pneumoperitoneum and a large perforation in the rectosigmoid region. Patient underwent exploratory laparotomy with lysis of adhesion and segmental colectomy with end colostomy. Patient was also noted to have new thrombus in the abdominal aorta on the CT from 10/23/2019. CTA examination performed in order to follow-up the aortic thrombus. EXAM: CT ANGIOGRAPHY ABDOMEN AND PELVIS WITH CONTRAST AND WITHOUT CONTRAST TECHNIQUE: Multidetector CT imaging of the abdomen and pelvis was performed using the standard protocol during bolus administration of intravenous contrast. Multiplanar reconstructed images and MIPs were obtained and reviewed to evaluate the vascular anatomy. CONTRAST:  169m OMNIPAQUE IOHEXOL 350 MG/ML SOLN COMPARISON:  CT abdomen pelvis 10/23/2019 FINDINGS: VASCULAR Aorta: Normal caliber of the distal descending thoracic aorta. Atherosclerotic calcifications involving the juxtarenal and infrarenal abdominal aorta. Negative for an aortic dissection or aneurysm. Again noted is thrombus along the right side of the infrarenal abdominal aortic wall. This is elongated thrombus and measures roughly 1.3 cm in length. The overall length of the thrombus has not significantly changed but the width has decreased measuring roughly 0.3 cm opposed to 0.5 cm. This thrombus is new since 10/02/2019. No significant stenosis  in the abdominal aorta. Aortic bifurcation is widely patent. Celiac: Patent without evidence of aneurysm, dissection, vasculitis or significant stenosis. SMA: Patent without evidence of aneurysm, dissection, vasculitis or significant stenosis. Renals: Single left renal artery is widely patent without aneurysm, dissection or significant stenosis. There are 2 right-sided renal arteries. Right renal arteries are patent without  aneurysm, dissection or significant stenosis. IMA: Patent without evidence of aneurysm, dissection, vasculitis or significant stenosis. Inflow: Patent without evidence of aneurysm, dissection, vasculitis or significant stenosis. Proximal Outflow: Proximal femoral arteries are patent bilaterally. Veins: Hepatic veins are patent. Portal venous system is patent. IVC and renal veins are patent. No gross abnormality to the iliac veins. Review of the MIP images confirms the above findings. NON-VASCULAR Lower chest: Again noted are bilateral pleural effusions. Right effusion has enlarged in size. There may be slight enlargement of the left pleural effusion. Increased compressive atelectasis in both lower lobes. Hepatobiliary: Trace perihepatic ascites. Normal appearance of the gallbladder. No focal liver lesion. No significant biliary dilatation. Pancreas: Unremarkable. No pancreatic ductal dilatation or surrounding inflammatory changes. Spleen: Normal in size without focal abnormality. Adrenals/Urinary Tract: Normal appearance of the adrenal glands. New low-density peripheral wedge-shaped area along the right kidney lower pole. This was not present on the previous examinations and suspect this is related to small infarct based on the aortic thrombus. This area measures up to 1.6 cm. Normal enhancement the left kidney. Normal appearance of the urinary bladder. Stomach/Bowel: Patient now has a Hartmann's pouch which contains high-density contrast. 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 Again noted is a dilated transverse colon with some wall thickening involving the hepatic flexure and mild wall thickening in the ascending colon. Cecum appears to be fluid-filled and poorly defined. No acute abnormality to the stomach or small bowel. Lymphatic: No significant lymph node enlargement in the abdomen or pelvis. Reproductive: Uterus and bilateral adnexa are unremarkable. Other: Right abdominal surgical drain that extends  into the right pelvic region. Poorly defined fluid and gas along the anterior abdominal cavity near the midline incision. There is poorly defined fluid along the anterior abdominal cavity measures roughly 4.5 x 0.9 x 2.4 cm. Fluid-filled structure in the right hemipelvic region appears to be related to the cecum rather than extraluminal fluid collection. Small amount of fluid along the lateral left abdomen sequence 13, image 37. Trace perisplenic fluid. Diffuse subcutaneous edema. Question a small fluid collection between loops of bowel in the mid abdomen on sequence 13, image 51 that measures roughly 1.7 cm. Poorly developed fluid along the posterior aspect of the ascending colon on sequence 13, image 48 measuring up to 1.5 cm. Musculoskeletal: No acute bone abnormality. IMPRESSION: VASCULAR 1. Thrombus along the right side of the infrarenal abdominal aorta has decreased in size compared to the exam on 10/23/2019. Suspect this is related to thromboembolic disease since it was not present on the exam from 10/02/2019. No new arterial thrombus in the abdomen or pelvis. 2.  Aortic Atherosclerosis (ICD10-I70.0). NON-VASCULAR 1. New peripheral wedge-shaped low-density area in the right kidney lower pole. Based on the arterial thrombus, suspect this is related to a small renal infarct. Small focus of pyelonephritis is also in the differential but thought to be less likely. 2. Extensive postoperative changes compatible with partial colectomy and creation of left abdominal colostomy. 3. Scattered small fluid collections in the abdomen. These small fluid collections are poorly defined and could represent postoperative change. Consider surveillance to exclude developing abscess formations. 4. Bilateral pleural effusions with extensive  compressive atelectasis at the lung bases. Right pleural effusion has enlarged compared to the exam on 10/23/2019. 5. Persistent wall thickening involving the right colon. Findings are  suggestive for bowel inflammation. Electronically Signed   By: Markus Daft M.D.   On: 10/29/2019 08:58   Scheduled Meds: . Chlorhexidine Gluconate Cloth  6 each Topical Daily  . feeding supplement  1 Container Oral TID BM  . feeding supplement (ENSURE ENLIVE)  237 mL Oral BID BM  . insulin aspart  0-20 Units Subcutaneous Q4H  . methocarbamol  1,000 mg Oral TID  . methylPREDNISolone (SOLU-MEDROL) injection  40 mg Intravenous Q12H  . sodium chloride flush  10-40 mL Intracatheter Q12H  . sodium chloride flush  3 mL Intravenous Once   Continuous Infusions: . sodium chloride Stopped (10/29/19 0541)  . heparin 1,050 Units/hr (10/29/19 1200)  . TPN ADULT (ION) 75 mL/hr at 10/29/19 1200  . TPN ADULT (ION)     PRN Meds:.sodium chloride, HYDROmorphone (DILAUDID) injection, ondansetron, oxyCODONE, sodium chloride flush   ASSESMENT:   *  Crohns colitis.  Initially present during 7/2 admission.  On going Solumedrol.   10/29/19 CTAP w angio: infrarenal thrombus decreased, ? Small rena infarct.  Scattered, small abdominal fluid collections.  Persistent R colon wall thickening/inflammation.  Quant gold test indeterminate.  TPMT testing in progress.      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  Sigmoid colectomy for diverticulitis 2013.    *   Infrarenal aortic thrombus.  IV Heparin in place.      *   R >> L pleural effusions.    *    Protein cal malnutrition, moderate. Pre albumin is <5.  On TNA and soft diet, ensure and boost.    *   Macrocytic anemia.  Suspect multi-factorial, some is blood loss from GI tract as having blood in ostomy output, no bleeding before surgery.    *   Hyponatremia.       PLAN   *   Note that surgery sees no indication for contd abx, from GI view these are not necessary.    *   Though Hgb not yet at 7, would consider giving PRBC as it may help w her dyspnea and benefit her progress.     *  Continue the Solumedrol.     Azucena Freed   10/29/2019, 1:14 PM Phone [phone]

## 2019-10-30 ENCOUNTER — Inpatient Hospital Stay (HOSPITAL_COMMUNITY): Payer: 59

## 2019-10-30 DIAGNOSIS — I7419 Embolism and thrombosis of other parts of aorta: Secondary | ICD-10-CM | POA: Diagnosis not present

## 2019-10-30 LAB — BASIC METABOLIC PANEL
Anion gap: 7 (ref 5–15)
Anion gap: 8 (ref 5–15)
BUN: 9 mg/dL (ref 6–20)
BUN: 15 mg/dL (ref 6–20)
CO2: 29 mmol/L (ref 22–32)
CO2: 25 mmol/L (ref 22–32)
Calcium: 7.8 mg/dL — ABNORMAL LOW (ref 8.9–10.3)
Calcium: 7.6 mg/dL — ABNORMAL LOW (ref 8.9–10.3)
Chloride: 99 mmol/L (ref 98–111)
Chloride: 97 mmol/L — ABNORMAL LOW (ref 98–111)
Creatinine, Ser: <0.3 mg/dL — ABNORMAL LOW (ref 0.44–1.00)
Creatinine, Ser: 0.32 mg/dL — ABNORMAL LOW (ref 0.44–1.00)
GFR calc Af Amer: >60 mL/min (ref >60)
GFR calc non Af Amer: >60 mL/min (ref >60)
Glucose, Bld: 113 mg/dL — ABNORMAL HIGH (ref 70–99)
Glucose, Bld: 155 mg/dL — ABNORMAL HIGH (ref 70–99)
Potassium: 3.8 mmol/L (ref 3.5–5.1)
Potassium: 4.2 mmol/L (ref 3.5–5.1)
Sodium: 135 mmol/L (ref 135–145)
Sodium: 130 mmol/L — ABNORMAL LOW (ref 135–145)

## 2019-10-30 LAB — CBC
HCT: 20.7 % — ABNORMAL LOW (ref 36.0–46.0)
HCT: 20 % — ABNORMAL LOW (ref 36.0–46.0)
Hemoglobin: 6.8 g/dL — CL (ref 12.0–15.0)
Hemoglobin: 6.4 g/dL — CL (ref 12.0–15.0)
MCH: 33.5 pg (ref 26.0–34.0)
MCH: 32.5 pg (ref 26.0–34.0)
MCHC: 32.9 g/dL (ref 30.0–36.0)
MCHC: 32 g/dL (ref 30.0–36.0)
MCV: 102 fL — ABNORMAL HIGH (ref 80.0–100.0)
MCV: 101.5 fL — ABNORMAL HIGH (ref 80.0–100.0)
Platelets: 255 10*3/uL (ref 150–400)
Platelets: 259 10*3/uL (ref 150–400)
RBC: 2.03 MIL/uL — ABNORMAL LOW (ref 3.87–5.11)
RBC: 1.97 MIL/uL — ABNORMAL LOW (ref 3.87–5.11)
RDW: 21.2 % — ABNORMAL HIGH (ref 11.5–15.5)
RDW: 21.6 % — ABNORMAL HIGH (ref 11.5–15.5)
WBC: 10.6 10*3/uL — ABNORMAL HIGH (ref 4.0–10.5)
WBC: 10.2 10*3/uL (ref 4.0–10.5)
nRBC: 0.2 % (ref 0.0–0.2)
nRBC: 0.2 % (ref 0.0–0.2)

## 2019-10-30 LAB — PREPARE RBC (CROSSMATCH)

## 2019-10-30 LAB — LACTIC ACID, PLASMA
Lactic Acid, Venous: 2.6 mmol/L (ref 0.5–1.9)
Lactic Acid, Venous: 3.8 mmol/L (ref 0.5–1.9)

## 2019-10-30 LAB — GLUCOSE, CAPILLARY
Glucose-Capillary: 125 mg/dL — ABNORMAL HIGH (ref 70–99)
Glucose-Capillary: 105 mg/dL — ABNORMAL HIGH (ref 70–99)
Glucose-Capillary: 112 mg/dL — ABNORMAL HIGH (ref 70–99)
Glucose-Capillary: 139 mg/dL — ABNORMAL HIGH (ref 70–99)
Glucose-Capillary: 140 mg/dL — ABNORMAL HIGH (ref 70–99)
Glucose-Capillary: 148 mg/dL — ABNORMAL HIGH (ref 70–99)

## 2019-10-30 LAB — HEPARIN LEVEL (UNFRACTIONATED): Heparin Unfractionated: 0.48 IU/mL (ref 0.30–0.70)

## 2019-10-30 LAB — HEMOGLOBIN AND HEMATOCRIT, BLOOD
HCT: 25.2 % — ABNORMAL LOW (ref 36.0–46.0)
Hemoglobin: 8.3 g/dL — ABNORMAL LOW (ref 12.0–15.0)

## 2019-10-30 LAB — MAGNESIUM: Magnesium: 2.3 mg/dL (ref 1.7–2.4)

## 2019-10-30 MED ORDER — ALUM & MAG HYDROXIDE-SIMETH 200-200-20 MG/5ML PO SUSP
15.0000 mL | Freq: Once | ORAL | Status: AC
  Administered 2019-10-30: 15 mL via ORAL
  Filled 2019-10-30: qty 30

## 2019-10-30 MED ORDER — TRAMADOL HCL 50 MG PO TABS
50.0000 mg | ORAL_TABLET | Freq: Four times a day (QID) | ORAL | Status: DC | PRN
  Administered 2019-10-30: 50 mg via ORAL
  Filled 2019-10-30: qty 1

## 2019-10-30 MED ORDER — SODIUM CHLORIDE 0.9 % IV BOLUS
1000.0000 mL | Freq: Once | INTRAVENOUS | Status: AC
  Administered 2019-10-30: 1000 mL via INTRAVENOUS

## 2019-10-30 MED ORDER — SODIUM CHLORIDE 0.9% IV SOLUTION: Freq: Once | INTRAVENOUS | Status: AC

## 2019-10-30 MED ORDER — TRAVASOL 10 % IV SOLN
INTRAVENOUS | Status: AC
  Filled 2019-10-30: qty 900

## 2019-10-30 MED ORDER — HYDROCODONE-ACETAMINOPHEN 5-325 MG PO TABS
1.0000 | ORAL_TABLET | ORAL | Status: DC | PRN
  Administered 2019-10-30 (×2): 2 via ORAL
  Filled 2019-10-30 (×2): qty 2

## 2019-10-30 MED ORDER — HYDROMORPHONE HCL 1 MG/ML IJ SOLN
0.2500 mg | INTRAMUSCULAR | Status: DC | PRN
  Administered 2019-10-30 – 2019-11-02 (×8): 0.25 mg via INTRAVENOUS
  Filled 2019-10-30 (×9): qty 1

## 2019-10-30 MED ORDER — LACTATED RINGERS IV BOLUS
1000.0000 mL | Freq: Once | INTRAVENOUS | Status: AC
  Administered 2019-10-31: 1000 mL via INTRAVENOUS

## 2019-10-30 MED ORDER — IOHEXOL 300 MG/ML  SOLN
100.0000 mL | Freq: Once | INTRAMUSCULAR | Status: AC | PRN
  Administered 2019-10-30: 100 mL via INTRAVENOUS

## 2019-10-30 MED ORDER — VITAMIN A 15 MG/ML IM SOLN: 100000.0000 [IU] | Freq: Once | INTRAMUSCULAR | Status: DC

## 2019-10-30 NOTE — Progress Notes (Signed)
Pt refused abdominal wound care on night shift. Pt stated that her husband will be learning to change ostomy and abdominal wound in the AM and wanted to wait until then.

## 2019-10-30 NOTE — Consult Note (Signed)
[facility] Nurse ostomy follow up  Met with patient and husband at the bedside, she is having abdominal pain but is receptive to my teaching session. Focused however on her husband who is making video and participating in her care.   Used stoma model for teaching today because nurse has just changed the pouch.  Stoma type/location: LLQ, end colostomy Stomal assessment/size: appears to be oval shaped 1 x 1 1/2 " or slightly larger, flush with skin however, pink and moist Peristomal assessment: NA Treatment options for stomal/peristomal skin: Unclear if nurse used barrier ring, updated orders to request this be used with each pouch change  Output bloody Ostomy pouching: 2pc. 2 3/4"  Education provided: USING STOMA MODEL Explained role of ostomy nurse and creation of stoma  Explained stoma characteristics (budded, flush, color, texture, care) Demonstrated pouch change (cutting new skin barrier, measuring stoma, cleaning peristomal skin and stoma, use of barrier ring) Education on emptying when 1/3 to 1/2 full and how to empty Demonstrated "burping" flatus from pouch Demonstrated use of wick to clean spout  Discussed bathing Patient's husband participated in all aspects of demonstrations today (cut new skin barrier, placed barrier ring, open and closing lock and roll, attaching pouch to skin barrier using stoma model  Answered patient/family questions:   Concerned about supplies at [place], discussed  [facility] and supplies with patient and husband.     Enrolled patient in [place] Start Discharge program: Yes  [place] Nurse will follow along with you for continued support with ostomy teaching and care [place] MSN, [place], [place], [place], [place]

## 2019-10-30 NOTE — Progress Notes (Signed)
ANTICOAGULATION CONSULT NOTE - Follow Up Consult  Pharmacy Consult for heparin Indication: mural thrombus  No Known Allergies  Patient Measurements: Height: 5' 1"  (154.9 cm) Weight: 77 kg (169 lb 12.1 oz) IBW/kg (Calculated) : 47.8 Heparin Dosing Weight: 64.9kg  Vital Signs: Temp: 98.2 F (36.8 C) (07/30 0404) Temp Source: Oral (07/30 0404) BP: 100/57 (07/30 0404) Pulse Rate: 73 (07/30 0404)  Labs: Recent Labs    10/28/19 0511 10/28/19 0511 10/28/19 1601 10/28/19 1743 10/29/19 0504 10/29/19 0504 10/29/19 1423 10/29/19 2123 10/30/19 0355  HGB 7.5*   < >  --   --  7.2*  --   --   --  6.8*  HCT 23.7*  --   --   --  22.3*  --   --   --  20.7*  PLT 208  --   --   --  226  --   --   --  255  HEPARINUNFRC 0.76*  --   --    < > 0.96*   < > 0.83* 0.53 0.48  CREATININE 0.35*   < > 0.36*  --  0.34*  --   --   --  <0.30*   < > = values in this interval not displayed.    CrCl cannot be calculated (This lab value cannot be used to calculate CrCl because it is not a number: <0.30).  Assessment: 45 yof found to have a mural thrombus on IV heparin for anticoagulation.   Heparin level 0.48 is therapeutic on heparin 1000 units/hr. Level drawn appropriately. Hgb 6.8. Plt 255. Per RN, the output in ostomy is slightly reddish and no other signs of bleeding. Per RN, MD made aware of hgb and ostomy output.    Goal of Therapy:  Heparin level 0.3-0.7 units/ml Monitor platelets by anticoagulation protocol: Yes   Plan:  Continue heparin at 1000 units/hr  Monitor heparin level, CBC and S/S of bleeding daily   Cristela Felt, PharmD Clinical Pharmacist

## 2019-10-30 NOTE — Progress Notes (Deleted)
PHARMACY - TOTAL PARENTERAL NUTRITION CONSULT NOTE   Indication: Prolonged ileus  Patient Measurements: Height: 5' 1"  (154.9 cm) Weight: 77 kg (169 lb 12.1 oz) IBW/kg (Calculated) : 47.8 TPN AdjBW (KG): 55.1 Body mass index is 32.07 kg/m.  Assessment:  76 yof with remote history of sigmoid colectomy and recent admission d/t colitis initially presented with lower extremity edema and later developed abdominal pain with decreased appetite. S/p EGD + colonoscopy on 7/21 that showed Crohn's colitis. On 7/23 a CT showed large amount pneumoperitoneum and patients underwent exlap with partial colectomy and colostomy. To start TPN for anticipated prolonged ileus.   Glucose / Insulin: CBGs 129-189, Received 20 units of rSSI in 24 hours, on solumedrol  Electrolytes: Phos 3, Mg 2.2, K normalized with aggressive repletion. Other lytes wnl  Renal: SCr 0.34 LFTs / TGs: LFTs WNL Prealbumin / albumin: prealbumin <5, albumin 1.4 Intake / Output; MIVF: 331m drain output, 350 mL colostomy output, UOP 2.593mkg/hr GI Imaging: 7/23 Abd xray - ileus vs SBO 7/23 CT abd - Extensive pneumoperitoneum, most consistent with colonic perforation given the persistent diffuse colitis Surgeries / Procedures:  7/21 Colonoscopy/EGD 7/24 Exlap, partial colectomy and colostomy  Central access: PICC placed 7/25 TPN start date: 7/25  Nutritional Goals (per RD recommendation on 7/22): kCal: 1700-1900, Protein: 90-105g, Fluid: >1.7L  Goal TPN rate is 75 mL/hr (provides 90 g of protein and 1849 kcals per day)  Current Nutrition:  Clear liquids and Oral supplements  TPN  Plan:  Continue TPN at goal rate of 7518mr at 1800 - provides 90 g of protein and 1849 kcals per day (100% goal calories)  Electrolytes in TPN:  Na 36m57m of Na, K 40mE23m 5mEq/30mf Ca, 5mEq/L20m Mg, and 15mmol/76m Phos. Cl:Ac ratio 1:1 Add standard MVI and trace elements to TPN Continue Resistant q4h SSI and adjust as needed  Monitor TPN labs  on Mon/Thurs, repeat electrolytes tomorrow  Natasha Barrera Felt Clinical Pharmacist

## 2019-10-30 NOTE — Progress Notes (Signed)
[facility]  Subjective  -no complaints this morning   Objective (!) 111/63 80 98.4 F (36.9 C) (Oral) 18 100%  Intake/Output Summary (Last 24 hours) at 10/30/2019 0813 Last data filed at 10/30/2019 0752 Gross per 24 hour  Intake 1994.28 ml  Output 3060 ml  Net -1065.72 ml    Palpable DP pulses bilateral lower extremities, edema improving  Laboratory Lab Results: Recent Labs    10/29/19 0504 10/30/19 0355  WBC 13.5* 10.6*  HGB 7.2* 6.8*  HCT 22.3* 20.7*  PLT 226 255   BMET Recent Labs    10/29/19 0504 10/30/19 0355  NA 134* 135  K 3.9 3.8  CL 98 99  CO2 31 29  GLUCOSE 131* 113*  BUN 7 9  CREATININE 0.34* <0.30*  CALCIUM 7.2* 7.8*    COAG Lab Results  Component Value Date   INR 1.13 10/25/2011   No results found for: PTT  Assessment/Planning:  54 year old female admitted with new diagnosis Crohn's disease with perforation requiring laparotomy and colectomy.  Vascular consulted for new infrarenal thrombus noted on CT over the weekend.    This was a new finding in her infrarenal aorta and looked relatively acute.  She has had no embolic events and she has palpable pedal pulses still.  She tolerated heparin without any issues, Hgb today drifting down slowly 7.5 --> 7.2 --> 6.8 last 3 days.  Repeat CTA yesterday looks stable if not slightly improved.  Discussed with her this morning would recommend transition to [place] when closer to discharge and follow-up with me in 4 to 6 weeks with CTA abdomen pelvis that I will arrange.  I am out of town next week so please call vascular surgery if there are any questions or concerns.  We will sign off at this time.  Marty Heck 10/30/2019 8:13 AM --

## 2019-10-30 NOTE — Progress Notes (Signed)
OT Cancellation Note  Patient Details Name: Natasha Barrera MRN: [id number] DOB: 1965-09-25   Cancelled Treatment:    Reason Eval/Treat Not Completed: Patient at procedure or test/ unavailable (woc education occurring)  OT to reattempt at a later time so family education could occur.  Fleeta Emmer, OTR/L  Acute Rehabilitation Services Pager: [phone] Office: [phone] .  Jeri Modena 10/30/2019, 11:22 AM

## 2019-10-30 NOTE — Progress Notes (Signed)
Pt husband continuing to express concern for pt's severe pain today, despite multiple interventions. Pt received Tramadol, which she said did not do anything for her. Requested to change to Vicodin. MD made aware and was agreeable to switch and placed orders. Pt also received 0.45m Dilaudid x2, 2 hrs apart as ordered as well with little relief. MD made aware. Pt's husband also concerned that pt is "more gassy and burping" today and request Mylanta and Imodium. Educated that Imodium is used for diarrhea, but did receive one-time order for Mylanta. MD agrees to come assess pt if intervention does not work.  CJustice Rocher RN

## 2019-10-30 NOTE — Progress Notes (Signed)
PHARMACY - TOTAL PARENTERAL NUTRITION CONSULT NOTE   Indication: Prolonged ileus  Patient Measurements: Height: 5' 1"  (154.9 cm) Weight: 77 kg (169 lb 12.1 oz) IBW/kg (Calculated) : 47.8 TPN AdjBW (KG): 55.1 Body mass index is 32.07 kg/m.  Assessment:  18 yof with remote history of sigmoid colectomy and recent admission d/t colitis initially presented with lower extremity edema and later developed abdominal pain with decreased appetite. S/p EGD + colonoscopy on 7/21 that showed Crohn's colitis. On 7/23 a CT showed large amount pneumoperitoneum and patients underwent exlap with partial colectomy and colostomy. To start TPN for anticipated prolonged ileus.   Glucose / Insulin: CBGs controlled. Received 9 units of rSSI in 24 hours. Continues on solumedrol 52m q12h Electrolytes: Na 135. K 3.8 (s/p furosemide x1 on 7/29). Other electrolytes wnl. Phos and Mg wnl on 7/29.  Renal: SCr stable. UOP 1.1 ml/kg/hr with 1 undocumented occurrence.  LFTs / TGs: LFTs WNL. T bili wnl. TG 271 on 7/26.  Prealbumin / albumin: prealbumin <5, albumin 1.4 Intake / Output; MIVF: 350 mL / 24 hrs drain output, 500 mL / 24 hrs colostomy output GI Imaging: 7/23 Abd xray - ileus vs SBO 7/23 CT abd - Extensive pneumoperitoneum, most consistent with colonic perforation given the persistent diffuse colitis 7/29 CT abd - scattered small fluid collections, bowel inflammation Surgeries / Procedures:  7/21 Colonoscopy/EGD 7/24 Exlap, partial colectomy and colostomy  Central access: PICC placed 7/25 TPN start date: 7/25  Nutritional Goals (per RD recommendation on 7/22): kCal: 1700-1900, Protein: 90-105g, Fluid: >1.7L  Goal TPN rate is 75 mL/hr (provides 90 g of protein and 1849 kcals per day)  Current Nutrition:  Soft diet - pt reported on 7/30 not consuming any oral food  Ensure twice daily - one documented yesterday Boost three times daily - one documented yesterday TPN  Plan:  Continue TPN at goal rate  of 78mhr at 1800 - provides 90 g of protein and 1849 kcals per day (100% goal calories)  Electrolytes in TPN:  Na increased to 10025mL, K 20m46m, 5mEq48mof Ca, 5mEq/1mf Mg, and 15mmol47mf Phos. Cl:Ac ratio 1:1 Add standard MVI and trace elements to TPN Continue Resistant q4h SSI and adjust as needed  Monitor TPN labs on Mon/Thurs, repeat electrolytes tomorrow    Ishan Sanroman BCristela FeltD Clinical Pharmacist

## 2019-10-30 NOTE — Progress Notes (Signed)
CRITICAL VALUE ALERT  Critical Value:  6.8 Hemoglobin  Date & Time Notied:  10/30/2019 0543  Provider Notified: On call provider  Orders Received/Actions taken: 1 unit RBC

## 2019-10-30 NOTE — Progress Notes (Signed)
Messaged by Oren Beckmann, patient's tele showing multiple episodes of non-sustained vtach and 2 episodes of 20 second vtach around 1310. Confirmed on tele. On exam, patient reporting that she feels fine and wants to take a nap. Denies chest pain or SOB. Denies abdominal pain at this time as she received some pain meds earlier that have helped. CV exam showing normal rate and regular rhythm. No m/r/g. No tachycardia noted. Informed patient to tell nurse if she feels chest pain or SOB.  Ordered STAT EKG, showing normal sinus rhythm with slightly prolonged QT (QTc 462m). Avoid QT prolonging meds.  Ordered STAT BMP, Mag, and lactic acid. Will f/u

## 2019-10-30 NOTE — Progress Notes (Addendum)
Subjective:   Natasha Barrera is a 54yo female w/PMH of HTN, diverticulitis with perforation s/p sigmoid colectomy 2013, recently diagnosed Crohn's Disease admitted with Crohn's flare. She underwent EGD and colonoscopy on 7/21, which showed grossly abnormal colon. Pathology showed severe active chronic colitis of the left and right colon.  On 7/23 she began to have abdominal pain. CT abdomen showed pneumoperitoneum, and she was taken urgently to surgery for perforation, which was found to be near the site of previous anastomosis at the rectosigmoid. The colon was divided at that point and a colostomy placed with plans to attempt to create anastomosis of the bowel in six months.  After surgery she required transfer to the ICU for pressor support. She additionally had large bilateral effusions secondary to her malnutrition and was treated with IV lasix and HFNC.  She was transferred back to IMTS service today after discontinued pressor support two days ago.  Earlier CT also showed aortic infrarenal thrombus. Vascular was consulted and she is currently on heparin gtt for this.   Today, patient states she is having more pain than usual. She describes sharp sudden cramps that are 8/10 then slowly taper of. Her last BM was this morning. She did try to eat a little last night and had some pain afterward. She plans to only consumes liquids today and very little as she is in a lot of pain. She is also having some nausea.  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 She denies dizziness, chest pain, or shortness of breath. She does have a slight productive cough.   Objective:  Vital signs in last 24 hours: Vitals:   10/29/19 1540 10/29/19 1945 10/29/19 2355 10/30/19 0404  BP: 112/73 (!) 91/59 (!) 99/63 (!) 100/57  Pulse: 105 80 74 73  Resp: 20 16 15 17   Temp: (!) 97.5 F (36.4 C) 98.2 F (36.8 C) 97.9  F (36.6 C) 98.2 F (36.8 C)  TempSrc: Oral Oral Oral Oral  SpO2: 97% 94% 100% 100%  Weight:      Height:        Constitution: NAD, acutely ill appearing, appears stated age HENT: AC, AT Cardio: RRR, no m/r/g, trace LE edema  Respiratory: CTA, no w/r/r Abdominal: very TTP, midline abdominal surgical site with dressing in place, ostomy bag appears to be leaking in wound; ostomy site with dark red fluid; abdominal drain with serosanguinous fluid  MSK: moving all extremities Neuro: normal affect, a&ox3 Skin: as described above in abdominal, otherwise c/d/i    CT abdomen and pelvis 7/23   1.extensive pneumoperitoneum 2.  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  Small bilateral pleural effusions 4.  Diffuse hepatic steatosis  Echo 7/26 1. Left ventricular ejection fraction, by estimation, is 50 to 55%, low normal function,  no regional wall motion abnormalities.  2. Right ventricular systolic function is normal 3. Moderate pleural effusion in the left lateral region.  4. The mitral valve is grossly normal. Trivial mitral valve  regurgitation. No evidence of mitral stenosis.  5. The aortic valve was not well visualized. Aortic valve regurgitation  is not visualized. No aortic stenosis is present.  CT Angio Abdomen Pelvis  1. New peripheral wedge-shaped low-density area in the right kidney lower pole. Based on the arterial thrombus, suspect this is related to a small renal infarct. Small focus of pyelonephritis  is also in the differential but thought to be less likely. 2. Extensive postoperative changes compatible with partial colectomy and creation of left abdominal colostomy. 3. Scattered small fluid collections in the abdomen. These small fluid collections are poorly defined and could represent postoperative change. Consider surveillance to exclude developing abscess formations. 4. Bilateral pleural effusions with extensive  compressive atelectasis at the lung bases. Right pleural effusion has enlarged compared to the exam on 10/23/2019. 5. Persistent wall thickening involving the right colon. Findings are suggestive for bowel inflammation.   Electronically Signed   By: Markus Daft M.D.   On: 10/29/2019 08:58  Assessment/Plan:  Principal Problem:   Crohn's disease of large intestine with other complication (HCC) Active Problems:   Colitis   Hyponatremia   Malnutrition of moderate degree (HCC)   Hypotension due to hypovolemia   Anemia   Weight loss, unintentional   Esophageal ring   Hypoalbuminemia due to protein-calorie malnutrition (HCC)   Pressure injury of skin   Natasha Barrera is a 54yo female w/PMH of HTN, diverticulitis with perforation s/p sigmoid colectomy 2013, recently diagnosed Crohn's Disease admitted with Crohn's flare who underwent colectomy with temp colostomy placement secondary to sigmoid colon perforation.   Crohn's Disease s/p perforation and colostomy  Hypoalbuminemia Pain increased today with some dark blood in colostomy bag, unclear if this is new. Hgb has been downtrending s/p surgery with IV heparin for infrarenal aortic thrombus. Additionally ostomy back is draining into her abdominal surgical site. She continues to have output, 350cc out last 24 hours. Healing complicated by severe malnutrition, on TPN. Albumin yesterday 1.4. WBC count continuing to trend down, 10.6 this morning and zosyn stopped yesterday. CT yesterday morning did show some fluid collection secondary to post-operative changes vs. Early abscess formation.  - general surgery consulted, will try to touch base whether this is new output, appreciate recommendations  - zosyn 7/24> 7/29CT with post-op changes vs. Early abscess, continue to monitor clinical status, will restart abx if any acute changes or increase in WBC, would need to add vanc if so.  - TPN per surgery, on soft diet - off and on solu-medrol 40  mg q12h, restarted 7/27 - start biologics 30-60 post op per GI - also will need improvement in albumin prior to initiating.   - pain control - decrease dilaudid to .25 mg q2h as she states other dose is too strong, switch oxy to tramadol  - cont. PT/OT - electrolytes stable - cont. Heparin gtt for now, vascular has signed off. Transfuse 1U and will continue to monitor hemoglobin for now.   Acute hypoxic respiratory fialure 2/2 bilateral moderate pleural effusions Protein Losing Gastroenteropathy and calorie malnutrition  Likely secondary to hypoalbuminemia, received 12.5 g albumin x2 on . Treated with lasix IV 40 mg bid for two days then qd yesterday. She is overall net positive 6L, since admission, negative 2.2L overnight. Breathing well at 100% on HFNC. Receiving 75cc/hr with TPN for around 6 hours per day. CT yesterday showed increased in right pleural effusion.   - wean for O2 sats >92% - would like to assess how much she is actually requiring to maintain saturations as she is at 100% on HFNC.  - strict I/O's - hold lasix w/bp 111/63 with episodes of hypotension overnight, appears euvolemic currently  - with thoracentesis effusions would likely recur quickly although this may need to be done at some point.  - encouraged IS use  - cont. TPN   Infrarenal Aortic Thrombus  Small Renal Infarct Provoked in the setting of acute illness and IBD. Seen on CT 7/23, new compared to abdominal CT on 7/2. Vascular was consulted, and she is on heparin gtt. Underwent repeat CT abd yesterday which showed stable but improving infra-renal thrombus. Hemoglobin has been slowly down trending since surgery. Vascular signed off today and recommend outpatient follow-up for CT abd and switching to DOAC prior to discharge. Small renal infarct also seen on CT.   - cont. Heparin gtt per vascular, will need outpatient follow-up for repeat imaging  - transfuse 1U, post transfusion CBC  - switch to DOAC prior to  discharge   Anemia  Likely in the setting of therapeutic heparin, recent surgery, and anemia due to severe illness.   - transfuse 1U, post transfusion CBC   VTE: heparin gtt IVF: none Diet: soft diet, TPN Code: full   Dispo: Anticipated discharge pending medical improvement.   Orhan Mayorga A, DO 10/30/2019, 6:48 AM Pager: [phone] After 5pm on weekdays and 1pm on weekends: On Call Pager: [phone]

## 2019-10-30 NOTE — Progress Notes (Signed)
PT Cancellation Note  Patient Details Name: Natasha Barrera MRN: [id number] DOB: [birth year]   Cancelled Treatment:    Reason Eval/Treat Not Completed: Pain limiting ability to participate; patient writhing in pain in the bed, spouse reports asking for other medication.  Unable to participate in PT.  RN aware.  Will attempt again another day.   Reginia Naas 10/30/2019, 1:10 PM  Magda Kiel, PT [facility] Services WNOPW:[phone] [phone] 10/30/2019

## 2019-10-30 NOTE — Progress Notes (Addendum)
Upon review of pt's tele monitor, pt has shown non-sustained vtach and vtach today, particularly in the past 2 hours. Pt denies SOB and CP. MD made aware. See new orders.  Justice Rocher, RN

## 2019-10-30 NOTE — Progress Notes (Signed)
Pt with triple lumen PICC has previously been a nurse draw, but per charge RN, since pt is now receiving a heparin gtt through a lumen she should be a lab draw. Consulted MD for repeat CBC after hgb had shown 6.8 this am, requiring 1u PRBCs. STAT order placed for phlebotomy lab draw. Repeat hgb 6.4 Pt receiving 1u PRBCs.   Justice Rocher, RN

## 2019-10-30 NOTE — Progress Notes (Signed)
CRITICAL VALUE ALERT  Critical Value:  Lactic acid 2.6  Date & Time Notied:  10/30/2019, 1821  Provider Notified: Dr Allyson Sabal Teaching service   Orders Received/Actions taken: 1L NS boulus and CT of abd

## 2019-10-30 NOTE — Progress Notes (Signed)
6 Days Post-Op   Subjective/Chief Complaint: Reports increased abdominal pain/nausea in the last 24 hours, exacerbated by PO intake and movement. Denies chest pain, SOB, palpitations, light-headedness. No reported urinary sxs. Continues to have ostomy output  Afebrile, VSS WBC 10.6 Hgb/hct slowly trending down 6.8/20, receiving a unit of blood this morning.  Objective: Vital signs in last 24 hours: Temp:  [97.4 F (36.3 C)-98.6 F (37 C)] 98.4 F (36.9 C) (07/30 0746) Pulse Rate:  [73-105] 80 (07/30 0746) Resp:  [12-23] 18 (07/30 0746) BP: (80-112)/(52-73) 111/63 (07/30 0746) SpO2:  [93 %-100 %] 100 % (07/30 0746) Last BM Date: 10/29/19  Intake/Output from previous day: 07/29 0701 - 07/30 0700 In: 2092.9 [P.O.:120; I.V.:1933.9; IV Piggyback:39] Out: 3350 [Urine:2500; Drains:350; Stool:500] Intake/Output this shift: Total I/O In: -  Out: 110 [Drains:110]  General appearance: alert and cooperative Resp: normal effort on [place] Cardio: regular rate and rhythm GI: soft, abdominal dressing contaminated with stool, ostomy pink with stool (500cc/24h) in pouch. Drain output turbid serous fluid (350 cc/24h). Ostomy appliance and midline wound changed at the bedside with RN. No active bleeding. Fascia in tact. Extremities: anasarca, palpable pedal pulses   Lab Results:  Recent Labs    10/29/19 0504 10/30/19 0355  WBC 13.5* 10.6*  HGB 7.2* 6.8*  HCT 22.3* 20.7*  PLT 226 255   BMET Recent Labs    10/29/19 0504 10/30/19 0355  NA 134* 135  K 3.9 3.8  CL 98 99  CO2 31 29  GLUCOSE 131* 113*  BUN 7 9  CREATININE 0.34* <0.30*  CALCIUM 7.2* 7.8*   PT/INR No results for input(s): LABPROT, INR in the last 72 hours. ABG No results for input(s): PHART, HCO3 in the last 72 hours.  Invalid input(s): PCO2, PO2  Studies/Results: CT Angio Abd/Pel w/ and/or w/o  Result Date: 10/29/2019 CLINICAL DATA:  54 year old history of pneumoperitoneum and a large perforation in the  rectosigmoid region. Patient underwent exploratory laparotomy with lysis of adhesion and segmental colectomy with end colostomy. Patient was also noted to have new thrombus in the abdominal aorta on the CT from 10/23/2019. CTA examination performed in order to follow-up the aortic thrombus. EXAM: CT ANGIOGRAPHY ABDOMEN AND PELVIS WITH CONTRAST AND WITHOUT CONTRAST TECHNIQUE: Multidetector CT imaging of the abdomen and pelvis was performed using the standard protocol during bolus administration of intravenous contrast. Multiplanar reconstructed images and MIPs were obtained and reviewed to evaluate the vascular anatomy. CONTRAST:  164m OMNIPAQUE IOHEXOL 350 MG/ML SOLN COMPARISON:  CT abdomen pelvis 10/23/2019 FINDINGS: VASCULAR Aorta: Normal caliber of the distal descending thoracic aorta. Atherosclerotic calcifications involving the juxtarenal and infrarenal abdominal aorta. Negative for an aortic dissection or aneurysm. Again noted is thrombus along the right side of the infrarenal abdominal aortic wall. This is elongated thrombus and measures roughly 1.3 cm in length. The overall length of the thrombus has not significantly changed but the width has decreased measuring roughly 0.3 cm opposed to 0.5 cm. This thrombus is new since 10/02/2019. No significant stenosis in the abdominal aorta. Aortic bifurcation is widely patent. Celiac: Patent without evidence of aneurysm, dissection, vasculitis or significant stenosis. SMA: Patent without evidence of aneurysm, dissection, vasculitis or significant stenosis. Renals: Single left renal artery is widely patent without aneurysm, dissection or significant stenosis. There are 2 right-sided renal arteries. Right renal arteries are patent without aneurysm, dissection or significant stenosis. IMA: Patent without evidence of aneurysm, dissection, vasculitis or significant stenosis. Inflow: Patent without evidence of aneurysm, dissection, vasculitis or significant  stenosis.  Proximal Outflow: Proximal femoral arteries are patent bilaterally. Veins: Hepatic veins are patent. Portal venous system is patent. IVC and renal veins are patent. No gross abnormality to the iliac veins. Review of the MIP images confirms the above findings. NON-VASCULAR Lower chest: Again noted are bilateral pleural effusions. Right effusion has enlarged in size. There may be slight enlargement of the left pleural effusion. Increased compressive atelectasis in both lower lobes. Hepatobiliary: Trace perihepatic ascites. Normal appearance of the gallbladder. No focal liver lesion. No significant biliary dilatation. Pancreas: Unremarkable. No pancreatic ductal dilatation or surrounding inflammatory changes. Spleen: Normal in size without focal abnormality. Adrenals/Urinary Tract: Normal appearance of the adrenal glands. New low-density peripheral wedge-shaped area along the right kidney lower pole. This was not present on the previous examinations and suspect this is related to small infarct based on the aortic thrombus. This area measures up to 1.6 cm. Normal enhancement the left kidney. Normal appearance of the urinary bladder. Stomach/Bowel: Patient now has a Hartmann's pouch which contains high-density contrast. 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 Again noted is a dilated transverse colon with some wall thickening involving the hepatic flexure and mild wall thickening in the ascending colon. Cecum appears to be fluid-filled and poorly defined. No acute abnormality to the stomach or small bowel. Lymphatic: No significant lymph node enlargement in the abdomen or pelvis. Reproductive: Uterus and bilateral adnexa are unremarkable. Other: Right abdominal surgical drain that extends into the right pelvic region. Poorly defined fluid and gas along the anterior abdominal cavity near the midline incision. There is poorly defined fluid along the anterior abdominal cavity measures roughly 4.5 x 0.9 x 2.4 cm.  Fluid-filled structure in the right hemipelvic region appears to be related to the cecum rather than extraluminal fluid collection. Small amount of fluid along the lateral left abdomen sequence 13, image 37. Trace perisplenic fluid. Diffuse subcutaneous edema. Question a small fluid collection between loops of bowel in the mid abdomen on sequence 13, image 51 that measures roughly 1.7 cm. Poorly developed fluid along the posterior aspect of the ascending colon on sequence 13, image 48 measuring up to 1.5 cm. Musculoskeletal: No acute bone abnormality. IMPRESSION: VASCULAR 1. Thrombus along the right side of the infrarenal abdominal aorta has decreased in size compared to the exam on 10/23/2019. Suspect this is related to thromboembolic disease since it was not present on the exam from 10/02/2019. No new arterial thrombus in the abdomen or pelvis. 2.  Aortic Atherosclerosis (ICD10-I70.0). NON-VASCULAR 1. New peripheral wedge-shaped low-density area in the right kidney lower pole. Based on the arterial thrombus, suspect this is related to a small renal infarct. Small focus of pyelonephritis is also in the differential but thought to be less likely. 2. Extensive postoperative changes compatible with partial colectomy and creation of left abdominal colostomy. 3. Scattered small fluid collections in the abdomen. These small fluid collections are poorly defined and could represent postoperative change. Consider surveillance to exclude developing abscess formations. 4. Bilateral pleural effusions with extensive compressive atelectasis at the lung bases. Right pleural effusion has enlarged compared to the exam on 10/23/2019. 5. Persistent wall thickening involving the right colon. Findings are suggestive for bowel inflammation. Electronically Signed   By: Markus Daft M.D.   On: 10/29/2019 08:58    Anti-infectives: Anti-infectives (From admission, onward)   Start     Dose/Rate Route Frequency Ordered Stop   10/24/19  0600  piperacillin-tazobactam (ZOSYN) IVPB 3.375 g  Status:  Discontinued  3.375 g 12.5 mL/hr over 240 Minutes Intravenous Every 8 hours 10/23/19 2305 10/29/19 0932   10/24/19 0000  piperacillin-tazobactam (ZOSYN) IVPB 4.5 g  Status:  Discontinued        4.5 g 200 mL/hr over 30 Minutes Intravenous Every 6 hours 10/23/19 2257 10/23/19 2302   10/23/19 2315  piperacillin-tazobactam (ZOSYN) IVPB 3.375 g        3.375 g 100 mL/hr over 30 Minutes Intravenous STAT 10/23/19 2302 10/23/19 2347     Assessment/Plan: 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 Henrene Pastor, per GI, started Solumedrol 7/27 Sigmoid colon perforation - S/P partial colectomy and end colostomy by Dr. Kae Heller 7/24. SOFT diet, continue TPN, POD#6. WBC downtrending (10 from 13). Monitor for signs of abscess/leak given recent addition of IV steroids. Continue scheduled non-narcotic pain meds, PRN oxy, PRN HM for breakthrough. Consider repeat CT abd/pelvis with IV and PO contrast in the next couple of days if patient spikes fever, WBC increases, or continues to show a lack of clinical progression w/ abd pain/nausea. Sepsis - per primary team, off pressors  Acute hypoxic respiratory failure  Protein calorie malnutrition  Acute on chronic anemia - receiving 1 u pRBC today 7/20  FEN: TNA, SOFT diet + ensure/boost, poor PO intake- monitor  ID: Zosyn 7/23-7/29   VTE: SCD's, hep gtt Foley: removed 7/26, voiding spontaneously  Dispo: ICU     LOS: 11 days     Jill Alexanders 10/30/2019

## 2019-10-30 NOTE — Progress Notes (Signed)
Most recent CT scan reviewed and compared to scan from 7/29. Large hematoma present, however in the current post-operative period, re-operation would likely cause more harm than benefit. Would recommend IR attempted drainage of hematoma to relieve compressive effects on the kidney. If unable to identify a safe plane, would recommend percutaneous nephrostomy tube placement for renal decompression. In the interim, would recommend Q8H CBC x24h, transfuse for symptomatic anemia <7, TEG based resuscitation of additional clotting factors, hold all anticoagulation and anti-platelet agents. Recommend initiation of vitamin A therapy to combat negative wound healing effects of steroids.  Jesusita Oka, MD General and [facility]

## 2019-10-31 ENCOUNTER — Inpatient Hospital Stay (HOSPITAL_COMMUNITY): Payer: 59

## 2019-10-31 DIAGNOSIS — S3681XA Injury of peritoneum, initial encounter: Secondary | ICD-10-CM | POA: Diagnosis present

## 2019-10-31 DIAGNOSIS — K631 Perforation of intestine (nontraumatic): Secondary | ICD-10-CM | POA: Diagnosis not present

## 2019-10-31 DIAGNOSIS — N280 Ischemia and infarction of kidney: Secondary | ICD-10-CM | POA: Diagnosis present

## 2019-10-31 DIAGNOSIS — D62 Acute posthemorrhagic anemia: Secondary | ICD-10-CM | POA: Diagnosis not present

## 2019-10-31 DIAGNOSIS — I313 Pericardial effusion (noninflammatory): Secondary | ICD-10-CM

## 2019-10-31 LAB — CBC
HCT: 19.3 % — ABNORMAL LOW (ref 36.0–46.0)
HCT: 23.2 % — ABNORMAL LOW (ref 36.0–46.0)
HCT: 23.6 % — ABNORMAL LOW (ref 36.0–46.0)
HCT: 25.9 % — ABNORMAL LOW (ref 36.0–46.0)
HCT: 27.9 % — ABNORMAL LOW (ref 36.0–46.0)
HCT: 31.2 % — ABNORMAL LOW (ref 36.0–46.0)
Hemoglobin: 10.4 g/dL — ABNORMAL LOW (ref 12.0–15.0)
Hemoglobin: 6.2 g/dL — CL (ref 12.0–15.0)
Hemoglobin: 7.7 g/dL — ABNORMAL LOW (ref 12.0–15.0)
Hemoglobin: 7.8 g/dL — ABNORMAL LOW (ref 12.0–15.0)
Hemoglobin: 8.8 g/dL — ABNORMAL LOW (ref 12.0–15.0)
Hemoglobin: 9.4 g/dL — ABNORMAL LOW (ref 12.0–15.0)
MCH: 30.2 pg (ref 26.0–34.0)
MCH: 30.7 pg (ref 26.0–34.0)
MCH: 30.9 pg (ref 26.0–34.0)
MCH: 31.1 pg (ref 26.0–34.0)
MCH: 31.1 pg (ref 26.0–34.0)
MCH: 31.6 pg (ref 26.0–34.0)
MCHC: 32.1 g/dL (ref 30.0–36.0)
MCHC: 33.1 g/dL (ref 30.0–36.0)
MCHC: 33.2 g/dL (ref 30.0–36.0)
MCHC: 33.3 g/dL (ref 30.0–36.0)
MCHC: 33.7 g/dL (ref 30.0–36.0)
MCHC: 34 g/dL (ref 30.0–36.0)
MCV: 89.7 fL (ref 80.0–100.0)
MCV: 91.5 fL (ref 80.0–100.0)
MCV: 92.9 fL (ref 80.0–100.0)
MCV: 93.2 fL (ref 80.0–100.0)
MCV: 93.4 fL (ref 80.0–100.0)
MCV: 98.5 fL (ref 80.0–100.0)
Platelets: 207 10*3/uL (ref 150–400)
Platelets: 207 10*3/uL (ref 150–400)
Platelets: 214 10*3/uL (ref 150–400)
Platelets: 215 10*3/uL (ref 150–400)
Platelets: 217 10*3/uL (ref 150–400)
Platelets: 265 10*3/uL (ref 150–400)
RBC: 1.96 MIL/uL — ABNORMAL LOW (ref 3.87–5.11)
RBC: 2.49 MIL/uL — ABNORMAL LOW (ref 3.87–5.11)
RBC: 2.54 MIL/uL — ABNORMAL LOW (ref 3.87–5.11)
RBC: 2.83 MIL/uL — ABNORMAL LOW (ref 3.87–5.11)
RBC: 3.11 MIL/uL — ABNORMAL LOW (ref 3.87–5.11)
RBC: 3.34 MIL/uL — ABNORMAL LOW (ref 3.87–5.11)
RDW: 18.9 % — ABNORMAL HIGH (ref 11.5–15.5)
RDW: 19.1 % — ABNORMAL HIGH (ref 11.5–15.5)
RDW: 19.5 % — ABNORMAL HIGH (ref 11.5–15.5)
RDW: 19.6 % — ABNORMAL HIGH (ref 11.5–15.5)
RDW: 19.9 % — ABNORMAL HIGH (ref 11.5–15.5)
RDW: 22.5 % — ABNORMAL HIGH (ref 11.5–15.5)
WBC: 17.6 10*3/uL — ABNORMAL HIGH (ref 4.0–10.5)
WBC: 19.3 10*3/uL — ABNORMAL HIGH (ref 4.0–10.5)
WBC: 19.5 10*3/uL — ABNORMAL HIGH (ref 4.0–10.5)
WBC: 20.9 10*3/uL — ABNORMAL HIGH (ref 4.0–10.5)
WBC: 21.2 10*3/uL — ABNORMAL HIGH (ref 4.0–10.5)
WBC: 22.3 10*3/uL — ABNORMAL HIGH (ref 4.0–10.5)
nRBC: 0.4 % — ABNORMAL HIGH (ref 0.0–0.2)
nRBC: 0.5 % — ABNORMAL HIGH (ref 0.0–0.2)
nRBC: 0.7 % — ABNORMAL HIGH (ref 0.0–0.2)
nRBC: 0.9 % — ABNORMAL HIGH (ref 0.0–0.2)
nRBC: 0.9 % — ABNORMAL HIGH (ref 0.0–0.2)
nRBC: 1 % — ABNORMAL HIGH (ref 0.0–0.2)

## 2019-10-31 LAB — PLATELET MAPPING ADP AND AA
AA Aggregation: 94.9 % (ref 89–100)
AA Inhibition: 5.1 % (ref 0–11)
ADP Aggregation: 84.6 % (ref 83–100)
ADP Inhibition: 15.4 % (ref 0–17)
Activator F: 18 mm (ref 2–19)
Adenosine 5 Diphosphate (MA): 59.7 mm (ref 45–69)
Arachidonic Acid (MA): 64.8 mm (ref 51–71)
Kaolin with Heparinase: 67.3 mm (ref 53–68)

## 2019-10-31 LAB — COMPREHENSIVE METABOLIC PANEL
ALT: 37 U/L (ref 0–44)
AST: 15 U/L (ref 15–41)
Albumin: 1.5 g/dL — ABNORMAL LOW (ref 3.5–5.0)
Alkaline Phosphatase: 77 U/L (ref 38–126)
Anion gap: 7 (ref 5–15)
BUN: 16 mg/dL (ref 6–20)
CO2: 25 mmol/L (ref 22–32)
Calcium: 7.3 mg/dL — ABNORMAL LOW (ref 8.9–10.3)
Chloride: 101 mmol/L (ref 98–111)
Creatinine, Ser: 0.31 mg/dL — ABNORMAL LOW (ref 0.44–1.00)
GFR calc Af Amer: >60 mL/min (ref >60)
GFR calc non Af Amer: >60 mL/min (ref >60)
Glucose, Bld: 134 mg/dL — ABNORMAL HIGH (ref 70–99)
Potassium: 3.8 mmol/L (ref 3.5–5.1)
Sodium: 133 mmol/L — ABNORMAL LOW (ref 135–145)
Total Bilirubin: 1.1 mg/dL (ref 0.3–1.2)
Total Protein: 4.1 g/dL — ABNORMAL LOW (ref 6.5–8.1)

## 2019-10-31 LAB — GLOBAL TEG PANEL
CFF Max Amplitude: 24.9 mm (ref 15–32)
CK with Heparinase (R): 5.4 min (ref 4.3–8.3)
Citrated Functional Fibrinogen: 454.4 mg/dL (ref 278–581)
Citrated Kaolin (K): 0.8 min (ref 0.8–2.1)
Citrated Kaolin (MA): 66.5 mm (ref 52–69)
Citrated Kaolin (R): 5.4 min (ref 4.6–9.1)
Citrated Kaolin Angle: 77.9 deg (ref 63–78)
Citrated Rapid TEG (MA): 65.4 mm (ref 52–70)

## 2019-10-31 LAB — ECHOCARDIOGRAM LIMITED
Height: 61 in
Weight: 2716.07 oz

## 2019-10-31 LAB — GLUCOSE, CAPILLARY
Glucose-Capillary: 115 mg/dL — ABNORMAL HIGH (ref 70–99)
Glucose-Capillary: 119 mg/dL — ABNORMAL HIGH (ref 70–99)
Glucose-Capillary: 124 mg/dL — ABNORMAL HIGH (ref 70–99)
Glucose-Capillary: 127 mg/dL — ABNORMAL HIGH (ref 70–99)
Glucose-Capillary: 167 mg/dL — ABNORMAL HIGH (ref 70–99)
Glucose-Capillary: 206 mg/dL — ABNORMAL HIGH (ref 70–99)
Glucose-Capillary: 95 mg/dL (ref 70–99)

## 2019-10-31 LAB — MAGNESIUM: Magnesium: 2.2 mg/dL (ref 1.7–2.4)

## 2019-10-31 LAB — PHOSPHORUS: Phosphorus: 2.9 mg/dL (ref 2.5–4.6)

## 2019-10-31 LAB — PROTIME-INR
INR: 1.2 (ref 0.8–1.2)
Prothrombin Time: 14.8 seconds (ref 11.4–15.2)

## 2019-10-31 LAB — HEPARIN LEVEL (UNFRACTIONATED)
Heparin Unfractionated: <0.1 IU/mL — ABNORMAL LOW (ref 0.30–0.70)
Heparin Unfractionated: <0.1 IU/mL — ABNORMAL LOW (ref 0.30–0.70)

## 2019-10-31 LAB — LACTIC ACID, PLASMA: Lactic Acid, Venous: 1.7 mmol/L (ref 0.5–1.9)

## 2019-10-31 MED ORDER — VANCOMYCIN HCL IN DEXTROSE 1-5 GM/200ML-% IV SOLN
1000.0000 mg | Freq: Two times a day (BID) | INTRAVENOUS | Status: DC
  Administered 2019-10-31 – 2019-11-01 (×2): 1000 mg via INTRAVENOUS
  Filled 2019-10-31 (×2): qty 200

## 2019-10-31 MED ORDER — TRAVASOL 10 % IV SOLN
INTRAVENOUS | Status: AC
  Filled 2019-10-31: qty 900

## 2019-10-31 MED ORDER — LACTATED RINGERS IV SOLN: INTRAVENOUS | Status: DC

## 2019-10-31 MED ORDER — SODIUM CHLORIDE 0.9 % IV SOLN
2.0000 g | Freq: Three times a day (TID) | INTRAVENOUS | Status: DC
  Administered 2019-10-31 – 2019-11-01 (×3): 2 g via INTRAVENOUS
  Filled 2019-10-31 (×5): qty 2

## 2019-10-31 MED ORDER — PIPERACILLIN-TAZOBACTAM 3.375 G IVPB
3.3750 g | Freq: Three times a day (TID) | INTRAVENOUS | Status: DC
  Administered 2019-10-31: 3.375 g via INTRAVENOUS
  Filled 2019-10-31: qty 50

## 2019-10-31 MED ORDER — IOHEXOL 350 MG/ML SOLN
100.0000 mL | Freq: Once | INTRAVENOUS | Status: AC | PRN
  Administered 2019-10-31: 100 mL via INTRAVENOUS

## 2019-10-31 MED ORDER — VANCOMYCIN HCL 750 MG/150ML IV SOLN
750.0000 mg | Freq: Two times a day (BID) | INTRAVENOUS | Status: DC
  Filled 2019-10-31: qty 150

## 2019-10-31 MED ORDER — VANCOMYCIN HCL 1500 MG/300ML IV SOLN
1500.0000 mg | Freq: Once | INTRAVENOUS | Status: AC
  Administered 2019-10-31: 1500 mg via INTRAVENOUS
  Filled 2019-10-31: qty 300

## 2019-10-31 MED ORDER — PERFLUTREN LIPID MICROSPHERE
1.0000 mL | INTRAVENOUS | Status: AC | PRN
  Administered 2019-10-31: 6 mL via INTRAVENOUS
  Filled 2019-10-31: qty 10

## 2019-10-31 NOTE — Progress Notes (Addendum)
Paged by radiology at 23:00 regarding new hemorrhage/hematoma, approximately 800 cc, that is new from prior imaging 24 hours ago. No extravasation of contrast seen. There is right hydronephrosis from compression of the right ureter. In addition, previous singular renal infarct has progressed to numerous infarcts. Imaging also shows increased small volume fluid collection with associated gas near the uterus, as well as extraluminal gas along the caudal aspect of the hematoma.   On examination, patient reports abdominal cramping located in the periumbilical region. She notes new bleeding from her wounds but is unsure where it is coming from. She feels a little more sleepy than earlier but denies any brain fog.   Physical Examination:  General: Alert, in no acute distress Respiratory: Breathing comfortably on room air. No tachypnea.  Cardiac: Tachycardia with regular rhythm.  Abdominal: TTP in the periumbilical region. Minimal abdominal distention. Surgical incision is open with gauze packing that is saturated with dark blood. No evidence of active bleed from incision. Ostomy site located in the LUQ. There is some dried blood and fresh blood located around the ostomy, as well as from within the ostomy. No longer actively draining blood.  Skin: Mottled lower extremities bilaterally. Pale skin tone.  Neuro: Alert to person and location. She is disoriented to what brought her in or why she is here, states "she must have done something to the baby" Following commands appropriately.   Assessment/Plan:   # Abdominal Hemorrhage/Hematoma New hematoma/hemorrhage with hypovolemia noted with IVC size. Source of bleeding is not identifiable on imaging. Given there is no extravasation of contrast though, bleed may have slowed substantially or resolved. After examination, I suspect that bleeding noted by patient was coming from ostomy site.   Addition of anti-coagulation post-op for treatment of an infrarenal  thrombus likely increased patient risk of developing hematoma. Heparin was discontinued at this time.   Post-CT imaging, patient was hemodynamically stable with MAPs in the 80s, although sinus tachy with HR 110s. On our arrival to exam patient, her BP had begun to decline to MAPs in the 60s-70s, HR increasing to the 120s. 2 units of pRBCs were emergently released, and LR was administered. Patient had good response to fluid resuscitation with MAPs improving to the 80s.   Contacted on-call general surgeon, Dr. Bobbye Morton. No surgical intervention indicated at this time given she is 3 days post-op. Recommended following CBC, ordering TEG and fluid resuscitation.   Consulted with IR, Dr. Pascal Lux - No indication at this time for IR drainage of hematoma. Will evaluate patient in the AM.   - CBC, hep levels, TEG  - Discontinue Heparin - Transfuse 2 units of pRBCs, 2 more on hold - 1 L LR bolus  - Will consider PCCM re-consult for pressor support if patient's BP is not sustainable with fluid resuscitation and blood transfusion - Transfer to ICU for closer monitoring  # Right Hydronephrosis 2/2 right ureter compression  Patient continues to have urine output throughout today and renal function WNL this morning, however at risk of developing profound renal failure with the combination of hydronephrosis and renal infarcts. Will check BMP to evaluate for worsening in renal function.   - BMP - Consider IR nephrostomy tube placement if hemorrhage/hematoma cannot be intervened on.   # Infrarenal Thrombus  # Renal infarcts, bilateral  Worsening renal infarcts, as numerous infarcts have developed since last imaging. Suspect thrombus seen previously on imaging likely related to this process. Will need to hold heparin though in light of hematoma.   -  Discontinue Heparin  # Leukocytosis WBC has doubled in the past 24 hours from 10.2 to 22.3. CT findings of increased volume of fluid collection with simple fluid  density and some associated gas is concerning for developing infection, of which patient is certainly at high risk of. She is currently on Solumedrol, however she has bene on this for several days with WBC around 10. Will restart antibiotics at this time given patient's critical condition and very high risk of infection.  - Vancomycin per pharmacy  - Zosyn per pharmacy  - Blood cultures x2   Dr. Jose Persia Internal Medicine PGY-2  Pager: [phone] After 5pm on weekdays and 1pm on weekends: On Call pager [phone]  10/31/2019, 12:51 AM   ADDENDUM:  Patient re-evaluated and resting comfortably. She endorses some continued abdominal cramping but notes it has improved. She endorses feeling cold at the moment from the pRBCs.    Vital signs have stabilized with BP of 125/86 (MAP 94), HR 102. RR 20 saturating 100% on RA.  Ostomy site re-evaluated and bag has new ~ 75 cc of dark red blood. No leakage around ostomy site.  Incision site is clean with no signs of bleeding.    ADDENDUM:  Patient remained hemodynamically stable since last evaluation. No additional episodes of hypotension. Still borderline tachycardia. Hemoglobin has improved to 10.4 with 2 units. TEG results are WNL. Heparin levels are down.   Another 100 cc of bloody output noted from ostomy, in addition to oozing from incision. Will contact general surgery to evaluate patient at bedside.

## 2019-10-31 NOTE — Progress Notes (Signed)
Examined and evaluated at bedside in PM. Observed to be resting comfortably in bed. Husband at bedside. Noted to have continued bloody drainage from abdominal drain. Mildly worsening lower extremity edema. Denies significant abdominal pain or dyspnea. Mostly complaining of dry mouth. Discussed in detail with husband and Natasha Barrera regarding current plan to monitor labs for now.  - C/w monitor

## 2019-10-31 NOTE — Plan of Care (Signed)
  Problem: Clinical Measurements: Goal: Ability to maintain clinical measurements within normal limits will improve Outcome: Progressing   

## 2019-10-31 NOTE — Progress Notes (Signed)
Patient ID: RASHAUN CURL, female   DOB: September 27, 1965, 54 y.o.   MRN: [id number]   CT scan abdomen and pelvis performed 10/30/2019 and 10/29/2019 were reviewed in detail and demonstrated interval development of a large (at least 11.3 cm) hematoma within the right retroperitoneum with associated hematocrit level and development of innumerable bilateral renal infarcts.  Given rapidly enlarging hematoma, the patient is NOT a candidate for percutaneous drainage catheter placement at this time given concern for ongoing bleeding presumably attributable to arterial embolism.  Patient does have very mild right-sided pelviectasis, though currently with preservation of renal function, though I would assume renal function will deteriorate given extensive bilateral renal infarcts.    Regardless, if urinary obstruction worsens, would recommend urologic referral for consideration of right-sided double-J ureteral stent, though presently this is of lesser concern given pt's other acute co-morbidities.  Would consider obtaining a cardiac echo (to evaluate for cardiogenic source of suspected arterial emboli) and/or dissection protocol chest CT (to evaluate for unstable atherosclerotic plaque and/or thoracic aortic dissection as a source of arterial emboli).  Above discussed in detail with Saverio Danker, PA with [facility].  Ronny Bacon, MD Pager #: [phone]

## 2019-10-31 NOTE — Progress Notes (Signed)
Pharmacy Antibiotic Note  Natasha Barrera is a 54 y.o. female admitted with sigmoid colon perforation s/p repair, now new hematoma and leukocytosis.  Pharmacy has been consulted for Vancomycin and Zosyn dosing.  Plan: Vancomycin 1500 mg IV now, then 750 mg IV q12h Zosyn 3.375 g IV q8h   Height: 5' 1"  (154.9 cm) Weight: 77 kg (169 lb 12.1 oz) IBW/kg (Calculated) : 47.8  Temp (24hrs), Avg:97.7 F (36.5 C), Min:97.2 F (36.2 C), Max:98.4 F (36.9 C)  Recent Labs  Lab 10/28/19 0511 10/28/19 1601 10/29/19 0504 10/30/19 0355 10/30/19 1126 10/30/19 1744 10/30/19 2146 10/30/19 2330  WBC 13.7*  --  13.5* 10.6* 10.2  --   --  22.3*  CREATININE 0.35* 0.36* 0.34* <0.30*  --  0.32*  --   --   LATICACIDVEN  --   --   --   --   --  2.6* 3.8*  --     Estimated Creatinine Clearance: 74.6 mL/min (A) (by C-G formula based on SCr of 0.32 mg/dL (L)).    No Known Allergies   Caryl Pina 10/31/2019 12:43 AM

## 2019-10-31 NOTE — Progress Notes (Signed)
Pharmacy Antibiotic Note  Natasha Barrera is a 54 y.o. female admitted on 10/18/2019 with sigmoid colon perforation s/p repair. Patient now with new hematoma and leukocytosis.   Pharmacy has been consulted for vancomycin and meropenem dosing for concern of intra-abdominal infection. Patient originally started on Zosyn last night and now transitioning to meropenem. WBC 17.6. Afebrile. Scr at baseline. Current CrCl 75 ml/min.   Plan: Adjust maintenance dose of vancomycin to 1071m IV q12h based on nomogram for goal trough 15-20 Start meropenem 2g q8h Monitor renal function, cultures/sensitivities, and clinical progression.    Height: 5' 1"  (154.9 cm) Weight: 77 kg (169 lb 12.1 oz) IBW/kg (Calculated) : 47.8  Temp (24hrs), Avg:97.6 F (36.4 C), Min:97.2 F (36.2 C), Max:98.4 F (36.9 C)  Recent Labs  Lab 10/28/19 0511 10/28/19 1601 10/29/19 0504 10/30/19 0355 10/30/19 1126 10/30/19 1744 10/30/19 2146 10/30/19 2330 10/31/19 0357 10/31/19 0548  WBC   < >  --  13.5* 10.6* 10.2  --   --  22.3* 17.6*  --   CREATININE  --  0.36* 0.34* <0.30*  --  0.32*  --   --   --  0.31*  LATICACIDVEN  --   --   --   --   --  2.6* 3.8*  --   --   --    < > = values in this interval not displayed.    Estimated Creatinine Clearance: 74.6 mL/min (A) (by C-G formula based on SCr of 0.31 mg/dL (L)).    No Known Allergies  Antimicrobials this admission: Zosyn 7/23 >> 7/29; x1 7/31  Vancomycin 7/31 >> Meropenem 7/31 >>  Dose adjustments this admission: N/A  Microbiology results: 7/31 BCx:  7/23 MRSA PCR negative  7/20 c. Dif negative   Thank you for allowing pharmacy to be a part of this patient's care.   GCristela Felt PharmD Clinical Pharmacist  10/31/2019 7:30 AM

## 2019-10-31 NOTE — Progress Notes (Signed)
PHARMACY - TOTAL PARENTERAL NUTRITION CONSULT NOTE   Indication: Prolonged ileus  Patient Measurements: Height: 5' 1"  (154.9 cm) Weight: 77 kg (169 lb 12.1 oz) IBW/kg (Calculated) : 47.8 TPN AdjBW (KG): 55.1 Body mass index is 32.07 kg/m.  Assessment:  67 yof with remote history of sigmoid colectomy and recent admission d/t colitis initially presented with lower extremity edema and later developed abdominal pain with decreased appetite. S/p EGD + colonoscopy on 7/21 that showed Crohn's colitis. On 7/23 a CT showed large amount pneumoperitoneum and patients underwent exlap with partial colectomy and colostomy. To start TPN for anticipated prolonged ileus.   On 7/30 repeat CT abdomen was obtained and found new large hematoma in R peritoneal cavity and patient was transferred back to the ICU for close monitoring. IR consulted for possible drainage of hematoma. Not requiring pressors at this time. Vancomycin and meropenem were started for concern of intra-abdominal infection.   Glucose / Insulin: CBGs ranging from 127-206, mainly 140s. Received 20 units of rSSI in 24 hours. Continues on solumedrol 23m q12h Electrolytes: Na 133. Other electrolytes wnl.  Renal: SCr stable. UOP 0.6 ml/kg/hr with 2 undocumented occurrences.  LFTs / TGs: LFTs WNL. T bili 1.1 - increase. TG 271 on 7/26.  Prealbumin / albumin: prealbumin <5, albumin 1.5 Intake / Output; MIVF: 695 mL / 24 hrs drain output, 200 mL / 24 hrs colostomy output. Starting LR @ 100 ml/hr GI Imaging: 7/23 Abd xray - ileus vs SBO 7/23 CT abd - Extensive pneumoperitoneum, most consistent with colonic perforation given the persistent diffuse colitis 7/29 CT abd - scattered small fluid collections, bowel inflammation 7/30 CT abd - R peritoneal cavity hematoma (at least 8056m, dilated colostomy loop and stomach containing air   Surgeries / Procedures:  7/21 Colonoscopy/EGD 7/24 Exlap, partial colectomy and colostomy  Central access: PICC  placed 7/25 TPN start date: 7/25  Nutritional Goals (per RD recommendation on 7/29): kCal: 1700-1900, Protein: 90-105g, Fluid: >1.7L  Goal TPN rate is 75 mL/hr (provides 90 g of protein and 1849 kcals per day)  Current Nutrition:  Soft diet transitioned back to NPO Ensure twice daily - on hold  Boost three times daily - on hold TPN  Plan:  Continue TPN at goal rate of 7574mr at 1800 - provides 90 g of protein and 1849 kcals per day (100% goal calories)   Electrolytes in TPN:  Na increased to 150 mEq/L, K 37m13m, 5mEq48mof Ca, 5mEq/11mf Mg, and 15mmol48mf Phos. Cl:Ac ratio 1:1 Add 10 units insulin to TPN Continue Resistant q4h SSI and adjust as needed  Continue MIVF per MD Add standard MVI and trace elements to TPN and monitor tbili closely Monitor TPN labs on Mon/Thurs, repeat electrolytes tomorrow    Laurita Peron BCristela FeltD Clinical Pharmacist

## 2019-10-31 NOTE — Progress Notes (Signed)
7 Days Post-Op  Subjective: Patient awake.  Got 2 units of pRBCs overnight due to mesenteric hematoma.  Stable this morning.  hgb down from 10 to 9.  Not really having much abdominal pain.  ROS: See above, otherwise other systems negative  Objective: Vital signs in last 24 hours: Temp:  [97.2 F (36.2 C)-98.2 F (36.8 C)] 98.2 F (36.8 C) (07/31 0800) Pulse Rate:  [79-118] 95 (07/31 1200) Resp:  [15-31] 15 (07/31 1200) BP: (79-130)/(54-103) 105/72 (07/31 1200) SpO2:  [88 %-100 %] 93 % (07/31 1200) Last BM Date: 10/31/19  Intake/Output from previous day: 07/30 0701 - 07/31 0700 In: 4145.5 [P.O.:100; I.V.:987; Blood:735; IV Piggyback:2323.5] Out: 1542.5 [Urine:647.5; Drains:695; Stool:200] Intake/Output this shift: Total I/O In: 88.8 [I.V.:75.1; IV Piggyback:13.7] Out: 340 [Urine:190; Drains:150]  PE: Abd: soft, colostomy with limited old thin bloody output, stoma is viable, midline wound is relatively clean, but with some fibrin at the base, some fascial separation.  JP drain full with bloody output.  Lab Results:  Recent Labs    10/31/19 0357 10/31/19 0817  WBC 17.6* 20.9*  HGB 10.4* 9.4*  HCT 31.2* 27.9*  PLT 217 207   BMET Recent Labs    10/30/19 1744 10/31/19 0548  NA 130* 133*  K 4.2 3.8  CL 97* 101  CO2 25 25  GLUCOSE 155* 134*  BUN 15 16  CREATININE 0.32* 0.31*  CALCIUM 7.6* 7.3*   PT/INR No results for input(s): LABPROT, INR in the last 72 hours. CMP     Component Value Date/Time   NA 133 (L) 10/31/2019 0548   K 3.8 10/31/2019 0548   CL 101 10/31/2019 0548   CO2 25 10/31/2019 0548   GLUCOSE 134 (H) 10/31/2019 0548   BUN 16 10/31/2019 0548   CREATININE 0.31 (L) 10/31/2019 0548   CALCIUM 7.3 (L) 10/31/2019 0548   PROT 4.1 (L) 10/31/2019 0548   ALBUMIN 1.5 (L) 10/31/2019 0548   AST 15 10/31/2019 0548   ALT 37 10/31/2019 0548   ALKPHOS 77 10/31/2019 0548   BILITOT 1.1 10/31/2019 0548   GFRNONAA >60 10/31/2019 0548   GFRAA >60  10/31/2019 0548   Lipase     Component Value Date/Time   LIPASE 18 09/19/2019 1631       Studies/Results: CT ABDOMEN PELVIS W CONTRAST  Addendum Date: 10/30/2019   ADDENDUM REPORT: 10/30/2019 23:04 ADDENDUM: Critical Value/emergent results were called by telephone at the time of interpretation on 10/30/2019 at 1059 hours to provider Dr. Wynetta Emery Who verbally acknowledged these results. Additionally, please note that Impression #3 and #4 should have been combined into a single Impression as: "New foci of extraluminal gas and increased small volume of low-density fluid along the caudal aspect of the hematoma near the uterine fundus". Electronically Signed   By: Genevie Ann M.D.   On: 10/30/2019 23:04   Result Date: 10/30/2019 CLINICAL DATA:  54 year old female with history of Crohn disease. Perforated rectosigmoid anastomosis. Postoperative day 6 status post exploratory laparotomy, lysis of adhesions, segmental colectomy and end colostomy. Thrombus in the infrarenal aorta on CTA yesterday with solitary, small right renal infarct suspected. EXAM: CT ABDOMEN AND PELVIS WITH CONTRAST TECHNIQUE: Multidetector CT imaging of the abdomen and pelvis was performed using the standard protocol following bolus administration of intravenous contrast. CONTRAST:  169m OMNIPAQUE IOHEXOL 300 MG/ML  SOLN COMPARISON:  Postoperative CTA abdomen and pelvis 10/29/2019 FINDINGS: Lower chest: Moderate to large bilateral layering pleural effusions are stable since yesterday. Compressive atelectasis. No pericardial  effusion. The distal esophagus is fluid-filled with possible hyperenhancing mucosa (series 3, image 2). Hepatobiliary: Negative aside from a small volume of simple appearing perihepatic fluid. Pancreas: Negative. Spleen: Diminutive spleen, with a small volume of perisplenic fluid. Adrenals/Urinary Tract: Negative adrenal glands. Numerous bilateral renal infarcts now, substantially progressed since yesterday. Associated  new right hydronephrosis, which may be related to compression of the right ureter from the mesenteric hemorrhage described below. Diminutive urinary bladder with some contrast excretion into the bladder. Stomach/Bowel: Large new since yesterday oval hemorrhage/hematoma in the right peritoneal cavity with a layering hematocrit level encompasses 95 x 113 by 158 mL (AP by transverse by CC) for an estimated blood volume of at least 800 mL. See series 3, image 53, coronal image 54. No associated contrast extravasation identified. Superimposed small volume of low-density free fluid in the right abdomen and pelvis. Stable ventral abdominal percutaneous drain which courses to the lower peritoneal cavity abutting the bladder dome. No free intraperitoneal air, although there is new extraluminal gas along the caudal aspect of the hematoma near the uterine fundus, also detailed below. Gas and fluid distended stomach. Hematoma mass effect on the 2nd portion of the duodenum. Dilated, air and fluid fills ostomy loop. But no upstream dilated bowel. Vascular/Lymphatic: Aortoiliac calcified atherosclerosis. The arterial large branches in the abdomen and pelvis appear to remain patent. Portal venous system appears patent. The IVC is slit-like. Reproductive: Increased small volume fluid collection ventral to the uterus with simple fluid density and some associated foci of gas (series 3, image 71). Additional extraluminal appearing gas tracking above the uterine fundus at the pelvic inlet (series 3, image 66). Other: Ventral abdominal wound healing by secondary intention. Musculoskeletal: No acute osseous abnormality identified. IMPRESSION: 1. Large new hemorrhage/hematoma since yesterday in the right peritoneal cavity. Estimated blood volume of at least 800 mL. Hypovolemic appearance of the IVC. Hematoma mass effect on the proximal duodenum and the right ureter. 2. Numerous bilateral renal infarcts, substantially progressed from the  CTA yesterday. New right hydronephrosis likely due to the compression of the ureter. 3. Increased small volume fluid collection ventral to the uterus with simple fluid density and some associated foci of gas. 4. New small foci of extraluminal gas along the caudal aspect of the hematoma near the uterine fundus. 5. Dilated colostomy loop, and dilated stomach containing air in fluid. Although no other dilated bowel loops. 6. Superimposed small volume of simple ascites in the abdomen and pelvis. Continued moderate to large layering pleural effusions. Electronically Signed: By: Genevie Ann M.D. On: 10/30/2019 22:47    Anti-infectives: Anti-infectives (From admission, onward)   Start     Dose/Rate Route Frequency Ordered Stop   10/31/19 1700  vancomycin (VANCOCIN) IVPB 1000 mg/200 mL premix     Discontinue     1,000 mg 200 mL/hr over 60 Minutes Intravenous Every 12 hours 10/31/19 0742     10/31/19 1000  vancomycin (VANCOREADY) IVPB 750 mg/150 mL  Status:  Discontinued        750 mg 150 mL/hr over 60 Minutes Intravenous Every 12 hours 10/31/19 0057 10/31/19 0742   10/31/19 0900  meropenem (MERREM) 2 g in sodium chloride 0.9 % 100 mL IVPB     Discontinue     2 g 200 mL/hr over 30 Minutes Intravenous Every 8 hours 10/31/19 0742     10/31/19 0200  vancomycin (VANCOREADY) IVPB 1500 mg/300 mL        1,500 mg 150 mL/hr over 120 Minutes Intravenous  Once 10/31/19 0057 10/31/19 0700   10/31/19 0200  piperacillin-tazobactam (ZOSYN) IVPB 3.375 g  Status:  Discontinued        3.375 g 12.5 mL/hr over 240 Minutes Intravenous Every 8 hours 10/31/19 0057 10/31/19 0726   10/24/19 0600  piperacillin-tazobactam (ZOSYN) IVPB 3.375 g  Status:  Discontinued        3.375 g 12.5 mL/hr over 240 Minutes Intravenous Every 8 hours 10/23/19 2305 10/29/19 0932   10/24/19 0000  piperacillin-tazobactam (ZOSYN) IVPB 4.5 g  Status:  Discontinued        4.5 g 200 mL/hr over 30 Minutes Intravenous Every 6 hours 10/23/19 2257 10/23/19  2302   10/23/19 2315  piperacillin-tazobactam (ZOSYN) IVPB 3.375 g        3.375 g 100 mL/hr over 30 Minutes Intravenous STAT 10/23/19 2302 10/23/19 2347       Assessment/Plan 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 Henrene Pastor, per GI, started Solumedrol 7/27 Sigmoid colon perforation - S/P partial colectomy and end colostomy by Dr. Kae Heller 7/24. POD#6.  -now with large mesenteric hematoma. Leave alone, should tamponade for now.  See Dr. Pascal Lux note with IR for full explanation.  Agree with his assessment. -TNA -BID WD dressing changes.  Some fascial separation.  Continue to monitor -cont JP drain and monitor output -cont q 6h CBC -can likely have some clears, but monitor for ileus given new hematoma -WBC 17 today.  No evidence of infection in abdomen on scan Sepsis - per primary team, off pressors  Acute hypoxic respiratory failure  Protein calorie malnutrition  Acute on chronic anemia - receiving 1 u pRBC today 7/20  FEN: TNA, NPO currently ID: Zosyn 7/23-7/29   VTE: SCD's, hep gtt on hold  Foley: removed 7/26, voiding spontaneously  Dispo: ICU   LOS: 12 days    Natasha Barrera , [facility] 10/31/2019, 12:35 PM Please see Amion for pager number during day hours 7:00am-4:30pm or 7:00am -11:30am on weekends

## 2019-10-31 NOTE — Progress Notes (Signed)
Spoke with husband and provided updates about pt transfer to the ICU.

## 2019-10-31 NOTE — Progress Notes (Addendum)
Subjective:  Natasha Barrera is a 54 y.o. F with PMH of HTN, Crohn's Dz, Diverticulosis admit for bowel perforation on hospital day 43  Natasha Barrera was examined and evaluated at bedside this am. She was observed resting comfortably in bed this am. She is AAOx2 to name and location although she mentions also being at her friends house. Believes date is in November. She has no acute complaint at this time. Denies any abdominal pain, nausea, vomiting.  Objective:  Vital signs in last 24 hours: Vitals:   10/31/19 1030 10/31/19 1045 10/31/19 1100 10/31/19 1115  BP: 100/70 (!) 95/64 98/67 (!) 101/63  Pulse:  92 94 90  Resp: 16 15 15 16   Temp:      TempSrc:      SpO2:  92% 93% 93%  Weight:      Height:       Gen: Chronically ill-appearing, NAD HEENT: NCAT head, hearing intact, EOMI, MMM Neck: supple, ROM intact CV: RRR, S1, S2 normal, No rubs, no murmurs, no gallops Pulm: Anteriorly auscultated, CTAB, No rales, no wheezes,  Abd: exquisite LLQ tenderness to palpation, Unable to auscultate bowel sounds, Open abdominal surgical site with packaging in place, ostomy bag with dark red fluid, ostomy site with surrounding fluids, abdominal drain w/ sanguinous fluid Extm: ROM intact, Peripheral pulses intact, trace edema, Skin: Pale Neuro: AAOx2 Psych: Normal mood and affect  Assessment/Plan:  Principal Problem:   Crohn's disease of large intestine with other complication (HCC) Active Problems:   Colitis   Hyponatremia   Malnutrition of moderate degree (HCC)   Hypotension due to hypovolemia   Anemia   Weight loss, unintentional   Esophageal ring   Hypoalbuminemia due to protein-calorie malnutrition (HCC)   Pressure injury of skin  Natasha Barrera is a 54 yo F w/ PMH of HTN, Crohn's Dz, Diverticulosis admit for Crohn's Flare with sigmoid colon perforation  Right peritoneal hematoma Hemorrhagic Shock Overnight developed worsening mentation, hypotension, tachycardia. Repeat CT abd showing  large worsening hematoma. Hgb of 6.2. Received 2 units pRBCs and fluid boluses and broad spectrum antibiotics. hgb 6.2->10.4->9.4. Lactate 3.8->1.7 Wbc 17.6->20.9. IR consulted but not appropriate for drain. Surgery not recommending surgery. Possibly due to repeat perforation vs oozing from heparin. Heparin d/ced overnight. - Appreciate IR recs: No drain, recommend further work-up for emboli for renal infarcts - Cbc q4 hr - Transfuse if hgb <7 - Keep NPO - S/p 2L bolus, C/w maintenance fluids - Stop vanc/zosyn to preserve renal fx, can replace zosyn w/ meropenem - F/u cultures  - May need urology consult if urine output drops due to compression from large hematoma  Crohn's Flare  Sigmoid Colon perforation Present w/ sigmoid colon perf requiring colectomy on 10/24/19. Continuing to have bloody drainage. Large hematoma formation concerning for repeat perforation, especially w/ worsening Hetzer count. On-call surgery team paged last night but advised to monitor for now. - Appreciate surgery recs - C/w broad spectrum antibiotics - C/w solumedrol 30m BID  Renal Infarcts Abdominal aortic thrombus Worsening progression of renal infarct. Had known infrarenal aortic thrombus with possible additional embolic event into renal arteries but unable to anti-coagulate due to worsening hematoma.  - Monitor renal fx - Holding anti-coagulation due to bleed  DVT prophx: SCDs Diet: NPO Bowel: N/A Code: Full  Prior to Admission Living Arrangement:  Home Anticipated Discharge Location: SNF Barriers to Discharge: Medical tx Dispo: Anticipated discharge in approximately 3-4 days   LMosetta Anis MD 10/31/2019, 11:54 AM Pager: [phone] 5pm  on weekdays and 1pm on weekends: On Call Pager: [phone]

## 2019-10-31 NOTE — Significant Event (Signed)
Rapid Response Event Note  Reason for Call : Called d/t confusion, tachycardia, and hypotension. Pt just returned from CT of abdomen showing large hemorrhage/hematoma in R peritoneal cavity approx 800cc with mass effect on the prox. Duodenum and R ureter.   Initial Focused Assessment:  Pt laying in bed with eyes closed. Pt is lethargic, but will awaken to verbal command-is alert to person and place, will follow commands, and move all extremities. Pt c/o abd pain/cramping. Lungs clear, diminished in bases. Skin is cool and pale with mottling noted to BLE. Mid-line abd incision has dark bloody drainage as well as ostomy. JP drain with serosang drainage.  T-97.4, HR-114, BP-79/54, RR-23, SpO2-96% on RA.    Interventions: Heparin gtt d/c'd CBC-hgb 8.3>6.2 1L LR 2 units PRBCs  CCS notified-recommended IR consult IR consulted for possible hematoma drainage Tx to ICU for closer monitoring   Plan of Care: Tx to ICU.    Event Summary:  MD Notified: MDs at bedside on my arrival, CCS/ir notified by PCP. Call Time: [place] Time:  Dillard Essex, RN

## 2019-10-31 NOTE — Progress Notes (Signed)
  Echocardiogram 2D Echocardiogram has been performed.  Natasha Barrera 10/31/2019, 3:26 PM

## 2019-10-31 NOTE — Progress Notes (Signed)
Attempted to call husband x 1. Left voicemail with short update (with Mrs. Fix's permission), as well as a call back number to the floor.

## 2019-11-01 LAB — GLUCOSE, CAPILLARY
Glucose-Capillary: 112 mg/dL — ABNORMAL HIGH (ref 70–99)
Glucose-Capillary: 129 mg/dL — ABNORMAL HIGH (ref 70–99)
Glucose-Capillary: 104 mg/dL — ABNORMAL HIGH (ref 70–99)
Glucose-Capillary: 104 mg/dL — ABNORMAL HIGH (ref 70–99)
Glucose-Capillary: 92 mg/dL (ref 70–99)
Glucose-Capillary: 117 mg/dL — ABNORMAL HIGH (ref 70–99)

## 2019-11-01 LAB — CBC
HCT: 21 % — ABNORMAL LOW (ref 36.0–46.0)
HCT: 24.6 % — ABNORMAL LOW (ref 36.0–46.0)
HCT: 25.6 % — ABNORMAL LOW (ref 36.0–46.0)
Hemoglobin: 7 g/dL — ABNORMAL LOW (ref 12.0–15.0)
Hemoglobin: 8.2 g/dL — ABNORMAL LOW (ref 12.0–15.0)
Hemoglobin: 8.4 g/dL — ABNORMAL LOW (ref 12.0–15.0)
MCH: 31.5 pg (ref 26.0–34.0)
MCH: 31.7 pg (ref 26.0–34.0)
MCH: 30.8 pg (ref 26.0–34.0)
MCHC: 33.3 g/dL (ref 30.0–36.0)
MCHC: 33.3 g/dL (ref 30.0–36.0)
MCHC: 32.8 g/dL (ref 30.0–36.0)
MCV: 94.6 fL (ref 80.0–100.0)
MCV: 95 fL (ref 80.0–100.0)
MCV: 93.8 fL (ref 80.0–100.0)
Platelets: 209 10*3/uL (ref 150–400)
Platelets: 191 10*3/uL (ref 150–400)
Platelets: 186 10*3/uL (ref 150–400)
RBC: 2.22 MIL/uL — ABNORMAL LOW (ref 3.87–5.11)
RBC: 2.59 MIL/uL — ABNORMAL LOW (ref 3.87–5.11)
RBC: 2.73 MIL/uL — ABNORMAL LOW (ref 3.87–5.11)
RDW: 19.9 % — ABNORMAL HIGH (ref 11.5–15.5)
RDW: 18.7 % — ABNORMAL HIGH (ref 11.5–15.5)
RDW: 18.7 % — ABNORMAL HIGH (ref 11.5–15.5)
WBC: 15 10*3/uL — ABNORMAL HIGH (ref 4.0–10.5)
WBC: 13.4 10*3/uL — ABNORMAL HIGH (ref 4.0–10.5)
WBC: 13.3 10*3/uL — ABNORMAL HIGH (ref 4.0–10.5)
nRBC: 0.6 % — ABNORMAL HIGH (ref 0.0–0.2)
nRBC: 0.4 % — ABNORMAL HIGH (ref 0.0–0.2)
nRBC: 0.5 % — ABNORMAL HIGH (ref 0.0–0.2)

## 2019-11-01 LAB — BASIC METABOLIC PANEL
Anion gap: 6 (ref 5–15)
BUN: 12 mg/dL (ref 6–20)
CO2: 25 mmol/L (ref 22–32)
Calcium: 7.5 mg/dL — ABNORMAL LOW (ref 8.9–10.3)
Chloride: 104 mmol/L (ref 98–111)
Creatinine, Ser: 0.3 mg/dL — ABNORMAL LOW (ref 0.44–1.00)
GFR calc Af Amer: >60 mL/min (ref >60)
GFR calc non Af Amer: >60 mL/min (ref >60)
Glucose, Bld: 93 mg/dL (ref 70–99)
Potassium: 3.8 mmol/L (ref 3.5–5.1)
Sodium: 135 mmol/L (ref 135–145)

## 2019-11-01 LAB — THIOPURINE METHYLTRANSFERASE (TPMT), RBC: TPMT Activity:: 24.8 Units/mL RBC

## 2019-11-01 LAB — PHOSPHORUS: Phosphorus: 3 mg/dL (ref 2.5–4.6)

## 2019-11-01 MED ORDER — TRAVASOL 10 % IV SOLN
INTRAVENOUS | Status: AC
  Filled 2019-11-01: qty 900

## 2019-11-01 MED ORDER — DEXTROSE 10 % IV SOLN: INTRAVENOUS | Status: DC

## 2019-11-01 MED ORDER — INSULIN ASPART 100 UNIT/ML ~~LOC~~ SOLN
0.0000 [IU] | SUBCUTANEOUS | Status: DC
  Administered 2019-11-01 – 2019-11-06 (×12): 1 [IU] via SUBCUTANEOUS

## 2019-11-01 MED ORDER — SODIUM CHLORIDE 0.9% IV SOLUTION: Freq: Once | INTRAVENOUS | Status: AC

## 2019-11-01 MED ORDER — VITAMIN A 3 MG (10000 UNIT) PO CAPS
10000.0000 [IU] | ORAL_CAPSULE | Freq: Every day | ORAL | Status: DC
  Administered 2019-11-02 – 2019-11-23 (×18): 10000 [IU] via ORAL
  Filled 2019-11-01 (×23): qty 1

## 2019-11-01 NOTE — Progress Notes (Signed)
Had prolonged conversation with Ms.Headrick's daughter and husband at bedside. Family mentions significant distress with current hospital course, especially Mr.Voeltz expressing blame to physical therapy as cause of her hematoma formation. Discussed in detail regarding known risk of bleed with heparin. Mr.Dopson requests that physical therapy not come by until her bleeding has resolved and Mrs.Holian agrees with him. Discussed in detail regarding benefits of early mobility for progression but family continued to decline. Also discussed current clinical course and likelihood that her bleed is resolving based on post-transfusion cbc. Family expressed understanding. All other questions and concerns addressed.

## 2019-11-01 NOTE — Progress Notes (Addendum)
Subjective:  Natasha Barrera is a 54 y.o. F with PMH of HTN, Crohn's Dz, Diverticulosis admit for bowel perforation on hospital day 57  Natasha Barrera was examined and evaluated at bedside this am. Natasha Barrera was observed resting comfortably in bed this am and mentions having no significant abdominal pain. Natasha Barrera mentions feeling well without significant fatigue, dyspnea, chest pain. States Natasha Barrera has had good night of sleep. AAOx2 to name and location. Denies any fevers, chills, nausea, vomiting.  Objective:  Vital signs in last 24 hours: Vitals:   11/01/19 0700 11/01/19 0800 11/01/19 0808 11/01/19 0830  BP: (!) 107/63 (!) 99/61  (!) 100/62  Pulse: 73 82 90 78  Resp: 12 14 18 15   Temp:   98.1 F (36.7 C) 98.1 F (36.7 C)  TempSrc:   Oral Oral  SpO2: 93% 94% 95% 94%  Weight:      Height:       Physical Exam Constitutional:      Appearance: Natasha Barrera is ill-appearing (chronically ill-appearing).  HENT:     Mouth/Throat:     Mouth: Mucous membranes are dry.     Pharynx: Oropharynx is clear.  Eyes:     Conjunctiva/sclera: Conjunctivae normal.  Cardiovascular:     Rate and Rhythm: Normal rate and regular rhythm.     Pulses: Normal pulses.     Heart sounds: No murmur heard.   Pulmonary:     Effort: Pulmonary effort is normal.     Breath sounds: Rales (trace bibasilar rales) present. No wheezing.  Abdominal:     General: There is distension (open abdominal surgical site w/ packing).     Tenderness: There is abdominal tenderness (LLQ).     Comments: Ostomy bag w/ formed dark brown stools, abdominal drain w/ sanguinous fluid.  Musculoskeletal:        General: Swelling (2+ up to mid shin) present. Normal range of motion.  Skin:    General: Skin is warm.     Coloration: Skin is pale.  Neurological:     General: No focal deficit present.     Mental Status: Natasha Barrera is alert.     Comments: AAOx2    Assessment/Plan:  Principal Problem:   Colon perforation (HCC) Active Problems:   Crohn's disease  of large intestine with other complication (HCC)   Malnutrition of moderate degree (HCC)   Pressure injury of skin   Peritoneal hematoma   Renal infarction ([place])   Acute blood loss anemia  Natasha Barrera is a 54 yo F w/ PMH of HTN, Crohn's Dz, Diverticulosis admit for Crohn's Flare with sigmoid colon perforation  Right peritoneal hematoma Hemorrhagic Shock Had continued decline in decline after transfusion although has slowed down. Likely due to continued oozing. No intervention per surgery/IR. hgb 10.4->9.4->8.8->7.8->7.0.  Leukocytosis improving Wbc 20.9->19.5->15. Heparin d/c for 36 hours now. Hypervolemic on exam. BP stable at 107/63. Stable urinary output at 1L daily (imaging suggest compression of ureter from hematoma) - Appreciate surgery recs: Continue to monitor - 1 unit PRBC - F/u post-transfusion hgb - Transfuse if hgb <7 - Keep NPO - Hold maintenance fluid - C/w vanc/meropenem (d/c if culture day 1 result negative - F/u cultures  - May need urology consult if urine output drops due to compression from large hematoma   Crohn's Flare  Sigmoid Colon perforation Present w/ sigmoid colon perf requiring colectomy on 10/24/19. Ostomy showing more formed stools. - Appreciate surgery recs - C/w broad spectrum antibiotics - C/w solumedrol 70m BID  Renal Infarcts Abdominal  aortic thrombus Worsening progression of renal infarcts with distribution concerning for embolic event. CT chest w/o significant findings. TTE w/o mitral valve patholoy although unable to capture the aortic valve. Unclear etiology for the renal infarcts but unable to start anti-coagulation due to hematoma. Renal fx unchanged - Monitor renal fx - Holding anti-coagulation due to bleed  Severe protein calorie malnutrition Albumin 1.5. On TPN due to GI complications. - TPN per RD  DVT prophx: SCDs Diet: NPO Bowel: N/A Code: Full  Prior to Admission Living Arrangement:  Home Anticipated Discharge Location:  SNF Barriers to Discharge: Medical tx Dispo: Anticipated discharge in approximately 3-4 days   Mosetta Anis, MD 11/01/2019, 8:56 AM Pager: [phone] After 5pm on weekdays and 1pm on weekends: On Call Pager: [phone]

## 2019-11-01 NOTE — Progress Notes (Signed)
PHARMACY - TOTAL PARENTERAL NUTRITION CONSULT NOTE   Indication: Prolonged ileus  Patient Measurements: Height: 5' 1"  (154.9 cm) Weight: 77 kg (169 lb 12.1 oz) IBW/kg (Calculated) : 47.8 TPN AdjBW (KG): 55.1 Body mass index is 32.07 kg/m.  Assessment:  89 yof with remote history of sigmoid colectomy and recent admission d/t colitis initially presented with lower extremity edema and later developed abdominal pain with decreased appetite. S/p EGD + colonoscopy on 7/21 that showed Crohn's colitis. On 7/23 a CT showed large amount pneumoperitoneum and patients underwent exlap with partial colectomy and colostomy. To start TPN for anticipated prolonged ileus.   On 7/30 repeat CT abdomen was obtained and found new large hematoma in R peritoneal cavity and patient was transferred back to the ICU for close monitoring. IR consulted for possible drainage of hematoma though not a candidate for drainage. Not requiring pressors at this time. Vancomycin and meropenem were started for concern of intra-abdominal infection.   Glucose / Insulin: CBGs ranging from 95-134, mainly 110s after starting TPN containing insulin. Received 5 units of rSSI in 24 hours. Continues on solumedrol 67m q12h. Electrolytes: Na 135. Other electrolytes wnl.  Renal: SCr stable. UOP 0.5 ml/kg/hr. LFTs / TGs: LFTs WNL. T bili 1.1 - increase. TG 271 on 7/26.  Prealbumin / albumin: prealbumin <5, albumin 1.5 Intake / Output; MIVF: 370 mL / 24 hrs drain output, 400 mL / 24 hrs colostomy output. Off MIVF GI Imaging: 7/23 Abd xray - ileus vs SBO 7/23 CT abd - Extensive pneumoperitoneum, most consistent with colonic perforation given the persistent diffuse colitis 7/29 CT abd - scattered small fluid collections, bowel inflammation 7/30 CT abd - R peritoneal cavity hematoma (at least 8027m, dilated colostomy loop and stomach containing air   Surgeries / Procedures:  7/21 Colonoscopy/EGD 7/24 Exlap, partial colectomy and  colostomy  Central access: PICC placed 7/25 TPN start date: 7/25  Nutritional Goals (per RD recommendation on 7/29): kCal: 1700-1900, Protein: 90-105g, Fluid: >1.7L  Goal TPN rate is 75 mL/hr (provides 90 g of protein and 1849 kcals per day)  Current Nutrition:  Soft diet transitioned back to NPO on 7/31  Ensure twice daily - on hold  Boost three times daily - on hold TPN  Plan:  Continue TPN at goal rate of 7575mr at 1800 - provides 90 g of protein and 1849 kcals per day (100% goal calories)   Electrolytes in TPN (no adjustments today): Continue 150 mEq/L of Na, 71m59m of K, 5mEq19mof Ca, 5mEq/76mf Mg, and 15mmol66mf Phos. Cl:Ac ratio 1:1 Continue 10 units of insulin in TPN  Adjust SSI to sensitive SSI q4h and adjust as needed Add standard MVI and trace elements to TPN and monitor tbili closely Monitor TPN labs on Mon/Thurs   Kadance Mccuistion BCristela FeltD Clinical Pharmacist

## 2019-11-01 NOTE — Plan of Care (Signed)
  Problem: Education: Goal: Knowledge of General Education information will improve Description: Including pain rating scale, medication(s)/side effects and non-pharmacologic comfort measures Outcome: Progressing   Problem: Clinical Measurements: Goal: Diagnostic test results will improve Outcome: Progressing Goal: Respiratory complications will improve Outcome: Progressing   Problem: Nutrition: Goal: Adequate nutrition will be maintained Outcome: Progressing   Problem: Coping: Goal: Level of anxiety will decrease Outcome: Progressing   Problem: Pain Managment: Goal: General experience of comfort will improve Outcome: Progressing

## 2019-11-01 NOTE — Progress Notes (Signed)
General Surgery Follow Up Note  Subjective:    Overnight Issues:   Objective:  Vital signs for last 24 hours: Temp:  [98 F (36.7 C)-98.6 F (37 C)] 98.2 F (36.8 C) (08/01 1200) Pulse Rate:  [72-124] 72 (08/01 1300) Resp:  [12-28] 15 (08/01 1300) BP: (81-115)/(47-73) 113/70 (08/01 1300) SpO2:  [90 %-95 %] 93 % (08/01 1300)  Hemodynamic parameters for last 24 hours:    Intake/Output from previous day: 07/31 0701 - 08/01 0700 In: 3818.1 [I.V.:3301.9; IV Piggyback:516.3] Out: 1730 [Urine:960; Drains:370; Stool:400]  Intake/Output this shift: Total I/O In: 785.1 [I.V.:278.6; Blood:306.5; IV Piggyback:200] Out: 515 [Urine:465; Drains:50]  Vent settings for last 24 hours:    Physical Exam:  Gen: comfortable, no distress Neuro: non-focal exam HEENT: PERRL Neck: supple CV: RRR Pulm: unlabored breathing Abd: soft, appropriately TTP, midline wound with healthy granulation tissue, JP with SS o/p, ostomy productive GU: clear yellow urine Extr: wwp, no edema   Results for orders placed or performed during the hospital encounter of 10/18/19 (from the past 24 hour(s))  Glucose, capillary     Status: None   Collection Time: 10/31/19  4:26 PM  Result Value Ref Range   Glucose-Capillary 95 70 - 99 mg/dL  CBC     Status: Abnormal   Collection Time: 10/31/19  5:21 PM  Result Value Ref Range   WBC 19.3 (H) 4.0 - 10.5 K/uL   RBC 2.54 (L) 3.87 - 5.11 MIL/uL   Hemoglobin 7.8 (L) 12.0 - 15.0 g/dL   HCT 23.6 (L) 36 - 46 %   MCV 92.9 80.0 - 100.0 fL   MCH 30.7 26.0 - 34.0 pg   MCHC 33.1 30.0 - 36.0 g/dL   RDW 19.5 (H) 11.5 - 15.5 %   Platelets 207 150 - 400 K/uL   nRBC 0.7 (H) 0.0 - 0.2 %  Protime-INR     Status: None   Collection Time: 10/31/19  5:21 PM  Result Value Ref Range   Prothrombin Time 14.8 11.4 - 15.2 seconds   INR 1.2 0.8 - 1.2  Glucose, capillary     Status: Abnormal   Collection Time: 10/31/19  7:23 PM  Result Value Ref Range   Glucose-Capillary 119 (H)  70 - 99 mg/dL  CBC     Status: Abnormal   Collection Time: 10/31/19  8:48 PM  Result Value Ref Range   WBC 19.5 (H) 4.0 - 10.5 K/uL   RBC 2.49 (L) 3.87 - 5.11 MIL/uL   Hemoglobin 7.7 (L) 12.0 - 15.0 g/dL   HCT 23.2 (L) 36 - 46 %   MCV 93.2 80.0 - 100.0 fL   MCH 30.9 26.0 - 34.0 pg   MCHC 33.2 30.0 - 36.0 g/dL   RDW 19.9 (H) 11.5 - 15.5 %   Platelets 215 150 - 400 K/uL   nRBC 0.5 (H) 0.0 - 0.2 %  Glucose, capillary     Status: Abnormal   Collection Time: 10/31/19 11:13 PM  Result Value Ref Range   Glucose-Capillary 115 (H) 70 - 99 mg/dL  Glucose, capillary     Status: Abnormal   Collection Time: 11/01/19  3:16 AM  Result Value Ref Range   Glucose-Capillary 112 (H) 70 - 99 mg/dL  CBC     Status: Abnormal   Collection Time: 11/01/19  6:17 AM  Result Value Ref Range   WBC 15.0 (H) 4.0 - 10.5 K/uL   RBC 2.22 (L) 3.87 - 5.11 MIL/uL   Hemoglobin 7.0 (L)  12.0 - 15.0 g/dL   HCT 21.0 (L) 36 - 46 %   MCV 94.6 80.0 - 100.0 fL   MCH 31.5 26.0 - 34.0 pg   MCHC 33.3 30.0 - 36.0 g/dL   RDW 19.9 (H) 11.5 - 15.5 %   Platelets 209 150 - 400 K/uL   nRBC 0.6 (H) 0.0 - 0.2 %  Basic metabolic panel     Status: Abnormal   Collection Time: 11/01/19  6:17 AM  Result Value Ref Range   Sodium 135 135 - 145 mmol/L   Potassium 3.8 3.5 - 5.1 mmol/L   Chloride 104 98 - 111 mmol/L   CO2 25 22 - 32 mmol/L   Glucose, Bld 93 70 - 99 mg/dL   BUN 12 6 - 20 mg/dL   Creatinine, Ser 0.30 (L) 0.44 - 1.00 mg/dL   Calcium 7.5 (L) 8.9 - 10.3 mg/dL   GFR calc non Af Amer >60 >60 mL/min   GFR calc Af Amer >60 >60 mL/min   Anion gap 6 5 - 15  Phosphorus     Status: None   Collection Time: 11/01/19  6:17 AM  Result Value Ref Range   Phosphorus 3.0 2.5 - 4.6 mg/dL  Glucose, capillary     Status: Abnormal   Collection Time: 11/01/19  7:30 AM  Result Value Ref Range   Glucose-Capillary 129 (H) 70 - 99 mg/dL  Glucose, capillary     Status: Abnormal   Collection Time: 11/01/19 11:29 AM  Result Value Ref Range     Glucose-Capillary 104 (H) 70 - 99 mg/dL  CBC     Status: Abnormal   Collection Time: 11/01/19 12:12 PM  Result Value Ref Range   WBC 13.4 (H) 4.0 - 10.5 K/uL   RBC 2.59 (L) 3.87 - 5.11 MIL/uL   Hemoglobin 8.2 (L) 12.0 - 15.0 g/dL   HCT 24.6 (L) 36 - 46 %   MCV 95.0 80.0 - 100.0 fL   MCH 31.7 26.0 - 34.0 pg   MCHC 33.3 30.0 - 36.0 g/dL   RDW 18.7 (H) 11.5 - 15.5 %   Platelets 191 150 - 400 K/uL   nRBC 0.4 (H) 0.0 - 0.2 %    Assessment & Plan: The plan of care was discussed with the bedside nurse for the day, who is in agreement with this plan and no additional concerns were raised.   Present on Admission: . (Resolved) Hyponatremia . Crohn's disease of large intestine with other complication ([place]) . Malnutrition of moderate degree ([place]) . Pressure injury of skin . Peritoneal hematoma . 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   and I reviewed the patients new imaging test results.    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 Kae Heller 7/24. POD#7.  - now with large mesenteric hematoma. Leave alone, should tamponade.  See Dr. Pascal Lux note with IR for full explanation.  Agree with his assessment. -TNA, start clears, although may have an ileus/early satiety due to heamtoma - BID WD dressing changes.  Some fascial separation.  Continue to monitor -cont JP drain and monitor output -cont q 6h CBC -WBC downtrending Sepsis- per primary team, off pressors  Acute hypoxic respiratory failure Protein calorie malnutrition  Acute on chronic anemia- received 1 u pRBC this AM with appropriate response Volume overload - would recommend diuresis with at least 40 of lasix today FEN: TNA, NPO  currently ID: [id number] VTE: SCD's, hep gtt on hold  Foley: continue for accurate I/O Dispo: ICU   Jesusita Oka, MD Trauma & General Surgery Please use [email or web address] to contact on call provider  11/01/2019  *Care during the described time interval was provided by me. I have reviewed this patient's available data, including medical history, events of note, physical examination and test results as part of my evaluation.

## 2019-11-01 NOTE — Plan of Care (Signed)
  Problem: Pain Managment: Goal: General experience of comfort will improve Outcome: Progressing   

## 2019-11-01 NOTE — Progress Notes (Signed)
Pt has been receiving TNA through her PICC line;  TNA tubing has been found to be defective tonight;   Per Lattie Haw in Pharmacy, we are to hang D10 at the same rate the TNA would have infused;  Have spoken with the patient's nurse who will hang D10 at 75/hr.  Pharmacy to investigate TNA tubing and filters on Monday;

## 2019-11-02 ENCOUNTER — Other Ambulatory Visit: Payer: Self-pay

## 2019-11-02 DIAGNOSIS — I7410 Embolism and thrombosis of unspecified parts of aorta: Secondary | ICD-10-CM

## 2019-11-02 DIAGNOSIS — K631 Perforation of intestine (nontraumatic): Secondary | ICD-10-CM

## 2019-11-02 LAB — DIFFERENTIAL
Abs Immature Granulocytes: 0.17 10*3/uL — ABNORMAL HIGH (ref 0.00–0.07)
Basophils Absolute: 0 10*3/uL (ref 0.0–0.1)
Basophils Relative: 0 %
Eosinophils Absolute: 0 10*3/uL (ref 0.0–0.5)
Eosinophils Relative: 0 %
Immature Granulocytes: 1 %
Lymphocytes Relative: 9 %
Lymphs Abs: 1.1 10*3/uL (ref 0.7–4.0)
Monocytes Absolute: 0.5 10*3/uL (ref 0.1–1.0)
Monocytes Relative: 4 %
Neutro Abs: 10.6 10*3/uL — ABNORMAL HIGH (ref 1.7–7.7)
Neutrophils Relative %: 86 %

## 2019-11-02 LAB — PREPARE RBC (CROSSMATCH)

## 2019-11-02 LAB — CBC
HCT: 28.2 % — ABNORMAL LOW (ref 36.0–46.0)
Hemoglobin: 9.2 g/dL — ABNORMAL LOW (ref 12.0–15.0)
MCH: 30.9 pg (ref 26.0–34.0)
MCHC: 32.6 g/dL (ref 30.0–36.0)
MCV: 94.6 fL (ref 80.0–100.0)
Platelets: 209 10*3/uL (ref 150–400)
RBC: 2.98 MIL/uL — ABNORMAL LOW (ref 3.87–5.11)
RDW: 19.1 % — ABNORMAL HIGH (ref 11.5–15.5)
WBC: 12.4 10*3/uL — ABNORMAL HIGH (ref 4.0–10.5)
nRBC: 0.3 % — ABNORMAL HIGH (ref 0.0–0.2)

## 2019-11-02 LAB — COMPREHENSIVE METABOLIC PANEL
ALT: 135 U/L — ABNORMAL HIGH (ref 0–44)
AST: 88 U/L — ABNORMAL HIGH (ref 15–41)
Albumin: 1.4 g/dL — ABNORMAL LOW (ref 3.5–5.0)
Alkaline Phosphatase: 150 U/L — ABNORMAL HIGH (ref 38–126)
Anion gap: 6 (ref 5–15)
BUN: 10 mg/dL (ref 6–20)
CO2: 24 mmol/L (ref 22–32)
Calcium: 7.5 mg/dL — ABNORMAL LOW (ref 8.9–10.3)
Chloride: 101 mmol/L (ref 98–111)
Creatinine, Ser: 0.32 mg/dL — ABNORMAL LOW (ref 0.44–1.00)
GFR calc Af Amer: >60 mL/min (ref >60)
GFR calc non Af Amer: >60 mL/min (ref >60)
Glucose, Bld: 102 mg/dL — ABNORMAL HIGH (ref 70–99)
Potassium: 3.6 mmol/L (ref 3.5–5.1)
Sodium: 131 mmol/L — ABNORMAL LOW (ref 135–145)
Total Bilirubin: 0.7 mg/dL (ref 0.3–1.2)
Total Protein: 4.3 g/dL — ABNORMAL LOW (ref 6.5–8.1)

## 2019-11-02 LAB — PREALBUMIN: Prealbumin: 14.2 mg/dL — ABNORMAL LOW (ref 18–38)

## 2019-11-02 LAB — TRIGLYCERIDES: Triglycerides: 99 mg/dL (ref <150)

## 2019-11-02 LAB — GLUCOSE, CAPILLARY
Glucose-Capillary: 111 mg/dL — ABNORMAL HIGH (ref 70–99)
Glucose-Capillary: 111 mg/dL — ABNORMAL HIGH (ref 70–99)
Glucose-Capillary: 117 mg/dL — ABNORMAL HIGH (ref 70–99)
Glucose-Capillary: 104 mg/dL — ABNORMAL HIGH (ref 70–99)
Glucose-Capillary: 130 mg/dL — ABNORMAL HIGH (ref 70–99)

## 2019-11-02 LAB — MAGNESIUM: Magnesium: 2.3 mg/dL (ref 1.7–2.4)

## 2019-11-02 LAB — PHOSPHORUS: Phosphorus: 3.7 mg/dL (ref 2.5–4.6)

## 2019-11-02 MED ORDER — ENSURE ENLIVE PO LIQD
237.0000 mL | Freq: Two times a day (BID) | ORAL | Status: DC
  Administered 2019-11-02: 237 mL via ORAL

## 2019-11-02 MED ORDER — PANTOPRAZOLE SODIUM 40 MG PO TBEC
40.0000 mg | DELAYED_RELEASE_TABLET | Freq: Every day | ORAL | Status: DC
  Administered 2019-11-03 – 2019-11-23 (×20): 40 mg via ORAL
  Filled 2019-11-02 (×20): qty 1

## 2019-11-02 MED ORDER — TRACE MINERALS CU-MN-SE-ZN 300-55-60-3000 MCG/ML IV SOLN
INTRAVENOUS | Status: AC
  Filled 2019-11-02: qty 604.8

## 2019-11-02 MED ORDER — FUROSEMIDE 10 MG/ML IJ SOLN
40.0000 mg | Freq: Once | INTRAMUSCULAR | Status: AC
  Administered 2019-11-02: 40 mg via INTRAVENOUS
  Filled 2019-11-02: qty 4

## 2019-11-02 MED ORDER — ENSURE ENLIVE PO LIQD
237.0000 mL | Freq: Three times a day (TID) | ORAL | Status: DC
  Administered 2019-11-03 – 2019-11-16 (×37): 237 mL via ORAL
  Filled 2019-11-02 (×4): qty 237

## 2019-11-02 MED ORDER — HYDROCODONE-ACETAMINOPHEN 5-325 MG PO TABS
1.0000 | ORAL_TABLET | ORAL | Status: DC | PRN
  Administered 2019-11-02 – 2019-11-03 (×2): 1 via ORAL
  Filled 2019-11-02 (×3): qty 1

## 2019-11-02 NOTE — Progress Notes (Signed)
General Surgery Follow Up Note  Subjective:    Overnight Issues:  NAEO Tolerating CLD, drinking small amts at a time. Having ostomy output. Husband at bedside states that he thinks mobilizing with PT caused the hematoma.   Hgb/hct 9.2/28.2 from 8.4/25.6, relatively stable  Objective:  Vital signs for last 24 hours: Temp:  [98.1 F (36.7 C)-98.7 F (37.1 C)] 98.7 F (37.1 C) (08/02 0800) Pulse Rate:  [60-108] 65 (08/02 0600) Resp:  [10-26] 11 (08/02 0600) BP: (98-127)/(52-71) 110/52 (08/02 0600) SpO2:  [91 %-95 %] 94 % (08/02 0600)  Hemodynamic parameters for last 24 hours:    Intake/Output from previous day: 08/01 0701 - 08/02 0700 In: 2020.6 [I.V.:1514.1; Blood:306.5; IV Piggyback:200] Out: 1585 [Urine:1215; Drains:270; Stool:100]  Intake/Output this shift: No intake/output data recorded.  Vent settings for last 24 hours:    Physical Exam:  Gen: comfortable, no distress Neuro: non-focal exam HEENT: PERRL Neck: supple CV: RRR Pulm: unlabored breathing Abd: soft, appropriately TTP, midline wound dressing c/d/i, JP with sanguinous output (270 cc/24h), ostomy productive GU: clear yellow urine; 1215cc/24h Extr: wwp, no edema   Results for orders placed or performed during the hospital encounter of 10/18/19 (from the past 24 hour(s))  Glucose, capillary     Status: Abnormal   Collection Time: 11/01/19 11:29 AM  Result Value Ref Range   Glucose-Capillary 104 (H) 70 - 99 mg/dL  CBC     Status: Abnormal   Collection Time: 11/01/19 12:12 PM  Result Value Ref Range   WBC 13.4 (H) 4.0 - 10.5 K/uL   RBC 2.59 (L) 3.87 - 5.11 MIL/uL   Hemoglobin 8.2 (L) 12.0 - 15.0 g/dL   HCT 24.6 (L) 36 - 46 %   MCV 95.0 80.0 - 100.0 fL   MCH 31.7 26.0 - 34.0 pg   MCHC 33.3 30.0 - 36.0 g/dL   RDW 18.7 (H) 11.5 - 15.5 %   Platelets 191 150 - 400 K/uL   nRBC 0.4 (H) 0.0 - 0.2 %  Glucose, capillary     Status: Abnormal   Collection Time: 11/01/19  3:26 PM  Result Value Ref Range     Glucose-Capillary 104 (H) 70 - 99 mg/dL  Glucose, capillary     Status: None   Collection Time: 11/01/19  7:37 PM  Result Value Ref Range   Glucose-Capillary 92 70 - 99 mg/dL  CBC     Status: Abnormal   Collection Time: 11/01/19  8:24 PM  Result Value Ref Range   WBC 13.3 (H) 4.0 - 10.5 K/uL   RBC 2.73 (L) 3.87 - 5.11 MIL/uL   Hemoglobin 8.4 (L) 12.0 - 15.0 g/dL   HCT 25.6 (L) 36 - 46 %   MCV 93.8 80.0 - 100.0 fL   MCH 30.8 26.0 - 34.0 pg   MCHC 32.8 30.0 - 36.0 g/dL   RDW 18.7 (H) 11.5 - 15.5 %   Platelets 186 150 - 400 K/uL   nRBC 0.5 (H) 0.0 - 0.2 %  Glucose, capillary     Status: Abnormal   Collection Time: 11/01/19 11:28 PM  Result Value Ref Range   Glucose-Capillary 117 (H) 70 - 99 mg/dL  Glucose, capillary     Status: Abnormal   Collection Time: 11/02/19  3:23 AM  Result Value Ref Range   Glucose-Capillary 111 (H) 70 - 99 mg/dL  CBC     Status: Abnormal   Collection Time: 11/02/19  5:21 AM  Result Value Ref Range   WBC  12.4 (H) 4.0 - 10.5 K/uL   RBC 2.98 (L) 3.87 - 5.11 MIL/uL   Hemoglobin 9.2 (L) 12.0 - 15.0 g/dL   HCT 28.2 (L) 36 - 46 %   MCV 94.6 80.0 - 100.0 fL   MCH 30.9 26.0 - 34.0 pg   MCHC 32.6 30.0 - 36.0 g/dL   RDW 19.1 (H) 11.5 - 15.5 %   Platelets 209 150 - 400 K/uL   nRBC 0.3 (H) 0.0 - 0.2 %  Comprehensive metabolic panel     Status: Abnormal   Collection Time: 11/02/19  5:21 AM  Result Value Ref Range   Sodium 131 (L) 135 - 145 mmol/L   Potassium 3.6 3.5 - 5.1 mmol/L   Chloride 101 98 - 111 mmol/L   CO2 24 22 - 32 mmol/L   Glucose, Bld 102 (H) 70 - 99 mg/dL   BUN 10 6 - 20 mg/dL   Creatinine, Ser 0.32 (L) 0.44 - 1.00 mg/dL   Calcium 7.5 (L) 8.9 - 10.3 mg/dL   Total Protein 4.3 (L) 6.5 - 8.1 g/dL   Albumin 1.4 (L) 3.5 - 5.0 g/dL   AST 88 (H) 15 - 41 U/L   ALT 135 (H) 0 - 44 U/L   Alkaline Phosphatase 150 (H) 38 - 126 U/L   Total Bilirubin 0.7 0.3 - 1.2 mg/dL   GFR calc non Af Amer >60 >60 mL/min   GFR calc Af Amer >60 >60 mL/min   Anion  gap 6 5 - 15  Magnesium     Status: None   Collection Time: 11/02/19  5:21 AM  Result Value Ref Range   Magnesium 2.3 1.7 - 2.4 mg/dL  Phosphorus     Status: None   Collection Time: 11/02/19  5:21 AM  Result Value Ref Range   Phosphorus 3.7 2.5 - 4.6 mg/dL  Differential     Status: Abnormal   Collection Time: 11/02/19  5:21 AM  Result Value Ref Range   Neutrophils Relative % 86 %   Neutro Abs 10.6 (H) 1.7 - 7.7 K/uL   Lymphocytes Relative 9 %   Lymphs Abs 1.1 0.7 - 4.0 K/uL   Monocytes Relative 4 %   Monocytes Absolute 0.5 0 - 1 K/uL   Eosinophils Relative 0 %   Eosinophils Absolute 0.0 0 - 0 K/uL   Basophils Relative 0 %   Basophils Absolute 0.0 0 - 0 K/uL   Immature Granulocytes 1 %   Abs Immature Granulocytes 0.17 (H) 0.00 - 0.07 K/uL  Triglycerides     Status: None   Collection Time: 11/02/19  5:21 AM  Result Value Ref Range   Triglycerides 99 <150 mg/dL  Prealbumin     Status: Abnormal   Collection Time: 11/02/19  5:21 AM  Result Value Ref Range   Prealbumin 14.2 (L) 18 - 38 mg/dL  Glucose, capillary     Status: Abnormal   Collection Time: 11/02/19  7:45 AM  Result Value Ref Range   Glucose-Capillary 111 (H) 70 - 99 mg/dL    Assessment & Plan: The plan of care was discussed with the bedside nurse for the day, who is in agreement with this plan and no additional concerns were raised.   Present on Admission: . (Resolved) Hyponatremia . Crohn's disease of large intestine with other complication ([place]) . Malnutrition of moderate degree ([place]) . Pressure injury of skin . Peritoneal hematoma . 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   and I reviewed the patients new imaging test results.    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 Henrene Pastor, per GI, started Solumedrol 7/27 Sigmoid colon  perforation- S/P partial colectomy and end colostomy by Dr. Kae Heller 7/24. POD#8.  - now with large mesenteric hematoma. Leave alone, should tamponade.  See Dr. Pascal Lux note with IR for full explanation.  Agree with his assessment. -TNA, advance to FLD, although may have an ileus/early satiety due to hematoma - BID WD dressing changes.  Some fascial separation.  Continue to monitor -cont JP drain and monitor output, it is more bloody compared to when I saw her Friday  -cont q 6h CBC -WBC downtrending -tried to encourage patient/husband to get OOB however they refuse, as they fell mobilizing is what caused the mesenteric hematoma. Sepsis- per primary team, off pressors  Acute hypoxic respiratory failure Protein calorie malnutrition - prealbumibn 8/2 14.2  Acute on chronic anemia- s/p 3u pRBC 7/30-8/1, stable this AM, follow  Volume overload - ongoing diuresis per primary   FEN: TNA, FLD + ensure ID: [id number] VTE: SCD's, hep gtt on hold  Foley: continue for accurate I/O Dispo: ICU, stable for transfer OOU from surgical perspective.     Obie Dredge, PA-C Trauma & General Surgery Please use [email or web address] to contact on call provider  11/02/2019

## 2019-11-02 NOTE — Progress Notes (Signed)
Re-examined Mrs.Asano at bedside. Noted to have continued pitting edema. She mentions feeling that her legs are very heavy. Discussed with Ms.Hawks regarding plan to transfer out of ICU and starting diuretics for volume overload. Ms.Morones expressed understanding. While discussing risk and benefit of early mobilization, Mrs.Geyer became tearful and expressed feeling overwhelmed. She mentions agreeing with her husband's concern regarding fear of re-bleeding due to prior episode occurring shortly after PT session. Reassured Ms.Bera that she can progress at her pace and we can update the physical therapy team aware of her concerns.  Also spoke with Mr.Llewellyn via telephone. Had prolonged conversation regarding Mrs.Tapper's hospitalization and plan to progress out of the ICU. Discussed in detail regarding his concerns of recurrent bleed similar to those of Mrs.Obrien. Also mentions having concern regarding her diet as he feels the food in the hospital is not tailored for those with Crohn's Disease. Reassured Mr.Formoso that we will clarify dietary choices with staff RD. All other questions and concerns addressed.

## 2019-11-02 NOTE — Progress Notes (Addendum)
PHARMACY - TOTAL PARENTERAL NUTRITION CONSULT NOTE   Indication: Prolonged ileus  Patient Measurements: Height: 5' 1"  (154.9 cm) Weight: 77 kg (169 lb 12.1 oz) IBW/kg (Calculated) : 47.8 TPN AdjBW (KG): 55.1 Body mass index is 32.07 kg/m.  Assessment:  32 yof with remote history of sigmoid colectomy and recent admission d/t colitis initially presented with lower extremity edema and later developed abdominal pain with decreased appetite. S/p EGD + colonoscopy on 7/21 that showed Crohn's colitis. On 7/23 a CT showed large amount pneumoperitoneum and patients underwent exlap with partial colectomy and colostomy. To start TPN for anticipated prolonged ileus.   On 7/30 repeat CT abdomen was obtained and found new large hematoma in R peritoneal cavity and patient was transferred back to the ICU for close monitoring. IR consulted for possible drainage of hematoma though not a candidate for drainage. Not requiring pressors at this time. Vancomycin and meropenem were started for concern of intra-abdominal infection.   8/1: Unable to administer TPN due IV tubing issues. Running D10% at 75 mL/hr until TPN can be resumed this evening.   Glucose / Insulin: CBGs ranging from 92-111. Received 1 unit(s) of rSSI in 24 hours. Continues on solumedrol 23m q12h. Electrolytes: Na down to 131 . Other electrolytes wnl.  Renal: SCr stable. UOP 0.7 ml/kg/hr. LFTs / TGs: LFTs WNL. T bili normalized. TG 99 on 8/2.  Prealbumin / albumin: prealbumin increased from <5 to 14, albumin 1.4 Intake / Output; MIVF: 270 mL / 24 hrs drain output, 100 mL / 24 hrs colostomy output. Off MIVF GI Imaging: 7/23 Abd xray - ileus vs SBO 7/23 CT abd - Extensive pneumoperitoneum, most consistent with colonic perforation given the persistent diffuse colitis 7/29 CT abd - scattered small fluid collections, bowel inflammation 7/30 CT abd - R peritoneal cavity hematoma (at least 8017m, dilated colostomy loop and stomach containing air    Surgeries / Procedures:  7/21 Colonoscopy/EGD 7/24 Exlap, partial colectomy and colostomy  Central access: PICC placed 7/25 TPN start date: 7/25  Nutritional Goals (per RD recommendation on 7/29): kCal: 1700-1900, Protein: 90-105g, Fluid: >1.7L  Goal TPN rate is 60 mL/hr (provides 90 g of protein and 1841 kcals per day)  Current Nutrition:  Advanced to clear liquids on 8/1  Ensure twice daily - on hold  Boost three times daily - on hold TPN  Plan:  Concentrate TPN today in the setting of volume overload. Decrease TPN to new goal rate of 6039mr at 1800 - provides 90 g of protein and 1841 kcals per day (100% goal calories).  Electrolytes in TPN (no adjustments today): Continue 150 mEq/L of Na, adjust to 49m49m of K, 5mEq3mof Ca, 6mEq/5mf Mg, and 20mmol46mf Phos. Cl:Ac ratio 1:1 Continue 10 units of insulin in TPN  Adjust SSI to sensitive SSI q4h and adjust as needed Add standard MVI and trace elements to TPN and monitor tbili closely Monitor TPN labs on Mon/Thurs  BenjamiAlbertina ParrD., BCPS, BCCCP Clinical Pharmacist Clinical phone for 11/02/19 until 3:30pm: [phone] 3:30pm, please refer to AMION fM[facility] LLCit-specific pharmacist

## 2019-11-02 NOTE — Consult Note (Signed)
[place] Nurse ostomy follow up Stoma type/location: LUQ colsotomy Stomal assessment/size: 1 1/4"  Peristomal assessment: creasing at 3 and 9 o'clock.  Bruising at 4 o'clock noted as well. Will protect peristomal skin with barrier ring  Treatment options for stomal/peristomal skin: barrier ring and switching to 2 1/4" pouch.   Output soft brown stool Ostomy pouching: 2pc. 2 1/4" pouch with barrier ring. Supplies ordered.   Education provided: POuch change performed.  Measured stoma and patient cut the barrier to fit.  Discussed cleansing with mild soap and water.  She snapped barrier and pouch together and was assisted to apply barrier ring.  We both worked together to apply pouch.  She is able to roll closed.  We demonstrated emptying and she feels a lot more comfortable.  Enrolled patient in [place] Start Discharge program: Yes Will follow.  Domenic Moras MSN, RN, FNP-BC CWON Wound, Ostomy, Continence Nurse Pager [phone]

## 2019-11-02 NOTE — Progress Notes (Addendum)
Daily Rounding Note  11/02/2019, 2:05 PM  LOS: 14 days   SUBJECTIVE:   Chief complaint:     Tolerating Clears in small amounts, can only tolerate small amounts, if takes in too much gets abdominal "cramps".  Some abdominal tenderness but no intense abd pain.    OBJECTIVE:         Vital signs in last 24 hours:    Temp:  [97.9 F (36.6 C)-98.7 F (37.1 C)] 97.9 F (36.6 C) (08/02 1200) Pulse Rate:  [60-108] 67 (08/02 1200) Resp:  [10-26] 14 (08/02 1200) BP: (98-127)/(52-71) 114/65 (08/02 1200) SpO2:  [91 %-96 %] 95 % (08/02 1200) Last BM Date: 11/02/19 Filed Weights   10/18/19 1620 10/24/19 0452  Weight: 53.5 kg 77 kg   General: pleasant, calm, in good spirits.  Looks pale, not toxic   Heart: RRR Chest: clear bil.  Loose cough Abdomen: soft, Tender.  liquid brown stool in ostomy, bloody output in JP  drain Extremities: LE edema Neuro/Psych:  Pleasant, fluid speech.  No tremors, no gross deficits  Intake/Output from previous day: 08/01 0701 - 08/02 0700 In: 2020.6 [I.V.:1514.1; Blood:306.5; IV Piggyback:200] Out: 1585 [Urine:1215; Drains:270; Stool:100]  Intake/Output this shift: Total I/O In: 364.9 [I.V.:364.9] Out: 60 [Drains:60]  Lab Results: Recent Labs    11/01/19 1212 11/01/19 2024 11/02/19 0521  WBC 13.4* 13.3* 12.4*  HGB 8.2* 8.4* 9.2*  HCT 24.6* 25.6* 28.2*  PLT 191 186 209   BMET Recent Labs    10/31/19 0548 11/01/19 0617 11/02/19 0521  NA 133* 135 131*  K 3.8 3.8 3.6  CL 101 104 101  CO2 25 25 24   GLUCOSE 134* 93 102*  BUN 16 12 10   CREATININE 0.31* 0.30* 0.32*  CALCIUM 7.3* 7.5* 7.5*   LFT Recent Labs    10/31/19 0548 11/02/19 0521  PROT 4.1* 4.3*  ALBUMIN 1.5* 1.4*  AST 15 88*  ALT 37 135*  ALKPHOS 77 150*  BILITOT 1.1 0.7   PT/INR Recent Labs    10/31/19 1721  LABPROT 14.8  INR 1.2   Hepatitis Panel No results for input(s): HEPBSAG, HCVAB, HEPAIGM, HEPBIGM in  the last 72 hours.  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 Hospital day 12 admitted for Crohn's disease with colitis complicated by rectosigmoid perforation. Thrombus identified within the abdominal aorta on earlier exam. EXAM: CT ANGIOGRAPHY CHEST WITH CONTRAST TECHNIQUE: Multidetector CT imaging of the chest was performed using the standard protocol during bolus administration of intravenous contrast. Multiplanar CT image reconstructions and MIPs were obtained to evaluate the vascular anatomy. CONTRAST:  128m OMNIPAQUE IOHEXOL 350 MG/ML SOLN COMPARISON:  CT angiogram abdomen dated 10/29/2019. CT angiogram chest dated 10/18/2019. FINDINGS: Cardiovascular: No thoracic aortic aneurysm or evidence of aortic dissection. No intraluminal thrombus is seen within the thoracic aorta. There is no central pulmonary embolism identified. Heart size is normal. No pericardial effusion. Mediastinum/Nodes: No mass or enlarged lymph nodes are seen within the mediastinum. Esophagus is unremarkable. Trachea and central bronchi are unremarkable. Lungs/Pleura: Bilateral pleural effusions, moderate to large in size, with associated compressive atelectasis bilaterally. Additional mild scarring versus edema within each lung. No pneumothorax. Upper Abdomen: Partially imaged fluid collection within the RIGHT upper abdomen, described as a hematoma on CT abdomen of 10/30/2019, incompletely imaged on this exam. Kidneys are incompletely imaged. No new abnormality seen. Musculoskeletal: No acute or  suspicious osseous finding. Review of the MIP images confirms the above findings. IMPRESSION: 1. No thoracic aortic aneurysm or evidence of aortic dissection. No intraluminal thrombus is seen within the thoracic aorta. 2. Bilateral pleural effusions, moderate to large in size, with associated compressive atelectasis bilaterally. 3.  Additional mild scarring versus edema within each lung. 4. Partially imaged fluid collection within the RIGHT upper abdomen, described as a large hematoma on CT abdomen of 10/30/2019, incompletely imaged on this exam. Electronically Signed   By: Franki Cabot M.D.   On: 10/31/2019 16:25   ECHOCARDIOGRAM LIMITED  Result Date: 10/31/2019    ECHOCARDIOGRAM LIMITED REPORT   Patient Name:   Natasha Barrera Ripberger Date of Exam: 10/31/2019 Medical Rec #:  [id number]       Height:       61.0 in Accession #:    [id number]      Weight:       169.8 lb Date of Birth:  03/11/1966       BSA:          1.762 m Patient Age:    54 years        BP:           109/68 mmHg Patient Gender: F               HR:           108 bpm. Exam Location:  Inpatient Procedure: Limited Color Doppler, Color Doppler and Limited Echo STAT ECHO Indications:    arterial embolism  History:        Patient has prior history of Echocardiogram examinations, most                 recent 10/26/2019.  Sonographer:    Johny Chess Referring Phys: [id number] [place]  1. Left ventricular ejection fraction, by estimation, is 55 to 60%. The left ventricle has normal function. The left ventricle has no regional wall motion abnormalities.  2. Right ventricular systolic function is normal. The right ventricular size is normal. There is normal pulmonary artery systolic pressure.  3. The mitral valve is normal in structure. No evidence of mitral valve regurgitation. No evidence of mitral stenosis.  4. The aortic valve was not well visualized.  5. Limited echo FINDINGS  Left Ventricle: Left ventricular ejection fraction, by estimation, is 55 to 60%. The left ventricle has normal function. The left ventricle has no regional wall motion abnormalities. Definity contrast agent was given IV to delineate the left ventricular  endocardial borders. The left ventricular internal cavity size was normal in size. There is no left ventricular hypertrophy. Right  Ventricle: The right ventricular size is normal. No increase in right ventricular wall thickness. Right ventricular systolic function is normal. There is normal pulmonary artery systolic pressure. The tricuspid regurgitant velocity is 2.78 m/s, and  with an assumed right atrial pressure of 0 mmHg, the estimated right ventricular systolic pressure is 41.9 mmHg. Pericardium: A small pericardial effusion is present. The pericardial effusion is circumferential. Mitral Valve: The mitral valve is normal in structure. No evidence of mitral valve stenosis. Tricuspid Valve: The tricuspid valve is normal in structure. Tricuspid valve regurgitation is trivial. No evidence of tricuspid stenosis. Aortic Valve: The aortic valve was not well visualized. Pulmonic Valve: The pulmonic valve was not well visualized. Pulmonic valve regurgitation is not visualized. No evidence of pulmonic stenosis. Pulmonary Artery: Indeterminant PASP, inadequate TR jet. TRICUSPID VALVE TR Peak grad:   30.9 mmHg TR  Vmax:        278.00 cm/s Carlyle Dolly MD Electronically signed by Carlyle Dolly MD Signature Date/Time: 10/31/2019/3:47:30 PM    Final    Scheduled Meds: . Chlorhexidine Gluconate Cloth  6 each Topical Daily  . feeding supplement  1 Container Oral TID BM  . feeding supplement (ENSURE ENLIVE)  237 mL Oral BID BM  . insulin aspart  0-9 Units Subcutaneous Q4H  . methocarbamol  1,000 mg Oral TID  . methylPREDNISolone (SOLU-MEDROL) injection  40 mg Intravenous Q12H  . sodium chloride flush  10-40 mL Intracatheter Q12H  . sodium chloride flush  3 mL Intravenous Once  . vitamin A  10,000 Units Oral Daily   Continuous Infusions: . sodium chloride Stopped (10/31/19 0453)  . TPN ADULT (ION)    . TPN ADULT (ION)     PRN Meds:.sodium chloride, acetaminophen, HYDROmorphone (DILAUDID) injection, ondansetron, sodium chloride flush   ASSESMENT:    *  Crohns colitis.  Initially present during 7/2 admission.  Ongoing Solumedrol  since 7/27.      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  Sigmoid colectomy for diverticulitis 2013.  10/29/19 CTAP w angio: infrarenal thrombus decreased, ? Small renal infarct. Poorly defined fluid/gas at anterior abdominal cavity near midline incision.  Poorly defined fluid along the anterior abdominal cavity measures roughly 4.5 x 0.9 x 2.4 cm. Fluid-filled structure in the right hemipelvic region appears to be related to the cecum rather than extraluminal fluidcollection. Small amount of fluid along the lateral left abdomen.  Trace perisplenic fluid. Diffuse subcutaneous edema. ? small fluid collection between loops of bowel in the mid abdomen  Persistent R colon wall thickening/inflammation.  Quant gold test indeterminate.  TPMT testing of 7/25 still in progress.  10/30/19 CTAP w contrast: New, large hematoma in R peritoneum.  Progression of bil renal infarcts w R hydronephrosis .  Increased small fluid collection w small foci of gas ventral to uterus.  New foci of extraluminal gas and increased small volume of low-density fluid along the caudal aspect of the hematoma near the uterine fundus Dilated colostomy loop and dilated stomach.  Simple, small volume, abdomina ascites.      10/31/19 CT chest w angio: RUQ fluid collection/hematoma.   On TPN.  IV heparin stopped on 7/30.     *   Infrarenal aortic thrombus.  IV Heparin stopped 7/30  *   bil renal infarcts.    *   Bil large pleural effusions.    *    Protein cal malnutrition, moderate. Pre albumin is <5.  On TNA and soft diet, ensure and boost.    *   Macrocytic anemia.  Suspect multi-factorial, some is blood loss from GI tract as having blood in ostomy output, no bleeding before surgery.    *   Hyponatremia.     PLAN   *   Given all pt's complications, will start Protonix 40 mg daily for ulcer prophylaxis.    *   Agree with [place] diet.     Azucena Freed  11/02/2019, 2:05 PM Phone [phone]  [phone]   ________________________________________________________________________  Velora Heckler GI MD note:  I personally examined the patient, reviewed the data and agree with the assessment and plan described above.  She has severe Crohn's colitis and suffered a perforation at sigmoid anastomosis 2 days after colonoscopy. Currently no medical options for her Crohn's disease other than steroids.  She is debilitated, has large pleural effusions and a  large intraabdominal hematoma. On TNA.  Will follow along, she needs to be up and around as much as possible to prevent further deconditioning, deterioration.  She is reluctant given the hematoma however.   Owens Loffler, MD [facility]enter[facility] Pager [phone]

## 2019-11-02 NOTE — Progress Notes (Addendum)
Subjective: Patient interviewed at bedside with husband.  She was observed resting comfortably in bed. She mentions having improvement in her abdominal discomfort and pain but mentions having increased flatulence. She mentions noticing change in quality of her stool from bright red blood to brown stool.  Tolerating small sips of liquids well.  Discussed possible compressive symptoms from hematoma and advised to eat small portions.  Objective:  Vital signs in last 24 hours: Vitals:   11/02/19 0430 11/02/19 0500 11/02/19 0530 11/02/19 0600  BP: 116/70 127/68 (!) 110/58 (!) 110/52  Pulse: 78 (!) 108 67 65  Resp: 14 (!) 26 (!) 10 (!) 11  Temp:      TempSrc:      SpO2: 93% 94% 94% 94%  Weight:      Height:       Physical Exam Vitals and nursing note reviewed.  Constitutional:      General: She is not in acute distress.    Appearance: She is ill-appearing. She is not toxic-appearing.  HENT:     Head: Normocephalic and atraumatic.  Eyes:     Extraocular Movements: Extraocular movements intact.  Cardiovascular:     Rate and Rhythm: Normal rate and regular rhythm.     Pulses: Normal pulses.          Radial pulses are 2+ on the right side and 2+ on the left side.       Dorsalis pedis pulses are 2+ on the right side and 2+ on the left side.     Heart sounds: Normal heart sounds, S1 normal and S2 normal. No murmur heard.   Pulmonary:     Effort: Pulmonary effort is normal. No respiratory distress.     Breath sounds: Normal breath sounds. No wheezing.  Abdominal:     General: There is no distension.     Palpations: Abdomen is soft. There is no mass.     Tenderness: There is no guarding.     Comments: Mild tenderness around incision, incision is clean, dry, with no erythema  Musculoskeletal:     Right lower leg: 2+ Edema present.     Left lower leg: 2+ Edema present.  Neurological:     General: No focal deficit present.     Mental Status: She is alert. Mental status is at  baseline.  Psychiatric:        Mood and Affect: Mood normal.        Behavior: Behavior normal.        Thought Content: Thought content normal.      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 Zimny is a 54 yr old female with PMHx of HTN, Diverticulitis s/p sigmoid colectomy (2013), prior episode of colitis (in teens), repaired ventral hernia. Left AMA 7/4, returned due leg swelling, worsening fatigue, poor appetite, and loose stool. She had a colonoscopy on 7/21 and had a diagnosis of Crohn's, she was started on steroids. Her hospitalization has been complicated by a bowel perforation discovered on 7/24, had to have urgent partial colectomy and colostomy. Had hypotension after surgery eventually requiring vasopressors. Patient declines PT or desire to get out of bed due to fear that previous attempts caused hematoma to form.  Crohn's Flare/Sigmoid Colon perforation: Had colectomy on 7/24. Ostomy has had some sanguinous drainage but has started to have formed stools. Had to stop TPN due to filter becoming clogged, started on LR until TPN can be resumed. -Continue following surgical recommendations -advancing diet to full liquids -Restart TPN as soon as filter replaced -Continue Solumedrol 40 mg BID -Empiric antibiotics were discontinued on 8/1.   Right peritoneal hematoma/Hemorrhagic Shock: Has had steady decline in Hemoglobin counts, most likely secondary to continued oozing of the hematoma. Transfusions have stabilized. Has been volume overloaded so will hold maintenance fluids. Trend hemoglobin and transfuse if needed. Monitor renal output. Hold anticoagulants due to bleeding risk. In our view, mobilizing with PT is a net benefit for  the patient, should be low risk for causing further bleeding.  -Trend hemoglobin, transfuse if needed -No surgical or IR drainage recommended. This will have to resolve on it's own as long as it remains stable.   Renal Infarcts/Abdominal aortic thrombus: Concern for embolic events high, patient is hypercoagulable due to Crohn's flare. However we must hold anticoagulation for now due to bleeding into hematoma. Renal function unchanged for now. Urine output yesterday 1215 cc.  -Monitor for signs of infarcts -Monitor renal function -Monitor renal output   Right Hydronephrosis: due to compression of the right ureter by the intra-abdominal hematoma.  So far renal function is preserved, urine output is excellent.  If the hydronephrosis worsens, may need to consult with urology to consider ureteral stenting versus percutaneous drainage.   Severe protein calorie malnutrition Albumin 1.5. On TPN due to GI complications. Had to stop TPN due to filter becoming clogged, started on LR until TPN can be resumed. - TPN per RD -Per surgery advanced diet to full liquids.   DVT prophx: SCDs Diet: full liquids/TPN HWE:XHBZ   Prior to Admission Living Arrangement: Home Anticipated Discharge Location: Home/SNF Barriers to Discharge: Complex Medical Issues Dispo: Anticipated discharge in approximately 5-7 day(s).    Briant Cedar, MD 11/02/2019, 7:13 AM Pager: [phone] After 5pm on weekdays and 1pm on weekends: On Call pager [phone]

## 2019-11-02 NOTE — Progress Notes (Signed)
Nutrition Follow-up  DOCUMENTATION CODES:   Non-severe (moderate) malnutrition in context of chronic illness  INTERVENTION:   Encourage PO intake on full liquid diet; encouraged pt to try and consume foods at meals and to drink supplements after. Pt is drinking her ensures and this may be where she gets more of her nutrition until pt is tolerating a soft diet and appetite improves.   Ensure Enlive po QID after meals and bedtime, each supplement provides 350 kcal and 20 grams of protein  TPN continues; recommend only wean once pt eating/taking oral nutrition supplements to meet >60% of needs  NUTRITION DIAGNOSIS:   Moderate Malnutrition related to chronic illness (crohn's disease) as evidenced by moderate muscle depletion, moderate fat depletion; ongoing  GOAL:   Patient will meet greater than or equal to 90% of their needs; progressing  MONITOR:   PO intake, Supplement acceptance, Skin, Weight trends, Labs, I & O's  REASON FOR ASSESSMENT:   Malnutrition Screening Tool, Consult Assessment of nutrition requirement/status  ASSESSMENT:   [name] is a 54 year old female with past medical history of HTN, diverticulitis s/p sigmoid colectomy in 2013, ventral hernia (repaired), and prior episode of colitis, and a recent hospitalization for diarrhea, hypotension and electrolyte derangements secondary to colitis who returned nine days after leaving AMA found to have continued electrolyte derangements with new onset peripheral edema.   Pt discussed during ICU rounds and with RN.    Pt with Inflammatory Colitis secondary to Crohn's Disease  7/24 s/p ex lap, LOA, segmental colectomy with end colostomy due to large perforation in the prior rectosigmoid anastomosis 7/25 TPN started due to anticipated prolonged ileus post op 7/31 pt found to have large mesenteric hematoma (11.3 cm) within R retroperitoneum with innumerable bil renal infarcts - no intervention at this time; pt  NPO 8/2 diet advanced to full liquids  Medications reviewed and include: SSI, vitamin A Labs reviewed JP: 270 ml  BLE with 4 plus edema UOP: 1215 ml   Concentrated TPN @ 60 ml/hr provides 1841 kcal and 90 grams protein  Diet Order:   Diet Order            Diet full liquid Room service appropriate? Yes; Fluid consistency: Thin  Diet effective now                 EDUCATION NEEDS:   No education needs have been identified at this time  Skin:  Skin Assessment: Skin Integrity Issues: Skin Integrity Issues:: Stage I Stage I: sacrum  Last BM:  100 ml via colostomy  Height:   Ht Readings from Last 1 Encounters:  10/24/19 5' 1"  (1.549 m)    Weight:   Wt Readings from Last 1 Encounters:  10/24/19 77 kg    Ideal Body Weight:  54.5 kg  BMI:  Body mass index is 32.07 kg/m.  Estimated Nutritional Needs:   Kcal:  1700-1900  Protein:  90-105 grams  Fluid:  > 1.7 L  Natasha Barrera P., RD, LDN, CNSC See AMiON for contact information

## 2019-11-03 LAB — TYPE AND SCREEN
ABO/RH(D): O POS
Antibody Screen: NEGATIVE
Unit division: 0
Unit division: 0
Unit division: 0
Unit division: 0
Unit division: 0

## 2019-11-03 LAB — BASIC METABOLIC PANEL
Anion gap: 8 (ref 5–15)
BUN: 10 mg/dL (ref 6–20)
CO2: 22 mmol/L (ref 22–32)
Calcium: 7.5 mg/dL — ABNORMAL LOW (ref 8.9–10.3)
Chloride: 101 mmol/L (ref 98–111)
Creatinine, Ser: <0.3 mg/dL — ABNORMAL LOW (ref 0.44–1.00)
Glucose, Bld: 111 mg/dL — ABNORMAL HIGH (ref 70–99)
Potassium: 3.7 mmol/L (ref 3.5–5.1)
Sodium: 131 mmol/L — ABNORMAL LOW (ref 135–145)

## 2019-11-03 LAB — BPAM RBC
Blood Product Expiration Date: 202108032359
Blood Product Expiration Date: 202108062359
Blood Product Expiration Date: 202108292359
Blood Product Expiration Date: 202108302359
Blood Product Expiration Date: 202108302359
ISSUE DATE / TIME: 202107301225
ISSUE DATE / TIME: 202107302347
ISSUE DATE / TIME: 202107302347
ISSUE DATE / TIME: 202108010755
Unit Type and Rh: 5100
Unit Type and Rh: 5100
Unit Type and Rh: 5100
Unit Type and Rh: 5100
Unit Type and Rh: 9500

## 2019-11-03 LAB — CBC
HCT: 29.8 % — ABNORMAL LOW (ref 36.0–46.0)
Hemoglobin: 9.8 g/dL — ABNORMAL LOW (ref 12.0–15.0)
MCH: 31.5 pg (ref 26.0–34.0)
MCHC: 32.9 g/dL (ref 30.0–36.0)
MCV: 95.8 fL (ref 80.0–100.0)
Platelets: 221 10*3/uL (ref 150–400)
RBC: 3.11 MIL/uL — ABNORMAL LOW (ref 3.87–5.11)
RDW: 19.8 % — ABNORMAL HIGH (ref 11.5–15.5)
WBC: 12.9 10*3/uL — ABNORMAL HIGH (ref 4.0–10.5)
nRBC: 0.2 % (ref 0.0–0.2)

## 2019-11-03 LAB — GLUCOSE, CAPILLARY
Glucose-Capillary: 119 mg/dL — ABNORMAL HIGH (ref 70–99)
Glucose-Capillary: 119 mg/dL — ABNORMAL HIGH (ref 70–99)
Glucose-Capillary: 121 mg/dL — ABNORMAL HIGH (ref 70–99)
Glucose-Capillary: 122 mg/dL — ABNORMAL HIGH (ref 70–99)
Glucose-Capillary: 123 mg/dL — ABNORMAL HIGH (ref 70–99)
Glucose-Capillary: 126 mg/dL — ABNORMAL HIGH (ref 70–99)
Glucose-Capillary: 126 mg/dL — ABNORMAL HIGH (ref 70–99)
Glucose-Capillary: 129 mg/dL — ABNORMAL HIGH (ref 70–99)
Glucose-Capillary: 129 mg/dL — ABNORMAL HIGH (ref 70–99)
Glucose-Capillary: 140 mg/dL — ABNORMAL HIGH (ref 70–99)

## 2019-11-03 MED ORDER — FUROSEMIDE 10 MG/ML IJ SOLN
40.0000 mg | Freq: Two times a day (BID) | INTRAMUSCULAR | Status: DC
  Administered 2019-11-03 – 2019-11-05 (×4): 40 mg via INTRAVENOUS
  Filled 2019-11-03 (×5): qty 4

## 2019-11-03 MED ORDER — TRACE MINERALS CU-MN-SE-ZN 300-55-60-3000 MCG/ML IV SOLN
INTRAVENOUS | Status: DC
  Filled 2019-11-03: qty 604.8

## 2019-11-03 MED ORDER — FUROSEMIDE 10 MG/ML IJ SOLN: 40.0000 mg | Freq: Every day | INTRAMUSCULAR | Status: DC

## 2019-11-03 NOTE — Progress Notes (Signed)
PHARMACY - TOTAL PARENTERAL NUTRITION CONSULT NOTE   Indication: Prolonged ileus  Patient Measurements: Height: 5' 1"  (154.9 cm) Weight: 77 kg (169 lb 12.1 oz) IBW/kg (Calculated) : 47.8 TPN AdjBW (KG): 55.1 Body mass index is 32.07 kg/m.  Assessment:  27 yof with remote history of sigmoid colectomy and recent admission d/t colitis initially presented with lower extremity edema and later developed abdominal pain with decreased appetite. S/p EGD + colonoscopy on 7/21 that showed Crohn's colitis. On 7/23 a CT showed large amount pneumoperitoneum and patients underwent exlap with partial colectomy and colostomy. To start TPN for anticipated prolonged ileus.   On 7/30 repeat CT abdomen was obtained and found new large hematoma in R peritoneal cavity and patient was transferred back to the ICU for close monitoring. IR consulted for possible drainage of hematoma though not a candidate for drainage. Not requiring pressors at this time. Vancomycin and meropenem were started for concern of intra-abdominal infection.   8/1: Unable to administer TPN due IV tubing issues. Running D10% at 75 mL/hr until TPN can be resumed this evening.   Glucose / Insulin: CBGs ranging from 117-130. Received 2 unit(s) of rSSI in 24 hours. Continues on solumedrol 81m q12h. Electrolytes: Na low at 131 . Other electrolytes wnl. On lasix 40 BID  Renal: SCr stable. UOP 1.4 ml/kg/hr. LFTs / TGs: LFTs WNL. T bili normalized. TG 99 on 8/2.  Prealbumin / albumin: prealbumin increased from <5 to 14, albumin 1.4 Intake / Output; MIVF: 270 mL / 24 hrs drain output, 240 mL / 24 hrs colostomy output. Off MIVF GI Imaging: 7/23 Abd xray - ileus vs SBO 7/23 CT abd - Extensive pneumoperitoneum, most consistent with colonic perforation given the persistent diffuse colitis 7/29 CT abd - scattered small fluid collections, bowel inflammation 7/30 CT abd - R peritoneal cavity hematoma (at least 8059m, dilated colostomy loop and stomach  containing air   Surgeries / Procedures:  7/21 Colonoscopy/EGD 7/24 Exlap, partial colectomy and colostomy  Central access: PICC placed 7/25 TPN start date: 7/25  Nutritional Goals (per RD recommendation on 7/29): kCal: 1700-1900, Protein: 90-105g, Fluid: >1.7L  Goal TPN rate is 60 mL/hr (provides 90 g of protein and 1841 kcals per day)  Current Nutrition:  Advanced to full liquids on 8/2  Ensure twice daily - on hold  TPN  Plan:  Continue concentrated TPN at goal rate of 6061mr at 1800 - provides 90 g of protein and 1841 kcals per day (100% goal calories).  Electrolytes in TPN (no adjustments today): Continue 150 mEq/L of Na, adjust to 34m19m of K, 5mEq76mof Ca, 6mEq/57mf Mg, and 20mmol9mf Phos. Adjust Cl:Ac ratio to 1:2 Monitor K and Mg closely as patient is being diuresed  Continue 10 units of insulin in TPN  Adjust SSI to sensitive SSI q4h and adjust as needed Add standard MVI and trace elements to TPN and monitor tbili closely Monitor TPN labs on Mon/Thurs  BenjamiAlbertina ParrD., BCPS, BCCCP Clinical Pharmacist Clinical phone for 11/03/19 until 3:30pm: [phone] 3:30pm, please refer to [facility] LLCit-specific pharmacist

## 2019-11-03 NOTE — Progress Notes (Addendum)
Subjective: Interviewed patient at bedside.  States she feels like yesterday, "as best as I can be right now" Belly feels itchy, sore tight. Reports she is still cautious with drinking water, but was able to tolerate broth and coffee this morning without problems. Able to drink Ensure with breakfast.  Still hesitant to get out of bed because she doesn't want to cause anymore damage. She is willing to sit on side of bed and stand up, but feels walking might be too much right now.   Objective:  Vital signs in last 24 hours: Vitals:   11/02/19 2000 11/02/19 2238 11/02/19 2348 11/03/19 0428  BP: 118/67 111/67    Pulse: 66 72    Resp: 20 18    Temp: 98.3 F (36.8 C) 97.7 F (36.5 C) 98.4 F (36.9 C) 97.8 F (36.6 C)  TempSrc: Oral Oral Oral Oral  SpO2: 94% 93%    Weight:      Height:       Physical Exam Vitals and nursing note reviewed.  Constitutional:      General: She is not in acute distress.    Appearance: She is ill-appearing. She is not toxic-appearing.  HENT:     Head: Normocephalic and atraumatic.  Cardiovascular:     Pulses:          Radial pulses are 2+ on the right side and 2+ on the left side.       Dorsalis pedis pulses are 2+ on the right side and 2+ on the left side.     Comments: Edema extends up to pelvis. Abdominal:     General: There is no distension.     Palpations: Abdomen is soft. There is no mass.     Tenderness: There is abdominal tenderness.  Musculoskeletal:     Right lower leg: 2+ Pitting Edema present.     Left lower leg: 2+ Pitting Edema present.  Neurological:     General: No focal deficit present.     Mental Status: She is alert. Mental status is at baseline.  Psychiatric:        Mood and Affect: Mood normal.        Behavior: Behavior normal.        Thought Content: Thought content normal.     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 Had hypotension after surgery eventually requiring vasopressors. Post operative course complicated by intra-abdominal hematoma that occurred while anticoagulated. Had a long conversation yesterday about importance of moving and trying to get out of bed. Patient was reassured that she can discuss with PT and make a session tailored around her concerns, she was agreeable to this. Will get CTA of abdomin to monitor hematoma and its mass effect on surrounding structures. Gross hematuria with sedimentation noted today. Will await CTA for further follow up.    Crohn's Flare/Sigmoid Colon perforation: Had colectomy on 7/24. Ostomy has had some sanguinous drainage but has started to have formed stools. On TPN for severe protein malnutrition. Encouraged eating what possible at a slow pace. She reports being able to  drink some water, broth, coffee, and Ensure. -Continue advancing diet as able -Continue dressing changes on wound -Blood cultures drawn 7/31- No growth at 2 days -Solumedrol 35m IV q12 hours, today is day 14 of steroids. Will talk with GI service about plan to start tapering this, biologic therapy not an option right now due to hospitalization and illness.   Right peritoneal hematoma/Hemorrhagic Shock: Had steady decline in Hemoglobin counts, most likely secondary to continued oozing of the hematoma. Transfusions stabilized hemoglobin. Hemoglobin was 12.9 this morning. Has been volume overloaded so will continue to hold maintenance fluids. Responded well to lasix yesterday so will continue with 40 mg BID. Trend hemoglobin and transfuse if needed. Monitor renal  output. -Continue to trend hemoglobin -Lasix 40 mg IV BID -Continue to monitor urine output -Will get CT abdomen today to evaluate for hematoma changes   Renal Infarcts/Abdominal aortic thrombus: Renal function unchanged for now. Urine output yesterday 2000 cc. High concern for additional thrombi and possible infarcts. Patient is hypercoagulable and has not gotten out of bed for several days. Will get repeat CTA to monitor hematoma for changes in size and mass effect on surrounding structures to determine appropriateness of resuming heparin. Will continue SCD's and discussed importance of working with PT as much as patient feels safe to to try to reduce thrombotic risk. -Await CTA results for restarting heparin  -Difficult decision to restart anticoagulation. She will be at high bleeding risk, but also high risk for clot if we do not anticoagulate. Will need to discuss more with the patient as she does like to be involved in decision making.    Right Hydronephrosis: Due to compression of the right ureter by the intra-abdominal hematoma. Will obtain CTA to monitor hematoma for changes in size and change in kidney size. -CTA to assess degree of hydronephrosis -Possible consult to Urology   Severe protein calorie malnutrition Albumin 1.5. On TPN due to GI complications. Yesterday had to stop TPN due to filter becoming clogged, but it was restarted. Advanced to full liquids yesterday, will continue to advance diet per surgery and as tolerated. Able to drink some water, broth, coffee, and Ensure at breakfast -Continue advancing diet as able   DVT prophx: SCDs Diet: full liquids/TPN IPPI:[id number]    Prior to Admission Living Arrangement: Home Anticipated Discharge Location: Home/SNF Barriers to Discharge: Complex Medical Issues Dispo: Anticipated discharge in approximately 5-7 day(s).    PBriant Cedar MD 11/03/2019, 6:07 AM Pager: [phone] 5pm on weekdays and 1pm on  weekends: On Call pager [phone]

## 2019-11-03 NOTE — Progress Notes (Signed)
Occupational Therapy Treatment Patient Details Name: Natasha Barrera MRN: [id number] DOB: 11/21/1965 Today's Date: 11/03/2019    History of present illness Natasha Barrera is a 54 year old female with a past medical history significant for hypertension and diverticulitis (status post sigmoid colectomy 2015) who originally presented with complaints of lower extremity edema with later reports of abdominal pain and decreased appetite.  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   OT comments  Pt increasing to EOB, gently encouraged to get OOB to recliner today to improve outcome as it has been 6 days since OOB. Pt continues to state "I will get to EOB and stand. That is all I am up for today." Pt appears to be self limiting and very anxious, but does c/o abdomen pain even with abdominal binder on. Pt performing set-upA for grooming at EOB ~4 mins with increased time due to anxiety.  Pt given HEP for BUEs with pursed lip breathing to be done in bed or recliner. Pt encouraged to move BUEs/BLEs to reduce swelling. HR 95-133 BPM (mostly around 113 with exertion); O2 on RA >94% and BP stable. Pt would benefit from continued OT skilled services for ADL, mobility and safety. OT following acutely.   Follow Up Recommendations  CIR;Supervision/Assistance - 24 hour    Equipment Recommendations  None recommended by OT    Recommendations for Other Services      Precautions / Restrictions Precautions Precautions: Fall Precaution Comments: JP drain R flank, colostomy Restrictions Weight Bearing Restrictions: No       Mobility Bed Mobility Overal bed mobility: Needs Assistance Bed Mobility: Rolling;Sidelying to Sit Rolling: Min assist Sidelying to sit: Min assist     Sit to  sidelying: Mod assist General bed mobility comments: pt requires cues for log roll technique, trunk support and BLE management.  Transfers Overall transfer level: Needs assistance   Transfers: Sit to/from Stand Sit to Stand: Mod assist         General transfer comment: performed with PT; front to front sit to stand and took 4 steps to [place] Overall balance assessment: Needs assistance Sitting-balance support: Bilateral upper extremity supported;Feet supported Sitting balance-Leahy Scale: Fair     Standing balance support: Bilateral upper extremity supported Standing balance-Leahy Scale: Poor Standing balance comment: reliant on external support; took 4 steps to [facility]                           ADL either performed or assessed with clinical judgement   ADL Overall ADL's : Needs assistance/impaired     Grooming: Set up;Sitting Grooming Details (indicate cue type and reason): Pt progressing to EOB for grooming tasks; pt reports "I am hyperventilating" items on tray table ready for pt use. Pt instructed on pursed lip breathing.             Lower Body Dressing: Moderate assistance;Sitting/lateral leans;Bed level Lower Body Dressing Details (indicate cue type and reason): Pt performing near figure 4 with BUEs assisting BLEs alernating to perform figure 4 to donn socks     Toileting- Clothing Manipulation and Hygiene: Total assistance Toileting - Clothing Manipulation Details (indicate cue type and reason): colostomy and purewick      Functional mobility during ADLs: Moderate assistance;Cueing for safety;Cueing for  sequencing General ADL Comments: Pt increasing to EOB, gently encouraged to get OOB to recliner today to improve outcome as it has been 6 days since OOB. Pt continues to state "I will get to EOB and stand. That is all I am up for today." Pt appears to be self limiting, but does c/o abdomen pain even with abdominal binder on.      Vision        Perception     Praxis      Cognition Arousal/Alertness: Awake/alert Behavior During Therapy: Anxious Overall Cognitive Status: Within Functional Limits for tasks assessed                                 General Comments: Pt highly anxious due to recent hematoma and stating "I'm feeling overwhlemed and it doesn't help that my husband was escorted out yesterday ofr acting out."        Exercises Exercises: Other exercises;General Lower Extremity General Exercises - Lower Extremity Ankle Circles/Pumps: AROM;Supine;Both;20 reps Short Arc Quad: AROM;Both;10 reps;Supine Heel Slides: Both;Supine;10 reps (holds for 5 secs) Other Exercises Other Exercises: AROM with pursed lip breathing at shoulders, elbow and hands x10 reps   Shoulder Instructions       General Comments HR 95-133 BPM (mostly around 113 with exertion); O2 on RA >94% and BP stable:110/59    Pertinent Vitals/ Pain       Pain Assessment: Faces Faces Pain Scale: Hurts even more Pain Location: abdomen with mobility Pain Descriptors / Indicators: Operative site guarding Pain Intervention(s): Monitored during session;Limited activity within patient's tolerance;Repositioned  Home Living                                          Prior Functioning/Environment              Frequency  Min 2X/week        Progress Toward Goals  OT Goals(current goals can now be found in the care plan section)  Progress towards OT goals: Progressing toward goals  Acute Rehab OT Goals Patient Stated Goal: to have less pain and less swelling in legs  OT Goal Formulation: With patient Time For Goal Achievement: 11/12/19 Potential to Achieve Goals: Good ADL Goals Pt Will Perform Grooming: with min guard assist;standing Pt Will Perform Upper Body Bathing: with set-up;with supervision;sitting Pt Will Perform Lower Body Bathing: with min guard assist;sit to/from stand Pt Will Perform Upper Body  Dressing: with set-up;sitting Pt Will Perform Lower Body Dressing: with min guard assist;sit to/from stand Pt Will Transfer to Toilet: with min guard assist;ambulating;regular height toilet;bedside commode;grab bars Pt Will Perform Toileting - Clothing Manipulation and hygiene: with min guard assist  Plan Discharge plan remains appropriate    Co-evaluation    PT/OT/SLP Co-Evaluation/Treatment: Yes Reason for Co-Treatment: To address functional/ADL transfers;Necessary to address cognition/behavior during functional activity PT goals addressed during session: Mobility/safety with mobility OT goals addressed during session: ADL's and self-care      AM-PAC OT "6 Clicks" Daily Activity     Outcome Measure   Help from another person eating meals?: A Little Help from another person taking care of personal grooming?: A Little Help from another person toileting, which includes using toliet, bedpan, or urinal?: A Lot Help from another person bathing (including washing, rinsing, drying)?: A Lot Help from another person to  put on and taking off regular upper body clothing?: A Lot Help from another person to put on and taking off regular lower body clothing?: A Lot 6 Click Score: 14    End of Session Equipment Utilized During Treatment: Gait belt  OT Visit Diagnosis: Unsteadiness on feet (R26.81);Muscle weakness (generalized) (M62.81);Pain Pain - part of body:  (abdomen)   Activity Tolerance Patient limited by fatigue;Patient limited by pain   Patient Left in bed;with call bell/phone within reach;with bed alarm set   Nurse Communication Mobility status        Time: 8004-4715 OT Time Calculation (min): 33 min  Charges: OT General Charges $OT Visit: 1 Visit OT Treatments $Self Care/Home Management : 8-22 mins  Jefferey Pica, OTR/L Acute Rehabilitation Services Pager: [phone] Office: [place] 11/03/2019, 4:59 PM

## 2019-11-03 NOTE — Progress Notes (Signed)
Inpatient Rehab Admissions Coordinator:   Therapy continues to recommend CIR.  At this time, pt does not demonstrate tolerance for CIR level therapy.  We will follow from a distance.    Shann Medal, PT, DPT Admissions Coordinator [phone] 11/03/19  2:20 PM

## 2019-11-03 NOTE — Progress Notes (Addendum)
General Surgery Follow Up Note  Subjective:    Overnight Issues:  NAEO Tolerating FLD - had some soup this AM and drinking a chocolate ensure. Denies abdominal pain but still has intermittent discomfort with eating. Making good urine, but urine is dark - purewick in place. States she wants to perform exercises in the bed and progress to getting out of bed over the next couple of days. Making stool via ostomy. Denies emesis.  Hgb/hct 9.8 from 9.2  Objective:  Vital signs for last 24 hours: Temp:  [97.7 F (36.5 C)-98.4 F (36.9 C)] 98.2 F (36.8 C) (08/03 0819) Pulse Rate:  [63-85] 64 (08/03 0819) Resp:  [11-20] 20 (08/03 0819) BP: (106-119)/(59-88) 116/69 (08/03 0819) SpO2:  [92 %-97 %] 94 % (08/03 0819)  Hemodynamic parameters for last 24 hours:    Intake/Output from previous day: 08/02 0701 - 08/03 0700 In: 750.8 [I.V.:750.8] Out: 2840 [Urine:2600; Drains:240]  Intake/Output this shift: No intake/output data recorded.  Vent settings for last 24 hours:    Physical Exam:  Gen: comfortable, no distress Neuro: non-focal exam HEENT: PERRL Neck: supple CV: RRR Pulm: unlabored breathing Abd: soft, appropriately TTP, midline wound dressing c/d/i, JP with sanguinous output (120 cc/24h), ostomy productive GU: dark urine; 2600cc/24h Extr: wwp, pitting bilateral lower extremity edema, pedal pulses 2+ BL   Results for orders placed or performed during the hospital encounter of 10/18/19 (from the past 24 hour(s))  Glucose, capillary     Status: Abnormal   Collection Time: 11/02/19 11:14 AM  Result Value Ref Range   Glucose-Capillary 117 (H) 70 - 99 mg/dL  Glucose, capillary     Status: Abnormal   Collection Time: 11/02/19  3:20 PM  Result Value Ref Range   Glucose-Capillary 104 (H) 70 - 99 mg/dL  Glucose, capillary     Status: Abnormal   Collection Time: 11/02/19  7:24 PM  Result Value Ref Range   Glucose-Capillary 130 (H) 70 - 99 mg/dL  Glucose, capillary      Status: Abnormal   Collection Time: 11/02/19 11:50 PM  Result Value Ref Range   Glucose-Capillary 129 (H) 70 - 99 mg/dL  Glucose, capillary     Status: Abnormal   Collection Time: 11/03/19  4:29 AM  Result Value Ref Range   Glucose-Capillary 119 (H) 70 - 99 mg/dL  CBC     Status: Abnormal   Collection Time: 11/03/19  4:52 AM  Result Value Ref Range   WBC 12.9 (H) 4.0 - 10.5 K/uL   RBC 3.11 (L) 3.87 - 5.11 MIL/uL   Hemoglobin 9.8 (L) 12.0 - 15.0 g/dL   HCT 29.8 (L) 36 - 46 %   MCV 95.8 80.0 - 100.0 fL   MCH 31.5 26.0 - 34.0 pg   MCHC 32.9 30.0 - 36.0 g/dL   RDW 19.8 (H) 11.5 - 15.5 %   Platelets 221 150 - 400 K/uL   nRBC 0.2 0.0 - 0.2 %  Basic metabolic panel     Status: Abnormal   Collection Time: 11/03/19  4:52 AM  Result Value Ref Range   Sodium 131 (L) 135 - 145 mmol/L   Potassium 3.7 3.5 - 5.1 mmol/L   Chloride 101 98 - 111 mmol/L   CO2 22 22 - 32 mmol/L   Glucose, Bld 111 (H) 70 - 99 mg/dL   BUN 10 6 - 20 mg/dL   Creatinine, Ser <0.30 (L) 0.44 - 1.00 mg/dL   Calcium 7.5 (L) 8.9 - 10.3 mg/dL  GFR calc non Af Amer NOT CALCULATED >60 mL/min   GFR calc Af Amer NOT CALCULATED >60 mL/min   Anion gap 8 5 - 15  Glucose, capillary     Status: Abnormal   Collection Time: 11/03/19  8:50 AM  Result Value Ref Range   Glucose-Capillary 126 (H) 70 - 99 mg/dL    Assessment & Plan: The plan of care was discussed with the bedside nurse for the day, who is in agreement with this plan and no additional concerns were raised.   Present on Admission: . (Resolved) Hyponatremia . Crohn's disease of large intestine with other complication ([place]) . Malnutrition of moderate degree ([place]) . Pressure injury of skin . Peritoneal hematoma . Renal infarction ([place])    LOS: 15 days   Additional comments:I reviewed the patient's new clinical lab test results.   and I reviewed the patients new imaging test results.    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 Kae Heller 7/24. POD#8.  - now with large mesenteric hematoma. Leave alone, should tamponade.  See Dr. Pascal Lux note with IR for full explanation.  hemodynamically stable without blood products for over 24 hours. Given risk of embolic events and patients current hypercoagulable state with known infra-renal aortic thrombus, would recommend restarting IV heparin without a bolus. -TNA,  FLD, although may have an ileus/early satiety due to hematoma - BID WD dressing changes.  Some fascial separation.  Continue to monitor -cont JP drain and monitor output, it is more bloody compared to when I saw her Friday  -cont q 6h CBC -WBC downtrending - encourage patient to get OOB and perform regular exercises in the bed Sepsis- per primary team, off pressors  Acute hypoxic respiratory failure Protein calorie malnutrition - prealbumibn 8/2 14.2  Acute on chronic anemia- s/p 3u pRBC 7/30-8/1, stable this AM, follow  Volume overload - ongoing diuresis per primary   FEN: TNA, FLD + ensure ID: [id number] VTE: SCD's, ok to resume hep gtt without bolus from [facility] perspective; would also recommend notifying vascular surgery of her bleed, cessation of anticoagulation, and repeat imaging being done today so that they can make recommendations moving forward. Foley: continue for accurate I/O Dispo: ICU, stable for transfer OOU from surgical perspective.     Obie Dredge, PA-C Trauma & General Surgery Please use [email or web address] to contact on call provider  11/03/2019

## 2019-11-03 NOTE — Progress Notes (Signed)
Had discussion with patient and patient' daughter about restarting heparin. Discussed risks and benefits of restarting- Can prevent worsening of thrombi already present, but, could worsen hematoma and cause further bleeding elsewhere. And discussed the risks and benefits of not restarting the heparin- Additional thrombotic events could happen, but, would not increase risk of continued bleeding. Patient understood the risks and benefits and all questions asked where answered. The patient and her daughter have decided they want to hold off on restarting the heparin until after the repeat CT abdomin/pelvis with contrast is done and the patient sees PT again in the morning. At that point the team and patient will readdress the issue.

## 2019-11-03 NOTE — Progress Notes (Signed)
Physical Therapy Treatment Patient Details Name: Natasha Barrera MRN: [id number] DOB: May 18, 1965 Today's Date: 11/03/2019    History of Present Illness Natasha Barrera is a 54 year old female with a past medical history significant for hypertension and diverticulitis (status post sigmoid colectomy 2015) who originally presented with complaints of lower extremity edema with later reports of abdominal pain and decreased appetite.  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    PT Comments    Pt needed encouragement to overcome her anxiety and to participate, but unable to get pt to agree with getting OOB to the recliner.  Emphasis on exercise for Upper and LE's, transition to EOB, sitting balance,  Sit to stand and stepping up to [facility].  In lieu of not getting OOB to chair, pt placed in upright sitting position in the bed.    Follow Up Recommendations  CIR     Equipment Recommendations  Rolling walker with 5" wheels;3in1 (PT)    Recommendations for Other Services Rehab consult     Precautions / Restrictions Precautions Precautions: Fall Precaution Comments: JP drain R flank, colostomy    Mobility  Bed Mobility Overal bed mobility: Needs Assistance Bed Mobility: Rolling;Sidelying to Sit Rolling: Min assist Sidelying to sit: Min assist     Sit to sidelying: Mod assist General bed mobility comments: cues for direction and sequencing.  minimal truncal assist, moderate assist at LE's to get into bed.  Transfers Overall transfer level: Needs assistance   Transfers: Sit to/from Stand Sit to Stand: Mod assist         General transfer comment: cues for hand placement and where to hold for face to face standing assist.  Light mod assist to come forward and  boos.  Ambulation/Gait Ambulation/Gait assistance: Min assist Gait Distance (Feet): 3 Feet   Gait Pattern/deviations: Step-to pattern     General Gait Details: face to face side stepping toward HOB.   Stairs             Wheelchair Mobility    Modified Rankin (Stroke Patients Only)       Balance Overall balance assessment: Needs assistance   Sitting balance-Leahy Scale: Fair       Standing balance-Leahy Scale: Poor Standing balance comment: reliant on external support.                            Cognition Arousal/Alertness: Awake/alert Behavior During Therapy: Anxious Overall Cognitive Status: Within Functional Limits for tasks assessed                                        Exercises General Exercises - Lower Extremity Ankle Circles/Pumps: AROM;Supine;Both;20 reps Short Arc Quad: AROM;Both;10 reps;Supine Heel Slides: Both;Supine;10 reps (holds for 5 secs) Other Exercises Other Exercises: AROM at elbows and shoulders    General Comments General comments (skin integrity, edema, etc.): EHR 133 Max  110's and 120's with anxious.  pt needed encouragement to get the most out of her.      Pertinent Vitals/Pain Pain Assessment: Faces Faces Pain Scale: Hurts even more Pain Location: abdomen with mobility Pain Descriptors / Indicators: Operative site guarding Pain Intervention(s): Monitored during session  Home Living                      Prior Function            PT Goals (current goals can now be found in the care plan section) Acute Rehab PT Goals Patient Stated Goal: to have less pain and less swelling in legs  PT Goal Formulation: With patient Time For Goal Achievement: 11/09/19 Potential to Achieve Goals: Good Progress towards PT goals: Progressing toward goals    Frequency    Min 3X/week      PT Plan Current plan remains appropriate    Co-evaluation PT/OT/SLP Co-Evaluation/Treatment:  Yes Reason for Co-Treatment: For patient/therapist safety;Necessary to address cognition/behavior during functional activity PT goals addressed during session: Mobility/safety with mobility        AM-PAC PT "6 Clicks" Mobility   Outcome Measure  Help needed turning from your back to your side while in a flat bed without using bedrails?: A Little Help needed moving from lying on your back to sitting on the side of a flat bed without using bedrails?: A Little Help needed moving to and from a bed to a chair (including a wheelchair)?: A Lot Help needed standing up from a chair using your arms (e.g., wheelchair or bedside chair)?: A Lot Help needed to walk in hospital room?: A Lot Help needed climbing 3-5 steps with a railing? : Total 6 Click Score: 13    End of Session   Activity Tolerance: Patient limited by fatigue;Other (comment) (self limiting behaviors) Patient left: with call bell/phone within reach;in bed;Other (comment) (in full chair position in the bed.) Nurse Communication: Mobility status PT Visit Diagnosis: Other abnormalities of gait and mobility (R26.89);Muscle weakness (generalized) (M62.81)     Time: 1610-9604 PT Time Calculation (min) (ACUTE ONLY): 33 min  Charges:  $Therapeutic Activity: 8-22 mins                     11/03/2019  Ginger Carne., PT Acute Rehabilitation Services [phone]  (pager) [phone]  (office)   Natasha Barrera 11/03/2019, 1:45 PM

## 2019-11-03 NOTE — Progress Notes (Addendum)
   VASCULAR SURGERY ASSESSMENT & PLAN:   INFRARENAL AORTIC THROMBUS: This patient was seen in consultation by Dr. Carlis Abbott on 10/25/2019.  The patient has a history of diverticulitis and is status post colectomy.  She was diagnosed with Crohn's colitis and this admission underwent partial colectomy and colostomy on 10/24/2019.  A CT scan showed some thrombus in the infrarenal aorta.  There was less than 50% luminal involvement and there was underlying aortoiliac calcific disease.  She had no evidence of embolic disease.  Heparin was recommended.  Recently she has had some bleeding issues and the heparin has been held.  She is for a follow-up CT scan tomorrow.  I think from a vascular standpoint given her bleeding issues recently it is reasonable to hold her heparin for now.  We can restart this gently once the bleeding issue is resolved.   SUBJECTIVE:   No specific complaints.  PHYSICAL EXAM:   Vitals:   11/03/19 0330 11/03/19 0428 11/03/19 0819 11/03/19 1514  BP: 114/65  116/69 120/68  Pulse: 74  64 73  Resp: 16  20   Temp:  97.8 F (36.6 C) 98.2 F (36.8 C) 98.5 F (36.9 C)  TempSrc:  Oral Oral Oral  SpO2: 95%  94%   Weight:      Height:       Her abdominal dressing is dry. Her urine is clear now without evidence of bleeding.  LABS:   Lab Results  Component Value Date   WBC 12.9 (H) 11/03/2019   HGB 9.8 (L) 11/03/2019   HCT 29.8 (L) 11/03/2019   MCV 95.8 11/03/2019   PLT 221 11/03/2019   Lab Results  Component Value Date   CREATININE <0.30 (L) 11/03/2019   Lab Results  Component Value Date   INR 1.2 10/31/2019   CBG (last 3)  Recent Labs    11/03/19 0850 11/03/19 1151 11/03/19 1525  GLUCAP 126*  126* 140* 121*    PROBLEM LIST:    Principal Problem:   Colon perforation ([facility]) Active Problems:   Crohn's disease of large intestine with other complication ([place])   Malnutrition of moderate degree ([facility])   Pressure injury of skin   Peritoneal hematoma   Renal  infarction ([place])   Acute blood loss anemia   CURRENT MEDS:   . Chlorhexidine Gluconate Cloth  6 each Topical Daily  . feeding supplement (ENSURE ENLIVE)  237 mL Oral TID PC & HS  . furosemide  40 mg Intravenous BID  . insulin aspart  0-9 Units Subcutaneous Q4H  . methocarbamol  1,000 mg Oral TID  . methylPREDNISolone (SOLU-MEDROL) injection  40 mg Intravenous Q12H  . pantoprazole  40 mg Oral Q0600  . sodium chloride flush  10-40 mL Intracatheter Q12H  . sodium chloride flush  3 mL Intravenous Once  . vitamin A  10,000 Units Oral Daily    Deitra Mayo Office: [phone] 11/03/2019

## 2019-11-04 ENCOUNTER — Inpatient Hospital Stay (HOSPITAL_COMMUNITY): Payer: 59 | Admitting: Anesthesiology

## 2019-11-04 ENCOUNTER — Inpatient Hospital Stay (HOSPITAL_COMMUNITY): Payer: 59

## 2019-11-04 ENCOUNTER — Encounter (HOSPITAL_COMMUNITY)
Admission: EM | Disposition: A | Discharge status: Rehabilitation Facility | Payer: Self-pay | Source: Home / Self Care | Attending: Student in an Organized Health Care Education/Training Program

## 2019-11-04 ENCOUNTER — Encounter (HOSPITAL_COMMUNITY): Payer: Self-pay | Admitting: Pulmonary Disease

## 2019-11-04 DIAGNOSIS — R7881 Bacteremia: Secondary | ICD-10-CM

## 2019-11-04 HISTORY — PX: LAPAROTOMY: SHX154

## 2019-11-04 HISTORY — PX: APPLICATION OF WOUND VAC: SHX5189

## 2019-11-04 LAB — BLOOD CULTURE ID PANEL (REFLEXED) - BCID2

## 2019-11-04 LAB — GLUCOSE, CAPILLARY
Glucose-Capillary: 103 mg/dL — ABNORMAL HIGH (ref 70–99)
Glucose-Capillary: 107 mg/dL — ABNORMAL HIGH (ref 70–99)
Glucose-Capillary: 130 mg/dL — ABNORMAL HIGH (ref 70–99)
Glucose-Capillary: 107 mg/dL — ABNORMAL HIGH (ref 70–99)
Glucose-Capillary: 95 mg/dL (ref 70–99)

## 2019-11-04 LAB — CBC WITH DIFFERENTIAL/PLATELET
Abs Immature Granulocytes: 0.08 10*3/uL — ABNORMAL HIGH (ref 0.00–0.07)
Basophils Absolute: 0 10*3/uL (ref 0.0–0.1)
Basophils Relative: 0 %
Eosinophils Absolute: 0 10*3/uL (ref 0.0–0.5)
Eosinophils Relative: 0 %
HCT: 29.9 % — ABNORMAL LOW (ref 36.0–46.0)
Hemoglobin: 9.8 g/dL — ABNORMAL LOW (ref 12.0–15.0)
Immature Granulocytes: 1 %
Lymphocytes Relative: 4 %
Lymphs Abs: 0.5 10*3/uL — ABNORMAL LOW (ref 0.7–4.0)
MCH: 31.3 pg (ref 26.0–34.0)
MCHC: 32.8 g/dL (ref 30.0–36.0)
MCV: 95.5 fL (ref 80.0–100.0)
Monocytes Absolute: 0.6 10*3/uL (ref 0.1–1.0)
Monocytes Relative: 5 %
Neutro Abs: 10.2 10*3/uL — ABNORMAL HIGH (ref 1.7–7.7)
Neutrophils Relative %: 90 %
Platelets: 251 10*3/uL (ref 150–400)
RBC: 3.13 MIL/uL — ABNORMAL LOW (ref 3.87–5.11)
RDW: 19.9 % — ABNORMAL HIGH (ref 11.5–15.5)
WBC: 11.3 10*3/uL — ABNORMAL HIGH (ref 4.0–10.5)
nRBC: 0 % (ref 0.0–0.2)

## 2019-11-04 LAB — TYPE AND SCREEN
ABO/RH(D): O POS
Antibody Screen: NEGATIVE

## 2019-11-04 LAB — COMPREHENSIVE METABOLIC PANEL
ALT: 238 U/L — ABNORMAL HIGH (ref 0–44)
AST: 48 U/L — ABNORMAL HIGH (ref 15–41)
Albumin: 1.6 g/dL — ABNORMAL LOW (ref 3.5–5.0)
Alkaline Phosphatase: 159 U/L — ABNORMAL HIGH (ref 38–126)
Anion gap: 10 (ref 5–15)
BUN: 13 mg/dL (ref 6–20)
CO2: 26 mmol/L (ref 22–32)
Calcium: 7.8 mg/dL — ABNORMAL LOW (ref 8.9–10.3)
Chloride: 98 mmol/L (ref 98–111)
Creatinine, Ser: 0.43 mg/dL — ABNORMAL LOW (ref 0.44–1.00)
GFR calc Af Amer: >60 mL/min (ref >60)
GFR calc non Af Amer: >60 mL/min (ref >60)
Glucose, Bld: 127 mg/dL — ABNORMAL HIGH (ref 70–99)
Potassium: 3.6 mmol/L (ref 3.5–5.1)
Sodium: 134 mmol/L — ABNORMAL LOW (ref 135–145)
Total Bilirubin: 0.7 mg/dL (ref 0.3–1.2)
Total Protein: 4.4 g/dL — ABNORMAL LOW (ref 6.5–8.1)

## 2019-11-04 LAB — PHOSPHORUS: Phosphorus: 3.9 mg/dL (ref 2.5–4.6)

## 2019-11-04 LAB — MAGNESIUM: Magnesium: 2.1 mg/dL (ref 1.7–2.4)

## 2019-11-04 LAB — AMMONIA: Ammonia: 28 umol/L (ref 9–35)

## 2019-11-04 LAB — LACTIC ACID, PLASMA: Lactic Acid, Venous: 1.4 mmol/L (ref 0.5–1.9)

## 2019-11-04 SURGERY — LAPAROTOMY, EXPLORATORY
Anesthesia: General | Site: Abdomen

## 2019-11-04 MED ORDER — SODIUM CHLORIDE 0.9 % IV SOLN
2.0000 g | INTRAVENOUS | Status: DC
  Administered 2019-11-04 – 2019-11-05 (×2): 2 g via INTRAVENOUS
  Filled 2019-11-04 (×2): qty 20

## 2019-11-04 MED ORDER — ENOXAPARIN SODIUM 40 MG/0.4ML ~~LOC~~ SOLN
40.0000 mg | SUBCUTANEOUS | Status: DC
  Administered 2019-11-05 – 2019-11-10 (×6): 40 mg via SUBCUTANEOUS
  Filled 2019-11-04 (×6): qty 0.4

## 2019-11-04 MED ORDER — FENTANYL CITRATE (PF) 100 MCG/2ML IJ SOLN
25.0000 ug | INTRAMUSCULAR | Status: DC | PRN
  Administered 2019-11-04 (×2): 50 ug via INTRAVENOUS

## 2019-11-04 MED ORDER — PROPOFOL 10 MG/ML IV BOLUS
INTRAVENOUS | Status: AC
  Filled 2019-11-04: qty 20

## 2019-11-04 MED ORDER — STERILE WATER FOR IRRIGATION IR SOLN
Status: DC | PRN
  Administered 2019-11-04: 1000 mL

## 2019-11-04 MED ORDER — IOHEXOL 300 MG/ML  SOLN
100.0000 mL | Freq: Once | INTRAMUSCULAR | Status: AC | PRN
  Administered 2019-11-04: 100 mL via INTRAVENOUS

## 2019-11-04 MED ORDER — ENOXAPARIN SODIUM 40 MG/0.4ML ~~LOC~~ SOLN
40.0000 mg | SUBCUTANEOUS | Status: DC
  Filled 2019-11-04: qty 0.4

## 2019-11-04 MED ORDER — DEXAMETHASONE SODIUM PHOSPHATE 4 MG/ML IJ SOLN
INTRAMUSCULAR | Status: DC | PRN
Start: 2019-11-04 — End: 2019-11-04
  Administered 2019-11-04: 4 mg via INTRAVENOUS

## 2019-11-04 MED ORDER — FENTANYL CITRATE (PF) 100 MCG/2ML IJ SOLN
INTRAMUSCULAR | Status: AC
  Filled 2019-11-04: qty 2

## 2019-11-04 MED ORDER — MIDAZOLAM HCL 2 MG/2ML IJ SOLN
INTRAMUSCULAR | Status: DC | PRN
Start: 2019-11-04 — End: 2019-11-04
  Administered 2019-11-04: 2 mg via INTRAVENOUS

## 2019-11-04 MED ORDER — PHENYLEPHRINE HCL (PRESSORS) 10 MG/ML IV SOLN
INTRAVENOUS | Status: DC | PRN
Start: 2019-11-04 — End: 2019-11-04
  Administered 2019-11-04: 80 ug via INTRAVENOUS

## 2019-11-04 MED ORDER — LACTATED RINGERS IV SOLN: INTRAVENOUS | Status: DC | PRN

## 2019-11-04 MED ORDER — ALBUMIN HUMAN 5 % IV SOLN
INTRAVENOUS | Status: DC | PRN
Start: 2019-11-04 — End: 2019-11-04

## 2019-11-04 MED ORDER — DEXAMETHASONE SODIUM PHOSPHATE 10 MG/ML IJ SOLN
INTRAMUSCULAR | Status: DC | PRN
  Administered 2019-11-04: 10 mg via INTRAVENOUS

## 2019-11-04 MED ORDER — SUGAMMADEX SODIUM 200 MG/2ML IV SOLN
INTRAVENOUS | Status: DC | PRN
Start: 2019-11-04 — End: 2019-11-04
  Administered 2019-11-04: 200 mg via INTRAVENOUS

## 2019-11-04 MED ORDER — MIDAZOLAM HCL 2 MG/2ML IJ SOLN
INTRAMUSCULAR | Status: AC
  Filled 2019-11-04: qty 2

## 2019-11-04 MED ORDER — SUCCINYLCHOLINE 20MG/ML (10ML) SYRINGE FOR MEDFUSION PUMP - OPTIME
INTRAMUSCULAR | Status: DC | PRN
  Administered 2019-11-04: 100 mg via INTRAVENOUS

## 2019-11-04 MED ORDER — ROCURONIUM 10MG/ML (10ML) SYRINGE FOR MEDFUSION PUMP - OPTIME
INTRAVENOUS | Status: DC | PRN
Start: 2019-11-04 — End: 2019-11-04
  Administered 2019-11-04: 40 mg via INTRAVENOUS

## 2019-11-04 MED ORDER — LIDOCAINE 2% (20 MG/ML) 5 ML SYRINGE
INTRAMUSCULAR | Status: AC
  Filled 2019-11-04: qty 5

## 2019-11-04 MED ORDER — HYDROMORPHONE HCL 1 MG/ML IJ SOLN
1.0000 mg | INTRAMUSCULAR | Status: DC | PRN
  Administered 2019-11-05: 0.5 mg via INTRAVENOUS
  Filled 2019-11-04: qty 1

## 2019-11-04 MED ORDER — LACTATED RINGERS IV SOLN: INTRAVENOUS | Status: DC

## 2019-11-04 MED ORDER — ONDANSETRON HCL 4 MG/2ML IJ SOLN: 4.0000 mg | Freq: Once | INTRAMUSCULAR | Status: DC | PRN

## 2019-11-04 MED ORDER — FENTANYL CITRATE (PF) 250 MCG/5ML IJ SOLN
INTRAMUSCULAR | Status: DC | PRN
  Administered 2019-11-04 (×3): 50 ug via INTRAVENOUS

## 2019-11-04 MED ORDER — TRACE MINERALS CU-MN-SE-ZN 300-55-60-3000 MCG/ML IV SOLN
INTRAVENOUS | Status: AC
  Filled 2019-11-04: qty 604.8

## 2019-11-04 MED ORDER — METHYLPREDNISOLONE SODIUM SUCC 40 MG IJ SOLR
30.0000 mg | Freq: Two times a day (BID) | INTRAMUSCULAR | Status: DC
  Administered 2019-11-05 – 2019-11-06 (×3): 30 mg via INTRAVENOUS
  Filled 2019-11-04 (×4): qty 1

## 2019-11-04 MED ORDER — HEMOSTATIC AGENTS (NO CHARGE) OPTIME
TOPICAL | Status: DC | PRN
  Administered 2019-11-04: 1 via TOPICAL

## 2019-11-04 MED ORDER — LIDOCAINE HCL 1 % IJ SOLN
INTRAMUSCULAR | Status: DC | PRN
  Administered 2019-11-04: 60 mg via INTRADERMAL

## 2019-11-04 MED ORDER — PROPOFOL 10 MG/ML IV BOLUS
INTRAVENOUS | Status: DC | PRN
Start: 2019-11-04 — End: 2019-11-04
  Administered 2019-11-04: 80 mg via INTRAVENOUS

## 2019-11-04 MED ORDER — FENTANYL CITRATE (PF) 250 MCG/5ML IJ SOLN
INTRAMUSCULAR | Status: AC
  Filled 2019-11-04: qty 5

## 2019-11-04 MED ORDER — CHLORHEXIDINE GLUCONATE 0.12 % MT SOLN
15.0000 mL | OROMUCOSAL | Status: AC
  Administered 2019-11-04: 15 mL via OROMUCOSAL
  Filled 2019-11-04: qty 15

## 2019-11-04 MED ORDER — 0.9 % SODIUM CHLORIDE (POUR BTL) OPTIME
TOPICAL | Status: DC | PRN
  Administered 2019-11-04 (×4): 1000 mL

## 2019-11-04 SURGICAL SUPPLY — 46 items
APL PRP STRL LF DISP 70% ISPRP (MISCELLANEOUS) ×1
APL SKNCLS STERI-STRIP NONHPOA (GAUZE/BANDAGES/DRESSINGS) ×1
BENZOIN TINCTURE PRP APPL 2/3 (GAUZE/BANDAGES/DRESSINGS) ×3 IMPLANT
BIOPATCH RED 1 DISK 7.0 (GAUZE/BANDAGES/DRESSINGS) ×2 IMPLANT
BIOPATCH RED 1IN DISK 7.0MM (GAUZE/BANDAGES/DRESSINGS) ×1
CANISTER WOUND CARE 500ML ATS (WOUND CARE) ×3 IMPLANT
CATH ROBINSON RED A/P 14FR (CATHETERS) ×3 IMPLANT
CHLORAPREP W/TINT 26 (MISCELLANEOUS) ×3 IMPLANT
COVER SURGICAL LIGHT HANDLE (MISCELLANEOUS) ×3 IMPLANT
COVER WAND RF STERILE (DRAPES) ×3 IMPLANT
DRAPE LAPAROSCOPIC ABDOMINAL (DRAPES) ×3 IMPLANT
DRAPE WARM FLUID 44X44 (DRAPES) ×3 IMPLANT
DRSG VAC ATS MED SENSATRAC (GAUZE/BANDAGES/DRESSINGS) ×3 IMPLANT
ELECT CAUTERY BLADE 6.4 (BLADE) ×9 IMPLANT
ELECT REM PT RETURN 9FT ADLT (ELECTROSURGICAL) ×3
ELECTRODE REM PT RTRN 9FT ADLT (ELECTROSURGICAL) ×1 IMPLANT
EVACUATOR SILICONE 100CC (DRAIN) ×3 IMPLANT
GAUZE SPONGE 4X4 12PLY STRL (GAUZE/BANDAGES/DRESSINGS) ×3 IMPLANT
GLOVE BIO SURGEON STRL SZ7 (GLOVE) ×3 IMPLANT
GLOVE BIOGEL PI IND STRL 7.5 (GLOVE) ×1 IMPLANT
GLOVE BIOGEL PI INDICATOR 7.5 (GLOVE) ×2
GLOVE ECLIPSE 6.5 STRL STRAW (GLOVE) ×3 IMPLANT
GOWN STRL REUS W/ TWL LRG LVL3 (GOWN DISPOSABLE) ×3 IMPLANT
GOWN STRL REUS W/TWL LRG LVL3 (GOWN DISPOSABLE) ×9
HANDLE SUCTION POOLE (INSTRUMENTS) ×1 IMPLANT
KIT BASIN OR (CUSTOM PROCEDURE TRAY) ×3 IMPLANT
KIT OSTOMY DRAINABLE 2.75 STR (WOUND CARE) ×3 IMPLANT
KIT TURNOVER KIT B (KITS) ×3 IMPLANT
NS IRRIG 1000ML POUR BTL (IV SOLUTION) ×12 IMPLANT
PACK GENERAL/GYN (CUSTOM PROCEDURE TRAY) ×3 IMPLANT
PAD ARMBOARD 7.5X6 YLW CONV (MISCELLANEOUS) ×3 IMPLANT
PENCIL SMOKE EVACUATOR (MISCELLANEOUS) ×3 IMPLANT
POWDER SURGICEL 3.0 GRAM (HEMOSTASIS) ×3 IMPLANT
SPONGE LAP 18X18 RF (DISPOSABLE) ×3 IMPLANT
SUCTION POOLE HANDLE (INSTRUMENTS) ×3
SUT ETHILON 2 LR (SUTURE) ×12 IMPLANT
SUT NOVA NAB DX-16 0-1 5-0 T12 (SUTURE) ×6 IMPLANT
SUT PDS AB 1 TP1 96 (SUTURE) IMPLANT
SUT SILK 2 0 SH CR/8 (SUTURE) ×3 IMPLANT
SUT SILK 2 0 TIES 10X30 (SUTURE) ×3 IMPLANT
SUT SILK 3 0 SH CR/8 (SUTURE) ×3 IMPLANT
SUT SILK 3 0 TIES 10X30 (SUTURE) ×3 IMPLANT
SUT VIC AB 3-0 SH 18 (SUTURE) ×3 IMPLANT
TOWEL GREEN STERILE (TOWEL DISPOSABLE) ×3 IMPLANT
TRAY FOLEY MTR SLVR 16FR STAT (SET/KITS/TRAYS/PACK) ×3 IMPLANT
YANKAUER SUCT BULB TIP NO VENT (SUCTIONS) ×3 IMPLANT

## 2019-11-04 NOTE — Progress Notes (Signed)
VAST to pt's room to hang new bag of TPN. Pt being prepared to go to OR. Assessed PICC line and provided central line care. Held TPN and requested RN place IVT consult once patient returns from OR so that new TPN can be administered.

## 2019-11-04 NOTE — Progress Notes (Signed)
RN went in to reassess patient and upon assessment pt was AaOx1 (self) which was a change from previously noted AaOx4. Patient was confused and not making much sense when talking. She was refusing telemetry monitoring as well. RN called patients daughter to discuss any previous recent confusion and to try to help re-orient patient. RN paged on-call provider about AMS and provider came to bedside to assess patient. Family was updated by provider and RN. New orders placed and acknowledged. Will continue to monitor.

## 2019-11-04 NOTE — Progress Notes (Signed)
Patient ID: Natasha Barrera, female   DOB: 01-22-1966, 54 y.o.   MRN: [id number]  Surgery on-call  Patient's CT scan today showed enlarging fascial dehiscence with protruding transverse colon.  She also has a large right mesenteric hematoma as well as a fluid collection near the uterus that might be an abscess.  She needs immediate reexploration with probable placement of retention sutures.  Her steroids probably contributed to this dehiscence.  We will also try to evacuate the hematoma and pelvic abscess if we are able to safely dissect into the pelvis.  Discussed with patient and daughter by Dr. Redmond Pulling.  Imogene Burn. Georgette Dover, MD, [facility] Surgery  General/ Trauma Surgery   11/04/2019 6:41 PM

## 2019-11-04 NOTE — Progress Notes (Signed)
Physical Therapy Treatment Patient Details Name: Natasha Barrera MRN: [id number] DOB: 1965-11-18 Today's Date: 11/04/2019    History of Present Illness Natasha Barrera is a 54 year old female with a past medical history significant for hypertension and diverticulitis (status post sigmoid colectomy 2015) who originally presented with complaints of lower extremity edema with later reports of abdominal pain and decreased appetite.  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    PT Comments    Pt nervous about mobility, but agreeable to attempt standing. Pt required min-mod assist for bed mobility, and could not come to standing with multiple attempts and max +2 today. Pt reports increased LE weakness and fatigue today. PT instructed pt in LE exercises for strengthening, and administered HEP for pt to perform when PT not present. Daughter at bedside, very supportive. Will continue to follow acutely.     Follow Up Recommendations  CIR;SNF     Equipment Recommendations  Rolling walker with 5" wheels;3in1 (PT)    Recommendations for Other Services Rehab consult     Precautions / Restrictions Precautions Precautions: Fall Precaution Comments: JP drain R flank, colostomy Restrictions Weight Bearing Restrictions: No    Mobility  Bed Mobility Overal bed mobility: Needs Assistance Bed Mobility: Rolling;Sidelying to Sit;Sit to Sidelying Rolling: Min assist Sidelying to sit: Mod assist;HOB elevated     Sit to sidelying: Mod assist;+2 for safety/equipment General bed mobility comments: Min assist for roll bilaterally for trunk translation and LE positioning, mod assist for trunk and LE management sidelying<>sit.  Transfers Overall transfer level: Needs  assistance Equipment used: Rolling walker (2 wheeled) Transfers: Sit to/from Stand Sit to Stand: Max assist;+2 physical assistance;+2 safety/equipment;From elevated surface         General transfer comment: Standing attempts x2, from elevated EOB. Max +2 and pt unable to power up through LEs to initiate stand, second attempt pt slid forward off EOB and PT/NT needed to boost pt trunk back to EOB.  Ambulation/Gait             General Gait Details: unable   Stairs             Wheelchair Mobility    Modified Rankin (Stroke Patients Only)       Balance Overall balance assessment: Needs assistance Sitting-balance support: Bilateral upper extremity supported;Feet supported Sitting balance-Leahy Scale: Fair         Standing balance comment: unable to come to standing this day                            Cognition Arousal/Alertness: Awake/alert Behavior During Therapy: Anxious Overall Cognitive Status: Within Functional Limits for tasks assessed                                 General Comments: Anxiety about mobility, requires very increased time to participate in mobility to mentally prepare self. Requires step-by-step instruction for mobility      Exercises General Exercises - Lower Extremity Ankle Circles/Pumps: AROM;Both;10 reps;Supine Quad Sets: AROM;Both;10 reps;Supine Long Arc Quad: AROM;Both;10 reps;Seated Heel Slides: AAROM;Both;10 reps;Supine Hip ABduction/ADduction: AAROM;Both;10 reps;Supine Straight Leg Raises: AAROM;Both;10 reps;Supine    General Comments        Pertinent Vitals/Pain  Pain Assessment: 0-10 Pain Score: 5  Pain Location: abdomen, with mobility and coughing Pain Descriptors / Indicators: Operative site guarding;Sore;Discomfort Pain Intervention(s): Limited activity within patient's tolerance;Monitored during session;Repositioned    Home Living                      Prior Function             PT Goals (current goals can now be found in the care plan section) Acute Rehab PT Goals Patient Stated Goal: to have less pain and less swelling in legs  PT Goal Formulation: With patient Time For Goal Achievement: 11/09/19 Potential to Achieve Goals: Good Progress towards PT goals: Progressing toward goals    Frequency    Min 3X/week      PT Plan Current plan remains appropriate    Co-evaluation              AM-PAC PT "6 Clicks" Mobility   Outcome Measure  Help needed turning from your back to your side while in a flat bed without using bedrails?: A Little Help needed moving from lying on your back to sitting on the side of a flat bed without using bedrails?: A Lot Help needed moving to and from a bed to a chair (including a wheelchair)?: Total Help needed standing up from a chair using your arms (e.g., wheelchair or bedside chair)?: Total Help needed to walk in hospital room?: Total Help needed climbing 3-5 steps with a railing? : Total 6 Click Score: 9    End of Session Equipment Utilized During Treatment: Gait belt Activity Tolerance: Patient limited by fatigue;Other (comment) (self limiting) Patient left: with call bell/phone within reach;in bed;Other (comment);with nursing/sitter in room;with family/visitor present (in full chair position in the bed.) Nurse Communication: Mobility status PT Visit Diagnosis: Other abnormalities of gait and mobility (R26.89);Muscle weakness (generalized) (M62.81)     Time: 0932-1000 PT Time Calculation (min) (ACUTE ONLY): 28 min  Charges:  $Therapeutic Exercise: 8-22 mins $Therapeutic Activity: 8-22 mins                    Domanick Cuccia E, PT Acute Rehabilitation Services Pager [phone]  Office [phone]  Lilliane Sposito D Elonda Husky 11/04/2019, 12:40 PM

## 2019-11-04 NOTE — Progress Notes (Addendum)
Asked by Dr. Ardis Hughs to look at CT scan because of concerns of potential transverse colon herniation on CT  I came up and assessed patient She has upper midline abdominal dehiscence.  Her transverse colon is exposed. It is intact.  No signs of fistula or necrosis      Discussed finding with patient and her daughter  Since we are only about 10 days out from her initial procedure I have recommended going back to the operating room with the on-call surgeon this evening for reexploration and abdominal closure with retention sutures to protect viscera  D/w dr Georgette Dover - on call surgeon.   Discussed that there is a potential for injuring surrounding structures such as causing an enterotomy that may require repair and/or bowel resection.  Discussed that she is still at high risk for hernia formation as well as additional wound issues and potentially fistula formation especially since she has been on steroids  The daughter had some general questions about other things which I answered She had questions about when we would resume subcutaneous Lovenox.  It was scheduled to resume this evening since her hgb has been stable for several days now.   I told her that we would probably resume it tomorrow morning  We also discussed that we will ask vascular surgery to evaluate her CT scan from today to weigh in on whether or not she needs to go back on systemic IV heparin  Daughter also had questions that her mom had been complaining of some cramps and did not know if it was due to overexertion with physical therapy and/or abdominal muscle cramps  I explained it could be multifactorial could be abdominal wall muscle as well as some intestinal cramps due to recent surgery.  But did stress the importance of working with PT, ambulating, spending time in chair to keep muscle strength up  The daughter also had concerns that when the patient gets switched to more toward oral pain medicine that the patient had a  bad experience with oral oxycodone as well as oral Dilaudid a few years ago with confusion  Discussed that we generally try to do multimodality pain control such as scheduled Tylenol, muscle relaxants and trying to limit opioids if possible to prevent side effects such as nausea, vomiting, confusion, constipation, addiction  All of their questions were asked and answered.  Leighton Ruff. Redmond Pulling, MD, FACS General, Bariatric, & Minimally Invasive Surgery [facility], [place]

## 2019-11-04 NOTE — Progress Notes (Addendum)
PHARMACY - PHYSICIAN COMMUNICATION CRITICAL VALUE ALERT - BLOOD CULTURE IDENTIFICATION (BCID)  Natasha Barrera is an 54 y.o. female who presented to [facility] on 10/18/2019 with a chief complaint of leg swelling  Assessment:  E coli bacteremia, WBC mildly elevated, renal function ok  Name of physician (or Provider) Contacted: Dr. Konrad Penta  Current antibiotics: None currently, previously on a week of Zosyn and a few days of Merrem  Changes to prescribed antibiotics recommended:  Start Ceftriaxone 2g IV q24h Needs repeat blood cultures   Results for orders placed or performed during the hospital encounter of 10/18/19  Blood Culture ID Panel (Reflexed) (Collected: 10/31/2019  4:32 AM)  Result Value Ref Range   Enterococcus faecalis NOT DETECTED NOT DETECTED   Enterococcus Faecium NOT DETECTED NOT DETECTED   Listeria monocytogenes NOT DETECTED NOT DETECTED   Staphylococcus species NOT DETECTED NOT DETECTED   Staphylococcus aureus (BCID) NOT DETECTED NOT DETECTED   Staphylococcus epidermidis NOT DETECTED NOT DETECTED   Staphylococcus lugdunensis NOT DETECTED NOT DETECTED   Streptococcus species NOT DETECTED NOT DETECTED   Streptococcus agalactiae NOT DETECTED NOT DETECTED   Streptococcus pneumoniae NOT DETECTED NOT DETECTED   Streptococcus pyogenes NOT DETECTED NOT DETECTED   A.calcoaceticus-baumannii NOT DETECTED NOT DETECTED   Bacteroides fragilis NOT DETECTED NOT DETECTED   Enterobacterales DETECTED (A) NOT DETECTED   Enterobacter cloacae complex NOT DETECTED NOT DETECTED   Escherichia coli DETECTED (A) NOT DETECTED   Klebsiella aerogenes NOT DETECTED NOT DETECTED   Klebsiella oxytoca NOT DETECTED NOT DETECTED   Klebsiella pneumoniae NOT DETECTED NOT DETECTED   Proteus species NOT DETECTED NOT DETECTED   Salmonella species NOT DETECTED NOT DETECTED   Serratia marcescens NOT DETECTED NOT DETECTED   Haemophilus influenzae NOT DETECTED NOT DETECTED   Neisseria meningitidis NOT  DETECTED NOT DETECTED   Pseudomonas aeruginosa NOT DETECTED NOT DETECTED   Stenotrophomonas maltophilia NOT DETECTED NOT DETECTED   Candida albicans NOT DETECTED NOT DETECTED   Candida auris NOT DETECTED NOT DETECTED   Candida glabrata NOT DETECTED NOT DETECTED   Candida krusei NOT DETECTED NOT DETECTED   Candida parapsilosis NOT DETECTED NOT DETECTED   Candida tropicalis NOT DETECTED NOT DETECTED   Cryptococcus neoformans/gattii NOT DETECTED NOT DETECTED   CTX-M ESBL NOT DETECTED NOT DETECTED   Carbapenem resistance IMP NOT DETECTED NOT DETECTED   Carbapenem resistance KPC NOT DETECTED NOT DETECTED   Carbapenem resistance NDM NOT DETECTED NOT DETECTED   Carbapenem resist OXA 48 LIKE NOT DETECTED NOT DETECTED   Carbapenem resistance VIM NOT DETECTED NOT DETECTED   Narda Bonds, PharmD, BCPS Clinical Pharmacist Phone: [phone]

## 2019-11-04 NOTE — Transfer of Care (Signed)
Immediate Anesthesia Transfer of Care Note  Patient: Natasha Barrera  Procedure(s) Performed: EXPLORATORY LAPAROTOMY FOR DEHISCENCE (N/A Abdomen) APPLICATION OF WOUND VAC (N/A Abdomen)  Patient Location: PACU  Anesthesia Type:General  Level of Consciousness: awake, oriented and patient cooperative  Airway & Oxygen Therapy: Patient Spontanous Breathing and Patient connected to face mask oxygen  Post-op Assessment: Report given to RN, Post -op Vital signs reviewed and stable and Patient moving all extremities X 4  Post vital signs: Reviewed and stable  Last Vitals:  Vitals Value Taken Time  BP 127/72 11/04/19 2056  Temp 37.1 C 11/04/19 2056  Pulse 104 11/04/19 2106  Resp 18 11/04/19 2106  SpO2 93 % 11/04/19 2106  Vitals shown include unvalidated device data.  Last Pain:  Vitals:   11/04/19 2100  TempSrc:   PainSc: Asleep      Patients Stated Pain Goal: 0 (83/77/93 9688)  Complications: No complications documented.

## 2019-11-04 NOTE — Progress Notes (Signed)
Anesthesiology Note:  Natasha Barrera is a 54 year old female with Crohn's colitis who is 10 days postop from an emergent exploratory laparotomy and Hartman's procedure for a perforated sigmoid colon.  She suffered a wound dehiscence today and was returned to the operating room and underwent exploratory laparotomy and closure of the fascial dehiscence and colostomy revision.  Prior to surgery she was lethargic but was awake and answered questions appropriately.  Oxygen saturation was 94% on room air  BP- 102/67 heart rate 77 respiratory rate 19.  She underwent the procedure and was extubated without difficulty.  In the recovery room she appeared lethargic and complained of abdominal pain with deep breathing.  VS: T- 37.1 BP- 125/82 HR- 105 RR- 22  Lungs- decreased BS bilaterally without wheezing or crackles  O2 sat 85% on 10 L nonrebreather mask.   CXR: R. Lower lung opacity C/W pleural effusion, lungs without other airspace disease.  Patient placed on BiPAP 10/5 Now on FiO2 0.5  O2 Sat now 98% patient awake and following commands without respiratory distress.  Impression: Inadequate respirations post-op due to pain, splinting and residual effects of anesthesia. Improved with BiPAP support.  Plan  1. Discussed with Dr. Sharon Seller, Internal Medicine. 2. Transfer to 5N Progressive, wean BiPAP as tolerated.  Roberts Gaudy, MD

## 2019-11-04 NOTE — Progress Notes (Signed)
Patient arrived in PACU on 6L simple mask with O2 sats 88-90%.  Right lung clear, left lung diminished.  Patient placed on 12L non-rebreather with O2 sats 90-94%.  Dr. Linna Caprice at bedside and port able chest x-ray done.  Dr. Linna Caprice would like to place patient on bipap while in PACU.  Respiratory notified.  Will continue to monitor.

## 2019-11-04 NOTE — Anesthesia Preprocedure Evaluation (Addendum)
Anesthesia Evaluation  Patient identified by MRN, date of birth, ID band Patient awake    Reviewed: Allergy & Precautions, NPO status , Patient's Chart, lab work & pertinent test results  Airway Mallampati: II  TM Distance: >3 FB     Dental  (+) Teeth Intact, Dental Advisory Given   Pulmonary former smoker,     + decreased breath sounds      Cardiovascular hypertension,  Rhythm:Regular Rate:Tachycardia     Neuro/Psych    GI/Hepatic   Endo/Other    Renal/GU      Musculoskeletal   Abdominal   Peds  Hematology   Anesthesia Other Findings Patient lethargic, answers questions appropriately  Reproductive/Obstetrics                             Anesthesia Physical Anesthesia Plan  ASA: III and emergent  Anesthesia Plan: General   Post-op Pain Management:    Induction: Intravenous, Cricoid pressure planned and Rapid sequence  PONV Risk Score and Plan: Ondansetron  Airway Management Planned: Oral ETT  Additional Equipment:   Intra-op Plan:   Post-operative Plan: Extubation in OR  Informed Consent: I have reviewed the patients History and Physical, chart, labs and discussed the procedure including the risks, benefits and alternatives for the proposed anesthesia with the patient or authorized representative who has indicated his/her understanding and acceptance.     Dental advisory given  Plan Discussed with: CRNA and Anesthesiologist  Anesthesia Plan Comments:         Anesthesia Quick Evaluation

## 2019-11-04 NOTE — Progress Notes (Addendum)
Daily Rounding Note  11/04/2019, 11:31 AM  LOS: 16 days   SUBJECTIVE:   Chief complaint: Crohns colitis  Tolerating some soft food but does not like hospital food and appetite is still depressed.      Ostomy output remains brown, not bloody.  No nausea.  Persistent bil abd pain, worse w activity, movement.   Pt relays no improvement in abd pain  OBJECTIVE:         Vital signs in last 24 hours:    Temp:  [97.7 F (36.5 C)-98.5 F (36.9 C)] 98.5 F (36.9 C) (08/04 0754) Pulse Rate:  [60-90] 90 (08/04 0754) Resp:  [18] 18 (08/04 0754) BP: (109-120)/(60-68) 118/65 (08/04 0754) SpO2:  [95 %-97 %] 97 % (08/04 0754) Last BM Date: 11/03/19 Filed Weights   10/18/19 1620 10/24/19 0452  Weight: 53.5 kg 77 kg   General: looks ill, pale, comfortable   Heart: RRR Chest: clear in front.  Persistent mucoid cough Abdomen: soft, tender diffusely.  Loose brown, not watery or bloody,  stool in ostomy.  Large bandage on mid/lower abdomen is CDI Extremities: some LE edema Neuro/Psych:  Oriented x 3, not confused.     Lab Results: Recent Labs    11/02/19 0521 11/03/19 0452 11/04/19 0136  WBC 12.4* 12.9* 11.3*  HGB 9.2* 9.8* 9.8*  HCT 28.2* 29.8* 29.9*  PLT 209 221 251   BMET Recent Labs    11/02/19 0521 11/03/19 0452 11/04/19 0136  NA 131* 131* 134*  K 3.6 3.7 3.6  CL 101 101 98  CO2 24 22 26   GLUCOSE 102* 111* 127*  BUN 10 10 13   CREATININE 0.32* <0.30* 0.43*  CALCIUM 7.5* 7.5* 7.8*   LFT Recent Labs    11/02/19 0521 11/04/19 0136  PROT 4.3* 4.4*  ALBUMIN 1.4* 1.6*  AST 88* 48*  ALT 135* 238*  ALKPHOS 150* 159*  BILITOT 0.7 0.7   PT/INR No results for input(s): LABPROT, INR in the last 72 hours. Hepatitis Panel No results for input(s): HEPBSAG, HCVAB, HEPAIGM, HEPBIGM in the last 72 hours.  Studies/Results: CT HEAD WO CONTRAST  Result Date: 11/04/2019 CLINICAL DATA:  Mental status change of unknown  cause. EXAM: CT HEAD WITHOUT CONTRAST TECHNIQUE: Contiguous axial images were obtained from the base of the skull through the vertex without intravenous contrast. COMPARISON:  None. FINDINGS: Brain: Diffuse cerebral atrophy. No mass effect or midline shift. No abnormal extra-axial fluid collections. Gray-Achey matter junctions are distinct. Basal cisterns are not effaced. No acute intracranial hemorrhage. Vascular: Intracranial arterial vascular calcifications are present. Skull: Normal. Negative for fracture or focal lesion. Sinuses/Orbits: No acute finding. Other: None. IMPRESSION: 1. No acute intracranial abnormalities. 2. Diffuse cerebral atrophy. Electronically Signed   By: Lucienne Capers M.D.   On: 11/04/2019 02:56    ASSESMENT:   *   Crohn's colitis 10/21/19 Colonoscopy: scarring, bridging thruout colon w rectal sparing, grossly favoring Crohn's dz. TI normal. 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 No granulomas or dysplasia. 10/21/19 EGD: distal esophageal ring, O/w normal. bxs of duodenum obtained, pndg. Path: No histopathologic changes, no increased intraepithelial lymphocytes or changes of villous architecture. Ongoing solumedrol.  Quant gold test indeterminate. TPMT testing of 7/25 still in progress.   *   Sigmoid perf.  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  2013 sigmoid colectomy for diverticulitis.   Enterobacter, E coli positive on  7/31 blood PCR path panel, no growth on clx after 4 days.   Zosyn x 6 d >> Vanc x 2 d >> both stopped.  Currently d 2 Rocephin.    *   Infrarenal aortic thrombus, bil renal infarcts.   IV heparin 7/25 - 7/30  *   Post op R peritoneal hematoma at which point IV heparin dicontinued.     *   Moderate PCM.  On TNA, soft diet, Ensure.    *    Macrocytic, now normocytic anemia.  Multifactorial.  Acute blood loss from RP hematoma  and (now resolved) blood per ostomy.    S/p 6 PRBCs, last on 8/1.    Hgb stable.    *   New elevation LFTs in setting of TNA.    *   Family reported confusion from baseline.  Head CT today with diffuse atrophy, nothing acute.   Current mental status is unremarkable      PLAN   *   Pt has fup CTAP scheduled for today.     Azucena Freed  11/04/2019, 11:31 AM Phone [phone]   ________________________________________________________________________  Velora Heckler GI MD note:  I personally examined the patient, reviewed the data and agree with the assessment and plan described above.  Severe Crohn's colitis, complicated by perforation 2 days after colonoscopy, abscess, arterial blood clots and large abdominal hematoma. She's been on IV solumdrol 40 Q12 for 2 weeks now.  May be having some confusion, delerium from the steroids and I think it is certainly safe to taper it a bit. I am going to change to 39m q12 hours starting today, may be able to taper further in the next few days. Goal is for her to be at least on biologics and probably also immunomodulator.  When to start those is going to be a big question and I'm not sure when it will be but certainly not now.   DOwens Loffler MD LPioneer Memorial Hospital And Health ServicesGastroenterology Pager [phone]

## 2019-11-04 NOTE — Progress Notes (Signed)
General Surgery Follow Up Note  Subjective:    Overnight Issues:  Confusion and agitation overnight, now improving. CT head negative for acute stroke. Patient reports abdominal discomfort is improving but she is hesitant to eat too much - states she would love a [place] sandwich. At baseline ate 1-2 small meals and a small snack prior to hospital admission. She is having colostomy output. Participating with therapies -- was unable to stand up from the bed this AM due to weakness. Endorses fatigue/generalized weakness and feeling like her legs are heavy. Denies CP, SOB, or urinary sxs.   Hgb/hct 9.8 from 9.2  Objective:  Vital signs for last 24 hours: Temp:  [97.7 F (36.5 C)-98.5 F (36.9 C)] 98.5 F (36.9 C) (08/04 0754) Pulse Rate:  [60-90] 90 (08/04 0754) Resp:  [18] 18 (08/04 0754) BP: (109-120)/(60-68) 118/65 (08/04 0754) SpO2:  [95 %-97 %] 97 % (08/04 0754)  Hemodynamic parameters for last 24 hours:    Intake/Output from previous day: 08/03 0701 - 08/04 0700 In: 951.9 [P.O.:360; I.V.:591.9] Out: 3785 [Urine:3300; Drains:85; Stool:400]  Intake/Output this shift: No intake/output data recorded.  Vent settings for last 24 hours:    Physical Exam:  Gen: comfortable, no distress Neuro: non-focal exam HEENT: PERRL Neck: supple CV: RRR Pulm: unlabored breathing Abd: soft, appropriately TTP, midline wound dressing c/d/i, JP with sanguinous output (85 cc/24h), ostomy productive with non-bloody stool GU: clear yellow urine; 3,300cc/24h Extr: wwp, pitting bilateral lower extremity edema to mid-shin    Results for orders placed or performed during the hospital encounter of 10/18/19 (from the past 24 hour(s))  Glucose, capillary     Status: Abnormal   Collection Time: 11/03/19 11:51 AM  Result Value Ref Range   Glucose-Capillary 140 (H) 70 - 99 mg/dL   Comment 1 Notify RN    Comment 2 Document in Chart   Glucose, capillary     Status: Abnormal   Collection Time:  11/03/19  3:25 PM  Result Value Ref Range   Glucose-Capillary 121 (H) 70 - 99 mg/dL   Comment 1 Notify RN    Comment 2 Document in Chart   Glucose, capillary     Status: Abnormal   Collection Time: 11/03/19  8:46 PM  Result Value Ref Range   Glucose-Capillary 123 (H) 70 - 99 mg/dL  Glucose, capillary     Status: Abnormal   Collection Time: 11/03/19 11:21 PM  Result Value Ref Range   Glucose-Capillary 122 (H) 70 - 99 mg/dL  CBC with Differential/Platelet     Status: Abnormal   Collection Time: 11/04/19  1:36 AM  Result Value Ref Range   WBC 11.3 (H) 4.0 - 10.5 K/uL   RBC 3.13 (L) 3.87 - 5.11 MIL/uL   Hemoglobin 9.8 (L) 12.0 - 15.0 g/dL   HCT 29.9 (L) 36 - 46 %   MCV 95.5 80.0 - 100.0 fL   MCH 31.3 26.0 - 34.0 pg   MCHC 32.8 30.0 - 36.0 g/dL   RDW 19.9 (H) 11.5 - 15.5 %   Platelets 251 150 - 400 K/uL   nRBC 0.0 0.0 - 0.2 %   Neutrophils Relative % 90 %   Neutro Abs 10.2 (H) 1.7 - 7.7 K/uL   Lymphocytes Relative 4 %   Lymphs Abs 0.5 (L) 0.7 - 4.0 K/uL   Monocytes Relative 5 %   Monocytes Absolute 0.6 0 - 1 K/uL   Eosinophils Relative 0 %   Eosinophils Absolute 0.0 0 - 0 K/uL  Basophils Relative 0 %   Basophils Absolute 0.0 0 - 0 K/uL   Immature Granulocytes 1 %   Abs Immature Granulocytes 0.08 (H) 0.00 - 0.07 K/uL   Polychromasia PRESENT   Lactic acid, plasma     Status: None   Collection Time: 11/04/19  1:36 AM  Result Value Ref Range   Lactic Acid, Venous 1.4 0.5 - 1.9 mmol/L  Comprehensive metabolic panel     Status: Abnormal   Collection Time: 11/04/19  1:36 AM  Result Value Ref Range   Sodium 134 (L) 135 - 145 mmol/L   Potassium 3.6 3.5 - 5.1 mmol/L   Chloride 98 98 - 111 mmol/L   CO2 26 22 - 32 mmol/L   Glucose, Bld 127 (H) 70 - 99 mg/dL   BUN 13 6 - 20 mg/dL   Creatinine, Ser 0.43 (L) 0.44 - 1.00 mg/dL   Calcium 7.8 (L) 8.9 - 10.3 mg/dL   Total Protein 4.4 (L) 6.5 - 8.1 g/dL   Albumin 1.6 (L) 3.5 - 5.0 g/dL   AST 48 (H) 15 - 41 U/L   ALT 238 (H) 0 - 44  U/L   Alkaline Phosphatase 159 (H) 38 - 126 U/L   Total Bilirubin 0.7 0.3 - 1.2 mg/dL   GFR calc non Af Amer >60 >60 mL/min   GFR calc Af Amer >60 >60 mL/min   Anion gap 10 5 - 15  Magnesium     Status: None   Collection Time: 11/04/19  1:36 AM  Result Value Ref Range   Magnesium 2.1 1.7 - 2.4 mg/dL  Phosphorus     Status: None   Collection Time: 11/04/19  1:36 AM  Result Value Ref Range   Phosphorus 3.9 2.5 - 4.6 mg/dL  Ammonia     Status: None   Collection Time: 11/04/19  1:36 AM  Result Value Ref Range   Ammonia 28 9 - 35 umol/L  Glucose, capillary     Status: Abnormal   Collection Time: 11/04/19  4:23 AM  Result Value Ref Range   Glucose-Capillary 103 (H) 70 - 99 mg/dL  Glucose, capillary     Status: Abnormal   Collection Time: 11/04/19  7:59 AM  Result Value Ref Range   Glucose-Capillary 107 (H) 70 - 99 mg/dL    Assessment & Plan: The plan of care was discussed with the bedside nurse for the day, who is in agreement with this plan and no additional concerns were raised.   Present on Admission:  (Resolved) Hyponatremia  Crohn's disease of large intestine with other complication (HCC)  Malnutrition of moderate degree (HCC)  Pressure injury of skin  Peritoneal hematoma  Renal infarction ([place])    LOS: 16 days   Additional comments:I reviewed the patient's new clinical lab test results.   and I reviewed the patients new imaging test results.    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 Kae Heller 7/24. POD#8.  - now with large mesenteric hematoma. Leave alone, should tamponade.  See Dr. Pascal Lux note with IR for full explanation.  hemodynamically stable without blood products for over 48 hours. Recommend resuming IV heparin gtt today without bolus. -  Advance to  SOFT diet. - BID WD dressing changes.  Some fascial separation.  I will perform wound check tomorrow AM with  RN -cont JP drain - will follow results of abd CT today.   Sepsis- per primary team, off pressors  Acute hypoxic respiratory failure Protein calorie malnutrition - prealbumibn 8/2 14.2  Acute on chronic anemia- s/p 3u pRBC 7/30-8/1, stable this AM, follow  Volume overload - ongoing diuresis per primary   FEN: TNA, SOFT + ensure ID: [id number] VTE: SCD's, ok to resume hep gtt without bolus from CCS perspective  Foley: continue for accurate I/O Dispo: ICU, stable for transfer OOU from surgical perspective.     Obie Dredge, PA-C Trauma & General Surgery Please use [email or web address] to contact on call provider  11/04/2019

## 2019-11-04 NOTE — Progress Notes (Signed)
PHARMACY - TOTAL PARENTERAL NUTRITION CONSULT NOTE   Indication: Prolonged ileus  Patient Measurements: Height: 5' 1"  (154.9 cm) Weight: 77 kg (169 lb 12.1 oz) IBW/kg (Calculated) : 47.8 TPN AdjBW (KG): 55.1 Body mass index is 32.07 kg/m.  Assessment:  7 yof with remote history of sigmoid colectomy and recent admission d/t colitis initially presented with lower extremity edema and later developed abdominal pain with decreased appetite. S/p EGD + colonoscopy on 7/21 that showed Crohn's colitis. On 7/23 a CT showed large amount pneumoperitoneum and patients underwent exlap with partial colectomy and colostomy. To start TPN for anticipated prolonged ileus.   On 7/30 repeat CT abdomen was obtained and found new large hematoma in R peritoneal cavity and patient was transferred back to the ICU for close monitoring. IR consulted for possible drainage of hematoma though not a candidate for drainage. Not requiring pressors at this time. Vancomycin and meropenem were started for concern of intra-abdominal infection.   8/1: Unable to administer TPN due IV tubing issues. Running D10% at 75 mL/hr until TPN can be resumed this evening.   Glucose / Insulin: CBGs ranging from 103-140. Received 3 unit(s) of rSSI + 10u regular insulin in TPN bag in 24 hours. Continues on solumedrol 46m q12h. Electrolytes: Na improved to 134. CO2 improved to 26, Other electrolytes wnl. On lasix 40 BID  Renal: SCr stable. UOP 1.8 ml/kg/hr. LFTs / TGs: AST/ALT elevated at 48/238, Alk phos 159. T bili normalized. TG 99 on 8/2.  Prealbumin / albumin: prealbumin increased from <5 to 14, albumin 1.6 ID: Patient is growing E.coli in 1/3 blood culture bottles. Discussed with medical team whether TPN should be held and PICC line replaced. Team thinks since this is a mild infection and E.coli does not routinely seed lines in combination with patient's degree of malnutrition, plan is to continue TPN and monitor patient closely  Intake  / Output; MIVF: 270 mL / 24 hrs drain output, 240 mL / 24 hrs colostomy output. Off MIVF GI Imaging: 7/23 Abd xray - ileus vs SBO 7/23 CT abd - Extensive pneumoperitoneum, most consistent with colonic perforation given the persistent diffuse colitis 7/29 CT abd - scattered small fluid collections, bowel inflammation 7/30 CT abd - R peritoneal cavity hematoma (at least 8093m, dilated colostomy loop and stomach containing air   Surgeries / Procedures:  7/21 Colonoscopy/EGD 7/24 Exlap, partial colectomy and colostomy  Central access: PICC placed 7/25 TPN start date: 7/25  Nutritional Goals (per RD recommendation on 7/29): kCal: 1700-1900, Protein: 90-105g, Fluid: >1.7L  Goal TPN rate is 60 mL/hr (provides 90 g of protein and 1841 kcals per day)  Current Nutrition:  Advanced to full liquids on 8/2  Ensure twice daily - on hold  TPN  Plan:  Continue concentrated TPN at goal rate of 6013mr at 1800 - provides 90 g of protein and 1841 kcals per day (100% goal calories).  Electrolytes in TPN (no adjustments today): Continue 150 mEq/L of Na, adjust to 44m25m of K, 5mEq40mof Ca, 6mEq/64mf Mg, and 20mmol5mf Phos. Cl:Ac ratio to 1:2 Monitor K and Mg closely as patient is being diuresed  Continue 10 units of insulin in TPN  Adjust SSI to sensitive SSI q4h and adjust as needed Add standard MVI and trace elements to TPN and monitor tbili closely Monitor TPN labs on Mon/Thurs  BenjamiAlbertina ParrD., BCPS, BCCCP Clinical Pharmacist Clinical phone for 11/04/19 until 3:30pm: [phone] 3:30pm, please refer to AMION [facility]-specific pharmacist

## 2019-11-04 NOTE — Progress Notes (Addendum)
Paged for patient having an acute change in mental status, trying to get out of bed and pulling off her telemetry monitoring. On arrival to patient's room, she is able to answer orientation questions but is confused about recent events, situation and some of the words she uses are inappropriate for the context she is trying to use them. She is also frustrated and does not want to answer questions. She does deny all pain pain or shortness of breath or dysuria. Discussed patient further with her nurse and her daughter on the phone, who both states she was stringing together words that did not make sense.  Per her daughter the patient was mildly confused this morning as well, unable to remember small things like what she had for breakfast or things they had just discussed and was similar later in the afternoon. Her nurse states that she was oriented x4 when she arrived for her shift this evening.   We were unable to perform a complete physical exam as patient was non-cooperative although she did let her nurse partially examine her prior to our arrival. On our exam she was moving all extremities, they are warm with 3+ pitting edema in the LE bilaterally. Cranial nerves were grossly intact although we were unable to do direct testing. Pupils are equal and reactive. Abdominal dressings from previous surgery were changed today and yesterday and no purulent drainage or odor was noted. She does appear to have a mild cough, non-labored breathing. There has not been anymore melena from her ostomy, and she has been making stool. She is wearing her SCDs. She has had good urine output and urine appears yellow.  Most recent vitals were within normal limits at 0022: Pulse 80, temp 98.2, resp 18, BP 112/64, patient is 95% on Room air.   Patient's admission for newly diagnosed Crohn's has been complicated by perforation s/p laparotomy with ostomy placement. She completed antibiotics with zosyn 7/31 and has been afebrile with  mild leukocytosis likely related to her steroids. She also had development of infrarenal aortic thrombus and small renal infarct for which she was initially placed on heparin gtt. This was stopped due to drop in hemoglobin and subsequent CT 7/30 showed 800cc hematoma within the peritoneal cavity causing compression of the IVC, proximal duodenum and right ureter with hydronephrosis, progression of her renal infarcts, dilated colostomy loop and small volume ascites.   Since stopping her heparin and receiving 1U transfusion her hemoglobin has been stable thus far. She is at risk for both worsening of her hematoma and further thrombotic events. CT Abd/Pelvis was planned for today but has not yet been done. Differential also includes acute stroke. Doubt acute urinary retention as she is having good Uop although she could be compensating with recent right hydronephrosis 2/2 peritoneal hematoma. Development of delirium is also a strong possibility as she has been here bed bound for two weeks, and could also possibly be secondary to steroids.   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

## 2019-11-04 NOTE — Consult Note (Signed)
[place] Nurse ostomy follow up Stoma type/location: LUQ colostomy Stomal assessment/size:  1 1/4" stoma.  Early mucocutaneous separation noted at 3 and 9 o'clock where bruising remains on skin as well.  Stoma is well budded so I did not use barrier ring to lessen pressure in this area.  Peristomal assessment: see above Treatment options for stomal/peristomal skin: 2 piece pouch flat Output soft brown stool Ostomy pouching: 2pc. 2 1/4" pouch  Education provided: patient cut barrier today and assisted with pouch application.  She is feeling better about this process.  Discussed twice weeklly pouch changes and emptying when 1/3 full.  She has been helping and observing emptying as well with staff.  Enrolled patient in [place] Start Discharge program: Yes [place] team will follow.   Domenic Moras MSN, RN, FNP-BC CWON Wound, Ostomy, Continence Nurse Pager [phone]

## 2019-11-04 NOTE — Progress Notes (Signed)
Report called to OR. Jewelry has been removed along with glasses. All of patients belongings are with the daughter. LABs are collected. Husband and daughter are at the bedside.OR is waiting to transport.

## 2019-11-04 NOTE — Progress Notes (Signed)
Subjective: Interviewed patient at bedside.  Feels tired today, reports that pain is controlled Daughter in room, discussing blood infection, TPN, steroid course, Daughter reports she and her father have noticed some confusion although she knows her name, where she is  Discussed with daughter importance of working with PT and need for r/p CT abdomen. Discussed with daughter anticoagulation, CBGs, Hgb Discussed importance of patient staying awak during the day, return to a normal sleep schedule  Objective:  Vital signs in last 24 hours: Vitals:   11/03/19 2320 11/04/19 0022 11/04/19 0310 11/04/19 0355  BP: 109/67 112/64 111/68 109/60  Pulse: 65 80  60  Resp:  18    Temp: 97.8 F (36.6 C) 98.2 F (36.8 C)  97.7 F (36.5 C)  TempSrc: Oral Oral  Oral  SpO2:  95%    Weight:      Height:       Physical Exam Vitals and nursing note reviewed.  Constitutional:      General: She is not in acute distress.    Appearance: She is not ill-appearing or toxic-appearing.  HENT:     Head: Normocephalic and atraumatic.  Cardiovascular:     Pulses:          Radial pulses are 2+ on the right side and 2+ on the left side.       Dorsalis pedis pulses are 2+ on the right side and 2+ on the left side.  Abdominal:     General: Abdomen is flat. There is no distension.     Palpations: Abdomen is soft. There is no mass.     Tenderness: There is no abdominal tenderness.     Comments: Wound is without erythema or purulent drainage. Skin intact.  Musculoskeletal:     Right lower leg: 3+ Pitting Edema present.     Left lower leg: 3+ Pitting Edema present.     Comments: Edema extends bilaterally up to pelvis  Psychiatric:        Attention and Perception: Attention normal.        Mood and Affect: Affect is angry.        Speech: Speech normal.        Behavior: Behavior is agitated.        Thought Content: Thought content normal.        Judgment: Judgment normal.       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 Hadhypotensionafter surgery eventuallyrequiring vasopressors. Post operative course complicated by intra-abdominal hematoma that occurred while anticoagulated. Over night became acutely altered, she was agitated and confused, unable to remember days conversations. E. coli bacteremia detected and started on ceftriaxone. Heparin has been held due to concerns for bleeding. Plan is to obtain CT abdomin/pelvis today to monitor hematoma and hydronephrosis. Vascular consulted and believe it is reasonable to hold heparin for now and resume gently once bleeding issue resolved. Plan to restart low dose heparin pending CT for a few days and then increase.   Crohn's Flare/Sigmoid Colon perforation:S/P colectomy on 7/24. Ostomy has had more formed stools without further sanguinous drainage.  On TPN for severe protein malnutrition. Encouraged continuing to eat and drink as much as is tolerated. -Continue advancing diet as able -Continue dressing changes on wound   Right peritoneal hematoma/Hemorrhagic Shock: Hemoglobin has remained steady at 9.8. Her pressures have remained normotensive. Does not appear to be occluding Right ureter. Plan to get CT abdomin/pelvis W contrast today to monitor for changes. Possibly restart heparin at low dose based on CT. -Obtain CT abdomin/pelvis W contrast today -Possible restart Heparin at low dose pending CT results   E. Coli Bacteremia: E. Coli on blood culture reflex panel. Ceftriaxone started.  May need to adjust once sensitivity results. Afebrile and WBC has continued to downtrend. Due to reassuring clinical presentation will continue with TPN due to severe malnutrition with reduced oral intake. -Started Ceftriaxone 2g IV daily (day 1) -Monitor for signs of deterioration -Trend WBC   Acute Delirium: Became acutely altered overnight. Due to thrombotic risk CT head WO contrast obtained which showed no acute intracranial abnormalities. Patient is still agitated this morning but memory seems to be improved. Discussed with patients daughter importance of her doing PT and staying awake/active during the day with only short naps, to improve sleep at night to improve delirium. Discussed that protracted hospital course is contributing.  -Work with PT as able to -Only short naps during the day -Continue to monitor for deterioration   Renal Infarcts/Abdominal aortic thrombus: Renal function appears to still be unchanged. Still responding well to Lasix 40 mg BID, urine output yesterday was 3300 cc. Plan to get CT abdomin/pelvis W contrast today to monitor for changes. Possibly restart heparin at low dose based on CT and after a few days increase dosing. -Obtain CT abdomin/pelvis W contrast today -Possible restart Heparin at low dose pending CT results -Continue IV Lasix 40 mg BID -Continue monitoring renal output   Right Hydronephrosis: Due to compression ofthe right ureter by the intra-abdominal hematoma. Will obtain CTA to monitor hematoma for changes in size and change in kidney size. -Obtain CT abdomin/pelvis W contrast today -Low threshold for Urology consult   Severe protein calorie malnutrition Albumin 1.6. On TPN due to GI complications.Currently on full liquids, will continue to advance diet per surgery and as tolerated.  -Continue advancing diet as able   DVT prophx: SCDs Diet:full liquids/TPN IPP:GFQM   Prior to Admission Living Arrangement: Home Anticipated Discharge  Location: Home Barriers to Discharge: Complex medication condition Dispo: Anticipated discharge in approximately 5-7 day(s).   Briant Cedar, MD 11/04/2019, 7:47 AM Pager: [phone] After 5pm on weekdays and 1pm on weekends: On Call pager [phone]

## 2019-11-04 NOTE — Op Note (Signed)
Preop diagnosis: Fascial dehiscence, mesenteric hematoma, pelvic abscess, mucocutaneous separation of the colostomy Postop diagnosis: Same Procedure performed: Exploratory laparotomy for dehiscence, colostomy revision, placement of wound VAC  Surgeon:Roux Brandy K Adaya Garmany Anesthesia: General Indications: This is a 54 year old female with multiple medical issues who is 10 days postoperative from an emergent exploratory laparotomy and Hartman's procedure for perforated sigmoid colon.  The patient has severe Crohn's colitis and is on intravenous steroids.  She also has developed a clot in her aorta and has required anticoagulation.  She developed some bleeding in the mesentery of the small bowel.  CT scan today confirmed the presence of the hematoma but also showed fascial separation.  Further examination of the wound shows exposed transverse colon.  She presents emergently for exploration.  Description of procedure:  The patient was brought to the OR and placed in a supine position on the table.  After an adequate level of anesthesia was obtained, a Foley catheter was placed under sterile technique.  The patient's dressing was removed.  Her colostomy appliance was removed.  The entire abdomen was prepped with Betadine and draped in sterile fashion.  A timeout was taken to ensure the proper patient and proper procedure.  On examination the colostomy shows approximately two thirds of the circumference with mucocutaneous separation.  There is no retraction of the colostomy and the colostomy seems to be viable.  There is a small amount of stool output.  The transverse colon is easily identified in the upper abdomen.  There is no sign of perforation or even thickening of the transverse colon.  All over the PDS sutures that were used to close the fascia had pulled through the fascia and were on the right side of the closure.  We removed the PDS sutures.  The entire wound was opened widely.  I was able to easily  bluntly dissect into the abdomen.  There were minimal adhesions likely secondary to the IV steroid use.  We then explored the entire abdomen.  The liver and gallbladder appear normal.  The cecum ascending colon and transverse colon all appeared normal.  The descending colon heads up to the colostomy site with no tension.  There is no sign of ischemia in the colon.  We then identified the stomach.  This appeared viable and free of any ischemia or injury.  We identified the ligament of Treitz.  I then ran the bowel distally.  There are some chronic adhesions, likely secondary from previous surgeries.  However there are no acute adhesions.  We were able to deliver the bowel out of the pelvis.  In the mid jejunum, there is a large hematoma within the mesentery.  However the bowel all appears to be viable.  We then explored the pelvis.  There is a thick inflammatory rind lining the pelvis.  This covers the uterus.  I did not identify the rectal stump.  There are no undrained abscess cavities.  The previously placed drain does not reach down into the pelvis so this was removed.  We debrided some of the inflammatory rind.  The patient does have some coagulopathy with diffuse oozing on the sides of the pelvis  A 19 French drain was brought in through the previous right-sided drain site and placed down into the pelvis.  This was secured to the skin with 2-0 nylon.  We irrigated the abdomen thoroughly.   We treated the sides of the pelvis with Surgicel powder.  The seem to help with the oozing.  We then  closed the fascia with multiple interrupted figure-of-eight #1 Novafil sutures.  We also placed four retention sutures of #2 Ethilon.  These were tied over red rubber catheter bridges.  A VAC sponge was cut to fit in between the retention sutures.  This was secured with an occlusive drape and placed to suction.  I then revise the colostomy by placing multiple interrupted 3-0 Vicryl sutures to mature the colostomy.  I placed  the sutures directly through the dermis and epidermis to try to secure the colostomy in place with no further mucocutaneous separation.  The ostomy appliance was cut to fit and was secured to the patient's side.  She was extubated and brought to the recovery room in stable condition.  All sponge, instrument, and needle counts are correct.  Imogene Burn. Georgette Dover, MD, [facility]  General/ Trauma Surgery   11/04/2019 9:14 PM

## 2019-11-04 NOTE — Anesthesia Procedure Notes (Signed)
Procedure Name: Intubation Date/Time: 11/04/2019 7:02 PM Performed by: Claris Che, CRNA Pre-anesthesia Checklist: Patient identified, Emergency Drugs available, Suction available, Patient being monitored and Timeout performed Patient Re-evaluated:Patient Re-evaluated prior to induction Oxygen Delivery Method: Circle system utilized Preoxygenation: Pre-oxygenation with 100% oxygen Induction Type: IV induction, Rapid sequence and Cricoid Pressure applied Ventilation: Mask ventilation without difficulty Laryngoscope Size: Mac and 4 Grade View: Grade II Tube type: Oral Tube size: 7.5 mm Number of attempts: 1 Airway Equipment and Method: Stylet Placement Confirmation: ETT inserted through vocal cords under direct vision,  positive ETCO2 and breath sounds checked- equal and bilateral Secured at: 23 cm Dental Injury: Teeth and Oropharynx as per pre-operative assessment

## 2019-11-05 ENCOUNTER — Encounter (HOSPITAL_COMMUNITY): Payer: Self-pay | Admitting: Surgery

## 2019-11-05 LAB — COMPREHENSIVE METABOLIC PANEL
ALT: 127 U/L — ABNORMAL HIGH (ref 0–44)
AST: 21 U/L (ref 15–41)
Albumin: 1.9 g/dL — ABNORMAL LOW (ref 3.5–5.0)
Alkaline Phosphatase: 126 U/L (ref 38–126)
Anion gap: 11 (ref 5–15)
BUN: 19 mg/dL (ref 6–20)
CO2: 24 mmol/L (ref 22–32)
Calcium: 7.8 mg/dL — ABNORMAL LOW (ref 8.9–10.3)
Chloride: 100 mmol/L (ref 98–111)
Creatinine, Ser: 0.36 mg/dL — ABNORMAL LOW (ref 0.44–1.00)
GFR calc Af Amer: >60 mL/min (ref >60)
GFR calc non Af Amer: >60 mL/min (ref >60)
Glucose, Bld: 141 mg/dL — ABNORMAL HIGH (ref 70–99)
Potassium: 3.9 mmol/L (ref 3.5–5.1)
Sodium: 135 mmol/L (ref 135–145)
Total Bilirubin: 0.7 mg/dL (ref 0.3–1.2)
Total Protein: 4.4 g/dL — ABNORMAL LOW (ref 6.5–8.1)

## 2019-11-05 LAB — GLUCOSE, CAPILLARY
Glucose-Capillary: 108 mg/dL — ABNORMAL HIGH (ref 70–99)
Glucose-Capillary: 110 mg/dL — ABNORMAL HIGH (ref 70–99)
Glucose-Capillary: 132 mg/dL — ABNORMAL HIGH (ref 70–99)
Glucose-Capillary: 133 mg/dL — ABNORMAL HIGH (ref 70–99)
Glucose-Capillary: 140 mg/dL — ABNORMAL HIGH (ref 70–99)
Glucose-Capillary: 145 mg/dL — ABNORMAL HIGH (ref 70–99)

## 2019-11-05 LAB — CULTURE, BLOOD (ROUTINE X 2)
Culture: NO GROWTH
Special Requests: ADEQUATE

## 2019-11-05 LAB — CBC
HCT: 31.9 % — ABNORMAL LOW (ref 36.0–46.0)
Hemoglobin: 10.6 g/dL — ABNORMAL LOW (ref 12.0–15.0)
MCH: 32.3 pg (ref 26.0–34.0)
MCHC: 33.2 g/dL (ref 30.0–36.0)
MCV: 97.3 fL (ref 80.0–100.0)
Platelets: 274 10*3/uL (ref 150–400)
RBC: 3.28 MIL/uL — ABNORMAL LOW (ref 3.87–5.11)
RDW: 20.1 % — ABNORMAL HIGH (ref 11.5–15.5)
WBC: 9.9 10*3/uL (ref 4.0–10.5)
nRBC: 0 % (ref 0.0–0.2)

## 2019-11-05 LAB — PHOSPHORUS: Phosphorus: 3.8 mg/dL (ref 2.5–4.6)

## 2019-11-05 LAB — MAGNESIUM: Magnesium: 2.1 mg/dL (ref 1.7–2.4)

## 2019-11-05 MED ORDER — ACETAMINOPHEN 325 MG PO TABS
650.0000 mg | ORAL_TABLET | Freq: Four times a day (QID) | ORAL | Status: DC
  Administered 2019-11-05 – 2019-11-23 (×65): 650 mg via ORAL
  Filled 2019-11-05 (×65): qty 2

## 2019-11-05 MED ORDER — LIDOCAINE 5 % EX PTCH
1.0000 | MEDICATED_PATCH | CUTANEOUS | Status: DC
  Administered 2019-11-05 – 2019-11-20 (×5): 1 via TRANSDERMAL
  Filled 2019-11-05 (×10): qty 1

## 2019-11-05 MED ORDER — CEFAZOLIN SODIUM-DEXTROSE 2-4 GM/100ML-% IV SOLN
2.0000 g | Freq: Three times a day (TID) | INTRAVENOUS | Status: DC
  Administered 2019-11-06 – 2019-11-08 (×7): 2 g via INTRAVENOUS
  Filled 2019-11-05 (×7): qty 100

## 2019-11-05 MED ORDER — HYDROMORPHONE HCL 1 MG/ML IJ SOLN
0.5000 mg | INTRAMUSCULAR | Status: DC | PRN
  Administered 2019-11-05 – 2019-11-07 (×4): 1 mg via INTRAVENOUS
  Administered 2019-11-07: 0.5 mg via INTRAVENOUS
  Filled 2019-11-05 (×5): qty 1

## 2019-11-05 MED ORDER — TRACE MINERALS CU-MN-SE-ZN 300-55-60-3000 MCG/ML IV SOLN
INTRAVENOUS | Status: AC
  Filled 2019-11-05: qty 604.8

## 2019-11-05 MED ORDER — TRAMADOL HCL 50 MG PO TABS
50.0000 mg | ORAL_TABLET | Freq: Four times a day (QID) | ORAL | Status: DC | PRN
  Administered 2019-11-05 – 2019-11-14 (×12): 100 mg via ORAL
  Administered 2019-11-15: 50 mg via ORAL
  Administered 2019-11-16: 100 mg via ORAL
  Filled 2019-11-05 (×10): qty 2
  Filled 2019-11-05: qty 1
  Filled 2019-11-05 (×6): qty 2

## 2019-11-05 MED ORDER — HYDROCODONE-ACETAMINOPHEN 7.5-325 MG PO TABS: 1.0000 | ORAL_TABLET | Freq: Four times a day (QID) | ORAL | Status: DC | PRN

## 2019-11-05 MED ORDER — METHOCARBAMOL 1000 MG/10ML IJ SOLN
1000.0000 mg | Freq: Three times a day (TID) | INTRAVENOUS | Status: DC
  Administered 2019-11-05 – 2019-11-09 (×12): 1000 mg via INTRAVENOUS
  Filled 2019-11-05 (×8): qty 10
  Filled 2019-11-05: qty 1000
  Filled 2019-11-05 (×5): qty 10

## 2019-11-05 NOTE — Progress Notes (Signed)
PHARMACY - TOTAL PARENTERAL NUTRITION CONSULT NOTE   Indication: Prolonged ileus  Patient Measurements: Height: 5' 1"  (154.9 cm) Weight: 60.4 kg (133 lb 2.5 oz) IBW/kg (Calculated) : 47.8 TPN AdjBW (KG): 55.1 Body mass index is 25.16 kg/m.  Assessment:  37 yof with remote history of sigmoid colectomy and recent admission d/t colitis initially presented with lower extremity edema and later developed abdominal pain with decreased appetite. S/p EGD + colonoscopy on 7/21 that showed Crohn's colitis. On 7/23 a CT showed large amount pneumoperitoneum and patients underwent exlap with partial colectomy and colostomy. To start TPN for anticipated prolonged ileus.   On 7/30 repeat CT abdomen was obtained and found new large hematoma in R peritoneal cavity and patient was transferred back to the ICU for close monitoring. IR consulted for possible drainage of hematoma though not a candidate for drainage. Not requiring pressors at this time. Vancomycin and meropenem were started for concern of intra-abdominal infection.   8/1: Unable to administer TPN due IV tubing issues. Running D10% at 75 mL/hr until TPN can be resumed this evening.   Glucose / Insulin: CBGs ranging from 107-145. Received 2 unit(s) of rSSI + 10u regular insulin in TPN bag in 24 hours. Solumedrol decreased to 77m q12h. Electrolytes: Na improved to 135, Other electrolytes wnl. On lasix 40 BID  Renal: SCr stable. UOP 0.4 ml/kg/hr with 1x unmeasured output  LFTs / TGs: AST/ALT improved, Alk phos normalized. T bili normalized. TG 99 on 8/2.  Prealbumin / albumin: prealbumin increased from <5 to 14, albumin 1.9 ID: Patient is growing E.coli in 1/3 blood culture bottles. Discussed with medical team whether TPN should be held and PICC line replaced. Team thinks since this is a mild infection and E.coli does not routinely seed lines in combination with patient's degree of malnutrition, plan is to continue TPN and monitor patient closely. Of  note, WBC has normalized on ceftriaxone. E.coli sensitivities pending  Intake / Output; MIVF: 220 mL / 24 hrs drain output, No colostomy output charted  GI Imaging: 7/23 Abd xray - ileus vs SBO 7/23 CT abd - Extensive pneumoperitoneum, most consistent with colonic perforation given the persistent diffuse colitis 7/29 CT abd - scattered small fluid collections, bowel inflammation 7/30 CT abd - R peritoneal cavity hematoma (at least 8058m, dilated colostomy loop and stomach containing air  8/4: CT scan with persistent hematoma and new fluid collection in the pelvis concerning for abscess.   Surgeries / Procedures:  7/21 Colonoscopy/EGD 7/24 Exlap, partial colectomy and colostomy 8/4: Repeat Exlap   Central access: PICC placed 7/25 TPN start date: 7/25  Nutritional Goals (per RD recommendation on 7/29): kCal: 1700-1900, Protein: 90-105g, Fluid: >1.7L  Goal TPN rate is 60 mL/hr (provides 90 g of protein and 1841 kcals per day)  Current Nutrition:  NPO Ensure twice daily - on hold  TPN  Plan:  Continue concentrated TPN at goal rate of 6039mr at 1800 - provides 90 g of protein and 1841 kcals per day (100% goal calories).  Electrolytes in TPN (no adjustments today): Continue 150 mEq/L of Na, adjust to 34m37m of K, 5mEq32mof Ca, 6mEq/46mf Mg, and 20mmol64mf Phos. Cl:Ac ratio to 1:2 Monitor K and Mg closely as patient is being diuresed  Continue 10 units of insulin in TPN  Adjust SSI to sensitive SSI q4h and adjust as needed Add standard MVI and trace elements to TPN and monitor tbili closely Monitor TPN labs on Mon/Thurs  BenjamiAlbertina ParrD., BCPS, BCCCP  Clinical Pharmacist Clinical phone for 11/05/19 until 3:30pm: [phone] If after 3:30pm, please refer to [facility] for unit-specific pharmacist

## 2019-11-05 NOTE — Progress Notes (Addendum)
General Surgery Follow Up Note  Subjective:    Overnight Issues:  Taken back to OR last night due to fascial dehiscence. Tolerating small amts CLD. Reports feeling very sore and having a lot of abd pain but got some rest after getting pain meds a couple of hours ago. Daughter at bedside. We discussed her surgery and plans for wound VAC changes M/W/F. No reported CP, nausea, vomiting.   Objective:  Vital signs for last 24 hours: Temp:  [97.5 F (36.4 C)-98.7 F (37.1 C)] 97.6 F (36.4 C) (08/05 0740) Pulse Rate:  [79-118] 99 (08/05 0740) Resp:  [14-22] 20 (08/05 0740) BP: (98-127)/(63-85) 113/64 (08/05 0740) SpO2:  [88 %-100 %] 93 % (08/05 0740) FiO2 (%):  [50 %-80 %] 75 % (08/04 2230) Weight:  [60.4 kg-77 kg] 60.4 kg (08/05 0500)  Hemodynamic parameters for last 24 hours:    Intake/Output from previous day: 08/04 0701 - 08/05 0700 In: 2370.4 [P.O.:450; I.V.:1570.4; IV Piggyback:350] Out: 900 [Urine:560; Drains:240; Blood:100]  Intake/Output this shift: Total I/O In: -  Out: 75 [Drains:75]  Vent settings for last 24 hours: FiO2 (%):  [50 %-80 %] 75 %  Physical Exam:  Gen: comfortable, no distress Neuro: non-focal exam HEENT: PERRL Neck: supple CV: RRR Pulm: unlabored breathing Abd: soft, NPWT in place holding suction, retention sutures in tact, JP drain SS ostomy viable with no gas/stool in pouch. GU: foley in place w/ clear yellow urine Extr: wwp, pitting bilateral lower extremity edema to mid-shin    Results for orders placed or performed during the hospital encounter of 10/18/19 (from the past 24 hour(s))  Glucose, capillary     Status: Abnormal   Collection Time: 11/04/19 11:41 AM  Result Value Ref Range   Glucose-Capillary 130 (H) 70 - 99 mg/dL  Glucose, capillary     Status: Abnormal   Collection Time: 11/04/19  3:57 PM  Result Value Ref Range   Glucose-Capillary 107 (H) 70 - 99 mg/dL  Type and screen [place]     Status: None    Collection Time: 11/04/19  6:05 PM  Result Value Ref Range   ABO/RH(D) O POS    Antibody Screen NEG    Sample Expiration      11/07/2019,2359 Performed at [facility], [place] [address] [place], [place] [address]   Glucose, capillary     Status: None   Collection Time: 11/04/19 11:01 PM  Result Value Ref Range   Glucose-Capillary 95 70 - 99 mg/dL  Glucose, capillary     Status: Abnormal   Collection Time: 11/05/19  4:19 AM  Result Value Ref Range   Glucose-Capillary 145 (H) 70 - 99 mg/dL  CBC     Status: Abnormal   Collection Time: 11/05/19  4:45 AM  Result Value Ref Range   WBC 9.9 4.0 - 10.5 K/uL   RBC 3.28 (L) 3.87 - 5.11 MIL/uL   Hemoglobin 10.6 (L) 12.0 - 15.0 g/dL   HCT 31.9 (L) 36 - 46 %   MCV 97.3 80.0 - 100.0 fL   MCH 32.3 26.0 - 34.0 pg   MCHC 33.2 30.0 - 36.0 g/dL   RDW 20.1 (H) 11.5 - 15.5 %   Platelets 274 150 - 400 K/uL   nRBC 0.0 0.0 - 0.2 %  Comprehensive metabolic panel     Status: Abnormal   Collection Time: 11/05/19  4:45 AM  Result Value Ref Range   Sodium 135 135 - 145 mmol/L  Potassium 3.9 3.5 - 5.1 mmol/L   Chloride 100 98 - 111 mmol/L   CO2 24 22 - 32 mmol/L   Glucose, Bld 141 (H) 70 - 99 mg/dL   BUN 19 6 - 20 mg/dL   Creatinine, Ser 0.36 (L) 0.44 - 1.00 mg/dL   Calcium 7.8 (L) 8.9 - 10.3 mg/dL   Total Protein 4.4 (L) 6.5 - 8.1 g/dL   Albumin 1.9 (L) 3.5 - 5.0 g/dL   AST 21 15 - 41 U/L   ALT 127 (H) 0 - 44 U/L   Alkaline Phosphatase 126 38 - 126 U/L   Total Bilirubin 0.7 0.3 - 1.2 mg/dL   GFR calc non Af Amer >60 >60 mL/min   GFR calc Af Amer >60 >60 mL/min   Anion gap 11 5 - 15  Magnesium     Status: None   Collection Time: 11/05/19  4:45 AM  Result Value Ref Range   Magnesium 2.1 1.7 - 2.4 mg/dL  Phosphorus     Status: None   Collection Time: 11/05/19  4:45 AM  Result Value Ref Range   Phosphorus 3.8 2.5 - 4.6 mg/dL  Glucose, capillary     Status: Abnormal   Collection Time: 11/05/19  7:38 AM  Result Value Ref Range    Glucose-Capillary 108 (H) 70 - 99 mg/dL    Assessment & Plan: The plan of care was discussed with the bedside nurse for the day, who is in agreement with this plan and no additional concerns were raised.   Present on Admission: . (Resolved) Hyponatremia . Crohn's disease of large intestine with other complication ([place]) . Malnutrition of moderate degree ([place]) . Pressure injury of skin . Peritoneal hematoma . 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   and I reviewed the patients new imaging test results.    Focal mural thrombus within the infrarenal aorta - per vascular , no aortic thrombus noted on scan 8/4, D/C hep gtt  Active Crohn's colitis- S/P EGD/colonoscopy 7/21 by Dr. Henrene Pastor, per GI, started Solumedrol 7/27 Sigmoid colon perforation-  -  10/24/19:  partial colectomy and end colostomy by Dr. Kae Heller. POD#12.  -  10/30/19: large mesenteric hematoma noted, hep gtt held -  11/04/19: fascial dehiscence with visible transverse colon: S/P exploratory laparotomy, colostomy revision, fascial closure, placement 4 retention sutures, placement of wound VAC, Dr. Georgette Dover  - CLD and await ROBF - NPWT M/W/F, WOC consulted  - cont JP drain - increase pain regimen: schedule APAP, add IV robaxin, add Lidoderm patch, PRN tramadol   Sepsis- per primary team, off pressors  Acute hypoxic respiratory failure Protein calorie malnutrition - prealbumibn 8/2 14.2  Acute on chronic anemia- s/p 3u pRBC 7/30-8/1, stable this AM, follow  Volume overload - ongoing diuresis per primary   FEN: TNA, SOFT + ensure ID: [id number]; Ancef listed to start 8/6 for bacteremia (1/2 blood Cx 7/31 showed E.Coli) VTE: SCD's, ok for chemical VTE ppx with lovenox or SQH from CCS perspective.  Foley: continue, plan to D/C tomorrow 8/6 if ok with primary service  Dispo: ICU, stable for transfer OOU from surgical perspective.     Obie Dredge, PA-C Trauma & General Surgery Please use [email or web address] to contact on call provider  11/05/2019

## 2019-11-05 NOTE — Progress Notes (Signed)
OT Cancellation Note  Patient Details Name: PEDRO OLDENBURG MRN: [id number] DOB: 04/15/1965   Cancelled Treatment:    Reason Eval/Treat Not Completed: Fatigue/lethargy limiting ability to participate.  Will reattempt.  Nilsa Nutting., OTR/L Acute Rehabilitation Services Pager [phone] Office [phone]   Lucille Passy M 11/05/2019, 1:52 PM

## 2019-11-05 NOTE — Progress Notes (Signed)
Heard patients alarm from BIPAP I entered room patient had taken herself off BIPAP.  I advised her I would help her get mask back on.  She stated she wasn't wearing the mask anymore there was no need in it just to leave the door open.  RN at bedside and spoke to patient as well.  I advised patient that we would at least place her on a nasal cannula.  She said no need in that as well.  RN calling MD for further update.

## 2019-11-05 NOTE — Progress Notes (Signed)
   VASCULAR SURGERY ASSESSMENT & PLAN:   INFRARENAL AORTIC THROMBUS: I reviewed the patient's follow-up CT scan yesterday.  I really do not visualize any significant aortic thrombus.  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  All things considered from our standpoint I do not think she needs to be on intravenous heparin at this point.  She has palpable pedal pulses.  I would recommend DVT prophylaxis.  Vascular surgery will be available as needed.  Dr. Carlis Abbott who originally seen the patient will be back next week if needed.  SUBJECTIVE:   Having significant abdominal pain is she had surgery last night  PHYSICAL EXAM:   Vitals:   11/05/19 0102 11/05/19 0330 11/05/19 0500 11/05/19 0740  BP:  98/63  113/64  Pulse: 100 89  99  Resp: 18 17  20   Temp:  98 F (36.7 C)  97.6 F (36.4 C)  TempSrc:  Axillary  Oral  SpO2: 95% 97%  93%  Weight:   60.4 kg   Height:       Palpable pedal pulses.  LABS:   Lab Results  Component Value Date   WBC 9.9 11/05/2019   HGB 10.6 (L) 11/05/2019   HCT 31.9 (L) 11/05/2019   MCV 97.3 11/05/2019   PLT 274 11/05/2019   Lab Results  Component Value Date   CREATININE 0.36 (L) 11/05/2019   Lab Results  Component Value Date   INR 1.2 10/31/2019   CBG (last 3)  Recent Labs    11/04/19 2301 11/05/19 0419 11/05/19 0738  GLUCAP 95 145* 108*    PROBLEM LIST:    Principal Problem:   Colon perforation ([facility]) Active Problems:   Crohn's disease of large intestine with other complication ([facility])   Malnutrition of moderate degree ([facility])   Pressure injury of skin   Peritoneal hematoma   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 Chlorhexidine Gluconate Cloth  6 each Topical Daily  . enoxaparin (LOVENOX) injection  40 mg Subcutaneous Q24H  . feeding supplement (ENSURE ENLIVE)  237 mL Oral TID PC & HS  . fentaNYL      . insulin aspart  0-9 Units Subcutaneous Q4H  .  methylPREDNISolone (SOLU-MEDROL) injection  30 mg Intravenous Q12H  . pantoprazole  40 mg Oral Q0600  . sodium chloride flush  10-40 mL Intracatheter Q12H  . sodium chloride flush  3 mL Intravenous Once  . vitamin A  10,000 Units Oral Daily    Deitra Mayo Office: [phone] 11/05/2019

## 2019-11-05 NOTE — Progress Notes (Signed)
Pt and Pt's family requested only giving 0.87m due to patient's reaction to 159mpreviously. RN gave 0.36m47mf diluadid and wasted 0.36mg56m diluadid with RN Mika in stericycle.

## 2019-11-05 NOTE — Progress Notes (Signed)
Patient refusing BIPAP. RN placed patient on 4L [place].  Patient maintaining O2 sats >95%. MD aware.

## 2019-11-05 NOTE — Progress Notes (Signed)
Patient arrived to unit from PACU after her abdominal operation at 2349. Vitals obtained and stable; HR slightly tachy in low 100s. Patient on BiPap. PACU RN informed this RN that Internal Medicine would come to 4NP to assess her. Patient resting in bed, call bell within reach, and bed alarm set.

## 2019-11-05 NOTE — Progress Notes (Addendum)
Subjective: Interviewed patient at bedside.   Patient somnolent, reportedly just received dose of dilaudid, Discussed with family biologic medication and will need Discussed holding lasix and will remove foley per surgery. Allowing clear liquid diet. Will resume low dose lovenox Discussed trying oral pain medications for longer lasting pain control, family stated patient had bad reaction to Oxycodone.  Objective:  Vital signs in last 24 hours: Vitals:   11/04/19 2249 11/05/19 0027 11/05/19 0102 11/05/19 0330  BP: 110/83   98/63  Pulse: (!) 107  100 89  Resp: 18  18 17   Temp: 98.6 F (37 C) 98.5 F (36.9 C)  98 F (36.7 C)  TempSrc: Axillary Axillary  Axillary  SpO2: 98%  95% 97%  Weight:      Height:       Physical Exam Vitals and nursing note reviewed.  Constitutional:      General: She is not in acute distress.    Appearance: She is ill-appearing.  HENT:     Head: Normocephalic and atraumatic.  Cardiovascular:     Pulses:          Radial pulses are 2+ on the right side and 2+ on the left side.       Dorsalis pedis pulses are 2+ on the right side and 2+ on the left side.  Abdominal:     General: There is no distension.     Palpations: There is no mass.     Tenderness: There is abdominal tenderness. There is no guarding.     Comments: Incision looks clean and dry with no erythema.  Musculoskeletal:     Right lower leg: 2+ Pitting Edema present.     Left lower leg: 2+ Pitting Edema present.     Comments: Edema extends up to the pelvis.     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 Had hypotension after surgery eventually requiring vasopressors. Post operative course has been further complicated. Surgery evaluated patient last night and saw upper midline abdominal dehiscence and exposure of her transverse colon. She underwent exploratory lap, all bowel found to be viable and ostomy was viable. Inflammatory rind was debrided and new drain placed in pelvis. The abdomin was reclosed with wound vac in place and ostomy reattached. She tolerated the procedure well. Vascular consulted and believe it is reasonable to resume low dose heparin, will restart low dose heparin this afternoon. Encouraged liquids today. Sensitivity results came back and E. Coli bacteremia is pan-sensitive so Ceftriaxone was stopped and Cefazolin was started. For pain control Tylenol 650 mg oral q6 ordered and Dilaudid changed to 0.5-1 mg q2 PRN severe pain. Per surgery IV methocarbamol 1 g q8 will be started.   Crohn's Flare/Sigmoid Colon perforation: S/P colectomy on 7/24. S/P exploratory Lap 8/5. Ostomy was found to still be viable. Minimal output so far today. Continues to be on TPN for severe protein malnutrition. Encouraged continuing to eat and drink as much as is tolerated as this is crucial for healing. -Continue advancing diet as able -Continue  dressing changes on wound -Tapering down solumedrol to 43m IV q12 hours -Continue Protonix 40 mg daily -Per surgery IV methocarbamol 1 g q8 will be started   Right peritoneal hematoma/Hemorrhagic Shock: Hemoglobin has continued to slow increase to 10.6 today. Her pressures have remained normotensive but a little soft post exploratory lap. CT abdomin/pelvis W contrast yesterday showed that hematoma was stable.  - Check PT and PTT again given signs of coagulopathy - Resume lovenox 433mdaily for DVT prophylaxis given she is also at high risk for VTE   E. Coli Bacteremia:  E. Coli on blood culture reflex panel. Afebrile and WBC has continued to downtrend 9.9 today. Due to reassuring clinical presentation will continue with TPN due to severe malnutrition with reduced oral intake. E. Coli found to be pan sensitive so stopped Ceftriaxone and will start Cefazolin 2 g q8 for 4-6 days. -Started Cefazolin 2g IV q8 (day 3) -Continue to trend WBC   Acute Delirium: Became acutely altered at night on 8/3. CT head WO contrast obtained which showed no acute intracranial abnormalities. Patient was doing better yesterday. Re-enforced with patients daughter to keep patient active during the day improve delirium. Discussed that protracted hospital course is contributing.  -Unable to work with PT today due to fatigue from surgery last night -Only short naps during the day -Family at bedside for re-orientation   Renal Infarcts/Abdominal aortic thrombus: Renal function appears adequate. Urine output yesterday was only 560cc. Repeat CT abdomin/pelvis W contrast showed no hydronephrosis. Only showing single ill-defined area in lower pole of right kidney possibly representing an old infarction. Received morning dose of Lasix but will hold rest for now. -Will restart Lovenox 40 mg subcutaneous -Continue monitoring renal output   Right Hydronephrosis (resolved): Repeat CT abdomin/pelvis W Contrast obtained yesterday. Hydronephrosis was no longer present.   Severe protein calorie malnutrition Albumin 1.6. On TPN due to GI complications. Currently on full liquids, will continue to advance diet per surgery and as tolerated. Encouraged cautious liquid intake today due to surgery last night. -Continue advancing diet as able     DVT prophx: SCDs and lovenox subcutaneous Diet: full liquids/TPN IVXTG:GYIRPrior to Admission Living Arrangement: Home Anticipated Discharge Location: SNF Barriers to Discharge: Complex medical issues Dispo: Anticipated discharge in approximately 5-7 day(s).     PaBriant CedarMD 11/05/2019, 5:47 AM Pager: [phone] 5pm on weekdays and 1pm on weekends: On Call pager [phone]

## 2019-11-06 ENCOUNTER — Ambulatory Visit: Payer: 59 | Admitting: Nurse Practitioner

## 2019-11-06 ENCOUNTER — Inpatient Hospital Stay (HOSPITAL_COMMUNITY): Payer: 59

## 2019-11-06 DIAGNOSIS — K631 Perforation of intestine (nontraumatic): Secondary | ICD-10-CM | POA: Diagnosis not present

## 2019-11-06 LAB — CBC
HCT: 25.9 % — ABNORMAL LOW (ref 36.0–46.0)
HCT: 27.7 % — ABNORMAL LOW (ref 36.0–46.0)
Hemoglobin: 8.3 g/dL — ABNORMAL LOW (ref 12.0–15.0)
Hemoglobin: 9 g/dL — ABNORMAL LOW (ref 12.0–15.0)
MCH: 31.8 pg (ref 26.0–34.0)
MCH: 32.1 pg (ref 26.0–34.0)
MCHC: 32 g/dL (ref 30.0–36.0)
MCHC: 32.5 g/dL (ref 30.0–36.0)
MCV: 99.2 fL (ref 80.0–100.0)
MCV: 98.9 fL (ref 80.0–100.0)
Platelets: 247 10*3/uL (ref 150–400)
Platelets: 299 10*3/uL (ref 150–400)
RBC: 2.61 MIL/uL — ABNORMAL LOW (ref 3.87–5.11)
RBC: 2.8 MIL/uL — ABNORMAL LOW (ref 3.87–5.11)
RDW: 19.8 % — ABNORMAL HIGH (ref 11.5–15.5)
RDW: 19.9 % — ABNORMAL HIGH (ref 11.5–15.5)
WBC: 9.5 10*3/uL (ref 4.0–10.5)
WBC: 12.4 10*3/uL — ABNORMAL HIGH (ref 4.0–10.5)
nRBC: 0 % (ref 0.0–0.2)
nRBC: 0 % (ref 0.0–0.2)

## 2019-11-06 LAB — BASIC METABOLIC PANEL
Anion gap: 8 (ref 5–15)
BUN: 19 mg/dL (ref 6–20)
CO2: 28 mmol/L (ref 22–32)
Calcium: 7.9 mg/dL — ABNORMAL LOW (ref 8.9–10.3)
Chloride: 101 mmol/L (ref 98–111)
Creatinine, Ser: 0.32 mg/dL — ABNORMAL LOW (ref 0.44–1.00)
GFR calc Af Amer: >60 mL/min (ref >60)
GFR calc non Af Amer: >60 mL/min (ref >60)
Glucose, Bld: 114 mg/dL — ABNORMAL HIGH (ref 70–99)
Potassium: 3.8 mmol/L (ref 3.5–5.1)
Sodium: 137 mmol/L (ref 135–145)

## 2019-11-06 LAB — GLUCOSE, CAPILLARY
Glucose-Capillary: 110 mg/dL — ABNORMAL HIGH (ref 70–99)
Glucose-Capillary: 112 mg/dL — ABNORMAL HIGH (ref 70–99)
Glucose-Capillary: 113 mg/dL — ABNORMAL HIGH (ref 70–99)
Glucose-Capillary: 121 mg/dL — ABNORMAL HIGH (ref 70–99)
Glucose-Capillary: 130 mg/dL — ABNORMAL HIGH (ref 70–99)
Glucose-Capillary: 145 mg/dL — ABNORMAL HIGH (ref 70–99)

## 2019-11-06 LAB — PROTIME-INR
INR: 1.1 (ref 0.8–1.2)
Prothrombin Time: 13.5 seconds (ref 11.4–15.2)

## 2019-11-06 LAB — APTT: aPTT: 32 seconds (ref 24–36)

## 2019-11-06 LAB — HEPATITIS B CORE ANTIBODY, TOTAL: Hep B Core Total Ab: NONREACTIVE

## 2019-11-06 MED ORDER — SODIUM CHLORIDE 3 % IN NEBU
4.0000 mL | INHALATION_SOLUTION | Freq: Two times a day (BID) | RESPIRATORY_TRACT | Status: AC
  Administered 2019-11-06 – 2019-11-08 (×4): 4 mL via RESPIRATORY_TRACT
  Filled 2019-11-06 (×5): qty 4

## 2019-11-06 MED ORDER — FUROSEMIDE 10 MG/ML IJ SOLN: 40.0000 mg | Freq: Once | INTRAMUSCULAR | Status: DC

## 2019-11-06 MED ORDER — FUROSEMIDE 10 MG/ML IJ SOLN
40.0000 mg | Freq: Two times a day (BID) | INTRAMUSCULAR | Status: DC
  Administered 2019-11-06 – 2019-11-09 (×8): 40 mg via INTRAVENOUS
  Filled 2019-11-06 (×8): qty 4

## 2019-11-06 MED ORDER — TRACE MINERALS CU-MN-SE-ZN 300-55-60-3000 MCG/ML IV SOLN
INTRAVENOUS | Status: AC
  Filled 2019-11-06: qty 604.8

## 2019-11-06 MED ORDER — METHYLPREDNISOLONE SODIUM SUCC 40 MG IJ SOLR
20.0000 mg | Freq: Two times a day (BID) | INTRAMUSCULAR | Status: DC
  Administered 2019-11-06 – 2019-11-10 (×8): 20 mg via INTRAVENOUS
  Filled 2019-11-06 (×8): qty 1

## 2019-11-06 MED ORDER — GUAIFENESIN ER 600 MG PO TB12
600.0000 mg | ORAL_TABLET | Freq: Two times a day (BID) | ORAL | Status: DC | PRN
  Administered 2019-11-08: 600 mg via ORAL
  Filled 2019-11-06: qty 1

## 2019-11-06 NOTE — Progress Notes (Signed)
Subjective: Interviewed patient at bedside.  Mentions having some improvement in her symptoms but mentions states 'feels like have been hit by a bat' States she is able to tolerate oral intake. States she has been drinking some fluids without difficult but is hesistant to advance diet due to fear of complications.  Objective:  Vital signs in last 24 hours: Vitals:   11/05/19 1952 11/06/19 0002 11/06/19 0322 11/06/19 0500  BP: 105/70 100/61 (!) 105/58   Pulse: 84 78 65   Resp: 13 17 10    Temp:  (!) 97.5 F (36.4 C) (!) 97.5 F (36.4 C)   TempSrc:  Oral Axillary   SpO2: 92% 91% 94%   Weight:    63 kg  Height:       Physical Exam Vitals and nursing note reviewed.  Constitutional:      General: She is not in acute distress.    Appearance: Normal appearance. She is not ill-appearing or toxic-appearing.  HENT:     Head: Normocephalic and atraumatic.  Cardiovascular:     Rate and Rhythm: Normal rate and regular rhythm.     Pulses: Normal pulses.     Heart sounds: Normal heart sounds. No murmur heard.   Pulmonary:     Effort: Pulmonary effort is normal. No respiratory distress.     Breath sounds: Normal breath sounds. No wheezing.  Abdominal:     General: There is no distension.     Palpations: Abdomen is soft. There is no mass.     Tenderness: There is abdominal tenderness.  Musculoskeletal:     Right lower leg: Edema present.     Left lower leg: Edema present.     Comments: Edema extends up to pelvis bilaterally  Neurological:     General: No focal deficit present.     Mental Status: She is alert. Mental status is at baseline.  Psychiatric:        Mood and Affect: Mood normal.        Behavior: Behavior normal.        Thought Content: Thought content normal.      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   Acute Hypoxia: Was on 5 L nasal canula and satting around 87%. Bedside ultra sound showed bilateral pleural effusion R>L. Obtained portable chest X. Chest X showed Increased LEFT pleural effusion and LEFT lung atelectasis causing subtotal opacification of the LEFT hemithorax.Question mucous plugging LEFT lower lobe. Fluid most likely due to anasarca. -Restarted Lasix IV 40 mg BID.   Crohn's Flare/Sigmoid Colon perforation:S/Pcolectomy on 7/24. S/P exploratory Lap early morning 8/5. Closed wound with wound Vac.Continues to be onTPNfor severe protein malnutrition.There was a small amount of brown liquid in ostomy bag. -Continue advancing diet as able -Continue dressing changes on wound  -Per GI decrease solumedrol to 65m IV q12 hours -Continue Protonix 40 mg daily -Per surgery continue IV methocarbamol 1 g q8   Right peritoneal hematoma/Hemorrhagic Shock:Hemoglobin today is 8.3 down from 10.6. Will recheck CBC in  the afternoon. Her pressures have remained normotensive but a little soft post exploratory lap. CT abdomin/pelvis W contrast yesterday showed that hematoma was stable. -Repeat CBC in afternoon -Low threshold for repeat CT due to previous hematoma with restarting Heparin   E. Coli Bacteremia:E. Coli on blood culture reflex panel. Remains afebrile and WBC has continued to downtrend 9.5 today. Due to reassuring clinical presentation will continue with TPN due to severe malnutrition with reduced oral intake. Continue Cefazolin 2 g q8 for 1-3 days. -Continue Cefazolin 2g IV q8 (day 4) -Continue to trend WBC   Acute  Delirium:Became acutely altered at night on 8/3. Was not altered today and was not agitated. Re-enforced with patient and with patients daughter importance of keeping active during the day improve delirium. Discussed that protracted hospital course is contributing. Discussed importance of working with PT every day as able to. -Continue to work with PT as able/comfortable  -Only short naps during the day -Family at bedside for re-orientation   Renal Infarcts/Abdominal aortic thrombus:Renal functionappears adequate. Urine output yesterday was 1275cc with only a single dose of lasix. Restarted lasix. -Restart Lasix IV 40 mg BID -Will restart Lovenox 40 mg subcutaneous -Continue monitoring renal output   Right Hydronephrosis (resolved): Repeat CT abdomin/pelvis W Contrast obtained 8/4. Hydronephrosis was no longer present.   Severe protein calorie malnutrition Albumin 1.6. On TPN due to GI complications.Currently onfull liquids, will continue to advance diet per surgeryand as tolerated. Reports drinking some liquids this morning and wanting to drink more, encouraged this. -Continue drinking liquids -Continue advancing diet as able   DVT prophx: SCDs and lovenox subcutaneous Diet:full liquids/TPN YSA:YTKZ   Prior to Admission Living Arrangement: Home Anticipated Discharge Location: Home Barriers to Discharge: Complex medical Issues Dispo: Anticipated discharge in approximately 5-7 day(s).   Briant Cedar, MD 11/06/2019, 6:18 AM Pager: [phone] After 5pm on weekdays and 1pm on weekends: On Call pager [phone]

## 2019-11-06 NOTE — Progress Notes (Signed)
[facility] Progress Note  2 Days Post-Op  Subjective: CC-  Continues to have abdominal pain. Pain medication does help. Denies any n/v. Tolerating clear liquids. Ostomy functioning.  WBC down 10.6, afebrile.  She did get lovenox yesterday. Hgb 8.3 from 10.6. BP and HR stable. Feeling intermittently SOB, requiring supplemental oxygen. CXR shows increased left pleural effusion and atelectasis.  Objective: Vital signs in last 24 hours: Temp:  [97.5 F (36.4 C)-98.5 F (36.9 C)] 98.5 F (36.9 C) (08/06 1146) Pulse Rate:  [65-86] 85 (08/06 1146) Resp:  [10-17] 12 (08/06 1146) BP: (95-107)/(54-70) 107/54 (08/06 1146) SpO2:  [91 %-94 %] 93 % (08/06 1146) Weight:  [63 kg] 63 kg (08/06 0500) Last BM Date: 11/03/19  Intake/Output from previous day: 08/05 0701 - 08/06 0700 In: 1238.6 [P.O.:100; I.V.:1138.6] Out: 1530 [Urine:1275; Drains:205; Stool:50] Intake/Output this shift: Total I/O In: 300 [P.O.:300] Out: 1300 [Urine:1300]  PE: Gen:  Alert, NAD, pleasant Card:  RRR Pulm:  rate and effort normal on supplemental oxygen via [place] Abd: Soft, ND, appropriately tender, drain with SS fluid, ostomy viable with gas/liquid stool in pouch, open midline incision pictured below with retention sutures in place     Lab Results:  Recent Labs    11/05/19 0445 11/06/19 0725  WBC 9.9 9.5  HGB 10.6* 8.3*  HCT 31.9* 25.9*  PLT 274 247   BMET Recent Labs    11/05/19 0445 11/06/19 0725  NA 135 137  K 3.9 3.8  CL 100 101  CO2 24 28  GLUCOSE 141* 114*  BUN 19 19  CREATININE 0.36* 0.32*  CALCIUM 7.8* 7.9*   PT/INR Recent Labs    11/06/19 0725  LABPROT 13.5  INR 1.1   CMP     Component Value Date/Time   NA 137 11/06/2019 0725   K 3.8 11/06/2019 0725   CL 101 11/06/2019 0725   CO2 28 11/06/2019 0725   GLUCOSE 114 (H) 11/06/2019 0725   BUN 19 11/06/2019 0725   CREATININE 0.32 (L) 11/06/2019 0725   CALCIUM 7.9 (L) 11/06/2019 0725   PROT 4.4 (L) 11/05/2019  0445   ALBUMIN 1.9 (L) 11/05/2019 0445   AST 21 11/05/2019 0445   ALT 127 (H) 11/05/2019 0445   ALKPHOS 126 11/05/2019 0445   BILITOT 0.7 11/05/2019 0445   GFRNONAA >60 11/06/2019 0725   GFRAA >60 11/06/2019 0725   Lipase     Component Value Date/Time   LIPASE 18 09/19/2019 1631       Studies/Results: CT ABDOMEN PELVIS W CONTRAST  Result Date: 11/04/2019 CLINICAL DATA:  Hematuria. EXAM: CT ABDOMEN AND PELVIS WITH CONTRAST TECHNIQUE: Multidetector CT imaging of the abdomen and pelvis was performed using the standard protocol following bolus administration of intravenous contrast. CONTRAST:  155m OMNIPAQUE IOHEXOL 300 MG/ML  SOLN COMPARISON:  October 30, 2019. FINDINGS: Lower chest: Stable bilateral pleural effusions are noted with adjacent atelectasis of both lower lobes. Hepatobiliary: No focal liver abnormality is seen. No gallstones, gallbladder wall thickening, or biliary dilatation. Pancreas: Unremarkable. No pancreatic ductal dilatation or surrounding inflammatory changes. Spleen: Normal in size without focal abnormality. Adrenals/Urinary Tract: Adrenal glands are unremarkable. Hyperdense areas seen throughout both kidneys are no longer visualized except for single ill-defined area in lower pole of right kidney. This may represent old infarction. Otherwise the kidneys are unremarkable. No hydronephrosis or renal obstruction is noted. Urinary bladder is unremarkable. Stomach/Bowel: The stomach appears normal. Colostomy is noted in left lower quadrant. There is no evidence of bowel obstruction.  Vascular/Lymphatic: Aortic atherosclerosis. No enlarged abdominal or pelvic lymph nodes. Reproductive: Uterus and bilateral adnexa are unremarkable. Other: Large peri-incisional hernia is noted in the epigastric region which contains a loop of transverse colon, but does not appear to be resulting in obstruction. Surgical drain is noted in the pelvis. 12.0 x 7.8 cm hematoma is noted in the right lower  quadrant which is not significantly changed compared to prior exam. 8.3 x 3.5 cm air-fluid collection is seen anterior to the uterus in the pelvis which is enlarged compared to prior exam and concerning for abscess. Mild anasarca is noted. Musculoskeletal: No acute or significant osseous findings. IMPRESSION: 1. Stable bilateral pleural effusions are noted with adjacent atelectasis of both lower lobes. 2. Hypodense areas seen throughout both kidneys on prior exam are no longer visualized except for single ill-defined area in lower pole of right kidney. This may represent old infarction. 3. Large peri-incisional hernia is noted in the epigastric region which contains a loop of transverse colon, but does not appear to be resulting in obstruction. 4. Colostomy is noted in left lower quadrant. 5. 8.3 x 3.5 cm air-fluid collection is seen anterior to the uterus in the pelvis which is enlarged compared to prior exam and concerning for abscess. Aortic Atherosclerosis (ICD10-I70.0). Electronically Signed   By: Marijo Conception M.D.   On: 11/04/2019 14:26   DG CHEST PORT 1 VIEW  Result Date: 11/06/2019 CLINICAL DATA:  Dyspnea, low oxygen saturation EXAM: PORTABLE CHEST 1 VIEW COMPARISON:  Portable exam 0813 hours compared 11/04/2019 FINDINGS: RIGHT arm PICC line tip projects over SVC. Subtotal opacification of LEFT hemithorax due to increased LEFT pleural effusion and LEFT lung atelectasis. Mild mediastinal shift to LEFT. Abrupt cut off of LEFT lower lobe bronchus question mucous plugging. RIGHT lung hyperinflated and clear. No pneumothorax or acute osseous findings. IMPRESSION: Increased LEFT pleural effusion and LEFT lung atelectasis causing subtotal opacification of the LEFT hemithorax. Question mucous plugging LEFT lower lobe. Electronically Signed   By: Lavonia Dana M.D.   On: 11/06/2019 08:19   DG Chest Port 1 View  Result Date: 11/04/2019 CLINICAL DATA:  Surgery follow-up EXAM: PORTABLE CHEST 1 VIEW COMPARISON:   10/27/2019 FINDINGS: Tip of right-sided PICC line is at the cavoatrial junction. There are hazy opacities at the medial left lung base. Suspected left pleural effusion. Right lung is clear. Normal cardiomediastinal contours. IMPRESSION: Hazy opacities at the medial left lung base, likely atelectasis. Probable small left pleural effusion. Electronically Signed   By: Ulyses Jarred M.D.   On: 11/04/2019 21:39    Anti-infectives: Anti-infectives (From admission, onward)   Start     Dose/Rate Route Frequency Ordered Stop   11/06/19 0600  ceFAZolin (ANCEF) IVPB 2g/100 mL premix     Discontinue     2 g 200 mL/hr over 30 Minutes Intravenous Every 8 hours 11/05/19 0914     11/04/19 0600  cefTRIAXone (ROCEPHIN) 2 g in sodium chloride 0.9 % 100 mL IVPB  Status:  Discontinued        2 g 200 mL/hr over 30 Minutes Intravenous Every 24 hours 11/04/19 0500 11/05/19 0914   10/31/19 1700  vancomycin (VANCOCIN) IVPB 1000 mg/200 mL premix  Status:  Discontinued        1,000 mg 200 mL/hr over 60 Minutes Intravenous Every 12 hours 10/31/19 0742 11/01/19 1032   10/31/19 1000  vancomycin (VANCOREADY) IVPB 750 mg/150 mL  Status:  Discontinued        750 mg 150 mL/hr  over 60 Minutes Intravenous Every 12 hours 10/31/19 0057 10/31/19 0742   10/31/19 0900  meropenem (MERREM) 2 g in sodium chloride 0.9 % 100 mL IVPB  Status:  Discontinued        2 g 200 mL/hr over 30 Minutes Intravenous Every 8 hours 10/31/19 0742 11/01/19 1032   10/31/19 0200  vancomycin (VANCOREADY) IVPB 1500 mg/300 mL        1,500 mg 150 mL/hr over 120 Minutes Intravenous  Once 10/31/19 0057 10/31/19 0700   10/31/19 0200  piperacillin-tazobactam (ZOSYN) IVPB 3.375 g  Status:  Discontinued        3.375 g 12.5 mL/hr over 240 Minutes Intravenous Every 8 hours 10/31/19 0057 10/31/19 0726   10/24/19 0600  piperacillin-tazobactam (ZOSYN) IVPB 3.375 g  Status:  Discontinued        3.375 g 12.5 mL/hr over 240 Minutes Intravenous Every 8 hours 10/23/19  2305 10/29/19 0932   10/24/19 0000  piperacillin-tazobactam (ZOSYN) IVPB 4.5 g  Status:  Discontinued        4.5 g 200 mL/hr over 30 Minutes Intravenous Every 6 hours 10/23/19 2257 10/23/19 2302   10/23/19 2315  piperacillin-tazobactam (ZOSYN) IVPB 3.375 g        3.375 g 100 mL/hr over 30 Minutes Intravenous STAT 10/23/19 2302 10/23/19 2347       Assessment/Plan 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 Henrene Pastor, per GI, started Solumedrol 7/27; She is going to need Biologics + immunomodulator, probably to start next week  Sigmoid colon perforation-  -  10/24/19:  partial colectomy and end colostomy by Dr. Kae Heller. POD#13. -  10/30/19: large mesenteric hematoma noted, hep gtt held -  11/04/19: fascial dehiscence with visible transverse colon: S/P exploratory laparotomy, colostomy revision, fascial closure, placement 4 retention sutures, placement of wound VAC, Dr. Georgette Dover POD#2 - Wound vac MWF - cont JP drain and monitor output - tolerating liquids and ostomy functioning  Sepsis- per primary team, off pressors  Acute hypoxic respiratory failure Protein calorie malnutrition - prealbumibn 8/2 14.2  Acute on chronic anemia- s/p 3u pRBC 7/30-8/1, Hgb down 8.3 but HR/BP stable, repeat CBC in AM Volume overload - ongoing diuresis per primary   FEN: TNA, Ensure, FLD ID: [id number]; Ancef to start 8/6 for bacteremia (1/2 blood Cx 7/31 showed E.Coli) >>day#1 VTE: SCD's, lovenox Foley: plan to D/C today 8/6 if ok with primary service   Dispo: Advance to full liquids, continue TPN. Continue therapies, mobilize. Repeat CBC in AM.   LOS: 18 days    Wellington Hampshire, [facility] Surgery 11/06/2019, 12:01 PM Please see Amion for pager number during day hours 7:00am-4:30pm

## 2019-11-06 NOTE — Progress Notes (Signed)
Pt did not want to take the rest of morning meds at this time d/t pt being very "worn out" from her wound vac dressing change. This nurse will check back with her in a while. Pt resting bed with daughter at bedside. Call bell with in reach.

## 2019-11-06 NOTE — Progress Notes (Signed)
PHARMACY - TOTAL PARENTERAL NUTRITION CONSULT NOTE   Indication: Prolonged ileus  Patient Measurements: Height: 5' 1"  (154.9 cm) Weight: 63 kg (138 lb 14.2 oz) IBW/kg (Calculated) : 47.8 TPN AdjBW (KG): 55.1 Body mass index is 26.24 kg/m.  Assessment:  31 yof with remote history of sigmoid colectomy and recent admission d/t colitis initially presented with lower extremity edema and later developed abdominal pain with decreased appetite. S/p EGD + colonoscopy on 7/21 that showed Crohn's colitis. On 7/23 a CT showed large amount pneumoperitoneum and patients underwent exlap with partial colectomy and colostomy. To start TPN for anticipated prolonged ileus.   On 7/30 repeat CT abdomen was obtained and found new large hematoma in R peritoneal cavity and patient was transferred back to the ICU for close monitoring. IR consulted for possible drainage of hematoma though not a candidate for drainage. Not requiring pressors at this time. On Cefazolin for bacteremia   Glucose / Insulin: CBGs ranging from 110-133. Received 4 unit(s) of rSSI + 10u regular insulin in TPN bag in 24 hours. Solumedrol decreased to 93m q12h. Electrolytes: Na improved to 137, Other electrolytes wnl. On lasix 40 BID  Renal: SCr stable. UOP 0.8 ml/kg/hr with 1x unmeasured output  LFTs / TGs: AST/ALT improved, Alk phos normalized. T bili normalized. TG 99 on 8/2.  Prealbumin / albumin: prealbumin increased from <5 to 14, albumin 1.9 ID: Patient is growing E.coli in 1/3 blood culture bottles. Discussed with medical team whether TPN should be held and PICC line replaced. Team thinks since this is a mild infection and E.coli does not routinely seed lines in combination with patient's degree of malnutrition, plan is to continue TPN and monitor patient closely. Of note, WBC has normalized on cefazolin Intake / Output; MIVF: 155 mL / 24 hrs drain output, 50 mL colostomy output charted  GI Imaging: 7/23 Abd xray - ileus vs SBO 7/23  CT abd - Extensive pneumoperitoneum, most consistent with colonic perforation given the persistent diffuse colitis 7/29 CT abd - scattered small fluid collections, bowel inflammation 7/30 CT abd - R peritoneal cavity hematoma (at least 8071m, dilated colostomy loop and stomach containing air  8/4: CT scan with persistent hematoma and new fluid collection in the pelvis concerning for abscess.   Surgeries / Procedures:  7/21 Colonoscopy/EGD 7/24 Exlap, partial colectomy and colostomy 8/4: Repeat Exlap   Central access: PICC placed 7/25 TPN start date: 7/25  Nutritional Goals (per RD recommendation on 7/29): kCal: 1700-1900, Protein: 90-105g, Fluid: >1.7L  Goal TPN rate is 60 mL/hr (provides 90 g of protein and 1841 kcals per day)  Current Nutrition:  Clear liquid diet  Ensure twice daily - on hold  TPN  Plan:  Continue concentrated TPN at goal rate of 6039mr at 1800 - provides 90 g of protein and 1841 kcals per day (100% goal calories).  Electrolytes in TPN (no adjustments today): Continue 150 mEq/L of Na, adjust to 41m52m of K, 5mEq73mof Ca, 6mEq/51mf Mg, and 20mmol66mf Phos. Cl:Ac ratio to 1:2 Monitor K and Mg closely as patient is being diuresed  Continue 10 units of insulin in TPN  Adjust SSI to sensitive SSI q4h and adjust as needed Add standard MVI and trace elements to TPN and monitor tbili closely Monitor TPN labs on Mon/Thurs  BenjamiAlbertina ParrD., BCPS, BCCCP Clinical Pharmacist Clinical phone for 11/06/19 until 3:30pm: [phone] 3:30pm, please refer to AMION f[facility]it-specific pharmacist

## 2019-11-06 NOTE — Social Work (Signed)
Patient care - CSW unable to complete assessment. CSW will try again later today.  CSW will continue to follow and assist with discharge planning.   Thurmond Butts, MSW, [place] Clinical Social Worker

## 2019-11-06 NOTE — Progress Notes (Signed)
NAME:  Natasha Barrera, MRN:  [id number], DOB:  1965/05/05, LOS: 32 ADMISSION DATE:  10/18/2019, CONSULTATION DATE: 10/24/19, 11/06/19 call back  REFERRING MD:  Dr. Nicoletta Dress, IMTS Resident, CHIEF COMPLAINT:  Dyspnea, Abnormal CXR   Brief History   54 y/o F admitted initially 7/18 with reports of lower extremity edema and abdominal pain.  Work up during admission revealed Chron's disease confirmed with upper and lower endoscopy.  Afternoon of 7/23 patient developed acute onset upper abdominal pain resulting in urgent CT of the abdomen that revealed large amount of pneumoperitoneum for which patient underwent exploratory laparotomy with lysis of adhesions and segmental colectomy with end colostomy by general surgery.  PCCM saw post-op for hypotension despite IV hydration + albumin.  Severe hypoalbuminemia.    Past Medical History  HTN  Chron's Disease - new dx 11/2019  Diverticulitis   Significant Hospital Events   7/18 Admit with abd pain 7/23 acute abd pain, CT with large pneumoperitoneum  7/24 Ex-lap with lysis of adhesions   Consults:  GI  CCS PCCM  Procedures:    Significant Diagnostic Tests:   CTA Chest 7/18 >> negative for acute PE, emphysema   CT Abd / pelvis 7/23 >> extensive pneumoperitoneum, interval development of focal mural thrombus within the infrarenal aorta, small bilateral pleural effusions, diffuse hepatic steatosis   Micro Data:  COVID 7/18 >> negative  C-Diff 7/20 >> negative  BCID 7/31 >> E-Coli detected  BCx2 7/31 >> E-Coli >> pan sensitive   Antimicrobials:  Per primary   Interim history/subjective:  Afebrile  Called back with concern of abnormal CXR  I/O 1.2L UOP, 200 drains, -291 in last 24 hours (remains 3.2L positive balance for last 24 hours)  Objective   Blood pressure (!) 107/54, pulse 85, temperature 98.5 F (36.9 C), temperature source Oral, resp. rate 12, height 5' 1"  (1.549 m), weight 63 kg, last menstrual period 12/15/2014, SpO2 93 %.         Intake/Output Summary (Last 24 hours) at 11/06/2019 1443 Last data filed at 11/06/2019 1147 Gross per 24 hour  Intake 1438.56 ml  Output 2055 ml  Net -616.44 ml   Filed Weights   11/04/19 1833 11/05/19 0500 11/06/19 0500  Weight: 77 kg 60.4 kg 63 kg    Examination: General:  Ill appearing adult female lying in bed in NAD HEENT: MM pink/moist, anicteric, [place] O2 5L Neuro: AAOx4, speech clear, MAE CV: s1s2 RRR, tachy, no m/r/g PULM: non-labored on [place] O2, clear anterior on R, diminished right base, bronchial breath sounds on left GI: soft, bsx4 active, midline dressing intact, JP drain  Extremities: warm/dry, 2+ BLE edema  Skin: multiple areas ecchymosis on arms  Resolved Hospital Problem list      Assessment & Plan:   Acute Hypoxic Respiratory Failure  Bilateral Pleural Effusions  Left Cut-Off / Mucus Plugging  Multifactorial hypoxic respiratory failure in setting of Chron's with pneumoperitoneum post ex-lap with lysis of adhesions, bilateral pleural effusions in setting of malnutrition / volume overload, splinting due to pain, E-Coli bacteremia / sepsis, anemia and mucus plugging. Now with near complete Cropp out of left hemithorax with left shift / volume loss.   -aggressive pulmonary hygiene - flutter, mobilize -saline nebs BID x2 days  -assess pleural effusions at bedside with [place] -follow up CXR in am  -nutritional support   Chron's Disease  S/p EGD / colonoscopy 7/21 by Dr. Henrene Pastor. Solumedrol started 7/27.  7/24 ex-lapwith partial colectomy and end colostomy by Dr. Kae Heller.  7/30 Large mesenteric hematoma on heparin gtt.  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  PCCM will be available PRN. Please call back if new needs arise.   Labs   CBC: Recent Labs  Lab 11/02/19 0521 11/03/19 0452 11/04/19 0136 11/05/19 0445 11/06/19 0725  WBC 12.4* 12.9* 11.3* 9.9 9.5  NEUTROABS 10.6*  --  10.2*  --   --   HGB 9.2* 9.8* 9.8* 10.6* 8.3*  HCT 28.2* 29.8* 29.9* 31.9* 25.9*  MCV 94.6 95.8 95.5 97.3 99.2  PLT 209 221 251 274 242    Basic Metabolic Panel: Recent Labs  Lab 10/30/19 1744 10/30/19 1744 10/31/19 0548 10/31/19 0548 11/01/19 0617 11/01/19 0617 11/02/19 0521 11/03/19 0452 11/04/19 0136 11/05/19 0445 11/06/19 0725  NA 130*   < > 133*   < > 135   < > 131* 131* 134* 135 137  K 4.2   < > 3.8   < > 3.8   < > 3.6 3.7 3.6 3.9 3.8  CL 97*   < > 101   < > 104   < > 101 101 98 100 101  CO2 25   < > 25   < > 25   < > 24 22 26 24 28   GLUCOSE 155*   < > 134*   < > 93   < > 102* 111* 127* 141* 114*  BUN 15   < > 16   < > 12   < > 10 10 13 19 19   CREATININE 0.32*   < > 0.31*   < > 0.30*   < > 0.32* <0.30* 0.43* 0.36* 0.32*  CALCIUM 7.6*   < > 7.3*   < > 7.5*   < > 7.5* 7.5* 7.8* 7.8* 7.9*  MG 2.3  --  2.2  --   --   --  2.3  --  2.1 2.1  --   PHOS  --   --  2.9  --  3.0  --  3.7  --  3.9 3.8  --    < > = values in this interval not displayed.   GFR: Estimated Creatinine Clearance: 67.6 mL/min (A) (by C-G formula based on SCr of 0.32 mg/dL (L)). Recent Labs  Lab 10/30/19 1744 10/30/19 2146 10/30/19 2330 10/31/19 0817 10/31/19 1230 11/03/19 0452 11/04/19 0136 11/05/19 0445 11/06/19 0725  WBC  --   --    < > 20.9*   < > 12.9* 11.3* 9.9 9.5  LATICACIDVEN 2.6* 3.8*  --  1.7  --   --  1.4  --   --    < > =  values in this interval not displayed.    Liver Function Tests: Recent Labs  Lab 10/31/19 0548 11/02/19 0521 11/04/19 0136 11/05/19 0445  AST 15 88* 48* 21  ALT 37 135* 238* 127*  ALKPHOS 77 150* 159* 126  BILITOT 1.1 0.7  0.7 0.7  PROT 4.1* 4.3* 4.4* 4.4*  ALBUMIN 1.5* 1.4* 1.6* 1.9*   No results for input(s): LIPASE, AMYLASE in the last 168 hours. Recent Labs  Lab 11/04/19 0136  AMMONIA 28    ABG    Component Value Date/Time   PHART 7.351 10/27/2019 0240   PCO2ART 36.7 10/27/2019 0240   PO2ART 81 (L) 10/27/2019 0240   HCO3 20.3 10/27/2019 0240   TCO2 21 (L) 10/27/2019 0240   ACIDBASEDEF 5.0 (H) 10/27/2019 0240   O2SAT 95.0 10/27/2019 0240     Coagulation Profile: Recent Labs  Lab 10/31/19 1721 11/06/19 0725  INR 1.2 1.1    Cardiac Enzymes: No results for input(s): CKTOTAL, CKMB, CKMBINDEX, TROPONINI in the last 168 hours.  HbA1C: No results found for: HGBA1C  CBG: Recent Labs  Lab 11/05/19 2007 11/05/19 2352 11/06/19 0326 11/06/19 0753 11/06/19 1143  GLUCAP 110* 140* 130* 110* 121*       Critical care time: n/a     Noe Gens, MSN, NP-C [facility] 11/06/2019, 2:43 PM   Please see [email or web address] for pager details.

## 2019-11-06 NOTE — Progress Notes (Signed)
Dr. Truman Hayward and myself went to go talk with the patient about her latest chest x ray. She reports that she is still having trouble breathing and we discussed that she might have either a mucous plug or significant enough pleural effusion around her lung that it has caused it to collapse. We discussed that we have consulted Pulmonology and they would come and examine her. We discussed the possibility of needing a chest tube to drain her pleural effusion.  She stated understanding and she requested we let her family know of this development. Dr. Truman Hayward called her husband and I called her daughter.

## 2019-11-06 NOTE — Anesthesia Postprocedure Evaluation (Signed)
Anesthesia Post Note  Patient: Larkyn D Mccuen  Procedure(s) Performed: EXPLORATORY LAPAROTOMY FOR DEHISCENCE (N/A Abdomen) APPLICATION OF WOUND VAC (N/A Abdomen)     Patient location during evaluation: PACU Anesthesia Type: General Level of consciousness: awake and oriented Pain management: pain level controlled Vital Signs Assessment: post-procedure vital signs reviewed and stable Respiratory status: spontaneous breathing and nonlabored ventilation (patient on BiPAP) Cardiovascular status: blood pressure returned to baseline and stable Postop Assessment: no apparent nausea or vomiting Anesthetic complications: no   No complications documented.  Last Vitals:  Vitals:   11/06/19 0755 11/06/19 1146  BP: 106/62 (!) 107/54  Pulse: 86 85  Resp: 13 12  Temp: 36.7 C 36.9 C  SpO2: 91% 93%    Last Pain:  Vitals:   11/06/19 1146  TempSrc: Oral  PainSc:                  Vince Ainsley COKER

## 2019-11-06 NOTE — Progress Notes (Addendum)
Progress Note  CC:   Crohn's colitis     ASSESSMENT AND PLAN:   # Severe Crohn's colitis -- complicated by sigmoid perforation, abscess, infrarenal aortic thrombus and RP hematoma.  She is s/p partial colectomy and end colostomy on 7/24 for perforation. She had dehiscence with visible transverse colon and underwent exp lap, colostomy revision, wound vac on 11/04/19.  --Getting solumedrol . Decreasing dose to 20 mg BID. She is going to need Biologics + immunomodulator, probably to start next week. Pre-treatment labs including HBV and quantiferon Gold already obtained. HBV negative. Quantiferon Gold indeterminate.( ? Due to being on steroids) TPMT ordered, results pending  --started discussions with patient and daughter about biologics and immuno modulators including purpose of drug, some associated risk factors such as infections / bone marrow suppression, frequency of infusions, expected duration of treatment ( indefinitely) and need for routine office follow up with lab monitoring.    # Macrocytic anemia --Multifactorial from RP bleed / Crohn's disease / critical illness --s/p 6 uPRBC --Hgb down some overnight from 10.6 to 8.3.  Small amount of bloody output in JP and wound vac. No blood in ostomy bag  # Elevated LFTs, new --Improved overnight. Alk phos and ALT have normalized. ALT down from 238 to 127.  --Seems a little early for LFT abnormalities related to TNA.  --Monitor for now.    SUBJECTIVE   Tolerating liquids. Feels tired but no specific complaints.    OBJECTIVE:     Vital signs in last 24 hours: Temp:  [97.5 F (36.4 C)-98.2 F (36.8 C)] 98.1 F (36.7 C) (08/06 0755) Pulse Rate:  [65-86] 86 (08/06 0755) Resp:  [10-17] 13 (08/06 0755) BP: (95-106)/(54-70) 106/62 (08/06 0755) SpO2:  [91 %-95 %] 91 % (08/06 0755) Weight:  [63 kg] 63 kg (08/06 0500) Last BM Date: 11/03/19 General:   Alert, in NAD. Daughter in room Heart:  Regular rate and rhythm.  No lower  extremity edema   Pulm: Normal respiratory effort   Abdomen:  Soft,  nondistended.  Normal bowel sounds.  Brown stool in ostomy.  approx 50 cc bloody drainage in JP. Smaller amount of bloody drainage in wound vac. Neurologic:  Alert and  oriented,  grossly normal neurologically. Psych:  Pleasant, cooperative.  Normal mood and affect.   Intake/Output from previous day: 08/05 0701 - 08/06 0700 In: 1238.6 [P.O.:100; I.V.:1138.6] Out: 1530 [VOZDG:6440; Drains:205; Stool:50] Intake/Output this shift: No intake/output data recorded.  Lab Results: Recent Labs    11/04/19 0136 11/05/19 0445 11/06/19 0725  WBC 11.3* 9.9 9.5  HGB 9.8* 10.6* 8.3*  HCT 29.9* 31.9* 25.9*  PLT 251 274 247   BMET Recent Labs    11/04/19 0136 11/05/19 0445 11/06/19 0725  NA 134* 135 137  K 3.6 3.9 3.8  CL 98 100 101  CO2 _0 GLUCOSE 127* 141* 114*  BUN _1 CREATININE 0.43* 0.36* 0.32*  CALCIUM 7.8* 7.8* 7.9*   LFT Recent Labs    11/05/19 0445  PROT 4.4*  ALBUMIN 1.9*  AST 21  ALT 127*  ALKPHOS 126  BILITOT 0.7   PT/INR Recent Labs    11/06/19 0725  LABPROT 13.5  INR 1.1   Hepatitis Panel No results for input(s): HEPBSAG, HCVAB, HEPAIGM, HEPBIGM in the last 72 hours.  CT ABDOMEN PELVIS W CONTRAST  Result Date: 11/04/2019 CLINICAL DATA:  Hematuria. EXAM: CT ABDOMEN AND PELVIS WITH CONTRAST TECHNIQUE: Multidetector CT imaging of the abdomen  and pelvis was performed using the standard protocol following bolus administration of intravenous contrast. CONTRAST:  186m OMNIPAQUE IOHEXOL 300 MG/ML  SOLN COMPARISON:  October 30, 2019. FINDINGS: Lower chest: Stable bilateral pleural effusions are noted with adjacent atelectasis of both lower lobes. Hepatobiliary: No focal liver abnormality is seen. No gallstones, gallbladder wall thickening, or biliary dilatation. Pancreas: Unremarkable. No pancreatic ductal dilatation or surrounding inflammatory changes. Spleen: Normal in size without  focal abnormality. Adrenals/Urinary Tract: Adrenal glands are unremarkable. Hyperdense areas seen throughout both kidneys are no longer visualized except for single ill-defined area in lower pole of right kidney. This may represent old infarction. Otherwise the kidneys are unremarkable. No hydronephrosis or renal obstruction is noted. Urinary bladder is unremarkable. Stomach/Bowel: The stomach appears normal. Colostomy is noted in left lower quadrant. There is no evidence of bowel obstruction. Vascular/Lymphatic: Aortic atherosclerosis. No enlarged abdominal or pelvic lymph nodes. Reproductive: Uterus and bilateral adnexa are unremarkable. Other: Large peri-incisional hernia is noted in the epigastric region which contains a loop of transverse colon, but does not appear to be resulting in obstruction. Surgical drain is noted in the pelvis. 12.0 x 7.8 cm hematoma is noted in the right lower quadrant which is not significantly changed compared to prior exam. 8.3 x 3.5 cm air-fluid collection is seen anterior to the uterus in the pelvis which is enlarged compared to prior exam and concerning for abscess. Mild anasarca is noted. Musculoskeletal: No acute or significant osseous findings. IMPRESSION: 1. Stable bilateral pleural effusions are noted with adjacent atelectasis of both lower lobes. 2. Hypodense areas seen throughout both kidneys on prior exam are no longer visualized except for single ill-defined area in lower pole of right kidney. This may represent old infarction. 3. Large peri-incisional hernia is noted in the epigastric region which contains a loop of transverse colon, but does not appear to be resulting in obstruction. 4. Colostomy is noted in left lower quadrant. 5. 8.3 x 3.5 cm air-fluid collection is seen anterior to the uterus in the pelvis which is enlarged compared to prior exam and concerning for abscess. Aortic Atherosclerosis (ICD10-I70.0). Electronically Signed   By: JMarijo ConceptionM.D.   On:  11/04/2019 14:26   DG CHEST PORT 1 VIEW  Result Date: 11/06/2019 CLINICAL DATA:  Dyspnea, low oxygen saturation EXAM: PORTABLE CHEST 1 VIEW COMPARISON:  Portable exam 0813 hours compared 11/04/2019 FINDINGS: RIGHT arm PICC line tip projects over SVC. Subtotal opacification of LEFT hemithorax due to increased LEFT pleural effusion and LEFT lung atelectasis. Mild mediastinal shift to LEFT. Abrupt cut off of LEFT lower lobe bronchus question mucous plugging. RIGHT lung hyperinflated and clear. No pneumothorax or acute osseous findings. IMPRESSION: Increased LEFT pleural effusion and LEFT lung atelectasis causing subtotal opacification of the LEFT hemithorax. Question mucous plugging LEFT lower lobe. Electronically Signed   By: MLavonia DanaM.D.   On: 11/06/2019 08:19   DG Chest Port 1 View  Result Date: 11/04/2019 CLINICAL DATA:  Surgery follow-up EXAM: PORTABLE CHEST 1 VIEW COMPARISON:  10/27/2019 FINDINGS: Tip of right-sided PICC line is at the cavoatrial junction. There are hazy opacities at the medial left lung base. Suspected left pleural effusion. Right lung is clear. Normal cardiomediastinal contours. IMPRESSION: Hazy opacities at the medial left lung base, likely atelectasis. Probable small left pleural effusion. Electronically Signed   By: KUlyses JarredM.D.   On: 11/04/2019 21:39      LOS: 18 days   PTye Savoy,NP 11/06/2019, 9:30 AM  ________________________________________________________________________  [facility] MD note:  I personally examined the patient, reviewed the data and agree with the assessment and plan described above.  Complicated hospital course with severe Crohn's colitis.  She was on solumedrol 71m BID for about two weeks, that was decreased to 355mBID two days ago and we are decreasing it further (to 2039mID) starting today. Hopefully this will help her various wounds heal.  We are looking towards starting biologic (remicade) and immunomodulator as soon as it is  safe, hopefully sometime next week.  [facility] will return on Monday AM, I am on call all weekend if there are any urgent questions or concerns before then.   DanOwens LofflerD LeBHillside Endoscopy Center LLCstroenterology Pager [phone]

## 2019-11-06 NOTE — Progress Notes (Signed)
Physical Therapy Treatment Patient Details Name: Natasha Barrera MRN: [id number] DOB: Aug 12, 1965 Today's Date: 11/06/2019    History of Present Illness Natasha Barrera is a 54 year old female with PMH significant for HTN and diverticulitis (s/p sigmoid colectomy 2015) who originally presented with complaints of LE edema with later reports of abdominal pain and decreased appetite.  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 s/p exp laparatomy for dehiscence, colostomy revision, and wound vac placement 8/5.    PT Comments    Patient declines OOB mobility today despite explanation on importance of upright/mobility for improved ventilation, strength and decreasing risk of PNA, wounds etc. States she agrees with need for mobility but then refuses. Self limiting with mobility. Agreeable to there ex in bed. Worked on breathing techniques especially pursed lip breathing as pt holds breath at times and is a mouth breather. Sp02 ranged from 79-88% on 5L/min 02 [place]. Encouraged upright posture for better lung positioning and performing HEP over weekend with family. Plan for OOB next session as tolerated. If pt not able to improve tolerance for activity, might be more appropriate for SNF. Will follow.   Follow Up Recommendations  CIR;SNF     Equipment Recommendations  Rolling walker with 5" wheels;3in1 (PT)    Recommendations for Other Services       Precautions / Restrictions Precautions Precautions: Fall;Other (comment) Precaution Comments: JP drain R flank, colostomy, wound vac, watch 02 Restrictions Weight Bearing Restrictions: No    Mobility  Bed Mobility               General bed mobility comments: Declined any mobility despite explanation on  importance/need.  Transfers                    Ambulation/Gait                 Stairs             Wheelchair Mobility    Modified Rankin (Stroke Patients Only)       Balance                                            Cognition Arousal/Alertness: Awake/alert Behavior During Therapy: Anxious Overall Cognitive Status: Impaired/Different from baseline Area of Impairment: Following commands;Problem solving;Awareness;Safety/judgement                       Following Commands: Follows one step commands with increased time Safety/Judgement: Decreased awareness of deficits Awareness: Intellectual Problem Solving: Requires verbal cues General Comments: Anxiety about mobility, requires very increased time to participate in mobility to mentally prepare self. Self limiting. Poor understanding of safety and need for mobility despite multiple explanations. "I know i know, I will do whatever," but then refuses sitting EOB or any mobility.      Exercises General Exercises - Lower Extremity Ankle Circles/Pumps: AROM;Both;10 reps;Supine Quad Sets: AROM;Both;10 reps;Supine Hip ABduction/ADduction: AAROM;Both;5 reps;Supine    General Comments General comments (skin integrity, edema, etc.): SP02 ranged from 79%-88% on 5L/min 02 . Worked on pursed lip breathing as pt holding breath with exercise and a mouth breather at baseline. RN made aware. Pitting edema present bil  feet.      Pertinent Vitals/Pain Pain Assessment: Faces Faces Pain Scale: Hurts whole lot Pain Location: abdomen Pain Descriptors / Indicators: Operative site guarding;Sore;Discomfort Pain Intervention(s): Monitored during session;Repositioned;Limited activity within patient's tolerance    Home Living                      Prior Function            PT Goals (current goals can now be found in the care plan section) Progress towards PT goals: Not progressing  toward goals - comment (self limiting, low 02)    Frequency    Min 3X/week      PT Plan Current plan remains appropriate    Co-evaluation              AM-PAC PT "6 Clicks" Mobility   Outcome Measure  Help needed turning from your back to your side while in a flat bed without using bedrails?: A Lot Help needed moving from lying on your back to sitting on the side of a flat bed without using bedrails?: A Lot Help needed moving to and from a bed to a chair (including a wheelchair)?: Total Help needed standing up from a chair using your arms (e.g., wheelchair or bedside chair)?: Total Help needed to walk in hospital room?: Total Help needed climbing 3-5 steps with a railing? : Total 6 Click Score: 8    End of Session Equipment Utilized During Treatment: Oxygen Activity Tolerance: Patient limited by fatigue;Other (comment) (self limiting) Patient left: in bed;with call bell/phone within reach;with bed alarm set;with family/visitor present Nurse Communication: Mobility status;Other (comment) (Sp02) PT Visit Diagnosis: Other abnormalities of gait and mobility (R26.89);Muscle weakness (generalized) (M62.81)     Time: 1340-1400 PT Time Calculation (min) (ACUTE ONLY): 20 min  Charges:  $Therapeutic Exercise: 8-22 mins                     Marisa Severin, PT, DPT Acute Rehabilitation Services Pager [phone] Office [place] 11/06/2019, 2:56 PM

## 2019-11-06 NOTE — Progress Notes (Signed)
Nutrition Follow-up  DOCUMENTATION CODES:   Non-severe (moderate) malnutrition in context of chronic illness  INTERVENTION:  Continue Ensure Enlive po QID after meals and bedtime, each supplement provides 350 kcal and 20 grams of protein  TPN continues; recommend only wean once pt eating/taking oral nutrition supplements to meet >60% of needs  NUTRITION DIAGNOSIS:   Moderate Malnutrition related to chronic illness (crohn's disease) as evidenced by moderate muscle depletion, moderate fat depletion; ongoing  GOAL:   Patient will meet greater than or equal to 90% of their needs; met with TPN  MONITOR:   PO intake, Supplement acceptance, Skin, Weight trends, Labs, I & O's  REASON FOR ASSESSMENT:   Malnutrition Screening Tool, Consult Assessment of nutrition requirement/status  ASSESSMENT:   [name] is a 54 year old female with past medical history of HTN, diverticulitis s/p sigmoid colectomy in 2013, ventral hernia (repaired), and prior episode of colitis, and a recent hospitalization for diarrhea, hypotension and electrolyte derangements secondary to colitis who returned nine days after leaving AMA found to have continued electrolyte derangements with new onset peripheral edema.  Pt with Inflammatory Colitis secondary to Crohn's Disease  7/24 s/p ex lap, LOA, segmental colectomy with end colostomy due to large perforation in the prior rectosigmoid anastomosis 7/25 TPN started due to anticipated prolonged ileus post op 7/31 pt found to have large mesenteric hematoma (11.3 cm) within R retroperitoneum with innumerable bil renal infarcts - no intervention at this time; pt NPO 8/4 Exploratory laparotomy for dehiscence, colostomy revision, placement of wound VAC   Diet has just been advanced back to a full liquid diet. Pt reports only consuming grape juice at breakfast this AM. Pt reports she has been trying to consume her Ensure shakes to aid in increased nutrition and  po intake. Daughter at bedside encouraging pt po intake. TPN continues at goal rate of 60 ml/hr to provide 1841 kcal and 90 grams of protein.   Labs and mediations reviewed.   Diet Order:   Diet Order            Diet full liquid Room service appropriate? Yes; Fluid consistency: Thin  Diet effective now                 EDUCATION NEEDS:   No education needs have been identified at this time  Skin:  Skin Assessment: Skin Integrity Issues: Skin Integrity Issues:: Stage I, Wound VAC, Incisions Stage I: R buttocks Wound Vac: abdomen Incisions: abdomen  Last BM:  8/6 colostomy 50 ml output  Height:   Ht Readings from Last 1 Encounters:  11/04/19 _0  (1.549 m)    Weight:   Wt Readings from Last 1 Encounters:  11/06/19 63 kg    Ideal Body Weight:  54.5 kg  BMI:  Body mass index is 26.24 kg/m.  Estimated Nutritional Needs:   Kcal:  1700-1900  Protein:  90-105 grams  Fluid:  > 1.7 L  Corrin Parker, MS, RD, LDN RD pager number/after hours weekend pager number on Amion.

## 2019-11-07 ENCOUNTER — Inpatient Hospital Stay (HOSPITAL_COMMUNITY): Payer: 59

## 2019-11-07 LAB — GLUCOSE, CAPILLARY
Glucose-Capillary: 104 mg/dL — ABNORMAL HIGH (ref 70–99)
Glucose-Capillary: 106 mg/dL — ABNORMAL HIGH (ref 70–99)
Glucose-Capillary: 113 mg/dL — ABNORMAL HIGH (ref 70–99)

## 2019-11-07 MED ORDER — TRACE MINERALS CU-MN-SE-ZN 300-55-60-3000 MCG/ML IV SOLN
INTRAVENOUS | Status: DC
  Filled 2019-11-07: qty 604.8

## 2019-11-07 NOTE — Progress Notes (Addendum)
Subjective: Interviewed patient at bedside.  She reports overall feeling better than yesterday. She reports that her breathing is a little better this morning. Was able to cough up some mucus after having chest physiotherapy.  Objective:  Vital signs in last 24 hours: Vitals:   11/06/19 2206 11/06/19 2347 11/07/19 0300 11/07/19 0500  BP:  112/63 124/72   Pulse:  78 88   Resp:  17 17   Temp:  97.6 F (36.4 C) 98.1 F (36.7 C)   TempSrc:  Oral Oral   SpO2: 94% 95% 94%   Weight:    64.2 kg  Height:       Physical Exam Vitals and nursing note reviewed.  Constitutional:      General: She is not in acute distress.    Appearance: She is not ill-appearing or toxic-appearing.  HENT:     Head: Normocephalic and atraumatic.  Cardiovascular:     Rate and Rhythm: Normal rate and regular rhythm.     Pulses:          Radial pulses are 2+ on the right side and 2+ on the left side.       Dorsalis pedis pulses are 2+ on the right side and 2+ on the left side.     Heart sounds: Normal heart sounds, S1 normal and S2 normal. No murmur heard.   Pulmonary:     Comments: Coughing up mucus during exam. Crackles though out all fields. Abdominal:     General: There is no distension.     Palpations: Abdomen is soft. There is no mass.     Tenderness: There is abdominal tenderness.  Musculoskeletal:     Right lower leg: Edema present.     Left lower leg: Edema present.     Comments: Bilateral edema up to pelvis  Neurological:     Mental Status: She is alert.  Psychiatric:        Mood and Affect: Mood normal.        Behavior: Behavior normal.        Thought Content: Thought content normal.      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   Acute Hypoxia: Was on 4 L nasal canula and satting around 93%. Was evaluated by Pulm yesterday. Was started on chest physiotherapy for mucous plugging. -Started chest physiotherapy q4 while awake -Restarted Lasix IV 40 mg BID.   Crohn's Flare/Sigmoid Colon perforation:S/Pcolectomy on 7/24.S/P exploratory Lap early morning 8/5.Closed wound with wound Vac.Continues to be onTPNfor severe protein malnutrition.There was a small amount of brown liquid in ostomy bag. -Continue advancing diet as able -Continue dressing changes on wound  -Per GI continue solumedrol to 56m IV q12 hours -Continue Protonix 40 mg daily -Per surgery continue IV methocarbamol 1 g q8   Right peritoneal hematoma/Hemorrhagic Shock:Hemoglobin today is 8.3 down from 10.6. Will recheck CBC in the afternoon. Her pressures have remained normotensivebut a little soft post exploratory lap. CT abdomin/pelvis W contrastyesterday showed that hematoma was stable. -Repeat CBC in  afternoon -Low threshold for repeat CTdue to previous hematoma with restarting Heparin   E. Coli Bacteremia:E. Coli on blood culture reflex panel. Remains afebrile and WBC has continued to downtrend9.5 today. Due to reassuring clinical presentation will continue with TPN due to severe malnutrition with reduced oral intake. Continue Cefazolin 2 g q8. -Continue Cefazolin2g IV q8(day 5) -Continue to trend WBC   Acute Delirium:Became acutely alteredat night on 8/3. Was much more alert and talkative today. -Continue to work with PT as able/comfortable  -Only short naps during the day   Renal Infarcts/Abdominal aortic thrombus:Renal functionappearsadequate.Urine  output yesterday was2700cc. -Continue Lasix IV 40 mg BID -ContinueLovenox40 mg subcutaneous -Continue monitoring renal output   Right Hydronephrosis(resolved):   Severe protein calorie malnutrition Albumin 1.6. On TPN due to GI complications.Currently onfull liquids, will continue to advance diet per surgeryand as tolerated. Reports drinking some liquids this morning and wanting to drink more, encouraged this. -Continue drinking liquids -Continue advancing diet as able   DVT prophx: SCDsandlovenoxsubcutaneous Diet:full liquids/TPN GBM:SXJD  Prior to Admission Living Arrangement: Anticipated Discharge Location: Barriers to Discharge: Dispo: Anticipated discharge in approximately 5-7 day(s).   Briant Cedar, MD 11/07/2019, 6:29 AM Pager: [phone] After 5pm on weekdays and 1pm on weekends: On Call pager [phone]

## 2019-11-07 NOTE — Progress Notes (Signed)
Pt became anxious after having chest vest physiotherapy done stated " I do not want this done at night because it scares me". Pt mentioned that she was not informed on what the benefits of the vest were.   Sharin Mons, RN

## 2019-11-07 NOTE — Progress Notes (Signed)
Pt c/o wanting the foley out and tried pulling the foley out stating "Doctors told me foley can be removed 11/06/2019".  Previous dayshift nurse reported that the doctors wanted to leave the foley in 1 more day while receiving lasix on that shift. I call Internal Medicine MD. No new orders on d/c foley.Will pass on to dayshift nurse to ask doctors.   Sharin Mons, RN

## 2019-11-07 NOTE — Progress Notes (Signed)
Patient ID: Natasha Barrera, female   DOB: February 10, 1966, 54 y.o.   MRN: [id number]   Acute Care Surgery Service Progress Note:    Chief Complaint/Subjective: No n/v A little queasy once Husband at [facility] Didn't do chest PT IS about 900  Objective: Vital signs in last 24 hours: Temp:  [97.6 F (36.4 C)-98.5 F (36.9 C)] 98.1 F (36.7 C) (08/07 0300) Pulse Rate:  [78-91] 88 (08/07 0300) Resp:  [12-18] 17 (08/07 0300) BP: (98-124)/(51-72) 124/72 (08/07 0300) SpO2:  [93 %-99 %] 99 % (08/07 0851) Weight:  [64.2 kg] 64.2 kg (08/07 0500) Last BM Date: 11/03/19  Intake/Output from previous day: 08/06 0701 - 08/07 0700 In: 700 [P.O.:700] Out: 2770 [Urine:2700; Drains:70] Intake/Output this shift: No intake/output data recorded.  Gen:  Alert, NAD, pleasant Card:  RRR Pulm:  rate and effort normal on supplemental oxygen via [place]; cough Abd: Soft, ND, appropriately tender, drain with SS fluid, ostomy viable with gas/liquid stool in pouch, Ext: LLE >RLE edema Lab Results: CBC  Recent Labs    11/06/19 0725 11/06/19 1807  WBC 9.5 12.4*  HGB 8.3* 9.0*  HCT 25.9* 27.7*  PLT 247 299   BMET Recent Labs    11/05/19 0445 11/06/19 0725  NA 135 137  K 3.9 3.8  CL 100 101  CO2 24 28  GLUCOSE 141* 114*  BUN 19 19  CREATININE 0.36* 0.32*  CALCIUM 7.8* 7.9*   LFT Hepatic Function Latest Ref Rng & Units 11/05/2019 11/04/2019 11/02/2019  Total Protein 6.5 - 8.1 g/dL 4.4(L) 4.4(L) 4.3(L)  Albumin 3.5 - 5.0 g/dL 1.9(L) 1.6(L) 1.4(L)  AST 15 - 41 U/L 21 48(H) 88(H)  ALT 0 - 44 U/L 127(H) 238(H) 135(H)  Alk Phosphatase 38 - 126 U/L 126 159(H) 150(H)  Total Bilirubin 0.3 - 1.2 mg/dL 0.7 0.7 0.7  Bilirubin, Direct 0.0 - 0.2 mg/dL - - -   PT/INR Recent Labs    11/06/19 0725  LABPROT 13.5  INR 1.1   ABG No results for input(s): PHART, HCO3 in the last 72 hours.  Invalid input(s): PCO2, PO2  Studies/Results:  Anti-infectives: Anti-infectives (From admission, onward)   Start      Dose/Rate Route Frequency Ordered Stop   11/06/19 0600  ceFAZolin (ANCEF) IVPB 2g/100 mL premix     Discontinue     2 g 200 mL/hr over 30 Minutes Intravenous Every 8 hours 11/05/19 0914     11/04/19 0600  cefTRIAXone (ROCEPHIN) 2 g in sodium chloride 0.9 % 100 mL IVPB  Status:  Discontinued        2 g 200 mL/hr over 30 Minutes Intravenous Every 24 hours 11/04/19 0500 11/05/19 0914   10/31/19 1700  vancomycin (VANCOCIN) IVPB 1000 mg/200 mL premix  Status:  Discontinued        1,000 mg 200 mL/hr over 60 Minutes Intravenous Every 12 hours 10/31/19 0742 11/01/19 1032   10/31/19 1000  vancomycin (VANCOREADY) IVPB 750 mg/150 mL  Status:  Discontinued        750 mg 150 mL/hr over 60 Minutes Intravenous Every 12 hours 10/31/19 0057 10/31/19 0742   10/31/19 0900  meropenem (MERREM) 2 g in sodium chloride 0.9 % 100 mL IVPB  Status:  Discontinued        2 g 200 mL/hr over 30 Minutes Intravenous Every 8 hours 10/31/19 0742 11/01/19 1032   10/31/19 0200  vancomycin (VANCOREADY) IVPB 1500 mg/300 mL        1,500 mg 150 mL/hr over  120 Minutes Intravenous  Once 10/31/19 0057 10/31/19 0700   10/31/19 0200  piperacillin-tazobactam (ZOSYN) IVPB 3.375 g  Status:  Discontinued        3.375 g 12.5 mL/hr over 240 Minutes Intravenous Every 8 hours 10/31/19 0057 10/31/19 0726   10/24/19 0600  piperacillin-tazobactam (ZOSYN) IVPB 3.375 g  Status:  Discontinued        3.375 g 12.5 mL/hr over 240 Minutes Intravenous Every 8 hours 10/23/19 2305 10/29/19 0932   10/24/19 0000  piperacillin-tazobactam (ZOSYN) IVPB 4.5 g  Status:  Discontinued        4.5 g 200 mL/hr over 30 Minutes Intravenous Every 6 hours 10/23/19 2257 10/23/19 2302   10/23/19 2315  piperacillin-tazobactam (ZOSYN) IVPB 3.375 g        3.375 g 100 mL/hr over 30 Minutes Intravenous STAT 10/23/19 2302 10/23/19 2347      Medications: Scheduled Meds: . acetaminophen  650 mg Oral Q6H  . Chlorhexidine Gluconate Cloth  6 each Topical Daily  .  enoxaparin (LOVENOX) injection  40 mg Subcutaneous Q24H  . feeding supplement (ENSURE ENLIVE)  237 mL Oral TID PC & HS  . furosemide  40 mg Intravenous BID  . insulin aspart  0-9 Units Subcutaneous Q4H  . lidocaine  1 patch Transdermal Q24H  . methylPREDNISolone (SOLU-MEDROL) injection  20 mg Intravenous Q12H  . pantoprazole  40 mg Oral Q0600  . sodium chloride flush  10-40 mL Intracatheter Q12H  . sodium chloride flush  3 mL Intravenous Once  . sodium chloride HYPERTONIC  4 mL Nebulization BID  . vitamin A  10,000 Units Oral Daily   Continuous Infusions: . sodium chloride Stopped (10/31/19 0453)  .  ceFAZolin (ANCEF) IV 2 g (11/07/19 0539)  . lactated ringers 10 mL/hr at 11/04/19 1835  . methocarbamol (ROBAXIN) IV 1,000 mg (11/07/19 5366)  . TPN ADULT (ION) 60 mL/hr at 11/06/19 1749  . TPN ADULT (ION)     PRN Meds:.sodium chloride, guaiFENesin, HYDROmorphone (DILAUDID) injection, ondansetron, sodium chloride flush, traMADol  Assessment/Plan: Patient Active Problem List   Diagnosis Date Noted  . E coli bacteremia 11/04/2019  . Colon perforation ([place]) 10/31/2019  . Peritoneal hematoma 10/31/2019  . 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 Acute blood loss anemia 10/31/2019  . Pressure injury of skin 10/29/2019  . Malnutrition of moderate degree ([place])   . Crohn's disease of large intestine with other complication ([place]) 44/05/4740  . Diarrhea   . Loss of appetite 10/02/2019  . Abdominal bloating 10/02/2019  . Tachycardia 10/02/2019  . Loss of weight 10/02/2019  . 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 Henrene Pastor, per GI, started Solumedrol 7/27; She is going to need Biologics + immunomodulator, probably to start next week  Sigmoid colon perforation-  - 10/24/19:partial colectomy  and end colostomy by Dr. Kae Heller. POD#13. - 10/30/19: large mesenteric hematoma noted, hep gtt held - 11/04/19:fascial dehiscence with visible transverse colon: S/P exploratory laparotomy, colostomy revision, fascial closure, placement 4 retention sutures, placement of wound VAC, Dr. Georgette Dover POD#3 - Wound vac MWF - cont JP drain and monitor output - tolerating liquids and ostomy functioning  Sepsis- per primary team, off pressors  Acute hypoxic respiratory failure Protein calorie malnutrition - prealbumibn 8/2 14.2  Acute on chronic anemia- s/p 3u pRBC 7/30-8/1, Hgb down  8.3 but HR/BP stable, cbc pending Volume overload - ongoing diuresis per primary   FEN: TNA, Ensure, adv to soft; wean TPN ID: [id number];Ancef to start 8/6 for bacteremia (1/2 blood Cx 7/31 showed E.Coli) >>day#1 VTE: SCD's, lovenox Foley: plan to D/C today 8/7 if ok with primary service  Dispo: Adv diet to soft, wean TPN. D/c foley today, Continue therapies, mobilize. Repeat CBC in AM. Disposition:  LOS: 19 days    Leighton Ruff. Redmond Pulling, MD, FACS General, Bariatric, & Minimally Invasive Surgery [phone] [facility]

## 2019-11-07 NOTE — Progress Notes (Addendum)
PHARMACY - TOTAL PARENTERAL NUTRITION CONSULT NOTE   Indication: Prolonged ileus  Patient Measurements: Height: 5' 1" (154.9 cm) Weight: 64.2 kg (141 lb 8.6 oz) IBW/kg (Calculated) : 47.8 TPN AdjBW (KG): 55.1 Body mass index is 26.74 kg/m.  Assessment:  Natasha Barrera with remote history of sigmoid colectomy and recent admission d/t colitis initially presented with lower extremity edema and later developed abdominal pain with decreased appetite. S/p EGD + colonoscopy on 7/21 that showed Crohn's colitis. On 7/23 a CT showed large amount pneumoperitoneum and patients underwent exlap with partial colectomy and colostomy. To start TPN for anticipated prolonged ileus.   On 7/30 repeat CT abdomen was obtained and found new large hematoma in R peritoneal cavity and patient was transferred back to the ICU for close monitoring. IR consulted for possible drainage of hematoma though not a candidate for drainage. Not requiring pressors at this time. On Cefazolin for bacteremia.  Glucose / Insulin: CBGs ranging from 106-145. Received no unit(s) of rSSI + 10u regular insulin in TPN bag in 24 hours. Solumedrol decreased to 38m q12h. Electrolytes: Na improved to 137, Other electrolytes wnl. On lasix 40 IV BID  Renal: SCr stable. UOP 1.8 ml/kg/hr with 1x unmeasured output  LFTs / TGs: AST/ALT improved, Alk phos normalized. T bili normalized. TG 99 on 8/2.  Prealbumin / albumin: prealbumin increased from <5 to 14, albumin 1.9 ID: Patient is growing E.coli in 1/3 blood culture bottles. Discussed with medical team whether TPN should be held and PICC line replaced. Team thinks since this is a mild infection and E.coli does not routinely seed lines in combination with patient's degree of malnutrition, plan is to continue TPN and monitor patient closely. Of note, WBC has normalized on cefazolin Intake / Output; MIVF: 70 mL / 24 hrs drain output, no colostomy output charted   GI Imaging: 7/23 Abd xray - ileus vs  SBO 7/23 CT abd - Extensive pneumoperitoneum, most consistent with colonic perforation given the persistent diffuse colitis 7/29 CT abd - scattered small fluid collections, bowel inflammation 7/30 CT abd - R peritoneal cavity hematoma (at least 8043m, dilated colostomy loop and stomach containing air  8/4: CT scan with persistent hematoma and new fluid collection in the pelvis concerning for abscess.   Surgeries / Procedures:  7/21 Colonoscopy/EGD 7/24 Exlap, partial colectomy and colostomy 8/4: Repeat Exlap   Central access: PICC placed 7/25 TPN start date: 7/25  Nutritional Goals (per RD recommendation on 8/6): kCal: 1700-1900, Protein: 90-105g, Fluid: >1.7L  Goal TPN rate is 60 mL/hr (provides 90 g of protein and 1841 kcals per day)  Current Nutrition:  Full liquid diet - poor appetite noted  Ensure Enlive QID - 2 charted as given, 2 refused (40 g protein, 700 kcal) TPN  Plan:  Continue concentrated TPN at goal rate of 6054mr at 1800 - provides 90 g of protein and 1841 kcals per day (100% goal calories).  Electrolytes in TPN (no adjustments today): Continue 150 mEq/L of Na, adjust to 55m24m of K, 5mEq40mof Ca, 6mEq/65mf Mg, and 20mmol59mf Phos. Cl:Ac ratio to 1:2 Monitor K and Mg closely as patient is being diuresed  Remove insulin from TPN  Continue SSI to sensitive SSI q4h and adjust as needed Add standard MVI and trace elements to TPN and monitor tbili closely Monitor TPN labs on Mon/Thurs  RD recommends to wean once eating/oral nutrition is >60% needs. She consumed <60% yesterday. F/U ability to begin weaning TPN.  Thank you for involving  pharmacy in this patient's care.  Renold Genta, PharmD, BCPS Clinical Pharmacist Clinical phone for 11/07/2019 until 3p is [phone] 11/07/2019 7:20 AM  **Pharmacist phone directory can be found on [email or web address] listed under [place]**   Addendum: Received consult to wean TPN. Advanced to soft diet. Spoke to RN and plan is to try  soft diet for lunch. Will decrease TPN to 30 ml/hr for 2h at 16:00 then off. Discontinued TPN previously written for today.  Renold Genta, PharmD, BCPS 10:52 AM

## 2019-11-07 NOTE — Progress Notes (Signed)
RT NOTES: Patient refusing chest vest at this time, says it hurts too bad. Patient is using flutter valve and has a productive cough.

## 2019-11-08 DIAGNOSIS — K631 Perforation of intestine (nontraumatic): Secondary | ICD-10-CM | POA: Diagnosis not present

## 2019-11-08 LAB — CBC
HCT: 27.3 % — ABNORMAL LOW (ref 36.0–46.0)
Hemoglobin: 8.8 g/dL — ABNORMAL LOW (ref 12.0–15.0)
MCH: 31.1 pg (ref 26.0–34.0)
MCHC: 32.2 g/dL (ref 30.0–36.0)
MCV: 96.5 fL (ref 80.0–100.0)
Platelets: 305 10*3/uL (ref 150–400)
RBC: 2.83 MIL/uL — ABNORMAL LOW (ref 3.87–5.11)
RDW: 19.2 % — ABNORMAL HIGH (ref 11.5–15.5)
WBC: 9.7 10*3/uL (ref 4.0–10.5)
nRBC: 0 % (ref 0.0–0.2)

## 2019-11-08 LAB — BASIC METABOLIC PANEL
Anion gap: 10 (ref 5–15)
BUN: 16 mg/dL (ref 6–20)
CO2: 25 mmol/L (ref 22–32)
Calcium: 7.8 mg/dL — ABNORMAL LOW (ref 8.9–10.3)
Chloride: 96 mmol/L — ABNORMAL LOW (ref 98–111)
Creatinine, Ser: <0.3 mg/dL — ABNORMAL LOW (ref 0.44–1.00)
Glucose, Bld: 79 mg/dL (ref 70–99)
Potassium: 4 mmol/L (ref 3.5–5.1)
Sodium: 131 mmol/L — ABNORMAL LOW (ref 135–145)

## 2019-11-08 LAB — GLUCOSE, CAPILLARY
Glucose-Capillary: 82 mg/dL (ref 70–99)
Glucose-Capillary: 96 mg/dL (ref 70–99)
Glucose-Capillary: 99 mg/dL (ref 70–99)

## 2019-11-08 MED ORDER — HYDROMORPHONE HCL 1 MG/ML IJ SOLN
0.5000 mg | INTRAMUSCULAR | Status: DC | PRN
  Administered 2019-11-08 – 2019-11-09 (×2): 1 mg via INTRAVENOUS
  Filled 2019-11-08 (×2): qty 1

## 2019-11-08 NOTE — Progress Notes (Signed)
Patient improved cognitively from the previous night maintained A&Ox4 throughout the night. I also was able to wean pts o2 [place] from 4L to 2L. Patients Sats stayed between 94-98%.   Sharin Mons, RN

## 2019-11-08 NOTE — Progress Notes (Signed)
Subjective: Interviewed patient at bedside.  She mentions having continued improvement in her respiratory status. She continues to make sputum. Denies any significant acute event overnight.   Objective:  Vital signs in last 24 hours: Vitals:   11/07/19 2115 11/07/19 2302 11/08/19 0302 11/08/19 0500  BP:  99/65 101/71   Pulse: 79 64 80   Resp: 17 14 15    Temp:  98.5 F (36.9 C) 98.2 F (36.8 C)   TempSrc:  Oral Oral   SpO2: 94% 91% 94%   Weight:    58.4 kg  Height:       Physical Exam Vitals and nursing note reviewed.  Constitutional:      General: She is not in acute distress.    Appearance: Normal appearance. She is not ill-appearing or toxic-appearing.  HENT:     Head: Normocephalic and atraumatic.  Cardiovascular:     Rate and Rhythm: Normal rate and regular rhythm.     Pulses: Normal pulses.          Radial pulses are 2+ on the right side and 2+ on the left side.       Dorsalis pedis pulses are 2+ on the right side and 2+ on the left side.     Heart sounds: Normal heart sounds, S1 normal and S2 normal. No murmur heard.   Pulmonary:     Effort: Pulmonary effort is normal. No respiratory distress.     Breath sounds: Normal breath sounds. No wheezing.  Abdominal:     General: There is no distension.     Palpations: Abdomen is soft. There is no mass.     Tenderness: There is abdominal tenderness.  Musculoskeletal:     Right lower leg: 2+ Edema present.     Left lower leg: 2+ Edema present.     Comments: Edema in feet is improving but is still present up to pelvis bilaterally  Neurological:     Mental Status: She is alert.  Psychiatric:        Behavior: Behavior normal.        Thought Content: Thought content normal.      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   Acute Hypoxia:Was on 2 L nasal canula and satting around 93%. Has continued chest physiotherapy for mucous plugging. Still coughing up mucus. -Continue chest physiotherapy q4 while awake -Continue Lasix IV 40 mg BID.   Crohn's Flare/Sigmoid Colon perforation:S/Pcolectomy on 7/24.S/P exploratory [place] morning8/5.Closed wound with wound Vac. Skin has not broken down.TPNwas stopped due to risk of infection. There was a moderate amount of brown liquid in ostomy bag. -Continue advancing diet as able -Continue dressing changes on wound  -Per GI continuesolumedrol to37m IV q12 hours -Continue Protonix 40 mg daily -Per surgerycontinueIV methocarbamol 1 g q8   Right peritoneal hematoma/Hemorrhagic Shock:Hemoglobintoday is8.8 up from 8.3.Her pressures have remained normotensive. -Continue to trend CBC -Low threshold for repeat CTdue to previous hematoma with restarting Heparin   E. Coli Bacteremia:E. Coli on blood culture reflex panel.Remains afebrile  and WBC is9.7today. Due to risk of infection with TPN have stopped. Cefazolin has been stopped. -Continue to trend WBC   Acute Delirium:Became acutely alteredat night on 8/3.Has continued to have normal mentation. Encouraged family staying as much as possible with her. -Continue to work with PT as able/comfortable -Only short naps during the day   Renal Infarcts/Abdominal aortic thrombus:Renal functionappearsadequate.Urine output yesterday was3000cc. -Continue Lasix IV 40 mg BID -ContinueLovenox40 mg subcutaneous -Continue monitoring renal output   Right  Hydronephrosis(resolved):   Severe protein calorie malnutrition Albumin was 1.9 on 8/5. TPN had to be stopped due to risk of infections.Currently onfull liquids, will continue to advance diet per surgeryand as tolerated.Reports drinking some liquids and ensure, encouraged to drink as much as able. Patient asked about drinking smoothies which we encouraged. -Continue drinking liquids -Continue advancing diet as able   DVT prophx: SCDsandlovenoxsubcutaneous Diet:full liquids GNF:AOZH   Prior to Admission Living Arrangement: Home Anticipated Discharge Location: SNF Barriers to Discharge: Complex Medical Issues Dispo: Anticipated discharge in approximately 5-7 day(s).    Briant Cedar, MD 11/08/2019, 5:58 AM Pager: [phone] After 5pm on weekdays and 1pm on weekends: On Call pager [phone]

## 2019-11-08 NOTE — Progress Notes (Signed)
RT NOTES: Patient refusing chest vest, says it hurts too bad. Patient is using flutter and has a productive cough.

## 2019-11-08 NOTE — Progress Notes (Signed)
Patient ID: Natasha Barrera, female   DOB: 07-Feb-1966, 54 y.o.   MRN: [id number]   Acute Care Surgery Service Progress Note:    Chief Complaint/Subjective: Doing "OK."  Not having pain.  Still having a bit of productive cough.  Working on IS.    Objective: Vital signs in last 24 hours: Temp:  [98 F (36.7 C)-98.5 F (36.9 C)] 98.2 F (36.8 C) (08/08 0747) Pulse Rate:  [64-105] 80 (08/08 0747) Resp:  [13-20] 20 (08/08 0747) BP: (99-117)/(65-88) 117/75 (08/08 0747) SpO2:  [91 %-99 %] 91 % (08/08 0829) Weight:  [58.4 kg] 58.4 kg (08/08 0500) Last BM Date: 11/08/19  Intake/Output from previous day: 08/07 0701 - 08/08 0700 In: 2697.4 [P.O.:160; I.V.:1337.4; IV Piggyback:1200] Out: 54 [Urine:3000; Drains:40; Stool:100] Intake/Output this shift: Total I/O In: 120 [P.O.:120] Out: 900 [Drains:900]  Gen:  Alert, NAD, pleasant Card:  RRR Pulm:  rate and effort normal on supplemental oxygen via [place]; cough Abd: Soft, ND, appropriately tender, drain with SS fluid, ostomy viable with gas/liquid stool in pouch.  Vac in place with retention sutures/red rubber catheters.   Ext:BLE edema  Lab Results: CBC  Recent Labs    11/06/19 1807 11/08/19 0719  WBC 12.4* 9.7  HGB 9.0* 8.8*  HCT 27.7* 27.3*  PLT 299 305   BMET Recent Labs    11/06/19 0725 11/08/19 0719  NA 137 131*  K 3.8 4.0  CL 101 96*  CO2 28 25  GLUCOSE 114* 79  BUN 19 16  CREATININE 0.32* <0.30*  CALCIUM 7.9* 7.8*   LFT Hepatic Function Latest Ref Rng & Units 11/05/2019 11/04/2019 11/02/2019  Total Protein 6.5 - 8.1 g/dL 4.4(L) 4.4(L) 4.3(L)  Albumin 3.5 - 5.0 g/dL 1.9(L) 1.6(L) 1.4(L)  AST 15 - 41 U/L 21 48(H) 88(H)  ALT 0 - 44 U/L 127(H) 238(H) 135(H)  Alk Phosphatase 38 - 126 U/L 126 159(H) 150(H)  Total Bilirubin 0.3 - 1.2 mg/dL 0.7 0.7 0.7  Bilirubin, Direct 0.0 - 0.2 mg/dL - - -   PT/INR Recent Labs    11/06/19 0725  LABPROT 13.5  INR 1.1   ABG No results for input(s): PHART, HCO3 in the last 72  hours.  Invalid input(s): PCO2, PO2  Studies/Results:  Anti-infectives: Anti-infectives (From admission, onward)   Start     Dose/Rate Route Frequency Ordered Stop   11/06/19 0600  ceFAZolin (ANCEF) IVPB 2g/100 mL premix  Status:  Discontinued        2 g 200 mL/hr over 30 Minutes Intravenous Every 8 hours 11/05/19 0914 11/08/19 1133   11/04/19 0600  cefTRIAXone (ROCEPHIN) 2 g in sodium chloride 0.9 % 100 mL IVPB  Status:  Discontinued        2 g 200 mL/hr over 30 Minutes Intravenous Every 24 hours 11/04/19 0500 11/05/19 0914   10/31/19 1700  vancomycin (VANCOCIN) IVPB 1000 mg/200 mL premix  Status:  Discontinued        1,000 mg 200 mL/hr over 60 Minutes Intravenous Every 12 hours 10/31/19 0742 11/01/19 1032   10/31/19 1000  vancomycin (VANCOREADY) IVPB 750 mg/150 mL  Status:  Discontinued        750 mg 150 mL/hr over 60 Minutes Intravenous Every 12 hours 10/31/19 0057 10/31/19 0742   10/31/19 0900  meropenem (MERREM) 2 g in sodium chloride 0.9 % 100 mL IVPB  Status:  Discontinued        2 g 200 mL/hr over 30 Minutes Intravenous Every 8 hours 10/31/19 0742 11/01/19  1032   10/31/19 0200  vancomycin (VANCOREADY) IVPB 1500 mg/300 mL        1,500 mg 150 mL/hr over 120 Minutes Intravenous  Once 10/31/19 0057 10/31/19 0700   10/31/19 0200  piperacillin-tazobactam (ZOSYN) IVPB 3.375 g  Status:  Discontinued        3.375 g 12.5 mL/hr over 240 Minutes Intravenous Every 8 hours 10/31/19 0057 10/31/19 0726   10/24/19 0600  piperacillin-tazobactam (ZOSYN) IVPB 3.375 g  Status:  Discontinued        3.375 g 12.5 mL/hr over 240 Minutes Intravenous Every 8 hours 10/23/19 2305 10/29/19 0932   10/24/19 0000  piperacillin-tazobactam (ZOSYN) IVPB 4.5 g  Status:  Discontinued        4.5 g 200 mL/hr over 30 Minutes Intravenous Every 6 hours 10/23/19 2257 10/23/19 2302   10/23/19 2315  piperacillin-tazobactam (ZOSYN) IVPB 3.375 g        3.375 g 100 mL/hr over 30 Minutes Intravenous STAT 10/23/19 2302  10/23/19 2347      Medications: Scheduled Meds: . acetaminophen  650 mg Oral Q6H  . Chlorhexidine Gluconate Cloth  6 each Topical Daily  . enoxaparin (LOVENOX) injection  40 mg Subcutaneous Q24H  . feeding supplement (ENSURE ENLIVE)  237 mL Oral TID PC & HS  . furosemide  40 mg Intravenous BID  . lidocaine  1 patch Transdermal Q24H  . methylPREDNISolone (SOLU-MEDROL) injection  20 mg Intravenous Q12H  . pantoprazole  40 mg Oral Q0600  . sodium chloride flush  10-40 mL Intracatheter Q12H  . sodium chloride flush  3 mL Intravenous Once  . vitamin A  10,000 Units Oral Daily   Continuous Infusions: . sodium chloride Stopped (10/31/19 0453)  . lactated ringers 10 mL/hr at 11/04/19 1835  . methocarbamol (ROBAXIN) IV 1,000 mg (11/08/19 0607)   PRN Meds:.sodium chloride, guaiFENesin, HYDROmorphone (DILAUDID) injection, ondansetron, sodium chloride flush, traMADol  Assessment/Plan: Patient Active Problem List   Diagnosis Date Noted  . E coli bacteremia 11/04/2019  . Colon perforation ([place]) 10/31/2019  . Peritoneal hematoma 10/31/2019  . 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 Acute blood loss anemia 10/31/2019  . Pressure injury of skin 10/29/2019  . Malnutrition of moderate degree ([place])   . Crohn's disease of large intestine with other complication ([place]) 15/40/0867  . Diarrhea   . Loss of appetite 10/02/2019  . Abdominal bloating 10/02/2019  . Tachycardia 10/02/2019  . Loss of weight 10/02/2019  . 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 Henrene Pastor, per GI, started Solumedrol 7/27; She is going to need Biologics + immunomodulator  Sigmoid colon perforation-  - 10/24/19:partial colectomy and end colostomy by Dr. Kae Heller.  - 10/30/19: large mesenteric hematoma noted, hep gtt held -  11/04/19:fascial dehiscence with visible transverse colon: S/P exploratory laparotomy, colostomy revision, fascial closure, placement 4 retention sutures, placement of wound VAC, Dr. Georgette Dover  - Wound vac MWF - cont JP drain and monitor output - tolerating liquids and ostomy functioning  Sepsis- per primary team, off pressors  Acute hypoxic respiratory failure- extubated, still working on cough.  Off oxygen, but sats 91-94%.   Protein calorie malnutrition - prealbumin 8/2 14.2.  Recheck tomorrow.   Acute on chronic anemia- s/p 3u pRBC 7/30-8/1, Hgb down 8.3 but HR/BP stable, cbc pending Volume overload - ongoing  diuresis per primary   FEN: TNA, Ensure, adv to soft; wean TPN ID: [id number];Ancef to start 8/6 for bacteremia (1/2 blood Cx 7/31 showed E.Coli) >>day#1.  WBCs down to 9.7k VTE: SCD's, lovenox Foley: plan to D/C today 8/7 if ok with primary service  Dispo: Adv diet to soft, wean TPN. Continue therapies, mobilize. Work toward discharge.  Will need HH PT and [facility] nursing.  Possibly [facility] OT as well.    Hopefully d/c sometime this week.       LOS: 20 days

## 2019-11-08 NOTE — Progress Notes (Signed)
Pt continues to refuse chest vest. Pt states she does not want to use due to hurting her chest. Chest vest removed from room at this time. Pt still using flutter valve, and has a productive cough. RT will continue to monitor

## 2019-11-09 DIAGNOSIS — E46 Unspecified protein-calorie malnutrition: Secondary | ICD-10-CM | POA: Diagnosis not present

## 2019-11-09 DIAGNOSIS — E8809 Other disorders of plasma-protein metabolism, not elsewhere classified: Secondary | ICD-10-CM | POA: Diagnosis not present

## 2019-11-09 DIAGNOSIS — K631 Perforation of intestine (nontraumatic): Secondary | ICD-10-CM | POA: Diagnosis not present

## 2019-11-09 DIAGNOSIS — K50118 Crohn's disease of large intestine with other complication: Secondary | ICD-10-CM | POA: Diagnosis not present

## 2019-11-09 LAB — BASIC METABOLIC PANEL
Anion gap: 7 (ref 5–15)
BUN: 16 mg/dL (ref 6–20)
CO2: 26 mmol/L (ref 22–32)
Calcium: 8 mg/dL — ABNORMAL LOW (ref 8.9–10.3)
Chloride: 98 mmol/L (ref 98–111)
Creatinine, Ser: <0.3 mg/dL — ABNORMAL LOW (ref 0.44–1.00)
Glucose, Bld: 84 mg/dL (ref 70–99)
Potassium: 3.8 mmol/L (ref 3.5–5.1)
Sodium: 131 mmol/L — ABNORMAL LOW (ref 135–145)

## 2019-11-09 LAB — CBC
HCT: 28.3 % — ABNORMAL LOW (ref 36.0–46.0)
Hemoglobin: 9.4 g/dL — ABNORMAL LOW (ref 12.0–15.0)
MCH: 32.4 pg (ref 26.0–34.0)
MCHC: 33.2 g/dL (ref 30.0–36.0)
MCV: 97.6 fL (ref 80.0–100.0)
Platelets: 322 10*3/uL (ref 150–400)
RBC: 2.9 MIL/uL — ABNORMAL LOW (ref 3.87–5.11)
RDW: 18.8 % — ABNORMAL HIGH (ref 11.5–15.5)
WBC: 6.4 10*3/uL (ref 4.0–10.5)
nRBC: 0 % (ref 0.0–0.2)

## 2019-11-09 MED ORDER — ESCITALOPRAM OXALATE 10 MG PO TABS
10.0000 mg | ORAL_TABLET | Freq: Every day | ORAL | Status: DC
  Administered 2019-11-10 – 2019-11-23 (×12): 10 mg via ORAL
  Filled 2019-11-09 (×13): qty 1

## 2019-11-09 MED ORDER — METHOCARBAMOL 500 MG PO TABS
1000.0000 mg | ORAL_TABLET | Freq: Three times a day (TID) | ORAL | Status: DC
  Administered 2019-11-09 – 2019-11-23 (×41): 1000 mg via ORAL
  Filled 2019-11-09 (×41): qty 2

## 2019-11-09 MED ORDER — HYDROMORPHONE HCL 1 MG/ML IJ SOLN
0.5000 mg | INTRAMUSCULAR | Status: DC | PRN
  Administered 2019-11-09: 1 mg via INTRAVENOUS
  Administered 2019-11-10: 0.5 mg via INTRAVENOUS
  Filled 2019-11-09 (×2): qty 1

## 2019-11-09 NOTE — Progress Notes (Signed)
Subjective: Interviewed patient at bedside.  In obvious pain and she is scared due to her current issue with her wound vac and dressing.  She continues to cough up mucus, states her breathing is slightly getting better, hurts tremendously too  Discussed her mood, says she "needs something" and is willing to try something.  Objective:  Vital signs in last 24 hours: Vitals:   11/08/19 2329 11/09/19 0018 11/09/19 0345 11/09/19 0500  BP: 105/68  110/74   Pulse: 70 95 79   Resp: 16  14   Temp: 98.1 F (36.7 C)  98.5 F (36.9 C)   TempSrc: Oral  Oral   SpO2:  97% 93%   Weight:    54 kg  Height:       Physical Exam Vitals and nursing note reviewed.  Constitutional:      General: She is in acute distress (due to wound dressing being changed).     Appearance: Normal appearance. She is not ill-appearing or toxic-appearing.  HENT:     Head: Normocephalic and atraumatic.  Cardiovascular:     Rate and Rhythm: Normal rate and regular rhythm.     Pulses: Normal pulses.          Radial pulses are 2+ on the right side and 2+ on the left side.       Dorsalis pedis pulses are 2+ on the right side and 2+ on the left side.     Heart sounds: Normal heart sounds, S1 normal and S2 normal. No murmur heard.   Pulmonary:     Effort: Pulmonary effort is normal. No respiratory distress.     Breath sounds: Normal breath sounds. No wheezing.  Abdominal:     General: There is no distension.     Palpations: There is no mass.     Tenderness: There is abdominal tenderness.  Musculoskeletal:     Right lower leg: 2+ Edema present.     Left lower leg: 2+ Edema present.     Comments: Bilateral edema extends to up to the pelvis.      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   Adjustment Disorder with Depressed Mood: Has been tearful during previous few days. Due to severe medical issues, multiple complications and extended hospital stay, and projected protracted recovery mental health is at risk. Discussed thoughts about starting a medication patient tearfully agreed with whatever might help. Careful selection of medication needed due to multiple health issues, potential interactions with other medications, and time to onset. Escitalopram has short onset of approximately 1 week. It does not prolong QT. It also has minimal interaction with multiple CYP proteins. -Have started Escitalopram 10 mg daily -Will monitor for Serotonin Syndrome due to concurrent Tramadol use   Acute Hypoxia:Was on2L nasal canula and satting around 91-95%.Has continued chest physiotherapy for mucous plugging. Still coughing up mucus. -Continue chest physiotherapy q4 while awake -Continue Lasix IV 40 mg BID.   Crohn's Flare/Sigmoid Colon perforation:S/Pcolectomy on 7/24.S/P exploratory [place] morning8/5.Closed wound with wound Vac.  Skin has not broken down.TPNwas stopped due to risk of infection. Ostomy bag had been removed while interviewing patient due to wound care addressing bandage changing. -Continue advancing diet as able -Continue dressing changes on wound  -Per GIcontinuesolumedrol to55m IV q12 hours -Continue Protonix 40 mg daily -Per surgerycontinueIV methocarbamol 1 g q8   Right peritoneal hematoma/Hemorrhagic Shock:Hemoglobintoday is9.4 up from 8.8.Her pressures have remained normotensive. -Continue to trend CBC -Low threshold for repeat CTdue to previous  hematoma with restarting Heparin   E. Coli Bacteremia:E. Coli on blood culture reflex panel.Remains afebrile and WBC is6.4.today. Due to risk of infection with TPN have stopped. Cefazolin has been stopped. -Continue to trend WBC   Acute Delirium:Became acutely alteredat night on 8/3.Has continued to have normal mentation. Encouraged family staying as much as possible with her. -Continue to work with PT as able/comfortable -Only short naps during the day   Renal Infarcts/Abdominal aortic thrombus:Renal functionappearsadequate.Urine output yesterday was1100cc. -ContinueLasix IV 40 mg BID -ContinueLovenox40 mg subcutaneous -Continue monitoring renal output   Right Hydronephrosis(resolved):   Severe protein calorie malnutrition Albumin was 1.9 on 8/5. TPN had to be stopped due to risk of infections.Currently onfull liquids, will continue to advance diet per surgeryand as tolerated.Reports drinking some liquids and ensure, encouraged to drink as much as able. Patient asked about drinking smoothies which we encouraged. -Continue drinking liquids -Continue advancing diet as able   DVT prophx: SCDsandlovenoxsubcutaneous Diet:full liquids IGNF:AOZH  Prior to Admission Living Arrangement: Home Anticipated Discharge Location: SNF Barriers to Discharge: Complex Medical Issues Dispo: Anticipated discharge in approximately 5-7 day(s).   PBriant Cedar MD 11/09/2019, 6:10 AM Pager: [phone] 5pm on weekdays and 1pm on weekends: On Call pager [phone]

## 2019-11-09 NOTE — Consult Note (Addendum)
WOC Nurse Consult follow-up Note: Pt medicated for pain prior to the procedure.  Current ostomy pouch has leaked large amt liquid brown stool into the Vac dressing and wound is soiled. Surgical PA at the bedside to assess wound appearance. Pt has red rubber retention dressings in place which are very painful.  She is tearful and requests that Vac dressing remian off after it is removed.  Full thickness post-op abd wound is red and moist, 13X3X1cm, no odor, small amt pink drainage Dressing procedure/placement/frequency: Vac discontinued and moist gauze dressing applied.  Pt tolerated with mod amt discomfort.   [place] Nurse ostomy follow up Stoma type/location: Stoma is red and viable, flush with skin level, 1 1/2 inches.   Peristomal assessment: Intact skin surrounding Treatment options for stomal/peristomal skin: Applied barrier ring and one piece pouch to attempt to maintain a seal.  Pt and husband watched pouch application process and asked appropriate questions.  Stoma is located in close proximity to the open wound; pt may benefit from use of a belt to hold in place after discharge.  Supplies ordered to the room for staff nurse use.   Output: mod amt liquid brown stool Ostomy pouching: 1pc.  Enrolled patient in [facility] Discharge program: Not yet. Julien Girt MSN, RN, [place], [place], [place]

## 2019-11-09 NOTE — Progress Notes (Signed)
Patient resting comfortably on room air. No respiratory distress noted. BIPAP not needed at this time. RT will monitor as needed. ?

## 2019-11-09 NOTE — Progress Notes (Signed)
[facility] Progress Note  5 Days Post-Op  Subjective: CC-  Main complaint is wound vac. Leaked over the weekend. Colostomy pouch leaking into vac. Otherwise making slow progress. Denies n/v. Tolerating soft diet. Family bringing in [place] that she likes. Also drinking Ensure WBC 6.4, afebrile  Objective: Vital signs in last 24 hours: Temp:  [97.8 F (36.6 C)-98.5 F (36.9 C)] 98.2 F (36.8 C) (08/09 0727) Pulse Rate:  [70-95] 76 (08/09 0727) Resp:  [14-20] 20 (08/09 0727) BP: (96-112)/(59-74) 112/74 (08/09 0727) SpO2:  [91 %-97 %] 91 % (08/09 0727) Weight:  [54 kg] 54 kg (08/09 0500) Last BM Date: 11/08/19  Intake/Output from previous day: 08/08 0701 - 08/09 0700 In: 480 [P.O.:480] Out: 1305 [Urine:1100; Drains:5; Stool:200] Intake/Output this shift: No intake/output data recorded.  PE: Gen:  Alert, NAD, pleasant Card:  RRR Pulm:  rate and effort normal on supplemental oxygen via Nitro Abd: Soft, ND, appropriately tender, drain with scant dark SS fluid, ostomy viable with gas/liquid stool in pouch, open midline incision pictured below with retention sutures in place      Lab Results:  Recent Labs    11/08/19 0719 11/09/19 0446  WBC 9.7 6.4  HGB 8.8* 9.4*  HCT 27.3* 28.3*  PLT 305 322   BMET Recent Labs    11/08/19 0719 11/09/19 0446  NA 131* 131*  K 4.0 3.8  CL 96* 98  CO2 25 26  GLUCOSE 79 84  BUN 16 16  CREATININE <0.30* <0.30*  CALCIUM 7.8* 8.0*   PT/INR No results for input(s): LABPROT, INR in the last 72 hours. CMP     Component Value Date/Time   NA 131 (L) 11/09/2019 0446   K 3.8 11/09/2019 0446   CL 98 11/09/2019 0446   CO2 26 11/09/2019 0446   GLUCOSE 84 11/09/2019 0446   BUN 16 11/09/2019 0446   CREATININE <0.30 (L) 11/09/2019 0446   CALCIUM 8.0 (L) 11/09/2019 0446   PROT 4.4 (L) 11/05/2019 0445   ALBUMIN 1.9 (L) 11/05/2019 0445   AST 21 11/05/2019 0445   ALT 127 (H) 11/05/2019 0445   ALKPHOS 126 11/05/2019 0445    BILITOT 0.7 11/05/2019 0445   GFRNONAA NOT CALCULATED 11/09/2019 0446   GFRAA NOT CALCULATED 11/09/2019 0446   Lipase     Component Value Date/Time   LIPASE 18 09/19/2019 1631       Studies/Results: No results found.  Anti-infectives: Anti-infectives (From admission, onward)   Start     Dose/Rate Route Frequency Ordered Stop   11/06/19 0600  ceFAZolin (ANCEF) IVPB 2g/100 mL premix  Status:  Discontinued        2 g 200 mL/hr over 30 Minutes Intravenous Every 8 hours 11/05/19 0914 11/08/19 1133   11/04/19 0600  cefTRIAXone (ROCEPHIN) 2 g in sodium chloride 0.9 % 100 mL IVPB  Status:  Discontinued        2 g 200 mL/hr over 30 Minutes Intravenous Every 24 hours 11/04/19 0500 11/05/19 0914   10/31/19 1700  vancomycin (VANCOCIN) IVPB 1000 mg/200 mL premix  Status:  Discontinued        1,000 mg 200 mL/hr over 60 Minutes Intravenous Every 12 hours 10/31/19 0742 11/01/19 1032   10/31/19 1000  vancomycin (VANCOREADY) IVPB 750 mg/150 mL  Status:  Discontinued        750 mg 150 mL/hr over 60 Minutes Intravenous Every 12 hours 10/31/19 0057 10/31/19 0742   10/31/19 0900  meropenem (MERREM) 2 g in sodium chloride  0.9 % 100 mL IVPB  Status:  Discontinued        2 g 200 mL/hr over 30 Minutes Intravenous Every 8 hours 10/31/19 0742 11/01/19 1032   10/31/19 0200  vancomycin (VANCOREADY) IVPB 1500 mg/300 mL        1,500 mg 150 mL/hr over 120 Minutes Intravenous  Once 10/31/19 0057 10/31/19 0700   10/31/19 0200  piperacillin-tazobactam (ZOSYN) IVPB 3.375 g  Status:  Discontinued        3.375 g 12.5 mL/hr over 240 Minutes Intravenous Every 8 hours 10/31/19 0057 10/31/19 0726   10/24/19 0600  piperacillin-tazobactam (ZOSYN) IVPB 3.375 g  Status:  Discontinued        3.375 g 12.5 mL/hr over 240 Minutes Intravenous Every 8 hours 10/23/19 2305 10/29/19 0932   10/24/19 0000  piperacillin-tazobactam (ZOSYN) IVPB 4.5 g  Status:  Discontinued        4.5 g 200 mL/hr over 30 Minutes Intravenous Every 6  hours 10/23/19 2257 10/23/19 2302   10/23/19 2315  piperacillin-tazobactam (ZOSYN) IVPB 3.375 g        3.375 g 100 mL/hr over 30 Minutes Intravenous STAT 10/23/19 2302 10/23/19 2347       Assessment/Plan 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 Henrene Pastor, per GI, started Solumedrol 7/27; She is going to need Biologics + immunomodulator, probably to start this week  Sigmoid colon perforation-  - 10/24/19:partial colectomy and end colostomy by Dr. Kae Heller. POD#16 - 10/30/19: large mesenteric hematoma noted, hep gtt held - 11/04/19:fascial dehiscence with visible transverse colon: S/P exploratory laparotomy, colostomy revision, fascial closure, placement 4 retention sutures, placement of wound VAC, Dr. Georgette Dover POD#5 - BID wet to dry dressing changes - cont JP drain and monitor output, may be able to remove prior to discharge - tolerating soft diet and ostomy functioning  Sepsis- per primary team, off pressors  Acute hypoxic respiratory failure Protein calorie malnutrition - prealbumibn (8/2) 14.2, off TPN Acute on chronic anemia- s/p 3u pRBC 7/30-8/1, Hgb 9.4, stable Volume overload - ongoing diuresis per primary   FEN: TNA, Ensure, FLD ID: [id number];Ancef 8/6 for bacteremia (1/2 blood Cx 7/31 showed E.Coli) >>8/8 VTE: SCD's, lovenox Foley: out 8/6  Dispo: D/c wound vac and switch to BID wet to dry dressing changes. Tolerating diet and ostomy functioning. From a surgical standpoint I think she will likely be ready for discharge soon. Continue therapies. Last week they were recommending CIR vs SNF, will see how she does today.   LOS: 21 days    Wellington Hampshire, [facility] 11/09/2019, 8:38 AM Please see Amion for pager number during day hours 7:00am-4:30pm\

## 2019-11-09 NOTE — Progress Notes (Signed)
Physical Therapy Treatment Patient Details Name: Natasha Barrera MRN: [id number] DOB: Nov 22, 1965 Today's Date: 11/09/2019    History of Present Illness Shaleen Gilden is a 54 year old female with PMH significant for HTN and diverticulitis (s/p sigmoid colectomy 2015) who originally presented with complaints of LE edema with later reports of abdominal pain and decreased appetite.  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 s/p exp laparatomy for dehiscence, colostomy revision, and wound vac placement 8/5.    PT Comments    Pt very anxious about mobility, especially when sitting EOB preparing to stand. Pt required min-mod +2 for bed mobility and stand with stedy this session, but once standing pt declined further mobility stating "you told me I had to stand and that was it, I want to lay back down". PT unable to convince pt to mobilize OOB to recliner. PT to continue to progress mobility as pt is able to tolerate.    Follow Up Recommendations  CIR;SNF     Equipment Recommendations  Rolling walker with 5" wheels;3in1 (PT)    Recommendations for Other Services       Precautions / Restrictions Precautions Precautions: Fall Precaution Comments: JP drain R flank, colostomy, abdominal binder Restrictions Weight Bearing Restrictions: No    Mobility  Bed Mobility Overal bed mobility: Needs Assistance Bed Mobility: Rolling;Sidelying to Sit;Sit to Supine Rolling: Min guard Sidelying to sit: Min assist;HOB elevated   Sit to supine: Min assist;HOB elevated;+2 for physical assistance   General bed mobility comments: Min guard for rolling, cued by PT for log roll technique. Min +1-2 for to and from sitting EOB for trunk and LE management, boost up in bed with return to  supine.  Transfers Overall transfer level: Needs assistance Equipment used: Ambulation equipment used Transfers: Sit to/from Stand Sit to Stand: Mod assist;+2 physical assistance;+2 safety/equipment;From elevated surface         General transfer comment: Mod +2 for power up, hip extension with posterior facilitation, and steadying. Verbal cuing for rocking for momentum, hand placement on stedy. Stand x1 from EOB, stand x1 from within stedy.  Ambulation/Gait             General Gait Details: unable   Stairs             Wheelchair Mobility    Modified Rankin (Stroke Patients Only)       Balance Overall balance assessment: Needs assistance Sitting-balance support: Bilateral upper extremity supported;Feet supported Sitting balance-Leahy Scale: Fair     Standing balance support: During functional activity;Bilateral upper extremity supported Standing balance-Leahy Scale: Poor Standing balance comment: reliant on external support                            Cognition Arousal/Alertness: Awake/alert Behavior During Therapy: Anxious Overall Cognitive Status: Impaired/Different from baseline Area of Impairment: Following commands;Problem solving;Awareness;Safety/judgement                       Following Commands: Follows one step commands with increased time Safety/Judgement: Decreased awareness of deficits;Decreased awareness of safety Awareness: Intellectual Problem Solving: Requires verbal cues General Comments: Significant anxiety with mobility, stating "I feel like I am going to have a panic attack" sitting EOB. Improved with focus on breathing technique, but  not completely relaxed until return to supine. Pt self-limiting, stating "I can't get to the recliner" even though already up in stedy. Pt states repeatedly throughout session "I don't know why my stomach hurts so bad", pt with extensive abdominal involvement but poor understanding on how  it carries over into function and mobility.      Exercises      General Comments        Pertinent Vitals/Pain Pain Assessment: Faces Faces Pain Scale: Hurts even more Pain Location: abdomen Pain Descriptors / Indicators: Operative site guarding;Sore;Discomfort Pain Intervention(s): Limited activity within patient's tolerance;Monitored during session;Repositioned;Premedicated before session    Home Living                      Prior Function            PT Goals (current goals can now be found in the care plan section) Acute Rehab PT Goals Patient Stated Goal: to have less pain and less swelling in legs  PT Goal Formulation: With patient Time For Goal Achievement: 11/23/19 Potential to Achieve Goals: Good Progress towards PT goals: Progressing toward goals    Frequency    Min 3X/week      PT Plan Current plan remains appropriate    Co-evaluation              AM-PAC PT "6 Clicks" Mobility   Outcome Measure  Help needed turning from your back to your side while in a flat bed without using bedrails?: A Little Help needed moving from lying on your back to sitting on the side of a flat bed without using bedrails?: A Little Help needed moving to and from a bed to a chair (including a wheelchair)?: Total Help needed standing up from a chair using your arms (e.g., wheelchair or bedside chair)?: Total Help needed to walk in hospital room?: Total Help needed climbing 3-5 steps with a railing? : Total 6 Click Score: 10    End of Session Equipment Utilized During Treatment: Oxygen;Gait belt;Other (comment) (abdominal binder) Activity Tolerance: Patient limited by fatigue;Other (comment) (self limiting) Patient left: in bed;with call bell/phone within reach;with bed alarm set;with SCD's reapplied (SCDs not working correctly) Nurse Communication: Mobility status;Other (comment) (anxiety with mobility) PT Visit Diagnosis: Other abnormalities of gait and  mobility (R26.89);Muscle weakness (generalized) (M62.81)     Time: 7544-9201 PT Time Calculation (min) (ACUTE ONLY): 20 min  Charges:  $Therapeutic Activity: 8-22 mins                     Sundee Garland E, PT Acute Rehabilitation Services Pager [phone]  Office [phone]   Bradlee Heitman D Elonda Husky 11/09/2019, 12:33 PM

## 2019-11-09 NOTE — Progress Notes (Signed)
Day RN expressed that she had to remove and redress the patient's entire abdominal wound vac and colostomy on 8/7. Given the close proximity of the midline abdominal wound to the LLQ colostomy, the wound vac was suctioning stool from the colostomy into the sponge of the wound vac as well as into the canister. When this RN went to assess the patient's wound this evening, the same issue was discovered. This RN notified the IMTS provider who came to assess the patient's wound. The patient expressed her desire to wait for the [facility] nurse to change her dressing the following morning, Monday 8/9, given her pain and the complexity of the dressing change. Providers agreed. This RN will encourage the day nurse to urge the [facility] nurse to prioritize this case given the risk of infection. Patient now resting in bed.

## 2019-11-09 NOTE — Progress Notes (Signed)
Rehab Admissions Coordinator Note:  Patient was screened by Cleatrice Burke for appropriateness for an Inpatient Acute Rehab Consult per therapy recs for CIR and SNF.Marland Kitchen  At this time, we are recommending Inpatient Rehab consult. Please place order for consult if you would like patient considered for CIR admit. I will page residents.  Cleatrice Burke RN MSN 11/09/2019, 4:16 PM  I can be reached at [phone].

## 2019-11-09 NOTE — Progress Notes (Signed)
Progress Note  CC:  Crohn's disease        ASSESSMENT AND PLAN:   # Severe Crohn's colitis -- complicated by sigmoid perforation, abscess, infrarenal aortic thrombus and RP hematoma.  She is s/p partial colectomy and end colostomy on 7/24 for perforation. She had dehiscence with visible transverse colon and underwent exp lap, colostomy revision, wound vac on 11/04/19.  --Getting solumedrol . Decreasing dose, went to 20 mg BID on Friday. Will soon make further reduction in dose. She is going to need Biologics + immunomodulator, probably to start this week. Pre-treatment labs including HBV and quantiferon Gold already obtained. HBV negative. Quantiferon Gold indeterminate.( ? Due to being on steroids) TPMT ordered, results pending  --started discussions Friday with patient and daughter about biologics and immuno modulators including purpose of drug, some associated risk factors such as infections / bone marrow suppression, frequency of infusions, expected duration of treatment ( indefinitely) and need for routine office follow up with lab monitoring.   # Macrocytic anemia --Multifactorial from RP bleed / Crohn's disease / critical illness --s/p 6 uPRBC ( last transfusion on 8/1) --Hgb stable at 9.4  # Elevated LFTs -- Alk phos and ALT had normalized at last check on 8/5 and ALT down from 238 to 127.  --Seemed a little early for LFT abnormalities related to TNA.  --Follow up LFTs tomorrow    SUBJECTIVE    Tolerating solids. Patient feels she is making progress.    OBJECTIVE:     Vital signs in last 24 hours: Temp:  [97.8 F (36.6 C)-98.5 F (36.9 C)] 98.2 F (36.8 C) (08/09 0727) Pulse Rate:  [70-95] 76 (08/09 0727) Resp:  [14-20] 20 (08/09 0727) BP: (96-112)/(59-74) 112/74 (08/09 0727) SpO2:  [91 %-97 %] 91 % (08/09 0727) Weight:  [54 kg] 54 kg (08/09 0500) Last BM Date: 11/08/19 General:   Alert, in NAD Heart:  Regular rate and rhythm.  No lower extremity edema     Pulm: Normal respiratory effort   Abdomen:  Soft,   Nondistended, not unexpectedly tender. A few bowel sounds. JP drain with scant dark fluid. No stool in ostomy bag at present. Large surgical dressing covering surgical incision.           Neurologic:  Alert and  oriented,  grossly normal neurologically. Psych:  Pleasant, cooperative.  Normal mood and affect.   Intake/Output from previous day: 08/08 0701 - 08/09 0700 In: 480 [P.O.:480] Out: 1405 [Urine:1100; Drains:105; Stool:200] Intake/Output this shift: Total I/O In: -  Out: 50 [Stool:50]  Lab Results: Recent Labs    11/06/19 1807 11/08/19 0719 11/09/19 0446  WBC 12.4* 9.7 6.4  HGB 9.0* 8.8* 9.4*  HCT 27.7* 27.3* 28.3*  PLT 299 305 322   BMET Recent Labs    11/08/19 0719 11/09/19 0446  NA 131* 131*  K 4.0 3.8  CL 96* 98  CO2 25 26  GLUCOSE 79 84  BUN 16 16  CREATININE <0.30* <0.30*  CALCIUM 7.8* 8.0*   LFT No results for input(s): PROT, ALBUMIN, AST, ALT, ALKPHOS, BILITOT, BILIDIR, IBILI in the last 72 hours. PT/INR No results for input(s): LABPROT, INR in the last 72 hours. Hepatitis Panel No results for input(s): HEPBSAG, HCVAB, HEPAIGM, HEPBIGM in the last 72 hours.  No results found.      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

## 2019-11-10 LAB — BASIC METABOLIC PANEL
Anion gap: 8 (ref 5–15)
BUN: 19 mg/dL (ref 6–20)
CO2: 27 mmol/L (ref 22–32)
Calcium: 8 mg/dL — ABNORMAL LOW (ref 8.9–10.3)
Chloride: 98 mmol/L (ref 98–111)
Creatinine, Ser: <0.3 mg/dL — ABNORMAL LOW (ref 0.44–1.00)
Glucose, Bld: 85 mg/dL (ref 70–99)
Potassium: 3.4 mmol/L — ABNORMAL LOW (ref 3.5–5.1)
Sodium: 133 mmol/L — ABNORMAL LOW (ref 135–145)

## 2019-11-10 LAB — CBC
HCT: 26.9 % — ABNORMAL LOW (ref 36.0–46.0)
Hemoglobin: 8.8 g/dL — ABNORMAL LOW (ref 12.0–15.0)
MCH: 32.2 pg (ref 26.0–34.0)
MCHC: 32.7 g/dL (ref 30.0–36.0)
MCV: 98.5 fL (ref 80.0–100.0)
Platelets: 312 10*3/uL (ref 150–400)
RBC: 2.73 MIL/uL — ABNORMAL LOW (ref 3.87–5.11)
RDW: 18.9 % — ABNORMAL HIGH (ref 11.5–15.5)
WBC: 8 10*3/uL (ref 4.0–10.5)
nRBC: 0 % (ref 0.0–0.2)

## 2019-11-10 LAB — HEPATIC FUNCTION PANEL
ALT: 23 U/L (ref 0–44)
AST: 11 U/L — ABNORMAL LOW (ref 15–41)
Albumin: 1.7 g/dL — ABNORMAL LOW (ref 3.5–5.0)
Alkaline Phosphatase: 112 U/L (ref 38–126)
Bilirubin, Direct: 0.2 mg/dL (ref 0.0–0.2)
Indirect Bilirubin: 0.4 mg/dL (ref 0.3–0.9)
Total Bilirubin: 0.6 mg/dL (ref 0.3–1.2)
Total Protein: 4.6 g/dL — ABNORMAL LOW (ref 6.5–8.1)

## 2019-11-10 MED ORDER — PROSOURCE PLUS PO LIQD
30.0000 mL | Freq: Every day | ORAL | Status: DC
  Administered 2019-11-12 – 2019-11-18 (×7): 30 mL via ORAL
  Filled 2019-11-10 (×14): qty 30

## 2019-11-10 MED ORDER — POTASSIUM CHLORIDE CRYS ER 20 MEQ PO TBCR
40.0000 meq | EXTENDED_RELEASE_TABLET | Freq: Once | ORAL | Status: AC
  Administered 2019-11-10: 40 meq via ORAL
  Filled 2019-11-10: qty 2

## 2019-11-10 MED ORDER — METHYLPREDNISOLONE SODIUM SUCC 40 MG IJ SOLR
15.0000 mg | Freq: Two times a day (BID) | INTRAMUSCULAR | Status: DC
  Administered 2019-11-10 – 2019-11-11 (×2): 15.2 mg via INTRAVENOUS
  Filled 2019-11-10 (×2): qty 1

## 2019-11-10 NOTE — TOC Initial Note (Addendum)
Transition of Care [facility]) - Initial/Assessment Note    Patient Details  Name: Natasha Barrera MRN: [id number] Date of Birth: 1965/05/12  [facility] of [facility]) CM/SW Contact:    Vinie Sill, Horace Phone Number: 11/10/2019, 12:44 PM  Clinical Narrative:                  CSW met with patient at bedside, along with her son ,Lennette Bihari. CSW discussed short term rehab at SNF as back up plan to CIR. Patient declined SNF as an option, patient states if unable to admit to CIR, she will discharge home. She states she has been away from home long enough and she is not going to a nursing home. Patient has received covid vaccines.  Thurmond Butts, MSW, [place] Clinical Social Worker   Expected Discharge Plan: IP Rehab Facility Barriers to Discharge: Insurance Authorization   Patient Goals and CMS Choice        Expected Discharge Plan and Services Expected Discharge Plan: [place] In-house Referral: Clinical Social Work     Living arrangements for the past 2 months: Single Family Home                                      Prior Living Arrangements/Services Living arrangements for the past 2 months: Single Family Home Lives with:: Self, Spouse Patient language and need for interpreter reviewed:: No        Need for Family Participation in Patient Care: Yes (Comment) Care giver support system in place?: Yes (comment)   Criminal Activity/Legal Involvement Pertinent to Current Situation/Hospitalization: No - Comment as needed  Activities of Daily Living Home Assistive Devices/Equipment: None ADL Screening (condition at time of admission) Patient's cognitive ability adequate to safely complete daily activities?: Yes Is the patient deaf or have difficulty hearing?: No Does the patient have difficulty seeing, even when wearing glasses/contacts?: Yes Does the patient have difficulty concentrating, remembering, or making decisions?: No Patient able to express need for  assistance with ADLs?: Yes Does the patient have difficulty dressing or bathing?: Yes Independently performs ADLs?: No Toileting: Needs assistance Is this a change from baseline?: Pre-admission baseline Does the patient have difficulty walking or climbing stairs?: No Weakness of Legs: Both Weakness of Arms/Hands: None  Permission Sought/Granted Permission sought to share information with : Family Supports Permission granted to share information with : Yes, Verbal Permission Granted  Share Information with NAME: [place] granted to share info w Relationship: spouse  Permission granted to share info w Contact Information: [phone]  Emotional Assessment Appearance:: Appears stated age Attitude/Demeanor/Rapport: Engaged Affect (typically observed): Pleasant, Accepting Orientation: : Oriented to Self, Oriented to Place, Oriented to  Time, Oriented to Situation Alcohol / Substance Use: Not Applicable Psych Involvement: No (comment)  Admission diagnosis:  Hyponatremia [E87.1] Peripheral edema [R60.9] Thrombocytosis (HCC) [D47.3] Weight loss, unintentional [R63.4] Anemia, unspecified type [D64.9] Patient Active Problem List   Diagnosis Date Noted  . E coli bacteremia 11/04/2019  . Colon perforation ([place]) 10/31/2019  . Peritoneal hematoma 10/31/2019  . 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 Acute blood loss anemia 10/31/2019  . Pressure injury of skin 10/29/2019  . Malnutrition of moderate degree ([place])   . Crohn's disease of large intestine with other complication ([place]) 10/62/6948  . Diarrhea   . Loss of appetite 10/02/2019  . Abdominal bloating 10/02/2019  . Tachycardia 10/02/2019  .  Loss of weight 10/02/2019  . Hypokalemia 09/19/2019   PCP:  Lacinda Axon, MD Pharmacy:   [id number] [place], [place] - [address] [place] [id number]: [phone]:  [phone]    Social Determinants of Health (SDOH) Interventions    Readmission Risk Interventions No flowsheet data found.

## 2019-11-10 NOTE — Progress Notes (Signed)
Chart reviewed. Patient not seen in person today.  I spoke with Dr. Teresa Pelton with [facility] by phone to discuss overall case and to ensure she agrees with current medical treatment for Mrs. Uhlig's Crohn's colitis. Dr. Cephas Darby focuses on the care of IBD patient's at [facility].  After this discussion Dr. Cephas Darby is in agreement that we should avoid adding biologic therapy over fear of complication due to additional immunosuppression such as infection. We both agree that steroids will be the primary therapy for her Crohn's disease as well as the tincture of time to allow healing after now 2 abdominal surgeries and a wound which has to heal by secondary intention.  Will try and reduce steroids to 30 mg every 24 hours and then if tolerated to 20 mg daily in 3-5 days time.  Can likely soon convert IV steroids to prednisone.    She very well may eventually need biologic therapy but this will be after a number of weeks past to allow time for wound healing, better nutrition and improve strength and conditioning.  We will see the patient again tomorrow

## 2019-11-10 NOTE — Plan of Care (Signed)

## 2019-11-10 NOTE — Progress Notes (Signed)
IP rehab admissions - I met with patient at the bedside.  I gave her rehab brochures and went over inpatient rehab program.  Patient is interested in [place] admission prior to DC home.  I will open the case and request acute inpatient rehab admission.  I will update all once I have heard back from insurance case manager.  Call me for questions.  [phone]

## 2019-11-10 NOTE — PMR Pre-admission (Signed)
PMR Admission Coordinator Pre-Admission Assessment  Patient: Natasha Barrera is an 54 y.o., female MRN: [id number] DOB: 06-27-1965 Height: 5' 1"  (154.9 cm) Weight: 50.6 kg  Insurance Information HMO:      PPO: Yes     PCP:       IPA:       80/20:       OTHER: Open Access PRIMARY: Aet[employer] NAP      Policy#: [id number]      Subscriber: [place] Name:       Phone#:      Fax#: [phone] Pre-Cert#: [id number]      Employer: Husband works FT Benefits:  Phone #: [phone]     Name: Online at [email or web address] provider portal Eff. Date: 04/03/19     Deduct: $5000 (met $5000)      Out of Pocket Max: [phone] (met $8122.20)      Life Max: N/A CIR: 70% with auth      SNF: 70% with 60 day limit Outpatient: 70% limited to 60 combined visits of Pt/OT/SLP     Co-Pay: 30% Home Health: 70% with 60 visit limit      Co-Pay: 30% DME: 70%     Co-Pay: 30% Providers: in network  SECONDARY:   None      Policy#:       Phone#:    Development worker, community:        Phone#:    The Engineer, petroleum" for patients in Inpatient Rehabilitation Facilities with attached "Privacy Act [place] Records" was provided and verbally reviewed with: N/A  Emergency Contact Information Contact Information    Name Relation Home Work Mobile   Natasha Barrera Spouse   [phone]   Alfredia, Desanctis Daughter   [phone]      Current Medical History  Patient Admitting Diagnosis:Colon perforation  History of Present Illness: Natasha Barrera is a 54 year old right-handed female with history of hypertension, diverticulitis status post sigmoid colectomy 2013, ventral hernia repair, tobacco use.  Patient with recent admission 10/02/2019 to 10/04/2019 for complaints of abdominal discomfort diarrhea and weight loss found to be hypotensive with electrolyte derangements.  CT abdomen pelvis at that time revealed diffuse colitis involving the ascending, transverse and descending colon.  GI was consulted  recommendations inpatient colostomy and stool studies.  Patient and her husband refused to stay for complete work-up and left AMA.  Since her discharge of 10/04/2019 continued experiencing worsening fatigue poor appetite loose stools as well as some swelling lower extremities.  Patient presented back 10/18/2019 with increasing abdominal pain.  Blood pressure 97/65 hemoglobin 10.4 platelet 593 sodium 123 chloride 86 potassium 3.5 total protein 4.4, total bilirubin 1.3.  CTA unremarkable.  GI services consulted Dr. Henrene Pastor underwent colonoscopy/upper GI endoscopy 10/21/2019 that showed the terminal ileum appeared normal.  There was scarring and mucosal bridging as well as scattered areas of ulceration throughout the entire colon.  There was evidence of prior sigmoid colectomy and a few diverticula.  EGD was unremarkable.  Findings favored Crohn's colitis.  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 Kae Heller.  Postoperative course complicated by hypotension with follow-up for critical care  maintained on IV hydration as well as Levophed.  She did require IV heparin for thrombosis and she did develop subsequent large mesenteric hematoma 10/30/2019 and IV heparin discontinued.  On 11/04/2019 she had facial dehiscence with visible transverse colon underwent exploratory laparotomy colostomy revision, fascial closure, placement of 4 retention sutures and wound VAC along with right JP drain placement.  As of 11/11/2019 findings of right abdominal abscess status post IR drain per Dr.Yamagata As noted cultures  Klebsiella and Pseudomonas and initially maintained on Zosyn switched to Cipro 11/18/2019.  Patient undergoing twice daily wet-to-dry dressing changes.  She was cleared to begin subcutaneous Lovenox for DVT prophylaxis.  Patient with slow progressive gains acute blood loss anemia latest hemoglobin 8.4, sodium 135. Pt. To be discharged home with JP drains in place, per MD.   Patient's medical record from [facility] has been reviewed by the rehabilitation admission coordinator and physician.  Past Medical History  Past Medical History:  Diagnosis Date  . Diverticul disease small and large intestine, no perforati or abscess   . Diverticulitis of colon   . Hypertension     Family History   family history is not on file.  Prior Rehab/Hospitalizations Has the patient had prior rehab or hospitalizations prior to admission? No  Has the patient had major surgery during 100 days prior to admission? Yes   Current Medications  Current Facility-Administered Medications:  .  (feeding supplement) PROSource Plus liquid 30 mL, 30 mL, Oral, Daily, Axel Filler, MD, 30 mL at 11/18/19 0830 .  0.9 %  sodium chloride infusion, , Intravenous, PRN, Donnie Mesa, MD, Last Rate: 0 mL/hr at 11/21/19 0701, Rate Verify at 11/21/19 0701 .  acetaminophen (TYLENOL) tablet 650 mg, 650 mg, Oral, Q6H, Simaan, Darci Current, PA-C, 650 mg at 11/23/19 0440 .  Chlorhexidine Gluconate Cloth 2 % PADS 6 each, 6 each, Topical, Daily, Donnie Mesa, MD, 6 each at 11/22/19 1522 .  ciprofloxacin (CIPRO) tablet 500 mg, 500 mg, Oral, BID, Meuth, Brooke A, PA-C, 500 mg at 11/23/19 0950 .  enoxaparin (LOVENOX) injection 40 mg, 40 mg, Subcutaneous, Q24H, Allred, Darrell K, PA-C, 40 mg at 11/21/19 1600 .  escitalopram (LEXAPRO) tablet 10 mg, 10 mg, Oral, Daily, Pashayan, Redgie Grayer, MD, 10 mg at 11/23/19 0950 .  feeding supplement (ENSURE ENLIVE) (ENSURE ENLIVE) liquid 237 mL, 237 mL, Oral, QID, Romana Juniper A, MD,  237 mL at 11/23/19 0950 .  gabapentin (NEURONTIN) capsule 200 mg, 200 mg, Oral, BID, Meuth, Brooke A, PA-C, 200 mg at 11/23/19 0949 .  guaiFENesin (MUCINEX) 12 hr tablet 600 mg, 600 mg, Oral, BID PRN, Meuth, Brooke A, PA-C, 600 mg at 11/08/19 1010 .  HYDROmorphone (DILAUDID) injection 0.5-1 mg, 0.5-1 mg, Intravenous, Q4H PRN, Meuth, Brooke A, PA-C, 0.5 mg at 11/10/19 2023 .  lidocaine (LIDODERM) 5 % 1 patch, 1 patch, Transdermal, Q24H, Simaan, Darci Current, PA-C, 1 patch at 11/20/19 1219 .  melatonin tablet 3 mg, 3 mg, Oral, QHS PRN, Meuth, Brooke A, PA-C, 3 mg at 11/19/19 2146 .  methocarbamol (ROBAXIN) tablet 1,000 mg, 1,000 mg, Oral, Q8H, Meuth, Brooke A, PA-C, 1,000 mg at 11/23/19 0440 .  multivitamin with minerals tablet 1 tablet, 1 tablet, Oral, Daily, Clovis Riley, MD, 1 tablet at 11/23/19 [phone] .  ondansetron (ZOFRAN) injection 4 mg, 4 mg, Intravenous, Q6H PRN, Donnie Mesa, MD, 4 mg at 11/05/19 0008 .  pantoprazole (PROTONIX) EC tablet 40 mg, 40 mg, Oral, Q0600, Donnie Mesa, MD, 40 mg  at 11/23/19 0440 .  potassium chloride SA (KLOR-CON) CR tablet 40 mEq, 40 mEq, Oral, Daily, Mosetta Anis, MD, 40 mEq at 11/23/19 0949 .  predniSONE (DELTASONE) tablet 20 mg, 20 mg, Oral, QAC breakfast, Vena Rua, PA-C, 20 mg at 11/23/19 0949 .  sodium chloride flush (NS) 0.9 % injection 10-40 mL, 10-40 mL, Intracatheter, Q12H, Donnie Mesa, MD, 10 mL at 11/22/19 2202 .  sodium chloride flush (NS) 0.9 % injection 10-40 mL, 10-40 mL, Intracatheter, PRN, Donnie Mesa, MD .  sodium chloride flush (NS) 0.9 % injection 3 mL, 3 mL, Intravenous, Once, Donnie Mesa, MD .  sodium chloride flush (NS) 0.9 % injection 5 mL, 5 mL, Intracatheter, Q8H, Aletta Edouard, MD, 5 mL at 11/23/19 0440 .  traMADol (ULTRAM) tablet 50-100 mg, 50-100 mg, Oral, Q6H PRN, Jill Alexanders, PA-C, 100 mg at 11/16/19 1104 .  vitamin A capsule 10,000 Units, 10,000 Units, Oral, Daily, Donnie Mesa, MD, 10,000 Units at  11/23/19 4034  Patients Current Diet:  Diet Order            DIET SOFT Room service appropriate? Yes; Fluid consistency: Thin  Diet effective now                 Precautions / Restrictions Precautions Precautions: Fall, Other (comment) Precaution Comments: Jp drain x2 Right side/ ostomy L side / abdomen binder when up Other Brace: abdominal binder when OOB Restrictions Weight Bearing Restrictions: No   Has the patient had 2 or more falls or a fall with injury in the past year? No  Prior Activity Level Limited Community (1-2x/wk): Went out 3-4 X a week, was driving.  Prior Functional Level Self Care: Did the patient need help bathing, dressing, using the toilet or eating? Independent  Indoor Mobility: Did the patient need assistance with walking from room to room (with or without device)? Independent  Stairs: Did the patient need assistance with internal or external stairs (with or without device)? Independent  Functional Cognition: Did the patient need help planning regular tasks such as shopping or remembering to take medications? Independent  Home Assistive Devices / Equipment Home Assistive Devices/Equipment: None Home Equipment: None  Prior Device Use: Indicate devices/aids used by the patient prior to current illness, exacerbation or injury? None  Current Functional Level Cognition  Overall Cognitive Status: Impaired/Different from baseline Orientation Level: Oriented X4 Following Commands: Follows one step commands with increased time Safety/Judgement: Decreased awareness of deficits, Decreased awareness of safety General Comments: pt with poor recall of HEP for UB provided previous session. pt provided medibridge access code    Extremity Assessment (includes Sensation/Coordination)  Upper Extremity Assessment: Generalized weakness RUE Deficits / Details: 4 / 5 MMT  FULL AROM LUE Deficits / Details: 4 / 5 MMT  FULL AROM  Lower Extremity Assessment: Defer to  PT evaluation, RLE deficits/detail, LLE deficits/detail RLE Deficits / Details: reports not feeling feet in standing LLE Deficits / Details: reports not feeling feet in standing    ADLs  Overall ADL's : Needs assistance/impaired Eating/Feeding: Modified independent, Sitting Eating/Feeding Details (indicate cue type and reason): drinking ensure at the end of session Grooming: Wash/dry hands, Wash/dry face, Set up, Sitting Grooming Details (indicate cue type and reason): setup/S at EOB to wash face, max A to comb/brush hair (tangles and fatigued easily) in recliner Upper Body Bathing: Set up, Sitting Upper Body Bathing Details (indicate cue type and reason): pt requires pause between sides due to decr endurance Lower Body Bathing: Moderate  assistance, Sit to/from stand Lower Body Bathing Details (indicate cue type and reason): pt able to use bil UE to bend LE into knee flexion to wash leg but requires one UE to hold the leg due to weakness. Pt able to complete anterior peri care supine. Pt sit<>Stand in stedy x2 for posterior peri care and barrier cream application. pt sustained 1 minute static stand. pt able to reach with L UE to pull on mesh underwear Upper Body Dressing : Minimal assistance, Sitting Upper Body Dressing Details (indicate cue type and reason): due to drains and abdomen binder requires increased (A) at the moment Lower Body Dressing: Moderate assistance, Bed level Lower Body Dressing Details (indicate cue type and reason): pt using bil UE to pull leg up and OT to (A) with figure 4. pt able to loop sock over toes and pull on socks Toilet Transfer: Maximal assistance Toilet Transfer Details (indicate cue type and reason): simulated sit<>Stand from bed with stedy Toileting- Clothing Manipulation and Hygiene: Total assistance Toileting - Clothing Manipulation Details (indicate cue type and reason): colostomy and purewick  Functional mobility during ADLs: Moderate assistance, Cueing  for safety, Cueing for sequencing General ADL Comments: pt completed transfer from supine to sit then sit<>Stand x4 in stedy this session    Mobility  Overal bed mobility: Needs Assistance Bed Mobility: Supine to Sit Rolling: Supervision Sidelying to sit: Min assist, HOB elevated Supine to sit: Min assist, HOB elevated Sit to supine: Min guard Sit to sidelying: Mod assist, +2 for safety/equipment General bed mobility comments: oob in chair on arrival    Transfers  Overall transfer level: Needs assistance Equipment used: Rolling walker (2 wheeled), 1 person hand held assist Transfer via [place]: Stedy Transfers: Sit to/from Stand Sit to Stand: Mod assist Stand pivot transfers: Min assist Squat pivot transfers: Max assist  Lateral/Scoot Transfers: Mod assist General transfer comment: pt requires therapist to help anterior lift patient into the stedy. pt pulling up and initiating the task    Ambulation / Gait / Stairs / Wheelchair Mobility  Ambulation/Gait Ambulation/Gait assistance: Herbalist (Feet): 3 Feet Assistive device: Rolling walker (2 wheeled) Gait Pattern/deviations: Step-to pattern General Gait Details: unable    Posture / Balance Dynamic Sitting Balance Sitting balance - Comments: requires use of bil UE Balance Overall balance assessment: Needs assistance Sitting-balance support: No upper extremity supported, Feet supported Sitting balance-Leahy Scale: Fair Sitting balance - Comments: requires use of bil UE Standing balance support: Bilateral upper extremity supported Standing balance-Leahy Scale: Poor Standing balance comment: modA to maintain static standing, max with any weight shift    Special needs/care consideration Wound Vac placed 8/5 Skin: surgical incision to the abdomen, Ecchymosis of the BUEs, Skin tear to the sacrum and  buttocks  Special service needs: Pt. With ostomy bag Designated visitor Brissia Delisa (daughter)    Previous Home Environment (from acute therapy documentation) Living Arrangements: Spouse/significant other, Children Available Help at Discharge: Family Type of Home: House Home Layout: One level Home Access: Stairs to enter [employer] of Steps: couple Bathroom Shower/Tub: Multimedia programmer: [place]: No Additional Comments: Pt reports spouse works from home   Discharge [place] for Discharge Living Setting: Patient's home, [place], Lives with (comment) (Lives with husband, Mikki Santee.) Type of Home at Discharge: House Discharge Home Layout: One level Discharge Home Access: Stairs to enter Entrance Stairs-Number of Steps: 3 steps up to deck Discharge Bathroom Shower/Tub: Gaffer, [place] Discharge Bathroom Toilet: Standard  Discharge Bathroom Accessibility: Yes How Accessible: Accessible via walker Does the patient have any problems obtaining your medications?: No  Social/Family/Support Systems Patient Roles: Spouse, Parent (Has a husband and 3 adult children.) Contact Information: Alycea Segoviano - husband Anticipated Caregiver: husband Anticipated Caregiver's Contact Information: Mikki Santee - husband - [phone] Ability/Limitations of Caregiver: Husband works from home and can assist. Caregiver Availability: 24/7 Discharge Plan Discussed with Primary Caregiver: Yes Is Caregiver In Agreement with Plan?: Yes Does Caregiver/Family have Issues with Lodging/Transportation while Pt is in Rehab?: No  Goals Patient/Family Goal for Rehab: PT/OT mod I and supervision goals Expected length of stay: 7-10 days Cultural Considerations: None Pt/Family Agrees to Admission and willing to participate: Yes Program Orientation Provided & Reviewed with Pt/Caregiver Including Roles  & Responsibilities: Yes  Decrease burden of Care through IP rehab admission: N/A  Possible need for SNF placement upon discharge: Not anticipated  Patient  Condition: I have reviewed medical records from [facility], spoken with [place], and patient and family member. I met with patient at the bedside for inpatient rehabilitation assessment.  Patient will benefit from ongoing PT and OT, can actively participate in 3 hours of therapy a day 5 days of the week, and can make measurable gains during the admission.  Patient will also benefit from the coordinated team approach during an Inpatient Acute Rehabilitation admission.  The patient will receive intensive therapy as well as Rehabilitation physician, nursing, social worker, and care management interventions.  Due to safety, skin/wound care, disease management, medication administration, pain management and patient education the patient requires 24 hour a day rehabilitation nursing.  The patient is currently  Mod A with mobility and basic ADLs.  Discharge setting and therapy post discharge at home with home health is anticipated.  Patient has agreed to participate in the Acute Inpatient Rehabilitation Program and will admit tomorrow.  Preadmission Screen Completed By:  Genella Mech, 11/23/2019 10:07 AM ______________________________________________________________________   Discussed status with Dr. Ranell Patrick on 11/23/19 at 25 and received approval for admission tomorrow.  Admission Coordinator:  Karene Fry, RN Updates provided by Clemens Catholic, MS CCC-SLP, time 1000 /Date 11/23/2019  Assessment/Plan: Diagnosis: Debility  1. Does the need for close, 24 hr/day Medical supervision in concert with the patient's rehab needs make it unreasonable for this patient to be served in a less intensive setting? Yes 2. Co-Morbidities requiring supervision/potential complications: colon perforation, Chron's disease of large colon without complication, malnutrition of moderate degree, pressure injury of th skin, peritoneal hematoma, renal infarction, acute blood loss anemia, E. Coli bacteremia,  hypokalemia 3. Due to bladder management, bowel management, safety, skin/wound care, disease management, medication administration, pain management and patient education, does the patient require 24 hr/day rehab nursing? Yes 4. Does the patient require coordinated care of a physician, rehab nurse, PT, OT, and SLP to address physical and functional deficits in the context of the above medical diagnosis(es)? No Addressing deficits in the following areas: balance, endurance, locomotion, strength, transferring, bowel/bladder control, bathing, dressing, feeding, grooming, toileting, cognition and psychosocial support 5. Can the patient actively participate in an intensive therapy program of at least 3 hrs of therapy 5 days a week? Yes 6. The potential for patient to make measurable gains while on inpatient rehab is excellent 7. Anticipated functional outcomes upon discharge from inpatient rehab: min assist PT, min assist OT, independent SLP 8. Estimated rehab length of stay to reach the above functional goals is: 12-16 days 9. Anticipated discharge destination: Home 10. Overall Rehab/Functional  Prognosis: good   MD Signature: Leeroy Cha, MD

## 2019-11-10 NOTE — Progress Notes (Addendum)
Subjective: Interviewed patient at bedside.  She reports feeling ok today. She had her son at bedside and was happier because of it.  Objective:  Vital signs in last 24 hours: Vitals:   11/09/19 2317 11/10/19 0400 11/10/19 0454 11/10/19 0733  BP: 102/60 (!) 89/52  98/60  Pulse: 84 68  80  Resp: 16 16  18   Temp: 98.3 F (36.8 C) 97.8 F (36.6 C)  97.7 F (36.5 C)  TempSrc: Oral Oral  Oral  SpO2: 100% 98%  90%  Weight:   53 kg   Height:       Physical Exam Vitals and nursing note reviewed.  Constitutional:      General: She is not in acute distress.    Appearance: She is not ill-appearing or toxic-appearing.  HENT:     Head: Normocephalic and atraumatic.  Eyes:     Extraocular Movements: Extraocular movements intact.  Cardiovascular:     Rate and Rhythm: Normal rate and regular rhythm.     Pulses: Normal pulses.          Radial pulses are 2+ on the right side and 2+ on the left side.       Dorsalis pedis pulses are 2+ on the right side and 2+ on the left side.     Heart sounds: Normal heart sounds, S1 normal and S2 normal. No murmur heard.   Pulmonary:     Effort: Pulmonary effort is normal.     Breath sounds: No rhonchi or rales.     Comments: Has occasional mucus blocking and coughing Abdominal:     General: There is no distension.     Palpations: Abdomen is soft. There is no mass.     Tenderness: There is abdominal tenderness.  Musculoskeletal:     Right lower leg: 1+ Edema present.     Left lower leg: 1+ Edema present.  Neurological:     Mental Status: She is alert. Mental status is at baseline.      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     Adjustment Disorder with Depressed Mood: Discussed thoughts about starting a medication yesterday and patient was agreeable. After careful selection Escitalopram has been started. Will monitor response. -First dose given today of Escitalopram 10 mg  -Will monitor for Serotonin Syndrome due to concurrent Tramadol use     Acute Hypoxia: Was on 2 L nasal canula and satting around 91-95%. Has continued chest physiotherapy for mucous plugging. Coughing up less mucus. -Continue chest physiotherapy q4 while awake -Continue Lasix IV 40 mg BID.     Crohn's Flare/Sigmoid Colon perforation: S/P colectomy on 7/24. S/P exploratory Lap early morning 8/5. Ostomy bag has a good amount of lite brown liquid in it today.  -Continue advancing diet as able -Continue dressing changes on wound  -Per GI continue solumedrol 32m IV q12 hours -Continue Protonix 40 mg daily -Per surgery continue IV methocarbamol 1 g q8     Right peritoneal hematoma/Hemorrhagic  Shock: Hemoglobin today is 8.8.  Her pressures have remained normotensive but have been softer today. -Continue to trend CBC -Low threshold for repeat CT due to previous hematoma with restarting Heparin     E. Coli Bacteremia (resolved): Remains afebrile and WBC is 8.0 today.  -Continue to trend WBC     Acute Delirium (resolved): Has not had any further episodes. -Continue to work with PT as able/comfortable  -Only short naps during the day     Renal Infarcts/Abdominal aortic thrombus: Renal function appears adequate. Urine output yesterday was 700cc. -Continue Lasix IV 40 mg BID -Continue Lovenox 40 mg subcutaneous -Continue monitoring renal output     Right Hydronephrosis (resolved):      Severe protein calorie  malnutrition Albumin was 1.7 today. Currently on full liquids, will continue to advance diet per surgery and as tolerated. Reports drinking some liquids and ensure, encouraged to drink as much as able. Patient reports she has been drinking smoothies which she has been enthusiastic about. -Continue drinking liquids -Continue advancing diet as able     DVT prophx: SCDs and lovenox subcutaneous Diet: full liquids IVF: none   Prior to Admission Living Arrangement: Home Anticipated Discharge Location: CIR vs SNF Barriers to Discharge: Complex Medical Issues Dispo: Anticipated discharge in approximately 4-6 day(s).   Briant Cedar, MD 11/10/2019, 11:17 AM Pager: [phone] After 5pm on weekdays and 1pm on weekends: On Call pager [phone]

## 2019-11-10 NOTE — Progress Notes (Signed)
[facility] Progress Note  6 Days Post-Op  Subjective: CC-  Abdominal pain well controlled. Mild nausea this AM, no emesis. Ostomy functioning. Did not eat much for dinner last night. Drank Ensure x2 yesterday. Still requiring some supplemental oxygen, more at night than during the day. Working on IS. Looking into CIR.  Objective: Vital signs in last 24 hours: Temp:  [97.7 F (36.5 C)-99 F (37.2 C)] 97.7 F (36.5 C) (08/10 0733) Pulse Rate:  [68-106] 80 (08/10 0733) Resp:  [16-18] 18 (08/10 0733) BP: (88-102)/(52-68) 98/60 (08/10 0733) SpO2:  [90 %-100 %] 90 % (08/10 0733) Weight:  [53 kg] 53 kg (08/10 0454) Last BM Date: 11/09/19  Intake/Output from previous day: 08/09 0701 - 08/10 0700 In: 720 [P.O.:720] Out: 800 [Urine:700; Stool:100] Intake/Output this shift: No intake/output data recorded.  PE: Gen: Alert, NAD, pleasant Card: RRR Pulm: rate and effort normal on supplemental oxygen via [place] Abd: Soft,ND, nontender,drain with scant dark SS fluid, ostomy viable with gas/liquid stool in pouch, I did not take dressing down today to look at midline/retention sutures  Lab Results:  Recent Labs    11/09/19 0446 11/10/19 0415  WBC 6.4 8.0  HGB 9.4* 8.8*  HCT 28.3* 26.9*  PLT 322 312   BMET Recent Labs    11/09/19 0446 11/10/19 0415  NA 131* 133*  K 3.8 3.4*  CL 98 98  CO2 26 27  GLUCOSE 84 85  BUN 16 19  CREATININE <0.30* <0.30*  CALCIUM 8.0* 8.0*   PT/INR No results for input(s): LABPROT, INR in the last 72 hours. CMP     Component Value Date/Time   NA 133 (L) 11/10/2019 0415   K 3.4 (L) 11/10/2019 0415   CL 98 11/10/2019 0415   CO2 27 11/10/2019 0415   GLUCOSE 85 11/10/2019 0415   BUN 19 11/10/2019 0415   CREATININE <0.30 (L) 11/10/2019 0415   CALCIUM 8.0 (L) 11/10/2019 0415   PROT 4.6 (L) 11/10/2019 0415   ALBUMIN 1.7 (L) 11/10/2019 0415   AST 11 (L) 11/10/2019 0415   ALT 23 11/10/2019 0415   ALKPHOS 112 11/10/2019 0415    BILITOT 0.6 11/10/2019 0415   GFRNONAA NOT CALCULATED 11/10/2019 0415   GFRAA NOT CALCULATED 11/10/2019 0415   Lipase     Component Value Date/Time   LIPASE 18 09/19/2019 1631       Studies/Results: No results found.  Anti-infectives: Anti-infectives (From admission, onward)   Start     Dose/Rate Route Frequency Ordered Stop   11/06/19 0600  ceFAZolin (ANCEF) IVPB 2g/100 mL premix  Status:  Discontinued        2 g 200 mL/hr over 30 Minutes Intravenous Every 8 hours 11/05/19 0914 11/08/19 1133   11/04/19 0600  cefTRIAXone (ROCEPHIN) 2 g in sodium chloride 0.9 % 100 mL IVPB  Status:  Discontinued        2 g 200 mL/hr over 30 Minutes Intravenous Every 24 hours 11/04/19 0500 11/05/19 0914   10/31/19 1700  vancomycin (VANCOCIN) IVPB 1000 mg/200 mL premix  Status:  Discontinued        1,000 mg 200 mL/hr over 60 Minutes Intravenous Every 12 hours 10/31/19 0742 11/01/19 1032   10/31/19 1000  vancomycin (VANCOREADY) IVPB 750 mg/150 mL  Status:  Discontinued        750 mg 150 mL/hr over 60 Minutes Intravenous Every 12 hours 10/31/19 0057 10/31/19 0742   10/31/19 0900  meropenem (MERREM) 2 g in sodium chloride 0.9 %  100 mL IVPB  Status:  Discontinued        2 g 200 mL/hr over 30 Minutes Intravenous Every 8 hours 10/31/19 0742 11/01/19 1032   10/31/19 0200  vancomycin (VANCOREADY) IVPB 1500 mg/300 mL        1,500 mg 150 mL/hr over 120 Minutes Intravenous  Once 10/31/19 0057 10/31/19 0700   10/31/19 0200  piperacillin-tazobactam (ZOSYN) IVPB 3.375 g  Status:  Discontinued        3.375 g 12.5 mL/hr over 240 Minutes Intravenous Every 8 hours 10/31/19 0057 10/31/19 0726   10/24/19 0600  piperacillin-tazobactam (ZOSYN) IVPB 3.375 g  Status:  Discontinued        3.375 g 12.5 mL/hr over 240 Minutes Intravenous Every 8 hours 10/23/19 2305 10/29/19 0932   10/24/19 0000  piperacillin-tazobactam (ZOSYN) IVPB 4.5 g  Status:  Discontinued        4.5 g 200 mL/hr over 30 Minutes Intravenous Every 6  hours 10/23/19 2257 10/23/19 2302   10/23/19 2315  piperacillin-tazobactam (ZOSYN) IVPB 3.375 g        3.375 g 100 mL/hr over 30 Minutes Intravenous STAT 10/23/19 2302 10/23/19 2347       Assessment/Plan 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 Henrene Pastor, per GI, started Solumedrol 7/27;per note from 8/9 likely need 4 to 6 weeks of wound healing before we consider biologic therapy  Sigmoid colon perforation-  - 10/24/19:partial colectomy and end colostomy by Dr. Kae Heller. POD#17 - 10/30/19: large mesenteric hematoma noted, hep gtt held - 11/04/19:fascial dehiscence with visible transverse colon: S/P exploratory laparotomy, colostomy revision, fascial closure, placement 4 retention sutures, placement of wound VAC, Dr. Annamarie Dawley - BID wet to dry dressing changes - JP drain with no output - will discuss removal with MD - tolerating soft diet and ostomy functioning  Sepsis- per primary team, off pressors  Acute hypoxic respiratory failure Protein calorie malnutrition - prealbumibn (8/2) 14.2, off TPN Acute on chronic anemia- s/p 3u pRBC 7/30-8/1,Hgb 8.8, stable Volume overload - ongoing diuresis per primary   FEN: Ensure, soft diet ID: [id number];Ancef 8/6 for bacteremia (1/2 blood Cx 7/31 showed E.Coli)>>8/8 VTE: SCD's, lovenox Foley: out 8/6  Dispo:Stable for discharge to CIR from surgical standpoint. Continue BID wet to dry dressing changes. Encourage PO intake. Continue therapies. I will discuss JP drain removal with MD.   LOS: 22 days    Natasha Barrera, [facility] 11/10/2019, 9:03 AM Please see Amion for pager number during day hours 7:00am-4:30pm

## 2019-11-10 NOTE — Progress Notes (Signed)
Nutrition Follow-up  DOCUMENTATION CODES:   Non-severe (moderate) malnutrition in context of chronic illness  INTERVENTION:  Continue Ensure Enlive po QID, each supplement provides 350 kcal and 20 grams of protein  Provide 30 ml Prosource po once daily, each supplement provides 100 kcal and 15 grams of protein.   Encourage adequate PO intake.   NUTRITION DIAGNOSIS:   Moderate Malnutrition related to chronic illness (crohn's disease) as evidenced by moderate muscle depletion, moderate fat depletion; ongoing  GOAL:   Patient will meet greater than or equal to 90% of their needs; progressing  MONITOR:   PO intake, Supplement acceptance, Skin, Weight trends, Labs, I & O's  REASON FOR ASSESSMENT:   Malnutrition Screening Tool, Consult Assessment of nutrition requirement/status  ASSESSMENT:   [name] is a 54 year old female with past medical history of HTN, diverticulitis s/p sigmoid colectomy in 2013, ventral hernia (repaired), and prior episode of colitis, and a recent hospitalization for diarrhea, hypotension and electrolyte derangements secondary to colitis who returned nine days after leaving AMA found to have continued electrolyte derangements with new onset peripheral edema.  Pt with Inflammatory Colitis secondary to Crohn's Disease  7/24 s/p ex lap, LOA, segmental colectomy with end colostomy due to large perforation in the prior rectosigmoid anastomosis 7/25 TPN started due to anticipated prolonged ileus post op 7/31 pt found to have large mesenteric hematoma (11.3 cm) within R retroperitoneum with innumerable bil renal infarcts - no intervention at this time; pt NPO 8/4 Exploratory laparotomy for dehiscence, colostomy revision, placement of wound VAC  8/7 Diet advanced to soft diet, TPN weaned and discontinued  Pt continues on a soft diet. Meal completion has been 10-25%. Pt currently has Ensure ordered and has been consuming them. Family has been  encouraging pt PO intake. RD to additionally order Prosource plus protein modular to aid in increased protein needs. Per MD, plans to discontinue JP drain today.   Labs and medications reviewed.   Diet Order:   Diet Order            DIET SOFT Room service appropriate? Yes; Fluid consistency: Thin  Diet effective now                 EDUCATION NEEDS:   No education needs have been identified at this time  Skin:  Skin Assessment: Skin Integrity Issues: Skin Integrity Issues:: Stage I Stage I: R buttocks Wound Vac: N/A Incisions: abdomen  Last BM:  8/9 colostomy 50 ml output  Height:   Ht Readings from Last 1 Encounters:  11/04/19 5' 1"  (1.549 m)    Weight:   Wt Readings from Last 1 Encounters:  11/10/19 53 kg   BMI:  Body mass index is 22.08 kg/m.  Estimated Nutritional Needs:   Kcal:  1700-1900  Protein:  90-105 grams  Fluid:  > 1.7 L  Corrin Parker, MS, RD, LDN RD pager number/after hours weekend pager number on Amion.

## 2019-11-11 ENCOUNTER — Inpatient Hospital Stay (HOSPITAL_COMMUNITY): Payer: 59

## 2019-11-11 DIAGNOSIS — E440 Moderate protein-calorie malnutrition: Secondary | ICD-10-CM | POA: Diagnosis not present

## 2019-11-11 DIAGNOSIS — K50118 Crohn's disease of large intestine with other complication: Secondary | ICD-10-CM | POA: Diagnosis not present

## 2019-11-11 DIAGNOSIS — K651 Peritoneal abscess: Secondary | ICD-10-CM | POA: Diagnosis not present

## 2019-11-11 LAB — CBC
HCT: 27.9 % — ABNORMAL LOW (ref 36.0–46.0)
Hemoglobin: 8.8 g/dL — ABNORMAL LOW (ref 12.0–15.0)
MCH: 31.3 pg (ref 26.0–34.0)
MCHC: 31.5 g/dL (ref 30.0–36.0)
MCV: 99.3 fL (ref 80.0–100.0)
Platelets: 303 10*3/uL (ref 150–400)
RBC: 2.81 MIL/uL — ABNORMAL LOW (ref 3.87–5.11)
RDW: 18.9 % — ABNORMAL HIGH (ref 11.5–15.5)
WBC: 13.9 10*3/uL — ABNORMAL HIGH (ref 4.0–10.5)
nRBC: 0 % (ref 0.0–0.2)

## 2019-11-11 LAB — CBC WITH DIFFERENTIAL/PLATELET
Abs Immature Granulocytes: 0.05 10*3/uL (ref 0.00–0.07)
Basophils Absolute: 0.1 10*3/uL (ref 0.0–0.1)
Basophils Relative: 1 %
Eosinophils Absolute: 0 10*3/uL (ref 0.0–0.5)
Eosinophils Relative: 0 %
HCT: 28.7 % — ABNORMAL LOW (ref 36.0–46.0)
Hemoglobin: 9.5 g/dL — ABNORMAL LOW (ref 12.0–15.0)
Immature Granulocytes: 0 %
Lymphocytes Relative: 5 %
Lymphs Abs: 0.6 10*3/uL — ABNORMAL LOW (ref 0.7–4.0)
MCH: 32.6 pg (ref 26.0–34.0)
MCHC: 33.1 g/dL (ref 30.0–36.0)
MCV: 98.6 fL (ref 80.0–100.0)
Monocytes Absolute: 0.2 10*3/uL (ref 0.1–1.0)
Monocytes Relative: 1 %
Neutro Abs: 11.3 10*3/uL — ABNORMAL HIGH (ref 1.7–7.7)
Neutrophils Relative %: 93 %
Platelets: 321 10*3/uL (ref 150–400)
RBC: 2.91 MIL/uL — ABNORMAL LOW (ref 3.87–5.11)
RDW: 18.9 % — ABNORMAL HIGH (ref 11.5–15.5)
WBC: 12.2 10*3/uL — ABNORMAL HIGH (ref 4.0–10.5)
nRBC: 0 % (ref 0.0–0.2)

## 2019-11-11 LAB — URINALYSIS, ROUTINE W REFLEX MICROSCOPIC
Glucose, UA: NEGATIVE mg/dL
Hgb urine dipstick: NEGATIVE
Ketones, ur: 20 mg/dL — AB
Leukocytes,Ua: NEGATIVE
Nitrite: NEGATIVE
Protein, ur: 30 mg/dL — AB
Specific Gravity, Urine: >1.046 — ABNORMAL HIGH (ref 1.005–1.030)
pH: 6 (ref 5.0–8.0)

## 2019-11-11 LAB — BASIC METABOLIC PANEL
Anion gap: 11 (ref 5–15)
BUN: 19 mg/dL (ref 6–20)
CO2: 22 mmol/L (ref 22–32)
Calcium: 8 mg/dL — ABNORMAL LOW (ref 8.9–10.3)
Chloride: 95 mmol/L — ABNORMAL LOW (ref 98–111)
Creatinine, Ser: 0.35 mg/dL — ABNORMAL LOW (ref 0.44–1.00)
GFR calc Af Amer: >60 mL/min (ref >60)
GFR calc non Af Amer: >60 mL/min (ref >60)
Glucose, Bld: 74 mg/dL (ref 70–99)
Potassium: 3.8 mmol/L (ref 3.5–5.1)
Sodium: 128 mmol/L — ABNORMAL LOW (ref 135–145)

## 2019-11-11 MED ORDER — FENTANYL CITRATE (PF) 100 MCG/2ML IJ SOLN
INTRAMUSCULAR | Status: AC
  Filled 2019-11-11: qty 2

## 2019-11-11 MED ORDER — FENTANYL CITRATE (PF) 100 MCG/2ML IJ SOLN
INTRAMUSCULAR | Status: AC | PRN
  Administered 2019-11-11: 50 ug via INTRAVENOUS
  Administered 2019-11-11: 25 ug via INTRAVENOUS

## 2019-11-11 MED ORDER — LACTATED RINGERS IV BOLUS
500.0000 mL | Freq: Once | INTRAVENOUS | Status: AC
  Administered 2019-11-11: 500 mL via INTRAVENOUS

## 2019-11-11 MED ORDER — PIPERACILLIN-TAZOBACTAM 3.375 G IVPB
3.3750 g | Freq: Three times a day (TID) | INTRAVENOUS | Status: DC
  Administered 2019-11-11 – 2019-11-18 (×21): 3.375 g via INTRAVENOUS
  Filled 2019-11-11 (×21): qty 50

## 2019-11-11 MED ORDER — MIDAZOLAM HCL 2 MG/2ML IJ SOLN
INTRAMUSCULAR | Status: AC
  Filled 2019-11-11: qty 2

## 2019-11-11 MED ORDER — ENOXAPARIN SODIUM 40 MG/0.4ML ~~LOC~~ SOLN
40.0000 mg | SUBCUTANEOUS | Status: DC
  Administered 2019-11-12 – 2019-11-23 (×11): 40 mg via SUBCUTANEOUS
  Filled 2019-11-11 (×11): qty 0.4

## 2019-11-11 MED ORDER — IOHEXOL 300 MG/ML  SOLN
75.0000 mL | Freq: Once | INTRAMUSCULAR | Status: AC | PRN
  Administered 2019-11-11: 75 mL via INTRAVENOUS

## 2019-11-11 MED ORDER — MIDAZOLAM HCL 2 MG/2ML IJ SOLN
INTRAMUSCULAR | Status: AC | PRN
  Administered 2019-11-11: 1 mg via INTRAVENOUS
  Administered 2019-11-11: 0.5 mg via INTRAVENOUS

## 2019-11-11 MED ORDER — METHYLPREDNISOLONE SODIUM SUCC 40 MG IJ SOLR
10.0000 mg | Freq: Two times a day (BID) | INTRAMUSCULAR | Status: DC
  Administered 2019-11-12 – 2019-11-16 (×9): 10 mg via INTRAVENOUS
  Filled 2019-11-11 (×10): qty 1

## 2019-11-11 MED ORDER — LACTATED RINGERS IV BOLUS: 500.0000 mL | Freq: Once | INTRAVENOUS | Status: DC

## 2019-11-11 MED ORDER — IOHEXOL 9 MG/ML PO SOLN
ORAL | Status: AC
  Filled 2019-11-11: qty 1000

## 2019-11-11 NOTE — Progress Notes (Signed)
Daily Rounding Note  11/11/2019, 10:01 AM  LOS: 23 days   SUBJECTIVE:   Chief complaint: bowel preforation post colonoscopy.  Crohn's dz     Hypotensive w BP to 80s/60s.  HR 100 Rising WBCs, no fever. Pain in upper/mid abdomen w movement and when abd binder is applied.  No nausea.    OBJECTIVE:         Vital signs in last 24 hours:    Temp:  [97.8 F (36.6 C)-98.3 F (36.8 C)] 98.2 F (36.8 C) (08/11 0800) Pulse Rate:  [87-116] 100 (08/11 0800) Resp:  [16-20] 17 (08/11 0800) BP: (84-109)/(56-71) 84/61 (08/11 0800) SpO2:  [75 %-95 %] 93 % (08/11 0800) Weight:  [51 kg] 51 kg (08/11 0500) Last BM Date: 11/10/19 Filed Weights   11/09/19 0500 11/10/19 0454 11/11/19 0500  Weight: 54 kg 53 kg 51 kg   General: pale, frail, ill but comfortable   Heart: RRR Chest: ronchorous cough, chronic.  No resting dyspnea.   Abdomen: ND, wounds all extensively bandaged.  Bandages clean and dry.  JP drain w thick, liquid, brown, feculent smelling material.    Extremities: thin, some LE edema Neuro/Psych:  Oriented x 3.  No tremor.  Does not seem depressed esp considering all that's occurred.    Intake/Output from previous day: 08/10 0701 - 08/11 0700 In: 600 [P.O.:600] Out: 1930 [Urine:1550; Drains:80; PNTIR:443]  Intake/Output this shift: No intake/output data recorded.  Lab Results: Recent Labs    11/10/19 0415 11/11/19 0010 11/11/19 0500  WBC 8.0 12.2* 13.9*  HGB 8.8* 9.5* 8.8*  HCT 26.9* 28.7* 27.9*  PLT 312 321 303   BMET Recent Labs    11/09/19 0446 11/10/19 0415 11/11/19 0500  NA 131* 133* 128*  K 3.8 3.4* 3.8  CL 98 98 95*  CO2 26 27 22   GLUCOSE 84 85 74  BUN 16 19 19   CREATININE <0.30* <0.30* 0.35*  CALCIUM 8.0* 8.0* 8.0*   LFT Recent Labs    11/10/19 0415  PROT 4.6*  ALBUMIN 1.7*  AST 11*  ALT 23  ALKPHOS 112  BILITOT 0.6  BILIDIR 0.2  IBILI 0.4   PT/INR No results for input(s):  LABPROT, INR in the last 72 hours. Hepatitis Panel No results for input(s): HEPBSAG, HCVAB, HEPAIGM, HEPBIGM in the last 72 hours.  Studies/Results: DG Abd 1 View  Result Date: 11/11/2019 CLINICAL DATA:  54 year old female with history of mesenteric dehiscence and hematoma, status post exploratory laparotomy and colostomy revision postoperative day 7. EXAM: ABDOMEN - 1 VIEW COMPARISON:  CT Abdomen and Pelvis 11/04/2019 and earlier. FINDINGS: Portable AP supine view at 0334 hours. Postoperative drain remains in place in the pelvis. Additional horizontal plastic tubes at the abdomen might be related to ventral abdominal closure and/or wound VAC. Nonspecific but nonobstructed appearing bowel gas pattern. No definite pneumoperitoneum on this supine view. Probably regressed pleural effusions from the prior CT. No acute osseous abnormality identified. IMPRESSION: 1. Nonspecific but nonobstructed bowel gas pattern. 2. Percutaneous drain remains in place to the pelvis. Electronically Signed   By: Genevie Ann M.D.   On: 11/11/2019 03:49   CT ABDOMEN PELVIS W CONTRAST  Result Date: 11/11/2019 CLINICAL DATA:  Right lower quadrant abdominal pain. Known hematoma. EXAM: CT ABDOMEN AND PELVIS WITH CONTRAST TECHNIQUE: Multidetector CT imaging of the abdomen and pelvis was performed using the standard protocol following bolus administration of intravenous contrast. CONTRAST:  55m OMNIPAQUE IOHEXOL 300 MG/ML  SOLN COMPARISON:  CT scan 11/04/2019 FINDINGS: Lower chest: Persistent left-sided pleural effusion with overlying atelectasis. There is also a small amount of pericardial fluid which is stable. The right lung base is clear. Hepatobiliary: No hepatic lesions or intrahepatic biliary dilatation. The gallbladder appears normal. No common bile duct dilatation. Pancreas: No mass, inflammation or ductal dilatation. Spleen: Normal size. No focal lesions. Adrenals/Urinary Tract: The adrenal glands and kidneys are unremarkable.  The bladder appears normal. Stomach/Bowel: The stomach, duodenum, small bowel and colon are unremarkable. No acute inflammatory process, mass lesion or obstructive findings. Left lower quadrant colostomy without complicating features. Stable appearing Hartmann's pouch. No leaking oral contrast is identified. Vascular/Lymphatic: Stable age advanced vascular calcifications. Stable small scattered mesenteric and retroperitoneal lymph nodes, likely reactive. Reproductive: The uterus and ovaries are unremarkable. Other: Stable drainage catheter in the pelvis with a small amount of surrounding fluid and a few dots of air, not unexpected. The large right-sided abdominal/pelvic hematoma is slightly smaller. It measures 13 x 11 x 7 cm and previously measured 14.5 x 12 x 8 cm. Some residual layering hematoma but it appears to be largely liquified now. It now contains a small amount of gas and there is also some rim enhancement. Findings worrisome for infected hematoma. Aspiration/drainage may be indicated. Musculoskeletal: No significant bony findings. IMPRESSION: 1. Stable drainage catheter in the pelvis with a small amount of surrounding fluid and a few dots of air, not unexpected. 2. Slight interval decrease in size of the large right-sided abdominal/pelvic hematoma. It does now contain a small amount of gas and there is also some rim enhancement. Findings worrisome for infected hematoma. Aspiration/drainage may be indicated. 3. Stable left-sided pleural effusion with overlying atelectasis. 4. Stable Hartmann's pouch and left lower quadrant colostomy without complicating features. 5. Stable age advanced vascular calcifications. . Aortic Atherosclerosis (ICD10-I70.0). Electronically Signed   By: Marijo Sanes M.D.   On: 11/11/2019 08:00   Scheduled Meds: . (feeding supplement) PROSource Plus  30 mL Oral Daily  . acetaminophen  650 mg Oral Q6H  . Chlorhexidine Gluconate Cloth  6 each Topical Daily  . enoxaparin  (LOVENOX) injection  40 mg Subcutaneous Q24H  . escitalopram  10 mg Oral Daily  . feeding supplement (ENSURE ENLIVE)  237 mL Oral TID PC & HS  . lidocaine  1 patch Transdermal Q24H  . methocarbamol  1,000 mg Oral Q8H  . methylPREDNISolone (SOLU-MEDROL) injection  15.2 mg Intravenous Q12H  . pantoprazole  40 mg Oral Q0600  . sodium chloride flush  10-40 mL Intracatheter Q12H  . sodium chloride flush  3 mL Intravenous Once  . vitamin A  10,000 Units Oral Daily   Continuous Infusions: . sodium chloride Stopped (10/31/19 0453)  . lactated ringers 10 mL/hr at 11/04/19 1835  . piperacillin-tazobactam (ZOSYN)  IV     PRN Meds:.sodium chloride, guaiFENesin, HYDROmorphone (DILAUDID) injection, ondansetron, sodium chloride flush, traMADol   ASSESMENT:   *   Complicated Crohn's dz, new dx.  Current mgt strategy is IV steroids. No biologics or immunomodulators until pt better healed from surgery.  Dr Hilarie Fredrickson decreased Solumedrol to 30 mg/day, goal is further reduction to 20 mg/day in 2 to 4 days.      *   Sigmoid colon perf.  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 Mesenteric hematoma 7/30 after starting Heparin for infrarenal aortic thrombus.   11/04/19 ex lap, colostomy revision, wound vac to address dehiscence/visible colon.   CTAP w contrast today: slight decrease R abd/pelvic wall  hematoma which contains small amount gas and rim enhancement, worrisome for infection. WBCs have gone from 8 >> 13.9 over the last 24 hours.  No fevers.   Last abx was Zosyn, stopped on 8/9  *   PCM.  On TNA thru 8/7, stopped due to rising LFTs which have subsequently resolved.  Albumin latest of 1.7 .  Eating 10 to 50% of trays.    *   Acute on chronic anemia.  S/p 3 PRBCs 7/30 - 8/1.    *   Hyponatremia.   PLAN   *   Attending MD has ordered restart Zosyn.    *   Dr Hilarie Fredrickson down titrating Solumedrol to 10 mg/bid.    *   IR to evaluate for drainage of hematoma.      Azucena Freed  11/11/2019, 10:01  AM Phone [phone]

## 2019-11-11 NOTE — Sedation Documentation (Signed)
Time Out at 1441 created in error.

## 2019-11-11 NOTE — Progress Notes (Addendum)
Subjective: Interviewed patient at bedside, daughter at bedside.  Doing well this morning, just feels tired because the early CT. Says she is breathing better today. No leaking around ostomy site.  Discussed reason for CT being concerns for dark output from her drain concerning for fecal matter contamination which would indicate leakage of bowel contents. Discussed preliminary CT findings with patient, daughter, said we would discuss further once the official read had been done by Radiology. Patient prefers to go home over SNF, discussed her fragility and SNF would be better than home. Had questions about whether CIR would be covered. Told her that the paperwork had been filed and that it normally takes 2-3 days for a decision to be made. Discussed that Social Worker would be able to answer any further questions. Discussed nutrition, need to eat as well as she can.    Objective:  Vital signs in last 24 hours: Vitals:   11/10/19 2209 11/10/19 2332 11/11/19 0400 11/11/19 0500  BP:  94/68 98/60   Pulse: 100 92 88   Resp:  20 17   Temp:  97.8 F (36.6 C) 98.1 F (36.7 C)   TempSrc:  Oral Oral   SpO2: 94% 92% 93%   Weight:    51 kg  Height:       Physical Exam Vitals and nursing note reviewed.  Constitutional:      General: She is not in acute distress.    Appearance: Normal appearance. She is not ill-appearing or toxic-appearing.  HENT:     Head: Normocephalic and atraumatic.  Cardiovascular:     Rate and Rhythm: Normal rate and regular rhythm.     Pulses: Normal pulses.          Radial pulses are 2+ on the right side and 2+ on the left side.       Dorsalis pedis pulses are 2+ on the right side and 2+ on the left side.     Heart sounds: Normal heart sounds, S1 normal and S2 normal. No murmur heard.   Pulmonary:     Effort: Pulmonary effort is normal. No respiratory distress.     Breath sounds: Normal breath sounds. No wheezing.  Abdominal:     General: There is no  distension.     Palpations: Abdomen is soft. There is no mass.     Tenderness: There is abdominal tenderness.  Musculoskeletal:     Right lower leg: 1+ Edema present.     Left lower leg: 1+ Edema present.  Neurological:     General: No focal deficit present.     Mental Status: She is alert. Mental status is at baseline.  Psychiatric:        Mood and Affect: Mood normal.        Behavior: Behavior normal.        Thought Content: Thought content normal.      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     Crohn's Flare/Sigmoid Colon perforation:  S/P colectomy on 7/24. S/P exploratory Lap early morning 8/5. Ostomy bag has a decent amount brown liquid in it today.  -Continue advancing diet as able -Continue dressing changes on wound  -Per GI reduce solumedrol to 4m IV q12 hours, monitor for flare of diarrhea or hematochezia as a result  Adjustment Disorder with Depressed Mood: Discussed thoughts about starting a medication yesterday and patient was agreeable. After careful selection Escitalopram has been started. Will continue to monitor response. -Continue Escitalopram 10 mg  -Continue to monitor for Serotonin Syndrome due to concurrent Tramadol use   Acute Hypoxia: Over night while rolling to the side for sheet changing her oxygenation dropped to 56%. She was placed on 15 L and sat up in bed, she was able to be weaned down to 6 L. Was on 4 L nasal canula and satting  around 91-95% when interviewed today. Reports coughing up a good bit of mucus and being able to breath much better. -Continue chest physiotherapy q4 while awake -Hold further lasix, appears dry and getting hyponatremic  Hypovolemic Hyponatremia: Today Na is 128, yesterday was 133.  -Gentle IV fluids today    Right peritoneal hematoma: Hemoglobin today is 8.8.  On CT today hematoma has reduced in size. However, It does now contain a small amount of gas and there is also some rim enhancement. Findings worrisome for infected hematoma. Planning for draining with IR today, will get cultures of the fluid at that time.  -Zosyn restarted 8/11 -f/u fluid cultures to determine duration of antibiotics   Severe protein calorie malnutrition Albumin was 1.7 yesterday. Currently on full liquids, will continue to advance diet as tolerated. Reports drinking some liquids and ensure, encouraged to drink and eat as much as able. Patient reports she has been drinking smoothies which she has been enthusiastic about. Dietician consulted, eating about 25% of meals so far. Completed a prolonged course of TPN.    DVT prophx: SCDs and lovenox subcutaneous Diet: full liquids IVF: LR today   Prior to Admission Living Arrangement: Home Anticipated Discharge Location: CIR vs SNF Barriers to Discharge: Complex medical Issues Dispo: Anticipated discharge in approximately 5-7 day(s).   PBriant Cedar MD 11/11/2019, 7:40 AM Pager: [phone] 5pm on weekdays and 1pm on weekends: On Call pager [phone]

## 2019-11-11 NOTE — Consult Note (Signed)
Chief Complaint: Patient was seen in consultation today for CT-guided aspiration/drainage of right abdominal fluid collection Chief Complaint  Patient presents with  . Foot Swelling    Referring Physician(s): Jeanmarie Hubert  Supervising Physician: Aletta Edouard  Patient Status: [facility] - In-pt  History of Present Illness: Natasha Barrera is a 54 y.o. female with past medical history of hypertension, anemia, E. 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  Natasha Barrera is status post partial colectomy with end colostomy on 10/24/2019 with subsequent large mesenteric hematoma noted on 10/30/2019.  On 11/04/2019 Natasha Barrera had fascial dehiscence with visible transverse colon and underwent exploratory lap with colostomy revision, fascial closure, placement of 4 retention sutures and wound VAC along with right JP drain placement.  Follow-up CT abdomen pelvis yesterday revealed:  1. Stable drainage catheter in the pelvis with a small amount of surrounding fluid and a few dots of air, not unexpected. 2. Slight interval decrease in size of the large right-sided abdominal/pelvic hematoma. It does now contain a small amount of gas and there is also some rim enhancement. Findings worrisome for infected hematoma. Aspiration/drainage may be indicated. 3. Stable left-sided pleural effusion with overlying atelectasis. 4. Stable Hartmann's pouch and left lower quadrant colostomy without complicating features. 5. Stable age advanced vascular calcifications   Natasha Barrera is afebrile, but BP is soft with current pressure 90/60; reddish-brown fluid noted from abdominal JP drain; WBC 13.9 up from 12.2, hemoglobin 8.8 down from 9.5; request now received from [facility] for image guided aspiration/drainage of right abdominal fluid collection.  Past Medical History:  Diagnosis Date  . Diverticul disease small  and large intestine, no perforati or abscess   . Diverticulitis of colon   . Hypertension     Past Surgical History:  Procedure Laterality Date  . APPLICATION OF WOUND VAC N/A 11/04/2019   Procedure: APPLICATION OF WOUND VAC;  Surgeon: Donnie Mesa, MD;  Location: [place];  Service: General;  Laterality: N/A;  . BIOPSY  10/21/2019   Procedure: BIOPSY;  Surgeon: Irene Shipper, MD;  Location: [facility] ENDOSCOPY;  Service: Endoscopy;;  . COLON SURGERY  12/25/2011  . COLONOSCOPY    . COLONOSCOPY WITH PROPOFOL N/A 10/21/2019   Procedure: COLONOSCOPY WITH PROPOFOL;  Surgeon: Irene Shipper, MD;  Location: [facility] ENDOSCOPY;  Service: Endoscopy;  Laterality: N/A;  . COLOSTOMY Left 10/24/2019   Procedure: COLOSTOMY;  Surgeon: Clovis Riley, MD;  Location: [place];  Service: General;  Laterality: Left;  . ESOPHAGOGASTRODUODENOSCOPY (EGD) WITH PROPOFOL N/A 10/21/2019   Procedure: ESOPHAGOGASTRODUODENOSCOPY (EGD) WITH PROPOFOL;  Surgeon: Irene Shipper, MD;  Location: [place] ENDOSCOPY;  Service: Endoscopy;  Laterality: N/A;  with small bowel BX's  . [place]  2002  . LAPAROTOMY N/A 10/24/2019   Procedure: EXPLORATORY LAPAROTOMY;  Surgeon: Clovis Riley, MD;  Location: [place];  Service: General;  Laterality: N/A;  . LAPAROTOMY N/A 11/04/2019   Procedure: EXPLORATORY LAPAROTOMY FOR DEHISCENCE;  Surgeon: Donnie Mesa, MD;  Location: [place];  Service: General;  Laterality: N/A;  . VENTRAL HERNIA REPAIR N/A 12/05/2018   Procedure: LAPAROSCOPIC VENTRAL HERNIA REPAIR WITH MESH;  Surgeon: Jovita Kussmaul, MD;  Location: [place];  Service: General;  Laterality: N/A;    Allergies: Patient has no known allergies.  Medications: Prior to Admission medications   Medication Sig Start Date End Date Taking? Authorizing Provider  Calcium Carb-Cholecalciferol (CALCIUM+D3 PO) Take 1 tablet by mouth daily.  Yes [provider]  Cholecalciferol (VITAMIN D-3) 125 MCG (5000 UT) TABS Take 5,000 Units by mouth daily.   Yes  [provider]  loperamide (IMODIUM A-D) 2 MG tablet Take 1 tablet (2 mg total) by mouth 4 (four) times daily as needed for diarrhea or loose stools. 09/20/19  Yes Maudie Mercury, MD  Multiple Vitamin (MULTIVITAMIN WITH MINERALS) TABS tablet Take 1 tablet by mouth daily. Centrum Silver   Yes [provider]  potassium chloride (KLOR-CON) 10 MEQ tablet Take 2 tablets (20 mEq total) by mouth daily. Patient not taking: Reported on 10/18/2019 09/20/19   Maudie Mercury, MD     Family History  Problem Relation Age of Onset  . Breast cancer Neg Hx     Social History   Socioeconomic History  . Marital status: Married    Spouse name: Not on file  . Number of children: Not on file  . Years of education: Not on file  . Highest education level: Not on file  Occupational History  . Not on file  Tobacco Use  . Smoking status: Former Research scientist (life sciences)  . Smokeless tobacco: Never Used  Vaping Use  . Vaping Use: Never used  Substance and Sexual Activity  . Alcohol use: Yes    Comment: occasional  . Drug use: No  . Sexual activity: Not on file  Other Topics Concern  . Not on file  Social History Narrative  . Not on file   Social Determinants of Health   Financial Resource Strain:   . Difficulty of Paying Living Expenses:   Food Insecurity:   . Worried About Charity fundraiser in the Last Year:   . Arboriculturist in the Last Year:   Transportation Needs:   . Film/video editor (Medical):   Marland Kitchen Lack of Transportation (Non-Medical):   Physical Activity:   . Days of Exercise per Week:   . Minutes of Exercise per Session:   Stress:   . Feeling of Stress :   Social Connections:   . Frequency of Communication with Friends and Family:   . Frequency of Social Gatherings with Friends and Family:   . Attends Religious Services:   . Active Member of Clubs or Organizations:   . Attends Archivist Meetings:   Marland Kitchen Marital Status:       Review of Systems denies fever,  headache, chest pain, cough, back pain, nausea, vomiting or visible bleeding.  Natasha Barrera does have some dyspnea, intermittent abdominal pain.   Vital Signs: BP (!) 84/61 (BP Location: Left Arm)   Pulse 100   Temp 98.2 F (36.8 C) (Oral)   Resp 17   Ht 5' 1"  (1.549 m)   Wt 112 lb 7 oz (51 kg)   LMP 12/15/2014   SpO2 93%   BMI 21.24 kg/m   Physical Exam awake, alert.  Chest-slightly diminished breath sounds left base, right clear.  Heart with slightly tachycardic but regular rhythm.  Abdomen soft, right JP drain, ostomy intact with midline wound; abdominal binder in place; some mild diffuse abdominal tenderness; no lower extremity edema.  Imaging: DG Abd 1 View  Result Date: 11/11/2019 CLINICAL DATA:  54 year old female with history of mesenteric dehiscence and hematoma, status post exploratory laparotomy and colostomy revision postoperative day 7. EXAM: ABDOMEN - 1 VIEW COMPARISON:  CT Abdomen and Pelvis 11/04/2019 and earlier. FINDINGS: Portable AP supine view at 0334 hours. Postoperative drain remains in place in the pelvis. Additional horizontal plastic tubes at  the abdomen might be related to ventral abdominal closure and/or wound VAC. Nonspecific but nonobstructed appearing bowel gas pattern. No definite pneumoperitoneum on this supine view. Probably regressed pleural effusions from the prior CT. No acute osseous abnormality identified. IMPRESSION: 1. Nonspecific but nonobstructed bowel gas pattern. 2. Percutaneous drain remains in place to the pelvis. Electronically Signed   By: Genevie Ann M.D.   On: 11/11/2019 03:49   DG Abd 1 View  Result Date: 10/23/2019 CLINICAL DATA:  Abdominal pain and diarrhea. EXAM: ABDOMEN - 1 VIEW COMPARISON:  None. FINDINGS: A single mildly dilated small bowel is seen overlying the left upper quadrant. There is no evidence of free air. No radio-opaque calculi or other significant radiographic abnormality are seen. IMPRESSION: Single mildly dilated small bowel overlying  the left upper quadrant. This may represent focal ileus or early small bowel obstruction. Correlation with abdomen pelvis CT is recommended. Electronically Signed   By: Virgina Norfolk M.D.   On: 10/23/2019 17:03   CT HEAD WO CONTRAST  Result Date: 11/04/2019 CLINICAL DATA:  Mental status change of unknown cause. EXAM: CT HEAD WITHOUT CONTRAST TECHNIQUE: Contiguous axial images were obtained from the base of the skull through the vertex without intravenous contrast. COMPARISON:  None. FINDINGS: Brain: Diffuse cerebral atrophy. No mass effect or midline shift. No abnormal extra-axial fluid collections. Gray-Chason matter junctions are distinct. Basal cisterns are not effaced. No acute intracranial hemorrhage. Vascular: Intracranial arterial vascular calcifications are present. Skull: Normal. Negative for fracture or focal lesion. Sinuses/Orbits: No acute finding. Other: None. IMPRESSION: 1. No acute intracranial abnormalities. 2. Diffuse cerebral atrophy. Electronically Signed   By: Lucienne Capers M.D.   On: 11/04/2019 02:56   CT Angio Chest PE W/Cm &/Or Wo Cm  Result Date: 10/18/2019 CLINICAL DATA:  Shortness of breath, bilateral lower extremity edema EXAM: CT ANGIOGRAPHY CHEST WITH CONTRAST TECHNIQUE: Multidetector CT imaging of the chest was performed using the standard protocol during bolus administration of intravenous contrast. Multiplanar CT image reconstructions and MIPs were obtained to evaluate the vascular anatomy. CONTRAST:  97m OMNIPAQUE IOHEXOL 350 MG/ML SOLN COMPARISON:  CT abdomen pelvis 10/02/2019 FINDINGS: Cardiovascular: Satisfactory opacification the pulmonary arteries to the segmental level. No pulmonary artery filling defects are identified. Central pulmonary arteries are normal caliber. Normal heart size. Trace pericardial fluid. Coronary artery calcifications are present. The aorta is normal caliber. No acute luminal abnormality nor periaortic stranding or hemorrhage. Shared origin  of the brachiocephalic and left common carotid artery. Minimal plaque in the proximal great vessel origins without other acute abnormality. Mediastinum/Nodes: No mediastinal fluid or gas. Normal thyroid gland and thoracic inlet. No acute abnormality of the trachea. Small hiatal hernia, esophagus otherwise unremarkable. No worrisome mediastinal, hilar or axillary adenopathy. Lungs/Pleura: Mild centrilobular predominant emphysematous changes in lungs. Diffuse airways thickening and scattered secretions. No consolidation, features of edema, pneumothorax, or effusion. No suspicious pulmonary nodules or masses. Tiny fat containing right Bochdalek's hernia. Upper Abdomen: No acute abnormalities present in the visualized portions of the upper abdomen. Musculoskeletal: No chest wall mass or suspicious bone lesions identified. Minimal degenerative changes in the spine Review of the MIP images confirms the above findings. IMPRESSION: 1. No evidence of acute pulmonary artery filling defects to suggest pulmonary embolism. 2. No convincing features of edema. 3. Emphysema (ICD10-J43.9). Additional bronchitic changes could be acute or chronic, appearing similar to comparison abdominopelvic CT. Constellation of findings could reflect smoking related changes. Correlate with patient history. 4. Coronary artery atherosclerosis. 5. Aortic Atherosclerosis (ICD10-I70.0) Electronically Signed  By: Lovena Le M.D.   On: 10/18/2019 21:56   CT ABDOMEN PELVIS W CONTRAST  Result Date: 11/11/2019 CLINICAL DATA:  Right lower quadrant abdominal pain. Known hematoma. EXAM: CT ABDOMEN AND PELVIS WITH CONTRAST TECHNIQUE: Multidetector CT imaging of the abdomen and pelvis was performed using the standard protocol following bolus administration of intravenous contrast. CONTRAST:  53m OMNIPAQUE IOHEXOL 300 MG/ML  SOLN COMPARISON:  CT scan 11/04/2019 FINDINGS: Lower chest: Persistent left-sided pleural effusion with overlying atelectasis. There  is also a small amount of pericardial fluid which is stable. The right lung base is clear. Hepatobiliary: No hepatic lesions or intrahepatic biliary dilatation. The gallbladder appears normal. No common bile duct dilatation. Pancreas: No mass, inflammation or ductal dilatation. Spleen: Normal size. No focal lesions. Adrenals/Urinary Tract: The adrenal glands and kidneys are unremarkable. The bladder appears normal. Stomach/Bowel: The stomach, duodenum, small bowel and colon are unremarkable. No acute inflammatory process, mass lesion or obstructive findings. Left lower quadrant colostomy without complicating features. Stable appearing Hartmann's pouch. No leaking oral contrast is identified. Vascular/Lymphatic: Stable age advanced vascular calcifications. Stable small scattered mesenteric and retroperitoneal lymph nodes, likely reactive. Reproductive: The uterus and ovaries are unremarkable. Other: Stable drainage catheter in the pelvis with a small amount of surrounding fluid and a few dots of air, not unexpected. The large right-sided abdominal/pelvic hematoma is slightly smaller. It measures 13 x 11 x 7 cm and previously measured 14.5 x 12 x 8 cm. Some residual layering hematoma but it appears to be largely liquified now. It now contains a small amount of gas and there is also some rim enhancement. Findings worrisome for infected hematoma. Aspiration/drainage may be indicated. Musculoskeletal: No significant bony findings. IMPRESSION: 1. Stable drainage catheter in the pelvis with a small amount of surrounding fluid and a few dots of air, not unexpected. 2. Slight interval decrease in size of the large right-sided abdominal/pelvic hematoma. It does now contain a small amount of gas and there is also some rim enhancement. Findings worrisome for infected hematoma. Aspiration/drainage may be indicated. 3. Stable left-sided pleural effusion with overlying atelectasis. 4. Stable Hartmann's pouch and left lower  quadrant colostomy without complicating features. 5. Stable age advanced vascular calcifications. . Aortic Atherosclerosis (ICD10-I70.0). Electronically Signed   By: PMarijo SanesM.D.   On: 11/11/2019 08:00   CT ABDOMEN PELVIS W CONTRAST  Result Date: 11/04/2019 CLINICAL DATA:  Hematuria. EXAM: CT ABDOMEN AND PELVIS WITH CONTRAST TECHNIQUE: Multidetector CT imaging of the abdomen and pelvis was performed using the standard protocol following bolus administration of intravenous contrast. CONTRAST:  1023mOMNIPAQUE IOHEXOL 300 MG/ML  SOLN COMPARISON:  October 30, 2019. FINDINGS: Lower chest: Stable bilateral pleural effusions are noted with adjacent atelectasis of both lower lobes. Hepatobiliary: No focal liver abnormality is seen. No gallstones, gallbladder wall thickening, or biliary dilatation. Pancreas: Unremarkable. No pancreatic ductal dilatation or surrounding inflammatory changes. Spleen: Normal in size without focal abnormality. Adrenals/Urinary Tract: Adrenal glands are unremarkable. Hyperdense areas seen throughout both kidneys are no longer visualized except for single ill-defined area in lower pole of right kidney. This may represent old infarction. Otherwise the kidneys are unremarkable. No hydronephrosis or renal obstruction is noted. Urinary bladder is unremarkable. Stomach/Bowel: The stomach appears normal. Colostomy is noted in left lower quadrant. There is no evidence of bowel obstruction. Vascular/Lymphatic: Aortic atherosclerosis. No enlarged abdominal or pelvic lymph nodes. Reproductive: Uterus and bilateral adnexa are unremarkable. Other: Large peri-incisional hernia is noted in the epigastric region which contains a loop  of transverse colon, but does not appear to be resulting in obstruction. Surgical drain is noted in the pelvis. 12.0 x 7.8 cm hematoma is noted in the right lower quadrant which is not significantly changed compared to prior exam. 8.3 x 3.5 cm air-fluid collection is seen  anterior to the uterus in the pelvis which is enlarged compared to prior exam and concerning for abscess. Mild anasarca is noted. Musculoskeletal: No acute or significant osseous findings. IMPRESSION: 1. Stable bilateral pleural effusions are noted with adjacent atelectasis of both lower lobes. 2. Hypodense areas seen throughout both kidneys on prior exam are no longer visualized except for single ill-defined area in lower pole of right kidney. This may represent old infarction. 3. Large peri-incisional hernia is noted in the epigastric region which contains a loop of transverse colon, but does not appear to be resulting in obstruction. 4. Colostomy is noted in left lower quadrant. 5. 8.3 x 3.5 cm air-fluid collection is seen anterior to the uterus in the pelvis which is enlarged compared to prior exam and concerning for abscess. Aortic Atherosclerosis (ICD10-I70.0). Electronically Signed   By: Marijo Conception M.D.   On: 11/04/2019 14:26   CT ABDOMEN PELVIS W CONTRAST  Addendum Date: 10/30/2019   ADDENDUM REPORT: 10/30/2019 23:04 ADDENDUM: Critical Value/emergent results were called by telephone at the time of interpretation on 10/30/2019 at 1059 hours to provider Dr. Wynetta Emery Who verbally acknowledged these results. Additionally, please note that Impression #3 and #4 should have been combined into a single Impression as: "New foci of extraluminal gas and increased small volume of low-density fluid along the caudal aspect of the hematoma near the uterine fundus". Electronically Signed   By: Genevie Ann M.D.   On: 10/30/2019 23:04   Result Date: 10/30/2019 CLINICAL DATA:  54 year old female with history of Crohn disease. Perforated rectosigmoid anastomosis. Postoperative day 6 status post exploratory laparotomy, lysis of adhesions, segmental colectomy and end colostomy. Thrombus in the infrarenal aorta on CTA yesterday with solitary, small right renal infarct suspected. EXAM: CT ABDOMEN AND PELVIS WITH CONTRAST  TECHNIQUE: Multidetector CT imaging of the abdomen and pelvis was performed using the standard protocol following bolus administration of intravenous contrast. CONTRAST:  18m OMNIPAQUE IOHEXOL 300 MG/ML  SOLN COMPARISON:  Postoperative CTA abdomen and pelvis 10/29/2019 FINDINGS: Lower chest: Moderate to large bilateral layering pleural effusions are stable since yesterday. Compressive atelectasis. No pericardial effusion. The distal esophagus is fluid-filled with possible hyperenhancing mucosa (series 3, image 2). Hepatobiliary: Negative aside from a small volume of simple appearing perihepatic fluid. Pancreas: Negative. Spleen: Diminutive spleen, with a small volume of perisplenic fluid. Adrenals/Urinary Tract: Negative adrenal glands. Numerous bilateral renal infarcts now, substantially progressed since yesterday. Associated new right hydronephrosis, which may be related to compression of the right ureter from the mesenteric hemorrhage described below. Diminutive urinary bladder with some contrast excretion into the bladder. Stomach/Bowel: Large new since yesterday oval hemorrhage/hematoma in the right peritoneal cavity with a layering hematocrit level encompasses 95 x 113 by 158 mL (AP by transverse by CC) for an estimated blood volume of at least 800 mL. See series 3, image 53, coronal image 54. No associated contrast extravasation identified. Superimposed small volume of low-density free fluid in the right abdomen and pelvis. Stable ventral abdominal percutaneous drain which courses to the lower peritoneal cavity abutting the bladder dome. No free intraperitoneal air, although there is new extraluminal gas along the caudal aspect of the hematoma near the uterine fundus, also detailed below. Gas  and fluid distended stomach. Hematoma mass effect on the 2nd portion of the duodenum. Dilated, air and fluid fills ostomy loop. But no upstream dilated bowel. Vascular/Lymphatic: Aortoiliac calcified atherosclerosis.  The arterial large branches in the abdomen and pelvis appear to remain patent. Portal venous system appears patent. The IVC is slit-like. Reproductive: Increased small volume fluid collection ventral to the uterus with simple fluid density and some associated foci of gas (series 3, image 71). Additional extraluminal appearing gas tracking above the uterine fundus at the pelvic inlet (series 3, image 66). Other: Ventral abdominal wound healing by secondary intention. Musculoskeletal: No acute osseous abnormality identified. IMPRESSION: 1. Large new hemorrhage/hematoma since yesterday in the right peritoneal cavity. Estimated blood volume of at least 800 mL. Hypovolemic appearance of the IVC. Hematoma mass effect on the proximal duodenum and the right ureter. 2. Numerous bilateral renal infarcts, substantially progressed from the CTA yesterday. New right hydronephrosis likely due to the compression of the ureter. 3. Increased small volume fluid collection ventral to the uterus with simple fluid density and some associated foci of gas. 4. New small foci of extraluminal gas along the caudal aspect of the hematoma near the uterine fundus. 5. Dilated colostomy loop, and dilated stomach containing air in fluid. Although no other dilated bowel loops. 6. Superimposed small volume of simple ascites in the abdomen and pelvis. Continued moderate to large layering pleural effusions. Electronically Signed: By: Genevie Ann M.D. On: 10/30/2019 22:47   CT ABDOMEN PELVIS W CONTRAST  Result Date: 10/23/2019 CLINICAL DATA:  Abdominal pain, diarrhea EXAM: CT ABDOMEN AND PELVIS WITH CONTRAST TECHNIQUE: Multidetector CT imaging of the abdomen and pelvis was performed using the standard protocol following bolus administration of intravenous contrast. CONTRAST:  191m OMNIPAQUE IOHEXOL 350 MG/ML SOLN COMPARISON:  10/02/2019, 10/23/2019 FINDINGS: Lower chest: There are small bilateral pleural effusions, with minimal dependent lower lobe  atelectasis. Hepatobiliary: Diffuse hepatic steatosis without focal liver abnormality. The gallbladder is unremarkable. Pancreas: Unremarkable. No pancreatic ductal dilatation or surrounding inflammatory changes. Spleen: Normal in size without focal abnormality. Adrenals/Urinary Tract: Adrenal glands are unremarkable. Kidneys are normal, without renal calculi, focal lesion, or hydronephrosis. Bladder is unremarkable. Stomach/Bowel: There is marked diffuse colonic wall thickening most pronounced from the cecum through the splenic flexure, compatible with inflammatory or infectious colitis. There is extensive pneumoperitoneum, most likely from colonic perforation. Surgical consultation is recommended. No evidence of small-bowel obstruction. Oral contrast has progressed into the colon by the time of the exam. Vascular/Lymphatic: There is marked atherosclerosis of the aorta. Since the previous exam, focal mural thrombus has developed within the infrarenal aorta, reference images 27 through 31, with less than 50% narrowing. No pathologic adenopathy. Reproductive: Uterus and bilateral adnexa are unremarkable. Other: Extensive pneumoperitoneum is seen throughout the upper abdomen most consistent with colonic perforation. Small amount of free fluid is seen within the lower pelvis. There is diffuse body wall edema. Musculoskeletal: No acute or destructive bony lesions. Reconstructed images demonstrate no additional findings. IMPRESSION: 1. Extensive pneumoperitoneum, most consistent with colonic perforation given the persistent diffuse colitis. Surgical consultation is recommended. 2. Interval development of focal mural thrombus within the infrarenal aorta, with less than 50% narrowing. 3. Small bilateral pleural effusions. 4. Diffuse hepatic steatosis. 5. Aortic Atherosclerosis (ICD10-I70.0). These results were called by telephone at the time of interpretation on 10/23/2019 at 10:01 pm to provider MSampson Si, who verbally  acknowledged these results. Electronically Signed   By: MRanda NgoM.D.   On: 10/23/2019 22:05  DG CHEST PORT 1 VIEW  Result Date: 11/07/2019 CLINICAL DATA:  Increased LEFT pleural effusion and LEFT lung atelectasis. EXAM: PORTABLE CHEST 1 VIEW COMPARISON:  11/06/2019 FINDINGS: RIGHT-sided PICC line tip overlies the superior vena cava. There is persistent volume loss on the LEFT, with significant opacity involving the LOWER half of the LEFT hemithorax. There has been some improvement in aeration of the LEFT UPPER hemithorax. RIGHT lung remains clear and hyperexpanded. IMPRESSION: 1. Slight improvement in aeration of the LEFT UPPER hemithorax. 2. Persistent significant opacity involving the LOWER half of the LEFT hemithorax. Electronically Signed   By: Nolon Nations M.D.   On: 11/07/2019 09:19   DG CHEST PORT 1 VIEW  Result Date: 11/06/2019 CLINICAL DATA:  Dyspnea, low oxygen saturation EXAM: PORTABLE CHEST 1 VIEW COMPARISON:  Portable exam 0813 hours compared 11/04/2019 FINDINGS: RIGHT arm PICC line tip projects over SVC. Subtotal opacification of LEFT hemithorax due to increased LEFT pleural effusion and LEFT lung atelectasis. Mild mediastinal shift to LEFT. Abrupt cut off of LEFT lower lobe bronchus question mucous plugging. RIGHT lung hyperinflated and clear. No pneumothorax or acute osseous findings. IMPRESSION: Increased LEFT pleural effusion and LEFT lung atelectasis causing subtotal opacification of the LEFT hemithorax. Question mucous plugging LEFT lower lobe. Electronically Signed   By: Lavonia Dana M.D.   On: 11/06/2019 08:19   DG Chest Port 1 View  Result Date: 11/04/2019 CLINICAL DATA:  Surgery follow-up EXAM: PORTABLE CHEST 1 VIEW COMPARISON:  10/27/2019 FINDINGS: Tip of right-sided PICC line is at the cavoatrial junction. There are hazy opacities at the medial left lung base. Suspected left pleural effusion. Right lung is clear. Normal cardiomediastinal contours. IMPRESSION: Hazy  opacities at the medial left lung base, likely atelectasis. Probable small left pleural effusion. Electronically Signed   By: Ulyses Jarred M.D.   On: 11/04/2019 21:39   DG CHEST PORT 1 VIEW  Result Date: 10/27/2019 CLINICAL DATA:  Shortness of breath and hypoxia EXAM: PORTABLE CHEST 1 VIEW COMPARISON:  10/18/2019 FINDINGS: Cardiac shadow is stable. Right-sided PICC line is noted with the catheter tip at the cavoatrial junction. Increasing bilateral pleural effusions and parenchymal edema are seen when compared with the prior exam consistent progressive congestive failure. IMPRESSION: Changes of progressive congestive failure with effusions and parenchymal edema. Electronically Signed   By: Inez Catalina M.D.   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 Hospital day 12 admitted for Crohn's disease with colitis complicated by rectosigmoid perforation. Thrombus identified within the abdominal aorta on earlier exam. EXAM: CT ANGIOGRAPHY CHEST WITH CONTRAST TECHNIQUE: Multidetector CT imaging of the chest was performed using the standard protocol during bolus administration of intravenous contrast. Multiplanar CT image reconstructions and MIPs were obtained to evaluate the vascular anatomy. CONTRAST:  128m OMNIPAQUE IOHEXOL 350 MG/ML SOLN COMPARISON:  CT angiogram abdomen dated 10/29/2019. CT angiogram chest dated 10/18/2019. FINDINGS: Cardiovascular: No thoracic aortic aneurysm or evidence of aortic dissection. No intraluminal thrombus is seen within the thoracic aorta. There is no central pulmonary embolism identified. Heart size is normal. No pericardial effusion. Mediastinum/Nodes: No mass or enlarged lymph nodes are seen within the mediastinum. Esophagus is unremarkable. Trachea and central bronchi are unremarkable. Lungs/Pleura: Bilateral pleural effusions, moderate to large in size, with  associated compressive atelectasis bilaterally. Additional mild scarring versus edema within each lung. No pneumothorax. Upper Abdomen: Partially imaged fluid collection within the RIGHT upper abdomen,  described as a hematoma on CT abdomen of 10/30/2019, incompletely imaged on this exam. Kidneys are incompletely imaged. No new abnormality seen. Musculoskeletal: No acute or suspicious osseous finding. Review of the MIP images confirms the above findings. IMPRESSION: 1. No thoracic aortic aneurysm or evidence of aortic dissection. No intraluminal thrombus is seen within the thoracic aorta. 2. Bilateral pleural effusions, moderate to large in size, with associated compressive atelectasis bilaterally. 3. Additional mild scarring versus edema within each lung. 4. Partially imaged fluid collection within the RIGHT upper abdomen, described as a large hematoma on CT abdomen of 10/30/2019, incompletely imaged on this exam. Electronically Signed   By: Franki Cabot M.D.   On: 10/31/2019 16:25   ECHOCARDIOGRAM COMPLETE  Result Date: 10/26/2019    ECHOCARDIOGRAM REPORT   Patient Name:   SAKEENA TEALL Copelin Date of Exam: 10/26/2019 Medical Rec #:  [id number]       Height:       61.0 in Accession #:    [id number]      Weight:       169.8 lb Date of Birth:  1965-08-17       BSA:          1.762 m Patient Age:    84 years        BP:           100/84 mmHg Patient Gender: F               HR:           83 bpm. Exam Location:  Inpatient Procedure: 2D Echo and Intracardiac Opacification Agent Indications:     dyspnea 786.09  History:         Patient has no prior history of Echocardiogram examinations.                  Lower extremity edema; Risk Factors:Hypertension.  Sonographer:     Johny Chess Referring Phys:  [place] Diagnosing Phys: Eleonore Chiquito MD IMPRESSIONS  1. Left ventricular ejection fraction, by estimation, is 50 to 55%. The left ventricle has low normal function. The left ventricle has no regional wall  motion abnormalities. Left ventricular diastolic function could not be evaluated.  2. Right ventricular systolic function is normal. The right ventricular size is normal.  3. Moderate pleural effusion in the left lateral region.  4. The mitral valve is grossly normal. Trivial mitral valve regurgitation. No evidence of mitral stenosis.  5. The aortic valve was not well visualized. Aortic valve regurgitation is not visualized. No aortic stenosis is present. FINDINGS  Left Ventricle: Left ventricular ejection fraction, by estimation, is 50 to 55%. The left ventricle has low normal function. The left ventricle has no regional wall motion abnormalities. Definity contrast agent was given IV to delineate the left ventricular endocardial borders. The left ventricular internal cavity size was normal in size. There is no left ventricular hypertrophy. Left ventricular diastolic function could not be evaluated due to nondiagnostic images. Left ventricular diastolic function could not be evaluated. Right Ventricle: The right ventricular size is normal. No increase in right ventricular wall thickness. Right ventricular systolic function is normal. Left Atrium: Left atrial size was normal in size. Right Atrium: Right atrial size was normal in size. Pericardium: Trivial pericardial effusion is present. Presence of pericardial fat pad. Mitral Valve: The mitral valve is grossly normal. Trivial mitral valve regurgitation. No evidence of mitral valve stenosis. Tricuspid Valve: The tricuspid valve is grossly normal. Tricuspid valve  regurgitation is trivial. No evidence of tricuspid stenosis. Aortic Valve: The aortic valve was not well visualized. Aortic valve regurgitation is not visualized. No aortic stenosis is present. Pulmonic Valve: The pulmonic valve was grossly normal. Pulmonic valve regurgitation is not visualized. No evidence of pulmonic stenosis. Aorta: The aortic root is normal in size and structure. Venous: The inferior vena  cava was not well visualized. IAS/Shunts: The atrial septum is grossly normal. Additional Comments: There is a moderate pleural effusion in the left lateral region.  LEFT VENTRICLE PLAX 2D LVIDd:         4.30 cm  Diastology LVIDs:         3.40 cm  LV e' lateral:   9.68 cm/s LV PW:         0.60 cm  LV E/e' lateral: 10.0 LV IVS:        0.60 cm  LV e' medial:    10.40 cm/s LVOT diam:     1.80 cm  LV E/e' medial:  9.3 LVOT Area:     2.54 cm  RIGHT VENTRICLE RV S prime:     14.00 cm/s TAPSE (M-mode): 2.3 cm LEFT ATRIUM             Index LA diam:        3.50 cm 1.99 cm/m LA Vol (A2C):   28.8 ml 16.35 ml/m LA Vol (A4C):   25.0 ml 14.19 ml/m LA Biplane Vol: 27.7 ml 15.72 ml/m   AORTA Ao Root diam: 2.90 cm MITRAL VALVE               TRICUSPID VALVE MV Area (PHT): 3.46 cm    TR Peak grad:   35.0 mmHg MV Decel Time: 219 msec    TR Vmax:        296.00 cm/s MV E velocity: 96.70 cm/s MV A velocity: 91.20 cm/s  SHUNTS MV E/A ratio:  1.06        Systemic Diam: 1.80 cm Eleonore Chiquito MD Electronically signed by Eleonore Chiquito MD Signature Date/Time: 10/26/2019/1:44:11 PM    Final (Updated)    VAS [place] LOWER EXTREMITY VENOUS (DVT)  Result Date: 10/19/2019  Lower Venous DVTStudy Indications: Edema.  Risk Factors: None identified. Comparison Study: No prior studies. Performing Technologist: Oliver Hum RVT  Examination Guidelines: A complete evaluation includes B-mode imaging, spectral Doppler, color Doppler, and power Doppler as needed of all accessible portions of each vessel. Bilateral testing is considered an integral part of a complete examination. Limited examinations for reoccurring indications may be performed as noted. The reflux portion of the exam is performed with the patient in reverse Trendelenburg.  +---------+---------------+---------+-----------+----------+--------------+ RIGHT    CompressibilityPhasicitySpontaneityPropertiesThrombus Aging  +---------+---------------+---------+-----------+----------+--------------+ CFV      Full           Yes      Yes                                 +---------+---------------+---------+-----------+----------+--------------+ SFJ      Full                                                        +---------+---------------+---------+-----------+----------+--------------+ FV Prox  Full                                                        +---------+---------------+---------+-----------+----------+--------------+  FV Mid   Full                                                        +---------+---------------+---------+-----------+----------+--------------+ FV DistalFull                                                        +---------+---------------+---------+-----------+----------+--------------+ PFV      Full                                                        +---------+---------------+---------+-----------+----------+--------------+ POP      Full           Yes      Yes                                 +---------+---------------+---------+-----------+----------+--------------+ PTV      Full                                                        +---------+---------------+---------+-----------+----------+--------------+ PERO     Full                                                        +---------+---------------+---------+-----------+----------+--------------+   +---------+---------------+---------+-----------+----------+--------------+ LEFT     CompressibilityPhasicitySpontaneityPropertiesThrombus Aging +---------+---------------+---------+-----------+----------+--------------+ CFV      Full           Yes      Yes                                 +---------+---------------+---------+-----------+----------+--------------+ SFJ      Full                                                         +---------+---------------+---------+-----------+----------+--------------+ FV Prox  Full                                                        +---------+---------------+---------+-----------+----------+--------------+ FV Mid   Full                                                        +---------+---------------+---------+-----------+----------+--------------+  FV DistalFull                                                        +---------+---------------+---------+-----------+----------+--------------+ PFV      Full                                                        +---------+---------------+---------+-----------+----------+--------------+ POP      Full           Yes      Yes                                 +---------+---------------+---------+-----------+----------+--------------+ PTV      Full                                                        +---------+---------------+---------+-----------+----------+--------------+ PERO     Full                                                        +---------+---------------+---------+-----------+----------+--------------+     Summary: RIGHT: - There is no evidence of deep vein thrombosis in the lower extremity.  - No cystic structure found in the popliteal fossa.  LEFT: - There is no evidence of deep vein thrombosis in the lower extremity.  - No cystic structure found in the popliteal fossa.  *See table(s) above for measurements and observations. Electronically signed by Monica Martinez MD on 10/19/2019 at 2:50:20 PM.    Final    ECHOCARDIOGRAM LIMITED  Result Date: 10/31/2019    ECHOCARDIOGRAM LIMITED REPORT   Patient Name:   HALEEMA VANDERHEYDEN Virtue Date of Exam: 10/31/2019 Medical Rec #:  [id number]       Height:       61.0 in Accession #:    [id number]      Weight:       169.8 lb Date of Birth:  1965/04/14       BSA:          1.762 m Patient Age:    34 years        BP:           109/68 mmHg Patient Gender: F                HR:           108 bpm. Exam Location:  Inpatient Procedure: Limited Color Doppler, Color Doppler and Limited Echo STAT ECHO Indications:    arterial embolism  History:        Patient has prior history of Echocardiogram examinations, most                 recent 10/26/2019.  Sonographer:    Johny Chess Referring Phys: [id number] [employer]  1. Left ventricular ejection fraction, by estimation, is 55 to 60%. The left ventricle has normal function. The left ventricle has no regional wall motion abnormalities.  2. Right ventricular systolic function is normal. The right ventricular size is normal. There is normal pulmonary artery systolic pressure.  3. The mitral valve is normal in structure. No evidence of mitral valve regurgitation. No evidence of mitral stenosis.  4. The aortic valve was not well visualized.  5. Limited echo FINDINGS  Left Ventricle: Left ventricular ejection fraction, by estimation, is 55 to 60%. The left ventricle has normal function. The left ventricle has no regional wall motion abnormalities. Definity contrast agent was given IV to delineate the left ventricular  endocardial borders. The left ventricular internal cavity size was normal in size. There is no left ventricular hypertrophy. Right Ventricle: The right ventricular size is normal. No increase in right ventricular wall thickness. Right ventricular systolic function is normal. There is normal pulmonary artery systolic pressure. The tricuspid regurgitant velocity is 2.78 m/s, and  with an assumed right atrial pressure of 0 mmHg, the estimated right ventricular systolic pressure is 47.6 mmHg. Pericardium: A small pericardial effusion is present. The pericardial effusion is circumferential. Mitral Valve: The mitral valve is normal in structure. No evidence of mitral valve stenosis. Tricuspid Valve: The tricuspid valve is normal in structure. Tricuspid valve regurgitation is trivial. No evidence of tricuspid  stenosis. Aortic Valve: The aortic valve was not well visualized. Pulmonic Valve: The pulmonic valve was not well visualized. Pulmonic valve regurgitation is not visualized. No evidence of pulmonic stenosis. Pulmonary Artery: Indeterminant PASP, inadequate TR jet. TRICUSPID VALVE TR Peak grad:   30.9 mmHg TR Vmax:        278.00 cm/s Carlyle Dolly MD Electronically signed by Carlyle Dolly MD Signature Date/Time: 10/31/2019/3:47:30 PM    Final    [place] EKG SITE RITE  Result Date: 10/24/2019 If Site Rite image not attached, placement could not be confirmed due to current cardiac rhythm.  CT Angio Abd/Pel w/ and/or w/o  Result Date: 10/29/2019 CLINICAL DATA:  54 year old history of pneumoperitoneum and a large perforation in the rectosigmoid region. Patient underwent exploratory laparotomy with lysis of adhesion and segmental colectomy with end colostomy. Patient was also noted to have new thrombus in the abdominal aorta on the CT from 10/23/2019. CTA examination performed in order to follow-up the aortic thrombus. EXAM: CT ANGIOGRAPHY ABDOMEN AND PELVIS WITH CONTRAST AND WITHOUT CONTRAST TECHNIQUE: Multidetector CT imaging of the abdomen and pelvis was performed using the standard protocol during bolus administration of intravenous contrast. Multiplanar reconstructed images and MIPs were obtained and reviewed to evaluate the vascular anatomy. CONTRAST:  167m OMNIPAQUE IOHEXOL 350 MG/ML SOLN COMPARISON:  CT abdomen pelvis 10/23/2019 FINDINGS: VASCULAR Aorta: Normal caliber of the distal descending thoracic aorta. Atherosclerotic calcifications involving the juxtarenal and infrarenal abdominal aorta. Negative for an aortic dissection or aneurysm. Again noted is thrombus along the right side of the infrarenal abdominal aortic wall. This is elongated thrombus and measures roughly 1.3 cm in length. The overall length of the thrombus has not significantly changed but the width has decreased measuring roughly 0.3 cm  opposed to 0.5 cm. This thrombus is new since 10/02/2019. No significant stenosis in the abdominal aorta. Aortic bifurcation is widely patent. Celiac: Patent without evidence of aneurysm, dissection, vasculitis or significant stenosis. SMA: Patent without evidence of aneurysm, dissection, vasculitis or significant stenosis. Renals: Single left renal artery is widely patent without aneurysm, dissection or significant stenosis. There are  2 right-sided renal arteries. Right renal arteries are patent without aneurysm, dissection or significant stenosis. IMA: Patent without evidence of aneurysm, dissection, vasculitis or significant stenosis. Inflow: Patent without evidence of aneurysm, dissection, vasculitis or significant stenosis. Proximal Outflow: Proximal femoral arteries are patent bilaterally. Veins: Hepatic veins are patent. Portal venous system is patent. IVC and renal veins are patent. No gross abnormality to the iliac veins. Review of the MIP images confirms the above findings. NON-VASCULAR Lower chest: Again noted are bilateral pleural effusions. Right effusion has enlarged in size. There may be slight enlargement of the left pleural effusion. Increased compressive atelectasis in both lower lobes. Hepatobiliary: Trace perihepatic ascites. Normal appearance of the gallbladder. No focal liver lesion. No significant biliary dilatation. Pancreas: Unremarkable. No pancreatic ductal dilatation or surrounding inflammatory changes. Spleen: Normal in size without focal abnormality. Adrenals/Urinary Tract: Normal appearance of the adrenal glands. New low-density peripheral wedge-shaped area along the right kidney lower pole. This was not present on the previous examinations and suspect this is related to small infarct based on the aortic thrombus. This area measures up to 1.6 cm. Normal enhancement the left kidney. Normal appearance of the urinary bladder. Stomach/Bowel: Patient now has a Hartmann's pouch which  contains high-density contrast. 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 Again noted is a dilated transverse colon with some wall thickening involving the hepatic flexure and mild wall thickening in the ascending colon. Cecum appears to be fluid-filled and poorly defined. No acute abnormality to the stomach or small bowel. Lymphatic: No significant lymph node enlargement in the abdomen or pelvis. Reproductive: Uterus and bilateral adnexa are unremarkable. Other: Right abdominal surgical drain that extends into the right pelvic region. Poorly defined fluid and gas along the anterior abdominal cavity near the midline incision. There is poorly defined fluid along the anterior abdominal cavity measures roughly 4.5 x 0.9 x 2.4 cm. Fluid-filled structure in the right hemipelvic region appears to be related to the cecum rather than extraluminal fluid collection. Small amount of fluid along the lateral left abdomen sequence 13, image 37. Trace perisplenic fluid. Diffuse subcutaneous edema. Question a small fluid collection between loops of bowel in the mid abdomen on sequence 13, image 51 that measures roughly 1.7 cm. Poorly developed fluid along the posterior aspect of the ascending colon on sequence 13, image 48 measuring up to 1.5 cm. Musculoskeletal: No acute bone abnormality. IMPRESSION: VASCULAR 1. Thrombus along the right side of the infrarenal abdominal aorta has decreased in size compared to the exam on 10/23/2019. Suspect this is related to thromboembolic disease since it was not present on the exam from 10/02/2019. No new arterial thrombus in the abdomen or pelvis. 2.  Aortic Atherosclerosis (ICD10-I70.0). NON-VASCULAR 1. New peripheral wedge-shaped low-density area in the right kidney lower pole. Based on the arterial thrombus, suspect this is related to a small renal infarct. Small focus of pyelonephritis is also in the differential but thought to be less likely. 2. Extensive postoperative changes compatible  with partial colectomy and creation of left abdominal colostomy. 3. Scattered small fluid collections in the abdomen. These small fluid collections are poorly defined and could represent postoperative change. Consider surveillance to exclude developing abscess formations. 4. Bilateral pleural effusions with extensive compressive atelectasis at the lung bases. Right pleural effusion has enlarged compared to the exam on 10/23/2019. 5. Persistent wall thickening involving the right colon. Findings are suggestive for bowel inflammation. Electronically Signed   By: Markus Daft M.D.   On: 10/29/2019 08:58  Labs:  CBC: Recent Labs    11/09/19 0446 11/10/19 0415 11/11/19 0010 11/11/19 0500  WBC 6.4 8.0 12.2* 13.9*  HGB 9.4* 8.8* 9.5* 8.8*  HCT 28.3* 26.9* 28.7* 27.9*  PLT 322 312 321 303    COAGS: Recent Labs    10/31/19 1721 11/06/19 0725  INR 1.2 1.1  APTT  --  32    BMP: Recent Labs    11/08/19 0719 11/09/19 0446 11/10/19 0415 11/11/19 0500  NA 131* 131* 133* 128*  K 4.0 3.8 3.4* 3.8  CL 96* 98 98 95*  CO2 25 26 27 22   GLUCOSE 79 84 85 74  BUN 16 16 19 19   CALCIUM 7.8* 8.0* 8.0* 8.0*  CREATININE <0.30* <0.30* <0.30* 0.35*  GFRNONAA NOT CALCULATED NOT CALCULATED NOT CALCULATED >60  GFRAA NOT CALCULATED NOT CALCULATED NOT CALCULATED >60    LIVER FUNCTION TESTS: Recent Labs    11/02/19 0521 11/04/19 0136 11/05/19 0445 11/10/19 0415  BILITOT 0.7 0.7 0.7 0.6  AST 88* 48* 21 11*  ALT 135* 238* 127* 23  ALKPHOS 150* 159* 126 112  PROT 4.3* 4.4* 4.4* 4.6*  ALBUMIN 1.4* 1.6* 1.9* 1.7*    TUMOR MARKERS: No results for input(s): AFPTM, CEA, CA199, CHROMGRNA in the last 8760 hours.  Assessment and Plan: 54 y.o. female with past medical history of hypertension, anemia, E. 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  Natasha Barrera is status post  partial colectomy with end colostomy on 10/24/2019 with subsequent large mesenteric hematoma noted on 10/30/2019.  On 11/04/2019 Natasha Barrera had fascial dehiscence with visible transverse colon and underwent exploratory lap with colostomy revision, fascial closure, placement of 4 retention sutures and wound VAC along with right JP drain placement.  Follow-up CT abdomen pelvis yesterday revealed:  1. Stable drainage catheter in the pelvis with a small amount of surrounding fluid and a few dots of air, not unexpected. 2. Slight interval decrease in size of the large right-sided abdominal/pelvic hematoma. It does now contain a small amount of gas and there is also some rim enhancement. Findings worrisome for infected hematoma. Aspiration/drainage may be indicated. 3. Stable left-sided pleural effusion with overlying atelectasis. 4. Stable Hartmann's pouch and left lower quadrant colostomy without complicating features. 5. Stable age advanced vascular calcifications   Natasha Barrera is afebrile, but BP is soft with current pressure 90/60; reddish-brown fluid noted from abdominal JP drain; WBC 13.9 up from 12.2, hemoglobin 8.8 down from 9.5; request now received from [facility] for image guided aspiration/drainage of right abdominal fluid collection.  Imaging studies were reviewed by Dr. Kathlene Cote.Risks and benefits discussed with the patient including bleeding, infection, damage to adjacent structures, bowel perforation/fistula connection, and sepsis.  All of the patient's questions were answered, patient is agreeable to proceed. Consent signed and in chart.  Procedure tentatively scheduled for today   Thank you for this interesting consult.  I greatly enjoyed meeting Natasha Barrera and look forward to participating in their care.  A copy of this report was sent to the requesting provider on this date.  Electronically Signed: D. Rowe Robert, PA-C 11/11/2019, 10:21 AM   I spent a total of  40 minutes   in face to face in  clinical consultation, greater than 50% of which was counseling/coordinating care for CT-guided aspiration/drainage of right abdominal fluid collection

## 2019-11-11 NOTE — Progress Notes (Addendum)
At 2030, as this RN was emptying the patient's colostomy bag, it began to leak from the top, thus soiling the patient's midline abdominal dressing and wound. RN thoroughly irrigated the wound with sterile water to remove the stool from the wound bed. RN then replaced the colostomy bag, packed the abdominal wound with wet gauze and covered it with an ABD pad, and cleaned/dressed the JP drain.   While turning the patient to change her soiled sheets, she desaturated to 56% with a good pleth and complained of difficulty breathing. RN sat patient up and placed a NRB mask on the patient at 15L/min. Patient slowly improved and her anxiety decreased. Once she was maintaining her sats, she was switched back to the nasal cannula and placed on 6L/min, then weaned to 4L/min.  Furthermore, RN refrained from pulling JP drain per order given that it began putting out brownish-red fluid again. RN notified MD who came to assess the patient and said to keep the JP drain in for the meantime.  Patient resting in bed now.   0624: 40cc's brownish-red fluid that smells of stool drained from JP drain

## 2019-11-11 NOTE — Procedures (Signed)
Interventional Radiology Procedure Note  Procedure: CT Guided Drainage of right abdominal abscess  Complications: None  Estimated Blood Loss: < 10 mL  Findings: 12 Fr drain placed in right abdominal fluid collection with return of dark bloody and foul-smelling fluid. Fluid sample sent for culture analysis. Drain attached to suction bulb drainage.  Will follow.  Venetia Night. Kathlene Cote, M.D Pager:  [phone]

## 2019-11-11 NOTE — Progress Notes (Signed)
Pt will be going to for CT guided drain placement this am. NPO and patient is aware. Pt. Stated provider discussed procedure and had patient sign consent. CHG wipes/bath given. Colostomy pouch emptied. Discussed with provider getting 2 piece and provider also getting ostomy consult as wounds are so close together and risk of cross contamination. IV Zosyn given per order and infusing now prior to procedure. [place] at 0800. GI provider in to see patient as well. Orders received. RUE PICC intact, all line flushed. Abd/ostomy/drain intact provider assessed while in room. Readied for procedure. Simmie Davies RN

## 2019-11-11 NOTE — Progress Notes (Signed)
Pharmacy Antibiotic Note  Natasha Barrera is a 54 y.o. female admitted on 10/18/2019 with leg swelling.  She is s/p treatment for E.coli bacteremia.  CT shows large right-sided abd/pelvic hematoma with small amount of gas concerning for infected hematoma.  Pharmacy has been consulted for Zosyn dosing.  Rena function stable, afebrile, WBC up 13.9.  Plan: Zosyn EID 3.375gm IV Q8H Pharmacy will sign off as renal function is stable.  Thank you for the consult!  Height: 5' 1"  (154.9 cm) Weight: 51 kg (112 lb 7 oz) IBW/kg (Calculated) : 47.8  Temp (24hrs), Avg:98.1 F (36.7 C), Min:97.8 F (36.6 C), Max:98.3 F (36.8 C)  Recent Labs  Lab 11/06/19 0725 11/06/19 1807 11/08/19 0719 11/09/19 0446 11/10/19 0415 11/11/19 0010 11/11/19 0500  WBC 9.5   < > 9.7 6.4 8.0 12.2* 13.9*  CREATININE 0.32*  --  <0.30* <0.30* <0.30*  --  0.35*   < > = values in this interval not displayed.    Estimated Creatinine Clearance: 60 mL/min (A) (by C-G formula based on SCr of 0.35 mg/dL (L)).    No Known Allergies  Redford Behrle D. Mina Marble, PharmD, BCPS, [place] 11/11/2019, 9:44 AM

## 2019-11-11 NOTE — Progress Notes (Signed)
7 Days Post-Op   Subjective/Chief Complaint: No complaints, no n/v, tol diet, drain increased last night and character changed   Objective: Vital signs in last 24 hours: Temp:  [97.8 F (36.6 C)-98.3 F (36.8 C)] 98.2 F (36.8 C) (08/11 0800) Pulse Rate:  [87-116] 100 (08/11 0800) Resp:  [16-20] 17 (08/11 0800) BP: (84-109)/(56-71) 84/61 (08/11 0800) SpO2:  [75 %-95 %] 93 % (08/11 0800) Weight:  [51 kg] 51 kg (08/11 0500) Last BM Date: 11/10/19  Intake/Output from previous day: 08/10 0701 - 08/11 0700 In: 600 [P.O.:600] Out: 1930 [Urine:1550; Drains:80; Stool:300] Intake/Output this shift: No intake/output data recorded.  Gen: Alert, NAD, pleasant Card: RRR Pulm: rate and effort normal Abd: Soft,ND, nontender,drain withwhat I think is old blood, ostomy viable with gas/liquid stool in pouch, wound together without infection  Lab Results:  Recent Labs    11/11/19 0010 11/11/19 0500  WBC 12.2* 13.9*  HGB 9.5* 8.8*  HCT 28.7* 27.9*  PLT 321 303   BMET Recent Labs    11/10/19 0415 11/11/19 0500  NA 133* 128*  K 3.4* 3.8  CL 98 95*  CO2 27 22  GLUCOSE 85 74  BUN 19 19  CREATININE <0.30* 0.35*  CALCIUM 8.0* 8.0*   PT/INR No results for input(s): LABPROT, INR in the last 72 hours. ABG No results for input(s): PHART, HCO3 in the last 72 hours.  Invalid input(s): PCO2, PO2  Studies/Results: DG Abd 1 View  Result Date: 11/11/2019 CLINICAL DATA:  54 year old female with history of mesenteric dehiscence and hematoma, status post exploratory laparotomy and colostomy revision postoperative day 7. EXAM: ABDOMEN - 1 VIEW COMPARISON:  CT Abdomen and Pelvis 11/04/2019 and earlier. FINDINGS: Portable AP supine view at 0334 hours. Postoperative drain remains in place in the pelvis. Additional horizontal plastic tubes at the abdomen might be related to ventral abdominal closure and/or wound VAC. Nonspecific but nonobstructed appearing bowel gas pattern. No definite  pneumoperitoneum on this supine view. Probably regressed pleural effusions from the prior CT. No acute osseous abnormality identified. IMPRESSION: 1. Nonspecific but nonobstructed bowel gas pattern. 2. Percutaneous drain remains in place to the pelvis. Electronically Signed   By: Genevie Ann M.D.   On: 11/11/2019 03:49   CT ABDOMEN PELVIS W CONTRAST  Result Date: 11/11/2019 CLINICAL DATA:  Right lower quadrant abdominal pain. Known hematoma. EXAM: CT ABDOMEN AND PELVIS WITH CONTRAST TECHNIQUE: Multidetector CT imaging of the abdomen and pelvis was performed using the standard protocol following bolus administration of intravenous contrast. CONTRAST:  41m OMNIPAQUE IOHEXOL 300 MG/ML  SOLN COMPARISON:  CT scan 11/04/2019 FINDINGS: Lower chest: Persistent left-sided pleural effusion with overlying atelectasis. There is also a small amount of pericardial fluid which is stable. The right lung base is clear. Hepatobiliary: No hepatic lesions or intrahepatic biliary dilatation. The gallbladder appears normal. No common bile duct dilatation. Pancreas: No mass, inflammation or ductal dilatation. Spleen: Normal size. No focal lesions. Adrenals/Urinary Tract: The adrenal glands and kidneys are unremarkable. The bladder appears normal. Stomach/Bowel: The stomach, duodenum, small bowel and colon are unremarkable. No acute inflammatory process, mass lesion or obstructive findings. Left lower quadrant colostomy without complicating features. Stable appearing Hartmann's pouch. No leaking oral contrast is identified. Vascular/Lymphatic: Stable age advanced vascular calcifications. Stable small scattered mesenteric and retroperitoneal lymph nodes, likely reactive. Reproductive: The uterus and ovaries are unremarkable. Other: Stable drainage catheter in the pelvis with a small amount of surrounding fluid and a few dots of air, not unexpected. The large right-sided  abdominal/pelvic hematoma is slightly smaller. It measures 13 x 11 x 7  cm and previously measured 14.5 x 12 x 8 cm. Some residual layering hematoma but it appears to be largely liquified now. It now contains a small amount of gas and there is also some rim enhancement. Findings worrisome for infected hematoma. Aspiration/drainage may be indicated. Musculoskeletal: No significant bony findings. IMPRESSION: 1. Stable drainage catheter in the pelvis with a small amount of surrounding fluid and a few dots of air, not unexpected. 2. Slight interval decrease in size of the large right-sided abdominal/pelvic hematoma. It does now contain a small amount of gas and there is also some rim enhancement. Findings worrisome for infected hematoma. Aspiration/drainage may be indicated. 3. Stable left-sided pleural effusion with overlying atelectasis. 4. Stable Hartmann's pouch and left lower quadrant colostomy without complicating features. 5. Stable age advanced vascular calcifications. . Aortic Atherosclerosis (ICD10-I70.0). Electronically Signed   By: Marijo Sanes M.D.   On: 11/11/2019 08:00    Anti-infectives: Anti-infectives (From admission, onward)   Start     Dose/Rate Route Frequency Ordered Stop   11/06/19 0600  ceFAZolin (ANCEF) IVPB 2g/100 mL premix  Status:  Discontinued        2 g 200 mL/hr over 30 Minutes Intravenous Every 8 hours 11/05/19 0914 11/08/19 1133   11/04/19 0600  cefTRIAXone (ROCEPHIN) 2 g in sodium chloride 0.9 % 100 mL IVPB  Status:  Discontinued        2 g 200 mL/hr over 30 Minutes Intravenous Every 24 hours 11/04/19 0500 11/05/19 0914   10/31/19 1700  vancomycin (VANCOCIN) IVPB 1000 mg/200 mL premix  Status:  Discontinued        1,000 mg 200 mL/hr over 60 Minutes Intravenous Every 12 hours 10/31/19 0742 11/01/19 1032   10/31/19 1000  vancomycin (VANCOREADY) IVPB 750 mg/150 mL  Status:  Discontinued        750 mg 150 mL/hr over 60 Minutes Intravenous Every 12 hours 10/31/19 0057 10/31/19 0742   10/31/19 0900  meropenem (MERREM) 2 g in sodium chloride 0.9  % 100 mL IVPB  Status:  Discontinued        2 g 200 mL/hr over 30 Minutes Intravenous Every 8 hours 10/31/19 0742 11/01/19 1032   10/31/19 0200  vancomycin (VANCOREADY) IVPB 1500 mg/300 mL        1,500 mg 150 mL/hr over 120 Minutes Intravenous  Once 10/31/19 0057 10/31/19 0700   10/31/19 0200  piperacillin-tazobactam (ZOSYN) IVPB 3.375 g  Status:  Discontinued        3.375 g 12.5 mL/hr over 240 Minutes Intravenous Every 8 hours 10/31/19 0057 10/31/19 0726   10/24/19 0600  piperacillin-tazobactam (ZOSYN) IVPB 3.375 g  Status:  Discontinued        3.375 g 12.5 mL/hr over 240 Minutes Intravenous Every 8 hours 10/23/19 2305 10/29/19 0932   10/24/19 0000  piperacillin-tazobactam (ZOSYN) IVPB 4.5 g  Status:  Discontinued        4.5 g 200 mL/hr over 30 Minutes Intravenous Every 6 hours 10/23/19 2257 10/23/19 2302   10/23/19 2315  piperacillin-tazobactam (ZOSYN) IVPB 3.375 g        3.375 g 100 mL/hr over 30 Minutes Intravenous STAT 10/23/19 2302 10/23/19 2347      Assessment/Plan: 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 Henrene Pastor, per GI, started Solumedrol 7/27;per note from 8/9  likely need 4 to 6 weeks of wound healing before we consider biologic therapy  Sigmoid colon perforation-  - 10/24/19:partial colectomy and end colostomy by Dr. Kae Heller. POD#18 - 10/30/19: large mesenteric hematoma noted, hep gtt held - 11/04/19:fascial dehiscence with visible transverse colon: S/P exploratory laparotomy, colostomy revision, fascial closure, placement 4 retention sutures, placement of wound VAC, Dr. Driscilla Grammes - BID wet to dry dressing changes - JP drain with change in character, ct overnight shows right sided hematoma/fluid with some gas possible infection- will ask IR to see if anything possible with this, I restarted her zosyn as wbc up with these findings -  tolerating soft diet and ostomy functioning Protein calorie malnutrition - prealbumibn(8/2)14.2, off TPN Acute on chronic anemia- s/p 3u pRBC 7/30-8/1,Hgb8.8, stable FEN: Ensure, soft diet ID: [id number]/29;Ancef 8/6 for bacteremia (1/2 blood Cx 7/31 showed E.Coli)>>8/8 VTE: SCD's, lovenox Foley:out 8/6     Rolm Bookbinder 11/11/2019

## 2019-11-11 NOTE — Progress Notes (Addendum)
Subjective:  I was paged that Ms. Zufall was having increased JP drain output. Her nurse states that she emptied her drain after it put out about 40cc's of red/brown fluid and that she has continued to drain about 20 cc's of fluid. Flow rate has remained constant since about 8:00pm. Her nurse informed me that her colostomy bag leaked stool when she went to change the bag at the start of her shift around 8:30pm, and she spent a significant amount of time cleaning and irrigating the wound prior to changing her dressings. The patient had a single episode where her SpO2 dropped to 56% when she was being turned on her side to grab the bedding. Her nurse states she called RT who placed a non-rebreather mask, although the patient's respiratory status improved after about 30 minutes and she is now comfortable without any shortness of breath or difficulty breathing on 4L [place]. Ms. Frank denies any increased abdominal pain, swelling, nausea, vomiting, light-headedness, dizziness, or any other symptoms.   Objective:  General: Patient is resting comfortably in bed. No acute distress. Eyes: Sclera non-icteric. No conjunctival injection.  Respiratory: Lungs are CTA, bilaterally. No wheezes, rales, or rhonchi.  Cardiovascular: Patient is borderline tachycardic with regular rhythm. No murmurs, rubs, or gallops.  Abdominal: Midline incision is covered by dressings. Area immediately surrounding incision is not tender to palpation. No flank hematoma visualized. No rebound or guarding. JP drain has about 20cc's of opaque, brownish-red fluid in it that smells of stool, without leakage around the insertion site. Skin: Slight facial pallor present. There are scattered ecchymosis on her bilateral arms. Midline abdominal incision is covered by dressings. No leakage of fluid around JP drain.  Psych: Normal affect. Normal tone of voice.   Assessment/Plan:  1. Active JP Drainage Ms. Malmberg has had about 60cc's of opaque,  brownish-red fluid drainage into her JP over a span of 5 hours that smelled of stool. Most likely due to irrigation of patient's wound earlier this evening, although cannot rule out bowel perforation vs. Slow intra-abdominal bleed. - Addendum: STAT CBC showed stable hemoglobin of 9.4 and leukocytosis (WBC 12.2, ANC 11.3) - Will get STAT abdominal XR to r/o perforation  - Will discuss with surgery  2. Hypoxic Respiratory Failure Nurse reports single episode of desaturation to 56% while patient was being turned on her side earlier this evening. Reports RT was notified. Currently stable on 4L [place] with SpO2 ~ 90-92% - Continue to monitor on 4L [place]  Jeralyn Bennett, MD 11/11/2019, 4:05am Pager: [phone] On Call Pager 7pm-7am: [phone]

## 2019-11-11 NOTE — Progress Notes (Signed)
Physical Therapy Treatment Patient Details Name: Natasha Barrera MRN: [id number] DOB: Feb 01, 1966 Today's Date: 11/11/2019    History of Present Illness Natasha Barrera is a 54 year old female with PMH significant for HTN and diverticulitis (s/p sigmoid colectomy 2015) who originally presented with complaints of LE edema with later reports of abdominal pain and decreased appetite.  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 s/p exp laparatomy for dehiscence, colostomy revision, and wound vac placement 8/5.    PT Comments    Pt presents with moderate abdominal pain and significant anxiety with mobility, but motivated to get to recliner this session. Pt required min-mod of two people to progress OOB and into stedy, but tolerated ~5 minutes upright in stedy and stood x2 with PT/OT assist. Pt demonstrates posterior LE tightness secondary to decreased mobility, pt instructed in LE stretching to improve ROM. PT to continue to follow acutely, pt appropriate for CIR to maximize mobility prior to d/c home, especially now that pt is progressing.      Follow Up Recommendations  CIR     Equipment Recommendations  Rolling walker with 5" wheels;3in1 (PT)    Recommendations for Other Services       Precautions / Restrictions Precautions Precautions: Fall Precaution Comments: JP drain R flank, colostomy, abdominal binder when OOB Restrictions Weight Bearing Restrictions: No    Mobility  Bed Mobility Overal bed mobility: Needs Assistance Bed Mobility: Supine to Sit     Supine to sit: Min assist;+2 for safety/equipment;HOB elevated     General bed mobility comments: Min +2 for trunk elevation, cuing for scooting to EOB. Very elevated HOB and use of bedrails to  comeplete.  Transfers Overall transfer level: Needs assistance Equipment used: Ambulation equipment used Transfers: Sit to/from Stand Sit to Stand: Mod assist;+2 physical assistance;+2 safety/equipment;From elevated surface         General transfer comment: Mod +2 for power up, hip extension, and steadying. Verbal cuing for hand placement when rising, "tuck your hips". Stand x2, from EOB and from stedy seat height.  Ambulation/Gait             General Gait Details: unable   Stairs             Wheelchair Mobility    Modified Rankin (Stroke Patients Only)       Balance Overall balance assessment: Needs assistance Sitting-balance support: Bilateral upper extremity supported;Feet supported Sitting balance-Leahy Scale: Fair     Standing balance support: During functional activity;Bilateral upper extremity supported Standing balance-Leahy Scale: Poor Standing balance comment: reliant on external support                            Cognition Arousal/Alertness: Awake/alert Behavior During Therapy: Anxious Overall Cognitive Status: Impaired/Different from baseline Area of Impairment: Following commands;Problem solving;Awareness;Safety/judgement                       Following Commands: Follows one step commands with increased time Safety/Judgement: Decreased awareness of deficits;Decreased awareness of safety Awareness: Intellectual Problem Solving: Requires verbal cues General Comments: Pt requires frequent and repeated cuing for breathing technique and mobility, pt limited by anxiety. Pt states "I think I am going to have a panic attack" sitting EOB, improved with slowed breathing  and rest.      Exercises General Exercises - Lower Extremity Quad Sets: AAROM;Both;10 reps;Seated Other Exercises Other Exercises: Calf stretch, long sitting, 2x20 seconds bilaterally, for bilateral calf tightness    General Comments General comments (skin  integrity, edema, etc.): HRmax 130, SpO2 88-92% on 3LO2      Pertinent Vitals/Pain Pain Assessment: Faces Faces Pain Scale: Hurts even more Pain Location: abdomen Pain Descriptors / Indicators: Operative site guarding;Discomfort;Grimacing;Guarding Pain Intervention(s): Limited activity within patient's tolerance;Monitored during session;Repositioned    Home Living                      Prior Function            PT Goals (current goals can now be found in the care plan section) Acute Rehab PT Goals Patient Stated Goal: to have less pain and less swelling in legs  PT Goal Formulation: With patient Time For Goal Achievement: 11/23/19 Potential to Achieve Goals: Good Progress towards PT goals: Progressing toward goals    Frequency    Min 3X/week      PT Plan Current plan remains appropriate    Co-evaluation PT/OT/SLP Co-Evaluation/Treatment: Yes Reason for Co-Treatment: Complexity of the patient's impairments (multi-system involvement);For patient/therapist safety;To address functional/ADL transfers PT goals addressed during session: Mobility/safety with mobility;Strengthening/ROM OT goals addressed during session: ADL's and self-care;Strengthening/ROM      AM-PAC PT "6 Clicks" Mobility   Outcome Measure  Help needed turning from your back to your side while in a flat bed without using bedrails?: A Little Help needed moving from lying on your back to sitting on the side of a flat bed without using bedrails?: A Little Help needed moving to and from a bed to a chair (including a wheelchair)?: A Lot Help needed standing up from a chair using your arms (e.g., wheelchair or bedside chair)?: A Lot Help needed to walk in hospital room?: Total Help needed climbing 3-5 steps with a railing? : Total 6 Click Score: 12    End of Session Equipment Utilized During Treatment: Oxygen;Other (comment) (abdominal binder) Activity Tolerance: Patient limited by fatigue Patient  left: with call bell/phone within reach;in chair;with chair alarm set (SCDs not working correctly) Nurse Communication: Mobility status;Other (comment) (back to bed with bari stedy - NT notified) PT Visit Diagnosis: Other abnormalities of gait and mobility (R26.89);Muscle weakness (generalized) (M62.81)     Time: 2103-1281 PT Time Calculation (min) (ACUTE ONLY): 25 min  Charges:  $Therapeutic Activity: 8-22 mins                     Genice Kimberlin E, PT Acute Rehabilitation Services Pager [phone]  Office [phone]    Aadit Hagood D [place] 11/11/2019, 11:17 AM

## 2019-11-11 NOTE — Progress Notes (Signed)
Re-examined Natasha Barrera at bedside after procedure. She mentions feeling very tired and thirsty as she has been NPO. States that it took longer than she expected but denies any significant abdominal pain, nausea, fevers or chills. Continues to endorse chest congestion and weak cough. Noted to have soft pressures (95/63) on vitals with left basilar rales on exam. Will provide gentle fluid resuscitation. Will need to watch respiratory status closely.

## 2019-11-11 NOTE — Progress Notes (Signed)
Occupational Therapy Treatment Patient Details Name: Natasha Barrera MRN: [id number] DOB: 1965-08-09 Today's Date: 11/11/2019    History of present illness Natasha Barrera is a 54 year old female with PMH significant for HTN and diverticulitis (s/p sigmoid colectomy 2015) who originally presented with complaints of LE edema with later reports of abdominal pain and decreased appetite.  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 s/p exp laparatomy for dehiscence, colostomy revision, and wound vac placement 8/5.   OT comments  This 54 yo seen today to focus on bed mobility, sitting EOB, tranfers and grooming tasks. She did participate in all of this but was limited due to fatigue as well. Goals updated this session due to goals due. Patient will continue to benefit from acute OT with follow up on CIR.  Follow Up Recommendations  CIR;Supervision/Assistance - 24 hour    Equipment Recommendations  Other (comment) (TBD next venue)    Recommendations for Other Services Rehab consult    Precautions / Restrictions Precautions Precautions: Fall Precaution Comments: JP drain R flank, colostomy, abdominal binder when OOB Restrictions Weight Bearing Restrictions: No       Mobility Bed Mobility Overal bed mobility: Needs Assistance Bed Mobility: Supine to Sit     Supine to sit: Min assist;+2 for safety/equipment;HOB elevated     General bed mobility comments: Min +2 for trunk elevation, cuing for scooting to EOB. Very elevated HOB and use of bedrails to comeplete.  Transfers Overall transfer level: Needs assistance Equipment used: Ambulation equipment used Transfers: Sit to/from Stand Sit to Stand: Mod assist;+2 physical assistance;+2 safety/equipment;From elevated  surface         General transfer comment: Mod +2 for power up, hip extension, and steadying. Verbal cuing for hand placement when rising, "tuck your hips". Stand x2, from EOB and from stedy seat height.    Balance Overall balance assessment: Needs assistance Sitting-balance support: Bilateral upper extremity supported;Feet supported Sitting balance-Leahy Scale: Fair     Standing balance support: During functional activity;Bilateral upper extremity supported Standing balance-Leahy Scale: Poor Standing balance comment: reliant on external support                           ADL either performed or assessed with clinical judgement   ADL Overall ADL's : Needs assistance/impaired       Grooming Details (indicate cue type and reason): setup/S at EOB to wash face, max A to comb/brush hair (tangles and fatigued easily) in recliner                 Toilet Transfer: Moderate assistance;+2 for physical assistance Toilet Transfer Details (indicate cue type and reason): sit>stand with bari stedy                 Vision Patient Visual Report: No change from baseline            Cognition Arousal/Alertness: Awake/alert Behavior During Therapy: Anxious Overall Cognitive Status: Impaired/Different from baseline Area of Impairment: Following commands;Problem solving;Awareness;Safety/judgement                       Following Commands: Follows one step commands with increased time Safety/Judgement: Decreased awareness of deficits;Decreased awareness of safety Awareness: Intellectual Problem Solving: Requires verbal cues General Comments: Pt  requires frequent and repeated cuing for breathing technique and mobility, pt limited by anxiety. Pt states "I think I am going to have a panic attack" sitting EOB, improved with slowed breathing and rest.        Exercises Other Exercises Other Exercises: While seated on bari stedy had her work on reaching up and  touching my hand alternately with her hands, she was only able to do 2 reps each hand before she said she was too tired.      General Comments HRmax 130, SpO2 88-92% on 3LO2    Pertinent Vitals/ Pain       Pain Assessment: Faces Faces Pain Scale: Hurts even more Pain Location: abdomen Pain Descriptors / Indicators: Operative site guarding;Discomfort;Grimacing;Guarding Pain Intervention(s): Limited activity within patient's tolerance;Monitored during session;Repositioned         Frequency  Min 2X/week        Progress Toward Goals  OT Goals(current goals can now be found in the care plan section)  Progress towards OT goals: Not progressing toward goals - comment (has gotten more weak since eval and needs encouragement to participate)  ADL Goals Pt Will Perform Grooming: (P) with set-up;with supervision;sitting (EOB, 3 tasks) Pt Will Perform Upper Body Bathing: (P) with set-up;with supervision;sitting Pt Will Perform Lower Body Bathing: (P) with min assist;with adaptive equipment;sit to/from stand Pt Will Perform Upper Body Dressing: (P) with set-up;with supervision;sitting (EOB) Pt Will Perform Lower Body Dressing: (P) with min assist;with adaptive equipment;sit to/from stand Pt Will Transfer to Toilet: (P) with min assist;stand pivot transfer;bedside commode Pt Will Perform Toileting - Clothing Manipulation and hygiene: (P)  (Pt will be able to stand with min A to A with these tasks)  Plan Discharge plan remains appropriate    Co-evaluation    PT/OT/SLP Co-Evaluation/Treatment: Yes Reason for Co-Treatment: Complexity of the patient's impairments (multi-system involvement);For patient/therapist safety;To address functional/ADL transfers PT goals addressed during session: Mobility/safety with mobility;Strengthening/ROM OT goals addressed during session: ADL's and self-care;Strengthening/ROM      AM-PAC OT "6 Clicks" Daily Activity     Outcome Measure   Help from another  person eating meals?: A Little Help from another person taking care of personal grooming?: A Lot Help from another person toileting, which includes using toliet, bedpan, or urinal?: A Lot Help from another person bathing (including washing, rinsing, drying)?: A Lot Help from another person to put on and taking off regular upper body clothing?: A Lot Help from another person to put on and taking off regular lower body clothing?: Total 6 Click Score: 12    End of Session Equipment Utilized During Treatment: Oxygen (bari stedy)  OT Visit Diagnosis: Unsteadiness on feet (R26.81);Muscle weakness (generalized) (M62.81);Pain Pain - part of body:  (abdomen)   Activity Tolerance Patient limited by fatigue   Patient Left in chair;with call bell/phone within reach;with chair alarm set   Nurse Communication Mobility status (to NT)        Time: 2836-6294 OT Time Calculation (min): 26 min  Charges: OT General Charges $OT Visit: 1 Visit OT Treatments $Self Care/Home Management : 8-22 mins  [place], OTR/L Acute [employer] Pager [phone] Office [phone]      Almon Register 11/11/2019, 10:42 AM

## 2019-11-12 LAB — CBC
HCT: 24.8 % — ABNORMAL LOW (ref 36.0–46.0)
Hemoglobin: 7.9 g/dL — ABNORMAL LOW (ref 12.0–15.0)
MCH: 31.9 pg (ref 26.0–34.0)
MCHC: 31.9 g/dL (ref 30.0–36.0)
MCV: 100 fL (ref 80.0–100.0)
Platelets: 260 10*3/uL (ref 150–400)
RBC: 2.48 MIL/uL — ABNORMAL LOW (ref 3.87–5.11)
RDW: 18.7 % — ABNORMAL HIGH (ref 11.5–15.5)
WBC: 6.7 10*3/uL (ref 4.0–10.5)
nRBC: 0 % (ref 0.0–0.2)

## 2019-11-12 LAB — BASIC METABOLIC PANEL
Anion gap: 8 (ref 5–15)
BUN: 11 mg/dL (ref 6–20)
CO2: 24 mmol/L (ref 22–32)
Calcium: 7.9 mg/dL — ABNORMAL LOW (ref 8.9–10.3)
Chloride: 100 mmol/L (ref 98–111)
Creatinine, Ser: <0.3 mg/dL — ABNORMAL LOW (ref 0.44–1.00)
Glucose, Bld: 81 mg/dL (ref 70–99)
Potassium: 3.4 mmol/L — ABNORMAL LOW (ref 3.5–5.1)
Sodium: 132 mmol/L — ABNORMAL LOW (ref 135–145)

## 2019-11-12 NOTE — Progress Notes (Signed)
Subjective: Interviewed patient at bedside.  States she feels better today, got some sleep last night.  Discussed findings s/p draining, pending Cx, susceptibilities, plan moving forward w/ drainage lines, plan for rehab  Discussed holding lasix today, encouraged PO intake.  Objective:  Vital signs in last 24 hours: Vitals:   11/12/19 0143 11/12/19 0339 11/12/19 0400 11/12/19 0500  BP: (!) 102/58 (!) 98/59 (!) 97/51   Pulse: 65 62 65   Resp:      Temp:  98.1 F (36.7 C)    TempSrc:  Axillary    SpO2: 98% 99% 98%   Weight:    50.6 kg  Height:       Physical Exam Vitals and nursing note reviewed.  Constitutional:      General: She is not in acute distress.    Appearance: She is not ill-appearing or toxic-appearing.  HENT:     Head: Normocephalic and atraumatic.  Cardiovascular:     Rate and Rhythm: Normal rate and regular rhythm.     Pulses: Normal pulses.          Radial pulses are 2+ on the right side and 2+ on the left side.       Dorsalis pedis pulses are 2+ on the right side and 2+ on the left side.     Heart sounds: Normal heart sounds, S1 normal and S2 normal. No murmur heard.   Pulmonary:     Effort: Pulmonary effort is normal. No respiratory distress.     Breath sounds: Wheezing present.  Abdominal:     General: There is no distension.     Palpations: There is no mass.     Tenderness: There is abdominal tenderness.     Comments: Dressing clean, dry, and intact.  Musculoskeletal:     Right lower leg: No edema.     Left lower leg: No edema.  Neurological:     Mental Status: She is alert.  Psychiatric:        Mood and Affect: Mood normal.        Behavior: Behavior normal.        Thought Content: Thought content normal.      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   Crohn's Flare/Sigmoid Colon perforation:S/Pcolectomy on 7/24.S/P exploratory [place] morning8/5.Ostomy bag has a decent amount brown liquid in it today. -Continue advancing diet as able -Continue dressing changes on wound  -Per GIcontinue solumedrolto 42m IV q12 hours -Continue to monitor for flare of diarrhea or hematochezia    Acute Hypoxia:Currently on 2 L nasal canula. Reports continuing to cough up mucus Reports being able to breath ok. -Hold further lasix, appears dry and getting hyponatremic   Hypovolemic Hyponatremia (resolved): Today Na is 132.  -Gentle IV fluids todaydue to infected hematoma -Holding lasix today   Right peritoneal hematoma:Hemoglobintoday is7.9.On CT yesterday the hematoma had reduced in size but did have signs concerning for infection. Drained by IR yesterday, culture showed abundant gram negative rods.  -Continue Zosyn 3.375 g  q8 (day 2) -follow up susceptibility for possible antibiotic changes   Adjustment Disorder with Depressed Mood:Discussed thoughts about starting a medication yesterday and patient was agreeable.After careful selection Escitalopram hasbeen started. Will continue to monitor response. -ContinueEscitalopram 10 mg  -Continue to monitor for Serotonin Syndrome due to concurrent Tramadol use   Severe protein calorie malnutrition: Albuminwas 1.7 on 8/10.Currently onfull liquids, will continue to advance diet as tolerated.Dietician consulted, eating about 25% of meals. -Continually encouraging eating and drinking as much as possible   DVT prophx:  SCDsandlovenoxsubcutaneous Diet:full liquids IVF:LR today   Prior to Admission Living Arrangement: Home Anticipated Discharge Location: CIR vs SNF Barriers to Discharge: Complex Medical Issues Dispo: Anticipated discharge in approximately 5-7 day(s).   Briant Cedar, MD 11/12/2019, 6:05 AM Pager: [phone] After 5pm on weekdays and 1pm on weekends: On Call pager [phone]

## 2019-11-12 NOTE — Progress Notes (Signed)
[facility] Progress Note  8 Days Post-Op  Subjective: CC-  Sore from IR drain but overall feeling better.  WBC 6.7, afebrile. Colostomy functioning.  Objective: Vital signs in last 24 hours: Temp:  [97.3 F (36.3 C)-98.4 F (36.9 C)] 97.8 F (36.6 C) (08/12 0737) Pulse Rate:  [62-110] 74 (08/12 0737) Resp:  [15-26] 15 (08/12 0737) BP: (89-112)/(51-69) 102/62 (08/12 0737) SpO2:  [93 %-100 %] 98 % (08/12 0737) Weight:  [50.6 kg] 50.6 kg (08/12 0500) Last BM Date: 11/10/19  Intake/Output from previous day: 08/11 0701 - 08/12 0700 In: 709.7 [IV Piggyback:504.7] Out: 1025 [Drains:675; Stool:350] Intake/Output this shift: No intake/output data recorded.  PE: Gen: Alert, NAD, pleasant Card: RRR Pulm: rate and effort normal Abd: Soft,ND, mild TTP around IR drain, IR drain with old blood,JP drain withold blood, ostomy viable with gas/liquid stool in pouch,midline incision clean with retention sutures in place  Lab Results:  Recent Labs    11/11/19 0500 11/12/19 0706  WBC 13.9* 6.7  HGB 8.8* 7.9*  HCT 27.9* 24.8*  PLT 303 260   BMET Recent Labs    11/11/19 0500 11/12/19 0706  NA 128* 132*  K 3.8 3.4*  CL 95* 100  CO2 22 24  GLUCOSE 74 81  BUN 19 11  CREATININE 0.35* <0.30*  CALCIUM 8.0* 7.9*   PT/INR No results for input(s): LABPROT, INR in the last 72 hours. CMP     Component Value Date/Time   NA 132 (L) 11/12/2019 0706   K 3.4 (L) 11/12/2019 0706   CL 100 11/12/2019 0706   CO2 24 11/12/2019 0706   GLUCOSE 81 11/12/2019 0706   BUN 11 11/12/2019 0706   CREATININE <0.30 (L) 11/12/2019 0706   CALCIUM 7.9 (L) 11/12/2019 0706   PROT 4.6 (L) 11/10/2019 0415   ALBUMIN 1.7 (L) 11/10/2019 0415   AST 11 (L) 11/10/2019 0415   ALT 23 11/10/2019 0415   ALKPHOS 112 11/10/2019 0415   BILITOT 0.6 11/10/2019 0415   GFRNONAA NOT CALCULATED 11/12/2019 0706   GFRAA NOT CALCULATED 11/12/2019 0706   Lipase     Component Value Date/Time    LIPASE 18 09/19/2019 1631       Studies/Results: DG Abd 1 View  Result Date: 11/11/2019 CLINICAL DATA:  54 year old female with history of mesenteric dehiscence and hematoma, status post exploratory laparotomy and colostomy revision postoperative day 7. EXAM: ABDOMEN - 1 VIEW COMPARISON:  CT Abdomen and Pelvis 11/04/2019 and earlier. FINDINGS: Portable AP supine view at 0334 hours. Postoperative drain remains in place in the pelvis. Additional horizontal plastic tubes at the abdomen might be related to ventral abdominal closure and/or wound VAC. Nonspecific but nonobstructed appearing bowel gas pattern. No definite pneumoperitoneum on this supine view. Probably regressed pleural effusions from the prior CT. No acute osseous abnormality identified. IMPRESSION: 1. Nonspecific but nonobstructed bowel gas pattern. 2. Percutaneous drain remains in place to the pelvis. Electronically Signed   By: Genevie Ann M.D.   On: 11/11/2019 03:49   CT ABDOMEN PELVIS W CONTRAST  Result Date: 11/11/2019 CLINICAL DATA:  Right lower quadrant abdominal pain. Known hematoma. EXAM: CT ABDOMEN AND PELVIS WITH CONTRAST TECHNIQUE: Multidetector CT imaging of the abdomen and pelvis was performed using the standard protocol following bolus administration of intravenous contrast. CONTRAST:  34m OMNIPAQUE IOHEXOL 300 MG/ML  SOLN COMPARISON:  CT scan 11/04/2019 FINDINGS: Lower chest: Persistent left-sided pleural effusion with overlying atelectasis. There is also a small amount of pericardial fluid which is stable.  The right lung base is clear. Hepatobiliary: No hepatic lesions or intrahepatic biliary dilatation. The gallbladder appears normal. No common bile duct dilatation. Pancreas: No mass, inflammation or ductal dilatation. Spleen: Normal size. No focal lesions. Adrenals/Urinary Tract: The adrenal glands and kidneys are unremarkable. The bladder appears normal. Stomach/Bowel: The stomach, duodenum, small bowel and colon are  unremarkable. No acute inflammatory process, mass lesion or obstructive findings. Left lower quadrant colostomy without complicating features. Stable appearing Hartmann's pouch. No leaking oral contrast is identified. Vascular/Lymphatic: Stable age advanced vascular calcifications. Stable small scattered mesenteric and retroperitoneal lymph nodes, likely reactive. Reproductive: The uterus and ovaries are unremarkable. Other: Stable drainage catheter in the pelvis with a small amount of surrounding fluid and a few dots of air, not unexpected. The large right-sided abdominal/pelvic hematoma is slightly smaller. It measures 13 x 11 x 7 cm and previously measured 14.5 x 12 x 8 cm. Some residual layering hematoma but it appears to be largely liquified now. It now contains a small amount of gas and there is also some rim enhancement. Findings worrisome for infected hematoma. Aspiration/drainage may be indicated. Musculoskeletal: No significant bony findings. IMPRESSION: 1. Stable drainage catheter in the pelvis with a small amount of surrounding fluid and a few dots of air, not unexpected. 2. Slight interval decrease in size of the large right-sided abdominal/pelvic hematoma. It does now contain a small amount of gas and there is also some rim enhancement. Findings worrisome for infected hematoma. Aspiration/drainage may be indicated. 3. Stable left-sided pleural effusion with overlying atelectasis. 4. Stable Hartmann's pouch and left lower quadrant colostomy without complicating features. 5. Stable age advanced vascular calcifications. . Aortic Atherosclerosis (ICD10-I70.0). Electronically Signed   By: Marijo Sanes M.D.   On: 11/11/2019 08:00   CT IMAGE GUIDED DRAINAGE BY PERCUTANEOUS CATHETER  Result Date: 11/11/2019 CLINICAL DATA:  History of Crohn's disease with multiple abdominal surgeries and colectomy. Development postoperative fluid collection in the right abdomen suspicious for infected fluid collection.  EXAM: CT GUIDED CATHETER DRAINAGE OF RIGHT-SIDED PERITONEAL ABSCESS ANESTHESIA/SEDATION: 1.5 mg IV Versed 75 mcg IV Fentanyl Total Moderate Sedation Time:  23 minutes The patient's level of consciousness and physiologic status were continuously monitored during the procedure by Radiology nursing. PROCEDURE: The procedure, risks, benefits, and alternatives were explained to the patient. Questions regarding the procedure were encouraged and answered. The patient understands and consents to the procedure. A time out was performed prior to initiating the procedure. CT was performed through the abdomen and pelvis in a supine position with the right side rolled up slightly. The right abdominal wall was prepped with chlorhexidine in a sterile fashion, and a sterile drape was applied covering the operative field. A sterile gown and sterile gloves were used for the procedure. Local anesthesia was provided with 1% Lidocaine. Under CT guidance, an 18 gauge trocar needle was advanced to the level of a right-sided peritoneal fluid collection. After confirming needle tip position, fluid was aspirated. A guidewire was advanced through the needle and the needle removed. The percutaneous tract was dilated over the guidewire. A 12 French percutaneous drainage catheter was advanced over the wire and formed. Catheter position was confirmed by CT. The catheter was connected to a suction bulb and secured at the skin with a Prolene retention suture and StatLock device. COMPLICATIONS: None FINDINGS: Aspiration at the level of right mid to lower abdominal fluid collection yielded foul-smelling, dark bloody fluid. A sample was sent for culture analysis. After placement of the drainage catheter, there  is good return of fluid to suction bulb drainage. IMPRESSION: CT-guided percutaneous catheter drainage of right mid to lower abdominal fluid collection yielding foul-smelling, dark bloody fluid. A sample was sent for culture analysis. A 10  French drain was placed and attached to suction bulb drainage. Electronically Signed   By: Aletta Edouard M.D.   On: 11/11/2019 17:09    Anti-infectives: Anti-infectives (From admission, onward)   Start     Dose/Rate Route Frequency Ordered Stop   11/11/19 1100  piperacillin-tazobactam (ZOSYN) IVPB 3.375 g     Discontinue     3.375 g 12.5 mL/hr over 240 Minutes Intravenous Every 8 hours 11/11/19 0945     11/06/19 0600  ceFAZolin (ANCEF) IVPB 2g/100 mL premix  Status:  Discontinued        2 g 200 mL/hr over 30 Minutes Intravenous Every 8 hours 11/05/19 0914 11/08/19 1133   11/04/19 0600  cefTRIAXone (ROCEPHIN) 2 g in sodium chloride 0.9 % 100 mL IVPB  Status:  Discontinued        2 g 200 mL/hr over 30 Minutes Intravenous Every 24 hours 11/04/19 0500 11/05/19 0914   10/31/19 1700  vancomycin (VANCOCIN) IVPB 1000 mg/200 mL premix  Status:  Discontinued        1,000 mg 200 mL/hr over 60 Minutes Intravenous Every 12 hours 10/31/19 0742 11/01/19 1032   10/31/19 1000  vancomycin (VANCOREADY) IVPB 750 mg/150 mL  Status:  Discontinued        750 mg 150 mL/hr over 60 Minutes Intravenous Every 12 hours 10/31/19 0057 10/31/19 0742   10/31/19 0900  meropenem (MERREM) 2 g in sodium chloride 0.9 % 100 mL IVPB  Status:  Discontinued        2 g 200 mL/hr over 30 Minutes Intravenous Every 8 hours 10/31/19 0742 11/01/19 1032   10/31/19 0200  vancomycin (VANCOREADY) IVPB 1500 mg/300 mL        1,500 mg 150 mL/hr over 120 Minutes Intravenous  Once 10/31/19 0057 10/31/19 0700   10/31/19 0200  piperacillin-tazobactam (ZOSYN) IVPB 3.375 g  Status:  Discontinued        3.375 g 12.5 mL/hr over 240 Minutes Intravenous Every 8 hours 10/31/19 0057 10/31/19 0726   10/24/19 0600  piperacillin-tazobactam (ZOSYN) IVPB 3.375 g  Status:  Discontinued        3.375 g 12.5 mL/hr over 240 Minutes Intravenous Every 8 hours 10/23/19 2305 10/29/19 0932   10/24/19 0000  piperacillin-tazobactam (ZOSYN) IVPB 4.5 g  Status:   Discontinued        4.5 g 200 mL/hr over 30 Minutes Intravenous Every 6 hours 10/23/19 2257 10/23/19 2302   10/23/19 2315  piperacillin-tazobactam (ZOSYN) IVPB 3.375 g        3.375 g 100 mL/hr over 30 Minutes Intravenous STAT 10/23/19 2302 10/23/19 2347       Assessment/Plan 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 Henrene Pastor, per GI, started Solumedrol 7/27;per note from 8/9likely need 4 to 6 weeks of wound healing before we consider biologic therapy, decreased steroids 8/11  Sigmoid colon perforation-  - 10/24/19:partial colectomy and end colostomy by Dr. Kae Heller. POD#19 - 10/30/19: large mesenteric hematoma noted, hep gtt held - 11/04/19:fascial dehiscence with visible transverse colon: S/P exploratory laparotomy, colostomy revision, fascial closure, placement 4 retention sutures, placement of wound VAC, Dr. Baruch Gouty -  8/11: right abdominal abscess, s/p IR  drain, culture pending, continue zosyn and follow culture - BID wet to dry dressing changes -continue JP drain - tolerating soft diet and ostomy functioning Protein calorie malnutrition - prealbumibn(8/2)14.2, off TPN Acute on chronic anemia- s/p 3u pRBC 7/30-8/1,Hgb7.9, stable FEN: Ensure,soft diet ID: [id number]/29;Ancef 8/6 for bacteremia (1/2 blood Cx 7/31 showed E.Coli)>>8/8, zosyn 8/11>day#2 VTE: SCD's, lovenox Foley:out 8/6    LOS: 24 days    Natasha Barrera, [facility] 11/12/2019, 9:35 AM Please see Amion for pager number during day hours 7:00am-4:30pm

## 2019-11-12 NOTE — Consult Note (Addendum)
[place] Nurse ostomy follow up Surgical team following for assessment and plan of care for abd wound.  Pouch was changed yesterday by the bedside nurse.   Stoma type/location: Stoma is red and viable when visualized thought the pouch, which is intact with a good seal. Red rubber retention sutures are in close proximity to the pouching surface; which create a difficult pouching situation.  Pt will probably need a belt when transferred to rehab and out of bed; demonstrated procedure for application. . Reviewed pouching routines with patient and husband at bedside.  Pt will always need to use barrier ring and flexible convex pouch, since stoma is flush with skin level. 4 sets of supplies left in the room for staff nurse use. Reviewed ordering supplies after discharge and they ask appropriate questions.  Ostomy pouching: 1pc convex with barrier ring, mod amt liquid brown stool in the pouch.   Enrolled patient in [place] Discharge program: Yes, previously. Julien Girt MSN, RN, [place], [place], [place]

## 2019-11-12 NOTE — Progress Notes (Signed)
Referring Physician(s): Dr Jeanmarie Hubert  Supervising Physician: Corrie Mckusick  Patient Status:  [facility] - In-pt  Chief Complaint:  RLQ abs abscess drain  Subjective:  Drain placed in IR yesterday Feeling some better today Abd sore Staying in bed for now    Allergies: Patient has no known allergies.  Medications: Prior to Admission medications   Medication Sig Start Date End Date Taking? Authorizing Provider  Calcium Carb-Cholecalciferol (CALCIUM+D3 PO) Take 1 tablet by mouth daily.   Yes [provider]  Cholecalciferol (VITAMIN D-3) 125 MCG (5000 UT) TABS Take 5,000 Units by mouth daily.   Yes [provider]  loperamide (IMODIUM A-D) 2 MG tablet Take 1 tablet (2 mg total) by mouth 4 (four) times daily as needed for diarrhea or loose stools. 09/20/19  Yes Maudie Mercury, MD  Multiple Vitamin (MULTIVITAMIN WITH MINERALS) TABS tablet Take 1 tablet by mouth daily. Centrum Silver   Yes [provider]  potassium chloride (KLOR-CON) 10 MEQ tablet Take 2 tablets (20 mEq total) by mouth daily. Patient not taking: Reported on 10/18/2019 09/20/19   Maudie Mercury, MD     Vital Signs: BP (!) 101/59 (BP Location: Left Arm)   Pulse 80   Temp 97.7 F (36.5 C) (Oral)   Resp 13   Ht 5' 1"  (1.549 m)   Wt 111 lb 8.8 oz (50.6 kg)   LMP 12/15/2014   SpO2 99%   BMI 21.08 kg/m   Physical Exam Skin:    General: Skin is warm and dry.     Comments: Site is clean and dry Tender to touch OP bloody Odorous 75 cc yesterday--  GNR  Neurological:     Mental Status: She is alert.     Imaging: DG Abd 1 View  Result Date: 11/11/2019 CLINICAL DATA:  54 year old female with history of mesenteric dehiscence and hematoma, status post exploratory laparotomy and colostomy revision postoperative day 7. EXAM: ABDOMEN - 1 VIEW COMPARISON:  CT Abdomen and Pelvis 11/04/2019 and earlier. FINDINGS: Portable AP supine view at 0334 hours. Postoperative drain remains in  place in the pelvis. Additional horizontal plastic tubes at the abdomen might be related to ventral abdominal closure and/or wound VAC. Nonspecific but nonobstructed appearing bowel gas pattern. No definite pneumoperitoneum on this supine view. Probably regressed pleural effusions from the prior CT. No acute osseous abnormality identified. IMPRESSION: 1. Nonspecific but nonobstructed bowel gas pattern. 2. Percutaneous drain remains in place to the pelvis. Electronically Signed   By: Genevie Ann M.D.   On: 11/11/2019 03:49   CT ABDOMEN PELVIS W CONTRAST  Result Date: 11/11/2019 CLINICAL DATA:  Right lower quadrant abdominal pain. Known hematoma. EXAM: CT ABDOMEN AND PELVIS WITH CONTRAST TECHNIQUE: Multidetector CT imaging of the abdomen and pelvis was performed using the standard protocol following bolus administration of intravenous contrast. CONTRAST:  67m OMNIPAQUE IOHEXOL 300 MG/ML  SOLN COMPARISON:  CT scan 11/04/2019 FINDINGS: Lower chest: Persistent left-sided pleural effusion with overlying atelectasis. There is also a small amount of pericardial fluid which is stable. The right lung base is clear. Hepatobiliary: No hepatic lesions or intrahepatic biliary dilatation. The gallbladder appears normal. No common bile duct dilatation. Pancreas: No mass, inflammation or ductal dilatation. Spleen: Normal size. No focal lesions. Adrenals/Urinary Tract: The adrenal glands and kidneys are unremarkable. The bladder appears normal. Stomach/Bowel: The stomach, duodenum, small bowel and colon are unremarkable. No acute inflammatory process, mass lesion or obstructive findings. Left lower quadrant colostomy without complicating features. Stable appearing Hartmann's  pouch. No leaking oral contrast is identified. Vascular/Lymphatic: Stable age advanced vascular calcifications. Stable small scattered mesenteric and retroperitoneal lymph nodes, likely reactive. Reproductive: The uterus and ovaries are unremarkable. Other:  Stable drainage catheter in the pelvis with a small amount of surrounding fluid and a few dots of air, not unexpected. The large right-sided abdominal/pelvic hematoma is slightly smaller. It measures 13 x 11 x 7 cm and previously measured 14.5 x 12 x 8 cm. Some residual layering hematoma but it appears to be largely liquified now. It now contains a small amount of gas and there is also some rim enhancement. Findings worrisome for infected hematoma. Aspiration/drainage may be indicated. Musculoskeletal: No significant bony findings. IMPRESSION: 1. Stable drainage catheter in the pelvis with a small amount of surrounding fluid and a few dots of air, not unexpected. 2. Slight interval decrease in size of the large right-sided abdominal/pelvic hematoma. It does now contain a small amount of gas and there is also some rim enhancement. Findings worrisome for infected hematoma. Aspiration/drainage may be indicated. 3. Stable left-sided pleural effusion with overlying atelectasis. 4. Stable Hartmann's pouch and left lower quadrant colostomy without complicating features. 5. Stable age advanced vascular calcifications. . Aortic Atherosclerosis (ICD10-I70.0). Electronically Signed   By: Marijo Sanes M.D.   On: 11/11/2019 08:00   CT IMAGE GUIDED DRAINAGE BY PERCUTANEOUS CATHETER  Result Date: 11/11/2019 CLINICAL DATA:  History of Crohn's disease with multiple abdominal surgeries and colectomy. Development postoperative fluid collection in the right abdomen suspicious for infected fluid collection. EXAM: CT GUIDED CATHETER DRAINAGE OF RIGHT-SIDED PERITONEAL ABSCESS ANESTHESIA/SEDATION: 1.5 mg IV Versed 75 mcg IV Fentanyl Total Moderate Sedation Time:  23 minutes The patient's level of consciousness and physiologic status were continuously monitored during the procedure by Radiology nursing. PROCEDURE: The procedure, risks, benefits, and alternatives were explained to the patient. Questions regarding the procedure were  encouraged and answered. The patient understands and consents to the procedure. A time out was performed prior to initiating the procedure. CT was performed through the abdomen and pelvis in a supine position with the right side rolled up slightly. The right abdominal wall was prepped with chlorhexidine in a sterile fashion, and a sterile drape was applied covering the operative field. A sterile gown and sterile gloves were used for the procedure. Local anesthesia was provided with 1% Lidocaine. Under CT guidance, an 18 gauge trocar needle was advanced to the level of a right-sided peritoneal fluid collection. After confirming needle tip position, fluid was aspirated. A guidewire was advanced through the needle and the needle removed. The percutaneous tract was dilated over the guidewire. A 12 French percutaneous drainage catheter was advanced over the wire and formed. Catheter position was confirmed by CT. The catheter was connected to a suction bulb and secured at the skin with a Prolene retention suture and StatLock device. COMPLICATIONS: None FINDINGS: Aspiration at the level of right mid to lower abdominal fluid collection yielded foul-smelling, dark bloody fluid. A sample was sent for culture analysis. After placement of the drainage catheter, there is good return of fluid to suction bulb drainage. IMPRESSION: CT-guided percutaneous catheter drainage of right mid to lower abdominal fluid collection yielding foul-smelling, dark bloody fluid. A sample was sent for culture analysis. A 10 French drain was placed and attached to suction bulb drainage. Electronically Signed   By: Aletta Edouard M.D.   On: 11/11/2019 17:09    Labs:  CBC: Recent Labs    11/10/19 0415 11/11/19 0010 11/11/19 0500 11/12/19  0706  WBC 8.0 12.2* 13.9* 6.7  HGB 8.8* 9.5* 8.8* 7.9*  HCT 26.9* 28.7* 27.9* 24.8*  PLT 312 321 303 260    COAGS: Recent Labs    10/31/19 1721 11/06/19 0725  INR 1.2 1.1  APTT  --  32     BMP: Recent Labs    11/09/19 0446 11/10/19 0415 11/11/19 0500 11/12/19 0706  NA 131* 133* 128* 132*  K 3.8 3.4* 3.8 3.4*  CL 98 98 95* 100  CO2 26 27 22 24   GLUCOSE 84 85 74 81  BUN 16 19 19 11   CALCIUM 8.0* 8.0* 8.0* 7.9*  CREATININE <0.30* <0.30* 0.35* <0.30*  GFRNONAA NOT CALCULATED NOT CALCULATED >60 NOT CALCULATED  GFRAA NOT CALCULATED NOT CALCULATED >60 NOT CALCULATED    LIVER FUNCTION TESTS: Recent Labs    11/02/19 0521 11/04/19 0136 11/05/19 0445 11/10/19 0415  BILITOT 0.7 0.7 0.7 0.6  AST 88* 48* 21 11*  ALT 135* 238* 127* 23  ALKPHOS 150* 159* 126 112  PROT 4.3* 4.4* 4.4* 4.6*  ALBUMIN 1.4* 1.6* 1.9* 1.7*    Assessment and Plan:  Sig colon perforation - 10/24/19:partial colectomy and end colostomy by Dr. Kae Heller. POD#19 - 10/30/19: large mesenteric hematoma noted, hep gtt held - 11/04/19:fascial dehiscence with visible transverse colon: S/P exploratory laparotomy, colostomy revision, fascial closure, placement 4 retention sutures, placement of wound VAC, Dr. Baruch Gouty -  8/11: right abdominal abscess, s/p IR drain, culture pending, continue zosyn and follow culture  Will follow Plan per [facility]  Electronically Signed: Lavonia Drafts, PA-C 11/12/2019, 2:36 PM   I spent a total of 15 Minutes at the the patient's bedside AND on the patient's hospital floor or unit, greater than 50% of which was counseling/coordinating care for RLQ abscess drain

## 2019-11-13 DIAGNOSIS — K529 Noninfective gastroenteritis and colitis, unspecified: Secondary | ICD-10-CM | POA: Diagnosis not present

## 2019-11-13 DIAGNOSIS — B9620 Unspecified Escherichia coli [E. coli] as the cause of diseases classified elsewhere: Secondary | ICD-10-CM

## 2019-11-13 DIAGNOSIS — R7881 Bacteremia: Secondary | ICD-10-CM

## 2019-11-13 DIAGNOSIS — K631 Perforation of intestine (nontraumatic): Secondary | ICD-10-CM | POA: Diagnosis not present

## 2019-11-13 DIAGNOSIS — K50118 Crohn's disease of large intestine with other complication: Secondary | ICD-10-CM | POA: Diagnosis not present

## 2019-11-13 LAB — CBC
HCT: 24.6 % — ABNORMAL LOW (ref 36.0–46.0)
Hemoglobin: 7.7 g/dL — ABNORMAL LOW (ref 12.0–15.0)
MCH: 31.2 pg (ref 26.0–34.0)
MCHC: 31.3 g/dL (ref 30.0–36.0)
MCV: 99.6 fL (ref 80.0–100.0)
Platelets: 257 10*3/uL (ref 150–400)
RBC: 2.47 MIL/uL — ABNORMAL LOW (ref 3.87–5.11)
RDW: 18.3 % — ABNORMAL HIGH (ref 11.5–15.5)
WBC: 5.1 10*3/uL (ref 4.0–10.5)
nRBC: 0 % (ref 0.0–0.2)

## 2019-11-13 LAB — QUANTIFERON-TB GOLD PLUS: QuantiFERON-TB Gold Plus: UNDETERMINED — AB

## 2019-11-13 LAB — QUANTIFERON-TB GOLD PLUS (RQFGPL)
QuantiFERON Mitogen Value: 0 IU/mL
QuantiFERON Nil Value: 0 IU/mL
QuantiFERON TB1 Ag Value: 0 IU/mL
QuantiFERON TB2 Ag Value: 0 IU/mL

## 2019-11-13 LAB — BASIC METABOLIC PANEL
Anion gap: 5 (ref 5–15)
BUN: 11 mg/dL (ref 6–20)
CO2: 26 mmol/L (ref 22–32)
Calcium: 7.7 mg/dL — ABNORMAL LOW (ref 8.9–10.3)
Chloride: 98 mmol/L (ref 98–111)
Creatinine, Ser: <0.3 mg/dL — ABNORMAL LOW (ref 0.44–1.00)
Glucose, Bld: 103 mg/dL — ABNORMAL HIGH (ref 70–99)
Potassium: 3.8 mmol/L (ref 3.5–5.1)
Sodium: 129 mmol/L — ABNORMAL LOW (ref 135–145)

## 2019-11-13 LAB — THIOPURINE METHYLTRANSFERASE (TPMT), RBC

## 2019-11-13 MED ORDER — LACTATED RINGERS IV SOLN
INTRAVENOUS | Status: AC
  Administered 2019-11-13: 100 mL via INTRAVENOUS

## 2019-11-13 MED ORDER — MELATONIN 3 MG PO TABS
3.0000 mg | ORAL_TABLET | Freq: Every evening | ORAL | Status: DC | PRN
  Administered 2019-11-18 – 2019-11-19 (×2): 3 mg via ORAL
  Filled 2019-11-13 (×2): qty 1

## 2019-11-13 NOTE — Progress Notes (Addendum)
Subjective:  Interviewed patient at bedside.  Reports feeling tired, but okay this AM. She is trying to eat, but feels full and sometimes things taste weird. Ate some cheerios.   Reports still coughing up mucus, but breathing feels better.  Have encouraged ambulation with PT/OT, reports she wants to try and sit in chair later today. Encouraged continued working with incentive spirometer.  Objective:  Vital signs in last 24 hours: Vitals:   11/13/19 0331 11/13/19 0400 11/13/19 0414 11/13/19 0528  BP: (!) 95/57 (!) 95/56    Pulse: 70 65    Resp:      Temp: (!) 97.5 F (36.4 C)     TempSrc: Oral     SpO2: 100% 100%    Weight:   52.2 kg 52.3 kg  Height:       Physical Exam Vitals and nursing note reviewed.  Constitutional:      General: She is not in acute distress.    Appearance: Normal appearance. She is not ill-appearing or toxic-appearing.  HENT:     Head: Normocephalic and atraumatic.  Cardiovascular:     Rate and Rhythm: Normal rate and regular rhythm.     Pulses: Normal pulses.          Radial pulses are 2+ on the right side and 2+ on the left side.       Dorsalis pedis pulses are 2+ on the right side and 2+ on the left side.     Heart sounds: Normal heart sounds, S1 normal and S2 normal. No murmur heard.   Abdominal:     General: There is no distension.     Comments: Dressing clean, dry, and intact. Pelvic and Hematoma drains in place.  Musculoskeletal:     Right lower leg: No edema.     Left lower leg: No edema.  Skin:         Comments: Erythematous, no skin break down  Neurological:     General: No focal deficit present.     Mental Status: She is alert. Mental status is at baseline.  Psychiatric:        Mood and Affect: Mood normal.        Behavior: Behavior normal.        Thought Content: Thought content normal.      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   Crohn's Flare/Sigmoid Colon perforation:S/Pcolectomy on 7/24.S/P exploratory [place] morning8/5.Ostomy bag has adecent amountbrown liquid in it today.Has tolerated decreases in Solumedrol will continue to monitor. -Continue advancing diet as able -Continue dressing changes on wound  -Per GIcontinue solumedrolto 165mIV q12 hours -Continue to monitor for flare of diarrhea or hematochezia    Acute Hypoxia:Currently on 2 L nasal canula. Reports continuing to cough up mucus Reports being able to breath ok. -Hold further lasix, appears dry and getting hyponatremic   HypovolemicHyponatremia:TodayNa is 129. -Continue gentle IV fluids LR at 100 cc/hr for 10 hrs -Holding lasix today   Right peritoneal hematoma:Hemoglobintoday is7.7.On CT yesterday the hematoma  had reduced in size but did have signs concerning for infection. Drained by IR yesterday, culture showed abundant gram negative rods. Initial sensitivity has resulted still holding for possible anaerobes. Will continue with Zosyn until culture finalized. -Continue Zosyn 3.375 g q8 (day 3) -follow up susceptibility for possible antibiotic changes   Adjustment Disorder with Depressed Mood:Discussed thoughts about starting a medication yesterday and patient was agreeable.After careful selection Escitalopram hasbeen started. Willcontinue tomonitor response. -ContinueEscitalopram 10 mg  -Continue tomonitor for Serotonin Syndrome due to concurrent Tramadol use   Severe  protein calorie malnutrition: Albuminwas 1.7on 8/10.Currently onfull liquids, will continue to advance diet as tolerated.Was able to eat some cheerios, only able to eat a little at a time before feeling full but still able to eat. -Continually encouraging eating and drinking as much as possible   Left Heel: Has developed redness on her heel. Not present on Right heel. Have discussed that getting out of bed is essential so that this does not develop into a pressure ulcer. She states she will get up to the room chair today. Encouraged this.  -Encouraged getting out of bed and sitting in chair -Encouraged working with PT/OT as much as possible -Ordered K pad. -Daily checks   DVT prophx: SCDsandlovenoxsubcutaneous Diet:full liquids IVF:1L LR today at 100cc/hr   Prior to Admission Living Arrangement: Home Anticipated Discharge Location: CIR Barriers to Discharge: IV medication Dispo: Anticipated discharge in approximately 2-3 day(s).   Briant Cedar, MD 11/13/2019, 5:46 AM Pager: [phone] After 5pm on weekdays and 1pm on weekends: On Call pager [phone]

## 2019-11-13 NOTE — Progress Notes (Signed)
PT Cancellation Note  Patient Details Name: Natasha Barrera MRN: [id number] DOB: [birth year]   Cancelled Treatment:    Reason Eval/Treat Not Completed: Fatigue/lethargy limiting ability to participate;Other (comment) - Pt fatigued and requesting to eat breakfast; states that PT and OT left her "high and dry" by leaving her in the chair last session and she now has distrust for this PT. Pt states she will do exercises in bed today, requests this PT not check back on her.   [place] Pager [phone]  Office [phone]    [name] D Elonda Husky 11/13/2019, 9:27 AM

## 2019-11-13 NOTE — Progress Notes (Signed)
[facility] Progress Note  9 Days Post-Op  Subjective: CC-  Did not sleep as well last night. States that she has a lot on her mind. Colostomy leaked. They have decided to postpone CIR for now and reevalute next week. Feeling better this morning. Tolerating diet.  Drains in and working. Culture pending. On zosyn.  Objective: Vital signs in last 24 hours: Temp:  [97.5 F (36.4 C)-98.1 F (36.7 C)] 98 F (36.7 C) (08/13 0737) Pulse Rate:  [65-83] 74 (08/13 0737) Resp:  [13-20] 13 (08/13 0737) BP: (95-107)/(55-59) 107/55 (08/13 0737) SpO2:  [98 %-100 %] 100 % (08/13 0737) Weight:  [52.2 kg-52.3 kg] 52.3 kg (08/13 0528) Last BM Date: 11/12/19  Intake/Output from previous day: 08/12 0701 - 08/13 0700 In: 920 [P.O.:800; I.V.:70; IV Piggyback:50] Out: 405 [Urine:150; Drains:55; JJHER:740] Intake/Output this shift: No intake/output data recorded.  PE: Gen: Alert, NAD, pleasant Card: RRR Pulm: rate and effort normal Abd: Soft,ND, mild TTP around IR drain, IR drain with old blood,JP drain withold blood,ostomy viable with gas/liquid stool in pouch,midline incision with retention sutures in place - dressing just changed/ per patient it is stable from yesterday  Lab Results:  Recent Labs    11/12/19 0706 11/13/19 0521  WBC 6.7 5.1  HGB 7.9* 7.7*  HCT 24.8* 24.6*  PLT 260 257   BMET Recent Labs    11/12/19 0706 11/13/19 0521  NA 132* 129*  K 3.4* 3.8  CL 100 98  CO2 24 26  GLUCOSE 81 103*  BUN 11 11  CREATININE <0.30* <0.30*  CALCIUM 7.9* 7.7*   PT/INR No results for input(s): LABPROT, INR in the last 72 hours. CMP     Component Value Date/Time   NA 129 (L) 11/13/2019 0521   K 3.8 11/13/2019 0521   CL 98 11/13/2019 0521   CO2 26 11/13/2019 0521   GLUCOSE 103 (H) 11/13/2019 0521   BUN 11 11/13/2019 0521   CREATININE <0.30 (L) 11/13/2019 0521   CALCIUM 7.7 (L) 11/13/2019 0521   PROT 4.6 (L) 11/10/2019 0415   ALBUMIN 1.7 (L) 11/10/2019 0415    AST 11 (L) 11/10/2019 0415   ALT 23 11/10/2019 0415   ALKPHOS 112 11/10/2019 0415   BILITOT 0.6 11/10/2019 0415   GFRNONAA NOT CALCULATED 11/13/2019 0521   GFRAA NOT CALCULATED 11/13/2019 0521   Lipase     Component Value Date/Time   LIPASE 18 09/19/2019 1631       Studies/Results: CT IMAGE GUIDED DRAINAGE BY PERCUTANEOUS CATHETER  Result Date: 11/11/2019 CLINICAL DATA:  History of Crohn's disease with multiple abdominal surgeries and colectomy. Development postoperative fluid collection in the right abdomen suspicious for infected fluid collection. EXAM: CT GUIDED CATHETER DRAINAGE OF RIGHT-SIDED PERITONEAL ABSCESS ANESTHESIA/SEDATION: 1.5 mg IV Versed 75 mcg IV Fentanyl Total Moderate Sedation Time:  23 minutes The patient's level of consciousness and physiologic status were continuously monitored during the procedure by Radiology nursing. PROCEDURE: The procedure, risks, benefits, and alternatives were explained to the patient. Questions regarding the procedure were encouraged and answered. The patient understands and consents to the procedure. A time out was performed prior to initiating the procedure. CT was performed through the abdomen and pelvis in a supine position with the right side rolled up slightly. The right abdominal wall was prepped with chlorhexidine in a sterile fashion, and a sterile drape was applied covering the operative field. A sterile gown and sterile gloves were used for the procedure. Local anesthesia was provided with 1% Lidocaine.  Under CT guidance, an 18 gauge trocar needle was advanced to the level of a right-sided peritoneal fluid collection. After confirming needle tip position, fluid was aspirated. A guidewire was advanced through the needle and the needle removed. The percutaneous tract was dilated over the guidewire. A 12 French percutaneous drainage catheter was advanced over the wire and formed. Catheter position was confirmed by CT. The catheter was  connected to a suction bulb and secured at the skin with a Prolene retention suture and StatLock device. COMPLICATIONS: None FINDINGS: Aspiration at the level of right mid to lower abdominal fluid collection yielded foul-smelling, dark bloody fluid. A sample was sent for culture analysis. After placement of the drainage catheter, there is good return of fluid to suction bulb drainage. IMPRESSION: CT-guided percutaneous catheter drainage of right mid to lower abdominal fluid collection yielding foul-smelling, dark bloody fluid. A sample was sent for culture analysis. A 10 French drain was placed and attached to suction bulb drainage. Electronically Signed   By: Aletta Edouard M.D.   On: 11/11/2019 17:09    Anti-infectives: Anti-infectives (From admission, onward)   Start     Dose/Rate Route Frequency Ordered Stop   11/11/19 1100  piperacillin-tazobactam (ZOSYN) IVPB 3.375 g     Discontinue     3.375 g 12.5 mL/hr over 240 Minutes Intravenous Every 8 hours 11/11/19 0945     11/06/19 0600  ceFAZolin (ANCEF) IVPB 2g/100 mL premix  Status:  Discontinued        2 g 200 mL/hr over 30 Minutes Intravenous Every 8 hours 11/05/19 0914 11/08/19 1133   11/04/19 0600  cefTRIAXone (ROCEPHIN) 2 g in sodium chloride 0.9 % 100 mL IVPB  Status:  Discontinued        2 g 200 mL/hr over 30 Minutes Intravenous Every 24 hours 11/04/19 0500 11/05/19 0914   10/31/19 1700  vancomycin (VANCOCIN) IVPB 1000 mg/200 mL premix  Status:  Discontinued        1,000 mg 200 mL/hr over 60 Minutes Intravenous Every 12 hours 10/31/19 0742 11/01/19 1032   10/31/19 1000  vancomycin (VANCOREADY) IVPB 750 mg/150 mL  Status:  Discontinued        750 mg 150 mL/hr over 60 Minutes Intravenous Every 12 hours 10/31/19 0057 10/31/19 0742   10/31/19 0900  meropenem (MERREM) 2 g in sodium chloride 0.9 % 100 mL IVPB  Status:  Discontinued        2 g 200 mL/hr over 30 Minutes Intravenous Every 8 hours 10/31/19 0742 11/01/19 1032   10/31/19 0200   vancomycin (VANCOREADY) IVPB 1500 mg/300 mL        1,500 mg 150 mL/hr over 120 Minutes Intravenous  Once 10/31/19 0057 10/31/19 0700   10/31/19 0200  piperacillin-tazobactam (ZOSYN) IVPB 3.375 g  Status:  Discontinued        3.375 g 12.5 mL/hr over 240 Minutes Intravenous Every 8 hours 10/31/19 0057 10/31/19 0726   10/24/19 0600  piperacillin-tazobactam (ZOSYN) IVPB 3.375 g  Status:  Discontinued        3.375 g 12.5 mL/hr over 240 Minutes Intravenous Every 8 hours 10/23/19 2305 10/29/19 0932   10/24/19 0000  piperacillin-tazobactam (ZOSYN) IVPB 4.5 g  Status:  Discontinued        4.5 g 200 mL/hr over 30 Minutes Intravenous Every 6 hours 10/23/19 2257 10/23/19 2302   10/23/19 2315  piperacillin-tazobactam (ZOSYN) IVPB 3.375 g        3.375 g 100 mL/hr over 30 Minutes Intravenous STAT  10/23/19 2302 10/23/19 2347       Assessment/Plan 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 Henrene Pastor, per GI, started Solumedrol 7/27;per note from 8/9likely need 4 to 6 weeks of wound healing before we consider biologic therapy, decreased steroids 8/11  Sigmoid colon perforation-  - 10/24/19:partial colectomy and end colostomy by Dr. Kae Heller. POD#20 - 10/30/19: large mesenteric hematoma noted, hep gtt held - 11/04/19:fascial dehiscence with visible transverse colon: S/P exploratory laparotomy, colostomy revision, fascial closure, placement 4 retention sutures, placement of wound VAC, Dr. Maureen Chatters -  8/11: right abdominal abscess, s/p IR drain, culture pending, continue zosyn and follow culture - BID wet to dry dressing changes -continue JP drain - tolerating soft diet and ostomy functioning Protein calorie malnutrition - prealbumibn(8/2)14.2, off TPN Acute on chronic anemia- s/p 3u pRBC 7/30-8/1,Hgb7.7, stable FEN: Ensure,soft diet ID: [id number]/29;Ancef  8/6 for bacteremia (1/2 blood Cx 7/31 showed E.Coli)>>8/8, zosyn 8/11>day#3 VTE: SCD's, lovenox Foley:out 8/6    Plan: Encourage PO intake. Continue drains, IV zosyn, and follow culture. Continue therapies, CIR following.    LOS: 25 days    [facility] 11/13/2019, 8:38 AM Please see Amion for pager number during day hours 7:00am-4:30pm

## 2019-11-13 NOTE — Progress Notes (Signed)
Referring Physician(s): Rolm Bookbinder ([place])  Supervising Physician: Aletta Edouard  Patient Status:  [facility] - In-pt  Chief Complaint: "Drain pain"  Subjective:  History of diverticulitis s/p sigmoid colectomy in 2013 who presented with diffuse colitis with large perforation in the region of prior rectosigmoid anastomosis s/p exploratory laparotomy with Harthman's procedure in OR 10/24/2019 by Dr. Kae Heller; complicated by fascial dehiscence, mesenteric hematoma, pelvic abscess, and mucocutaneous separation of colostomy s/p exploratory laparotomy for colostomy revision with drain placement x1 and wound vac placement in OR 11/04/2019 by Dr. Georgette Dover; further complicated by development of intra-abdominal fluid collection s/p RLQ drain placement in IR 11/11/2019 by Dr. Kathlene Cote. Patient awake and alert sitting in bed watching TV. Accompanied by son at bedside. Complains of tenderness at RLQ drain site, as expected. RLQ drain site c/d/i.   Allergies: Patient has no known allergies.  Medications: Prior to Admission medications   Medication Sig Start Date End Date Taking? Authorizing Provider  Calcium Carb-Cholecalciferol (CALCIUM+D3 PO) Take 1 tablet by mouth daily.   Yes [provider]  Cholecalciferol (VITAMIN D-3) 125 MCG (5000 UT) TABS Take 5,000 Units by mouth daily.   Yes [provider]  loperamide (IMODIUM A-D) 2 MG tablet Take 1 tablet (2 mg total) by mouth 4 (four) times daily as needed for diarrhea or loose stools. 09/20/19  Yes Maudie Mercury, MD  Multiple Vitamin (MULTIVITAMIN WITH MINERALS) TABS tablet Take 1 tablet by mouth daily. Centrum Silver   Yes [provider]  potassium chloride (KLOR-CON) 10 MEQ tablet Take 2 tablets (20 mEq total) by mouth daily. Patient not taking: Reported on 10/18/2019 09/20/19   Maudie Mercury, MD     Vital Signs: BP (!) 107/55 (BP Location: Left Arm)    Pulse 74    Temp 98 F (36.7 C) (Oral)    Resp 13    Ht 5' 1"   (1.549 m)    Wt 115 lb 4.8 oz (52.3 kg)    LMP 12/15/2014    SpO2 100%    BMI 21.79 kg/m   Physical Exam Vitals and nursing note reviewed.  Constitutional:      General: She is not in acute distress.    Appearance: Normal appearance.  Pulmonary:     Effort: Pulmonary effort is normal. No respiratory distress.  Abdominal:     Comments: RLQ drain site with mild tenderness, no active bleeding, drainage, or erythema noted; approximately 20 cc bloody fluid in suction bulb; drain flushes/aspirates without resistance.  Skin:    General: Skin is warm and dry.  Neurological:     Mental Status: She is alert and oriented to person, place, and time.     Imaging: DG Abd 1 View  Result Date: 11/11/2019 CLINICAL DATA:  54 year old female with history of mesenteric dehiscence and hematoma, status post exploratory laparotomy and colostomy revision postoperative day 7. EXAM: ABDOMEN - 1 VIEW COMPARISON:  CT Abdomen and Pelvis 11/04/2019 and earlier. FINDINGS: Portable AP supine view at 0334 hours. Postoperative drain remains in place in the pelvis. Additional horizontal plastic tubes at the abdomen might be related to ventral abdominal closure and/or wound VAC. Nonspecific but nonobstructed appearing bowel gas pattern. No definite pneumoperitoneum on this supine view. Probably regressed pleural effusions from the prior CT. No acute osseous abnormality identified. IMPRESSION: 1. Nonspecific but nonobstructed bowel gas pattern. 2. Percutaneous drain remains in place to the pelvis. Electronically Signed   By: Genevie Ann M.D.   On: 11/11/2019 03:49   CT ABDOMEN  PELVIS W CONTRAST  Result Date: 11/11/2019 CLINICAL DATA:  Right lower quadrant abdominal pain. Known hematoma. EXAM: CT ABDOMEN AND PELVIS WITH CONTRAST TECHNIQUE: Multidetector CT imaging of the abdomen and pelvis was performed using the standard protocol following bolus administration of intravenous contrast. CONTRAST:  13m OMNIPAQUE IOHEXOL 300 MG/ML   SOLN COMPARISON:  CT scan 11/04/2019 FINDINGS: Lower chest: Persistent left-sided pleural effusion with overlying atelectasis. There is also a small amount of pericardial fluid which is stable. The right lung base is clear. Hepatobiliary: No hepatic lesions or intrahepatic biliary dilatation. The gallbladder appears normal. No common bile duct dilatation. Pancreas: No mass, inflammation or ductal dilatation. Spleen: Normal size. No focal lesions. Adrenals/Urinary Tract: The adrenal glands and kidneys are unremarkable. The bladder appears normal. Stomach/Bowel: The stomach, duodenum, small bowel and colon are unremarkable. No acute inflammatory process, mass lesion or obstructive findings. Left lower quadrant colostomy without complicating features. Stable appearing Hartmann's pouch. No leaking oral contrast is identified. Vascular/Lymphatic: Stable age advanced vascular calcifications. Stable small scattered mesenteric and retroperitoneal lymph nodes, likely reactive. Reproductive: The uterus and ovaries are unremarkable. Other: Stable drainage catheter in the pelvis with a small amount of surrounding fluid and a few dots of air, not unexpected. The large right-sided abdominal/pelvic hematoma is slightly smaller. It measures 13 x 11 x 7 cm and previously measured 14.5 x 12 x 8 cm. Some residual layering hematoma but it appears to be largely liquified now. It now contains a small amount of gas and there is also some rim enhancement. Findings worrisome for infected hematoma. Aspiration/drainage may be indicated. Musculoskeletal: No significant bony findings. IMPRESSION: 1. Stable drainage catheter in the pelvis with a small amount of surrounding fluid and a few dots of air, not unexpected. 2. Slight interval decrease in size of the large right-sided abdominal/pelvic hematoma. It does now contain a small amount of gas and there is also some rim enhancement. Findings worrisome for infected hematoma. Aspiration/drainage  may be indicated. 3. Stable left-sided pleural effusion with overlying atelectasis. 4. Stable Hartmann's pouch and left lower quadrant colostomy without complicating features. 5. Stable age advanced vascular calcifications. . Aortic Atherosclerosis (ICD10-I70.0). Electronically Signed   By: PMarijo SanesM.D.   On: 11/11/2019 08:00   CT IMAGE GUIDED DRAINAGE BY PERCUTANEOUS CATHETER  Result Date: 11/11/2019 CLINICAL DATA:  History of Crohn's disease with multiple abdominal surgeries and colectomy. Development postoperative fluid collection in the right abdomen suspicious for infected fluid collection. EXAM: CT GUIDED CATHETER DRAINAGE OF RIGHT-SIDED PERITONEAL ABSCESS ANESTHESIA/SEDATION: 1.5 mg IV Versed 75 mcg IV Fentanyl Total Moderate Sedation Time:  23 minutes The patient's level of consciousness and physiologic status were continuously monitored during the procedure by Radiology nursing. PROCEDURE: The procedure, risks, benefits, and alternatives were explained to the patient. Questions regarding the procedure were encouraged and answered. The patient understands and consents to the procedure. A time out was performed prior to initiating the procedure. CT was performed through the abdomen and pelvis in a supine position with the right side rolled up slightly. The right abdominal wall was prepped with chlorhexidine in a sterile fashion, and a sterile drape was applied covering the operative field. A sterile gown and sterile gloves were used for the procedure. Local anesthesia was provided with 1% Lidocaine. Under CT guidance, an 18 gauge trocar needle was advanced to the level of a right-sided peritoneal fluid collection. After confirming needle tip position, fluid was aspirated. A guidewire was advanced through the needle and the needle removed. The  percutaneous tract was dilated over the guidewire. A 12 French percutaneous drainage catheter was advanced over the wire and formed. Catheter position was  confirmed by CT. The catheter was connected to a suction bulb and secured at the skin with a Prolene retention suture and StatLock device. COMPLICATIONS: None FINDINGS: Aspiration at the level of right mid to lower abdominal fluid collection yielded foul-smelling, dark bloody fluid. A sample was sent for culture analysis. After placement of the drainage catheter, there is good return of fluid to suction bulb drainage. IMPRESSION: CT-guided percutaneous catheter drainage of right mid to lower abdominal fluid collection yielding foul-smelling, dark bloody fluid. A sample was sent for culture analysis. A 10 French drain was placed and attached to suction bulb drainage. Electronically Signed   By: Aletta Edouard M.D.   On: 11/11/2019 17:09    Labs:  CBC: Recent Labs    11/11/19 0010 11/11/19 0500 11/12/19 0706 11/13/19 0521  WBC 12.2* 13.9* 6.7 5.1  HGB 9.5* 8.8* 7.9* 7.7*  HCT 28.7* 27.9* 24.8* 24.6*  PLT 321 303 260 257    COAGS: Recent Labs    10/31/19 1721 11/06/19 0725  INR 1.2 1.1  APTT  --  32    BMP: Recent Labs    11/10/19 0415 11/11/19 0500 11/12/19 0706 11/13/19 0521  NA 133* 128* 132* 129*  K 3.4* 3.8 3.4* 3.8  CL 98 95* 100 98  CO2 27 22 24 26   GLUCOSE 85 74 81 103*  BUN 19 19 11 11   CALCIUM 8.0* 8.0* 7.9* 7.7*  CREATININE <0.30* 0.35* <0.30* <0.30*  GFRNONAA NOT CALCULATED >60 NOT CALCULATED NOT CALCULATED  GFRAA NOT CALCULATED >60 NOT CALCULATED NOT CALCULATED    LIVER FUNCTION TESTS: Recent Labs    11/02/19 0521 11/04/19 0136 11/05/19 0445 11/10/19 0415  BILITOT 0.7 0.7 0.7 0.6  AST 88* 48* 21 11*  ALT 135* 238* 127* 23  ALKPHOS 150* 159* 126 112  PROT 4.3* 4.4* 4.4* 4.6*  ALBUMIN 1.4* 1.6* 1.9* 1.7*    Assessment and Plan:  History of diverticulitis s/p sigmoid colectomy in 2013 who presented with diffuse colitis with large perforation in the region of prior rectosigmoid anastomosis s/p exploratory laparotomy with Harthman's procedure in OR  10/24/2019 by Dr. Kae Heller; complicated by fascial dehiscence, mesenteric hematoma, pelvic abscess, and mucocutaneous separation of colostomy s/p exploratory laparotomy for colostomy revision with drain placement x1 and wound vac placement in OR 11/04/2019 by Dr. Georgette Dover; further complicated by development of intra-abdominal fluid collection s/p RLQ drain placement in IR 11/11/2019 by Dr. Kathlene Cote. RLQ drain stable with approximately 20 cc bloody fluid in suction bulb (additional 30 cc output from drain in past 25 hours per chart). Continue current drain management- continue with Qshift flushes/monitor of output. Plan for repeat CT/possible drain injection when output <10 cc/day (assess for possible removal). Further plans per IMTS/CCS/GI- appreciate and agree with management. IR to follow.   Electronically Signed: Earley Abide, PA-C 11/13/2019, 10:32 AM   I spent a total of 25 Minutes at the the patient's bedside AND on the patient's hospital floor or unit, greater than 50% of which was counseling/coordinating care for intra-abdominal fluid collection s/p RLQ drain placement.

## 2019-11-13 NOTE — Progress Notes (Addendum)
Inpatient Rehabilitation Admissions Coordinator  I met at bedside with patient and her son, Kevin. Discussed the need to increase po intake and tolerance to sit up in chair for an hour at a time to prepare for more intensive therapies required at CIR. I will follow up on Monday with her progress.   , RN, MSN Rehab Admissions Coordinator [phone] 11/13/2019 2:12 PM  

## 2019-11-13 NOTE — Progress Notes (Addendum)
Daily Rounding Note  11/13/2019, 9:32 AM  LOS: 25 days     Attending physician's note   I have taken an interval history, reviewed the chart and examined the patient. I agree with the Advanced Practitioner's note, impression and recommendations.   Crohn's disease complicated by colonic perforation s/p ex lap with Hartman's procedure, postop course complicated with intra abdominal infected hematoma s/p right lower quadrant drain by IR  Culture from abscess positive for Klebsiella and blood culture positive for E. Coli Consider narrowing antibiotic coverage to IV Cipro and Flagyl based on culture sensitivity Continue IV Solu-Medrol 10 mg twice daily, can transition to prednisone 20 mg daily on discharge  Will not plan to start Biologics until infection is under control and surgical wound is completely healed  Continue supportive care  GI is available if have any questions or concerns.  Will not round on her this weekend.  Please call us if needed  K. Denzil Magnuson , MD [phone]     SUBJECTIVE:   Chief complaint: Complicated Crohn's disease, colonic perforation, status post surgery.  Right abdominal abscess    Abdominal pain improved but tends to bother her later in the afternoon.  No nausea or vomiting. Soft BPs.  Appetite and tray-based meal intake still poor but is consuming the additional Ensure..    Patient says she is coughing up a lot of gunk.  OBJECTIVE:         Vital signs in last 24 hours:    Temp:  [97.5 F (36.4 C)-98.1 F (36.7 C)] 98 F (36.7 C) (08/13 0737) Pulse Rate:  [65-83] 74 (08/13 0737) Resp:  [13-20] 13 (08/13 0737) BP: (95-107)/(55-59) 107/55 (08/13 0737) SpO2:  [98 %-100 %] 100 % (08/13 0737) Weight:  [52.2 kg-52.3 kg] 52.3 kg (08/13 0528) Last BM Date: 11/12/19 Filed Weights   11/12/19 0500 11/13/19 0414 11/13/19 0528  Weight: 50.6 kg 52.2 kg 52.3 kg   General: Looks a little bit  better today, sitting up in bed visiting with her son.  Comfortable, still pale and chronically ill-appearing Heart: RRR. Chest: Clear bilaterally.  Still with rhonchorous/phlegmatic cough.  No labored breathing Abdomen: Soft, nontender.  2 drains in place, the abscess drain contains purulent grayish colored fluid, the abdominal/surgical bed drain contains burgundy liquid.  Bowel sounds active Extremities: Minor lower extremity edema, overall improved. Neuro/Psych: Pleasant, calm, cooperative.  Fully alert and oriented.  No tremor.  Intake/Output from previous day: 08/12 0701 - 08/13 0700 In: 920 [P.O.:800; I.V.:70; IV Piggyback:50] Out: 405 [Urine:150; Drains:55; PPJKD:326]  Intake/Output this shift: No intake/output data recorded.  Lab Results: Recent Labs    11/11/19 0500 11/12/19 0706 11/13/19 0521  WBC 13.9* 6.7 5.1  HGB 8.8* 7.9* 7.7*  HCT 27.9* 24.8* 24.6*  PLT 303 260 257   BMET Recent Labs    11/11/19 0500 11/12/19 0706 11/13/19 0521  NA 128* 132* 129*  K 3.8 3.4* 3.8  CL 95* 100 98  CO2 22 24 26   GLUCOSE 74 81 103*  BUN 19 11 11   CREATININE 0.35* <0.30* <0.30*  CALCIUM 8.0* 7.9* 7.7*   LFT No results for input(s): PROT, ALBUMIN, AST, ALT, ALKPHOS, BILITOT, BILIDIR, IBILI in the last 72 hours. PT/INR No results for input(s): LABPROT, INR in the last 72 hours. Hepatitis Panel No results for input(s): HEPBSAG, HCVAB, HEPAIGM, HEPBIGM in the last 72 hours.  Studies/Results: CT IMAGE GUIDED DRAINAGE BY PERCUTANEOUS CATHETER  Result Date: 11/11/2019  CLINICAL DATA:  History of Crohn's disease with multiple abdominal surgeries and colectomy. Development postoperative fluid collection in the right abdomen suspicious for infected fluid collection. EXAM: CT GUIDED CATHETER DRAINAGE OF RIGHT-SIDED PERITONEAL ABSCESS ANESTHESIA/SEDATION: 1.5 mg IV Versed 75 mcg IV Fentanyl Total Moderate Sedation Time:  23 minutes The patient's level of consciousness and physiologic  status were continuously monitored during the procedure by Radiology nursing. PROCEDURE: The procedure, risks, benefits, and alternatives were explained to the patient. Questions regarding the procedure were encouraged and answered. The patient understands and consents to the procedure. A time out was performed prior to initiating the procedure. CT was performed through the abdomen and pelvis in a supine position with the right side rolled up slightly. The right abdominal wall was prepped with chlorhexidine in a sterile fashion, and a sterile drape was applied covering the operative field. A sterile gown and sterile gloves were used for the procedure. Local anesthesia was provided with 1% Lidocaine. Under CT guidance, an 18 gauge trocar needle was advanced to the level of a right-sided peritoneal fluid collection. After confirming needle tip position, fluid was aspirated. A guidewire was advanced through the needle and the needle removed. The percutaneous tract was dilated over the guidewire. A 12 French percutaneous drainage catheter was advanced over the wire and formed. Catheter position was confirmed by CT. The catheter was connected to a suction bulb and secured at the skin with a Prolene retention suture and StatLock device. COMPLICATIONS: None FINDINGS: Aspiration at the level of right mid to lower abdominal fluid collection yielded foul-smelling, dark bloody fluid. A sample was sent for culture analysis. After placement of the drainage catheter, there is good return of fluid to suction bulb drainage. IMPRESSION: CT-guided percutaneous catheter drainage of right mid to lower abdominal fluid collection yielding foul-smelling, dark bloody fluid. A sample was sent for culture analysis. A 10 French drain was placed and attached to suction bulb drainage. Electronically Signed   By: Aletta Edouard M.D.   On: 11/11/2019 17:09    Scheduled Meds: . (feeding supplement) PROSource Plus  30 mL Oral Daily  .  acetaminophen  650 mg Oral Q6H  . Chlorhexidine Gluconate Cloth  6 each Topical Daily  . enoxaparin (LOVENOX) injection  40 mg Subcutaneous Q24H  . escitalopram  10 mg Oral Daily  . feeding supplement (ENSURE ENLIVE)  237 mL Oral TID PC & HS  . lidocaine  1 patch Transdermal Q24H  . methocarbamol  1,000 mg Oral Q8H  . methylPREDNISolone (SOLU-MEDROL) injection  10 mg Intravenous Q12H  . pantoprazole  40 mg Oral Q0600  . sodium chloride flush  10-40 mL Intracatheter Q12H  . sodium chloride flush  3 mL Intravenous Once  . vitamin A  10,000 Units Oral Daily   Continuous Infusions: . sodium chloride Stopped (10/31/19 0453)  . lactated ringers 10 mL/hr at 11/04/19 1835  . piperacillin-tazobactam (ZOSYN)  IV 3.375 g (11/13/19 0926)   PRN Meds:.sodium chloride, guaiFENesin, HYDROmorphone (DILAUDID) injection, melatonin, ondansetron, sodium chloride flush, traMADol   ASSESMENT:   *   Complicated Crohn's dz, new dx.  Current mgt strategy is IV steroids. No biologics or immunomodulators until pt better healed from surgery.  Solumedrol decreased to 10 mg/bid on 8/11 and no role for immediate biologic/immunosuppresive therapies per Dr Vena Rua d/w Dr Teresa Pelton, IBD at [facility].        *   Sigmoid colon perf.  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   11/04/19 ex lap, colostomy revision, wound vac to address dehiscence/visible colon.   8/11 CTAP wc: slight decrease R abd/pelvic wall hematoma which contains small amount gas and rim enhancement, worrisome for infection. 8/11 perc drain placement to R abd abscess.  Growing Klebsiella  Zosyn, stopped on 8/9, restarted 8/11. WBCs improved/normal.   Currently w 2 drains: to /surgical bed/abdomen and into abscess   *   PCM.  On TNA thru 8/7, stopped due to rising LFTs which have subsequently resolved.  Albumin latest of 1.7 .  Eating 10 to 50% of trays.    *   Acute on  chronic anemia.  S/p 6 PRBCs 7/21 - 8/1.  Hgb 7.7.      PLAN   *   Continue current management.  Will return to see the patient next week.    Natasha Barrera  11/13/2019, 9:32 AM Phone [phone]

## 2019-11-14 DIAGNOSIS — K631 Perforation of intestine (nontraumatic): Secondary | ICD-10-CM | POA: Diagnosis not present

## 2019-11-14 LAB — BASIC METABOLIC PANEL
Anion gap: 7 (ref 5–15)
BUN: 7 mg/dL (ref 6–20)
CO2: 27 mmol/L (ref 22–32)
Calcium: 8.1 mg/dL — ABNORMAL LOW (ref 8.9–10.3)
Chloride: 98 mmol/L (ref 98–111)
Creatinine, Ser: <0.3 mg/dL — ABNORMAL LOW (ref 0.44–1.00)
Glucose, Bld: 86 mg/dL (ref 70–99)
Potassium: 3.7 mmol/L (ref 3.5–5.1)
Sodium: 132 mmol/L — ABNORMAL LOW (ref 135–145)

## 2019-11-14 LAB — CBC
HCT: 26.3 % — ABNORMAL LOW (ref 36.0–46.0)
Hemoglobin: 8.3 g/dL — ABNORMAL LOW (ref 12.0–15.0)
MCH: 31.6 pg (ref 26.0–34.0)
MCHC: 31.6 g/dL (ref 30.0–36.0)
MCV: 100 fL (ref 80.0–100.0)
Platelets: 279 10*3/uL (ref 150–400)
RBC: 2.63 MIL/uL — ABNORMAL LOW (ref 3.87–5.11)
RDW: 18.1 % — ABNORMAL HIGH (ref 11.5–15.5)
WBC: 6.1 10*3/uL (ref 4.0–10.5)
nRBC: 0 % (ref 0.0–0.2)

## 2019-11-14 MED ORDER — LACTATED RINGERS IV SOLN: INTRAVENOUS | Status: AC

## 2019-11-14 NOTE — Progress Notes (Signed)
   Subjective:  Natasha Barrera is a 54 y.o. with PMH of Crohn's disease, admit for bowel perf on hospital day 40  Natasha Barrera was examined at bedside this am. She reports she is doing well. Breathing appears improved and pain is well controlled. She mentions feeling good about seeing her children recently. Denies any other complaints.  Objective:  Vital signs in last 24 hours: Vitals:   11/14/19 0800 11/14/19 0900 11/14/19 1000 11/14/19 1135  BP: 114/67 98/61 (!) 98/58 (!) 94/55  Pulse: 93 73 66 73  Resp: 16   18  Temp: 98 F (36.7 C)   97.9 F (36.6 C)  TempSrc: Oral   Oral  SpO2: 100% 98% 98% 98%  Weight:      Height:       Gen: Chronically ill-appearing, NAD HEENT: NCAT head, hearing intact, EOMI, MMM Pulm: Breathing comfortably on room air, no cough, no distress  Abd: BS+, Distended, R sided wound drain w/ brown, foul smelling drainage Extm: ROM intact, No peripheral edema Skin: Dry, Warm, normal turgor Neuro: AAOx3  Assessment/Plan:  Principal Problem:   Colon perforation (HCC) Active Problems:   Crohn's disease of large intestine with other complication (HCC)   Malnutrition of moderate degree (HCC)   Pressure injury of skin   Peritoneal hematoma   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 with PMH of Crohn's disease,  admit for bowel perf due to Crohn's flair s/p colostomy formation complicated by hematoma and abscess formation.  Crohn's Flare/Sigmoid Colon perforation:S/Pcolectomy on 7/24.S/P exploratory [place] morning8/5.Ostomy bag has adecent amountbrown liquid in it today.Has tolerated decreases in Solumedrol. Surgery indicate no further intevention at this time. Currently awaiting CIR for discharge. Rehab administrator to follow up on progress on 11/16/19 -Continue advancing diet as able -Continue dressing changes on wound  -Per GIcontinue solumedrolto 44m IV q12 hours -Continue to monitor  for flare of diarrhea or hematochezia   Acute Hypoxia Able to wean down to room air during evaluation. Remained stable at 98 on room air -Resolving  HypovolemicHyponatremia: Likely due to poor oral intake.Improving with fluid resuscitation.Na 129->131. -Continue gentle IV fluids LR at 100 cc/hr for 10 hrs - Trend bmp  Right peritoneal hematoma, now infected Culture showed abundant gram negative rods. Initial sensitivity has resulted w/ Kleb pneumo but still holding for possible anaerobes.  -Continue Zosyn 3.375 g q8 (day 3) -follow up susceptibility for oral antibiotic change  Adjustment Disorder with Depressed Mood:Discussed thoughts about starting a medication yesterday and patient was agreeable.After careful selection Escitalopram hasbeen started. Willcontinue tomonitor response. -ContinueEscitalopram 10 mg  -Continue tomonitor for Serotonin Syndrome due to concurrent Tramadol use  Severe protein calorie malnutrition: Albuminwas 1.7on 8/10.Currently onfull liquids, will continue to advance diet as tolerated.Was able to eat some cheerios, only able to eat a little at a time before feeling full but still able to eat. -Continually encouraging eating and drinking as much as possible  DVT prophx: lovenoxsubcutaneous Diet:full liquids IVF:1L LR today at 100cc/hr  Prior to Admission Living Arrangement: Home Anticipated Discharge Location: CIR Barriers to Discharge: IV medication Dispo: Anticipated discharge in approximately 2-3 day(s).   LMosetta Anis MD 11/14/2019, 1:09 PM Pager: [phone] 5pm on weekdays and 1pm on weekends: On Call pager [phone]

## 2019-11-14 NOTE — Progress Notes (Addendum)
10 Days Post-Op   Subjective/Chief Complaint:  Pt with no acute changes  Tol PO     Objective: Vital signs in last 24 hours: Temp:  [97.7 F (36.5 C)-98.5 F (36.9 C)] 98 F (36.7 C) (08/14 0800) Pulse Rate:  [59-93] 66 (08/14 1000) Resp:  [14-20] 16 (08/14 0800) BP: (91-114)/(54-67) 98/58 (08/14 1000) SpO2:  [98 %-100 %] 98 % (08/14 1000) Weight:  [51.6 kg] 51.6 kg (08/14 0500) Last BM Date: 11/14/19  Intake/Output from previous day: 08/13 0701 - 08/14 0700 In: 1637.4 [P.O.:1300; I.V.:287.4; IV Piggyback:50] Out: 1100 [Urine:950; Stool:150] Intake/Output this shift: Total I/O In: 12 [Other:60] Out: -    PE: Gen: Alert, NAD, pleasant Card: RRR Pulm: rate and effort normal Abd: Soft,ND,mild TTP around IR drain,IR drain with old blood,JPdrain withold blood,ostomy viable with gas/liquid stool in pouch,midline incision with retention sutures in place - dressing just changed/ per patient it is stable from yesterday  Lab Results:  Recent Labs    11/13/19 0521 11/14/19 0705  WBC 5.1 6.1  HGB 7.7* 8.3*  HCT 24.6* 26.3*  PLT 257 279   BMET Recent Labs    11/13/19 0521 11/14/19 0705  NA 129* 132*  K 3.8 3.7  CL 98 98  CO2 26 27  GLUCOSE 103* 86  BUN 11 7  CREATININE <0.30* <0.30*  CALCIUM 7.7* 8.1*   PT/INR No results for input(s): LABPROT, INR in the last 72 hours. ABG No results for input(s): PHART, HCO3 in the last 72 hours.  Invalid input(s): PCO2, PO2  Studies/Results: No results found.  Anti-infectives: Anti-infectives (From admission, onward)   Start     Dose/Rate Route Frequency Ordered Stop   11/11/19 1100  piperacillin-tazobactam (ZOSYN) IVPB 3.375 g     Discontinue     3.375 g 12.5 mL/hr over 240 Minutes Intravenous Every 8 hours 11/11/19 0945     11/06/19 0600  ceFAZolin (ANCEF) IVPB 2g/100 mL premix  Status:  Discontinued        2 g 200 mL/hr over 30 Minutes Intravenous Every 8 hours 11/05/19 0914 11/08/19 1133   11/04/19  0600  cefTRIAXone (ROCEPHIN) 2 g in sodium chloride 0.9 % 100 mL IVPB  Status:  Discontinued        2 g 200 mL/hr over 30 Minutes Intravenous Every 24 hours 11/04/19 0500 11/05/19 0914   10/31/19 1700  vancomycin (VANCOCIN) IVPB 1000 mg/200 mL premix  Status:  Discontinued        1,000 mg 200 mL/hr over 60 Minutes Intravenous Every 12 hours 10/31/19 0742 11/01/19 1032   10/31/19 1000  vancomycin (VANCOREADY) IVPB 750 mg/150 mL  Status:  Discontinued        750 mg 150 mL/hr over 60 Minutes Intravenous Every 12 hours 10/31/19 0057 10/31/19 0742   10/31/19 0900  meropenem (MERREM) 2 g in sodium chloride 0.9 % 100 mL IVPB  Status:  Discontinued        2 g 200 mL/hr over 30 Minutes Intravenous Every 8 hours 10/31/19 0742 11/01/19 1032   10/31/19 0200  vancomycin (VANCOREADY) IVPB 1500 mg/300 mL        1,500 mg 150 mL/hr over 120 Minutes Intravenous  Once 10/31/19 0057 10/31/19 0700   10/31/19 0200  piperacillin-tazobactam (ZOSYN) IVPB 3.375 g  Status:  Discontinued        3.375 g 12.5 mL/hr over 240 Minutes Intravenous Every 8 hours 10/31/19 0057 10/31/19 0726   10/24/19 0600  piperacillin-tazobactam (ZOSYN) IVPB 3.375 g  Status:  Discontinued        3.375 g 12.5 mL/hr over 240 Minutes Intravenous Every 8 hours 10/23/19 2305 10/29/19 0932   10/24/19 0000  piperacillin-tazobactam (ZOSYN) IVPB 4.5 g  Status:  Discontinued        4.5 g 200 mL/hr over 30 Minutes Intravenous Every 6 hours 10/23/19 2257 10/23/19 2302   10/23/19 2315  piperacillin-tazobactam (ZOSYN) IVPB 3.375 g        3.375 g 100 mL/hr over 30 Minutes Intravenous STAT 10/23/19 2302 10/23/19 2347      Assessment/Plan: 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 Henrene Pastor, per GI, started Solumedrol 7/27;per note from 8/9likely need 4 to 6 weeks of wound healing before we consider biologic therapy,  decreased steroids 8/11  Sigmoid colon perforation-  - 10/24/19:partial colectomy and end colostomy by Dr. Kae Heller. POD#21 - 10/30/19: large mesenteric hematoma noted, hep gtt held - 11/04/19:fascial dehiscence with visible transverse colon: S/P exploratory laparotomy, colostomy revision, fascial closure, placement 4 retention sutures, placement of wound VAC, Dr. John Giovanni - 8/11: right abdominal abscess, s/p IR drain, culture  = Kleb, continue zosyn  - BID wet to dry dressing changes -continue JP drain -tolerating soft diet and ostomy functioning Protein calorie malnutrition - prealbumibn(8/2)14.2, off TPN Acute on chronic anemia- s/p 3u pRBC 7/30-8/1,Hgb8.3, stable FEN: Ensure,soft diet ID: [id number]/29;Ancef 8/6 for bacteremia (1/2 blood Cx 7/31 showed E.Coli)>>8/8, zosyn 8/11>day#4 VTE: SCD's, lovenox Foley:out 8/6   Plan: Encourage PO intake. Continue drains, IV zosyn, and follow culture. Continue therapies, CIR following.    LOS: 26 days    Natasha Barrera 11/14/2019

## 2019-11-15 NOTE — Progress Notes (Signed)
Subjective:  Interviewed Patient at bedside.  She reports doing better. She is in a much improved mood. Reports moving from bed to chair. Encouraged this and continuing to work with PT/OT. She reports her breathing is back to normal with only occasional mucus being coughed up.  Discussed CIR re-evaluating tomorrow for possible discharge. Discussed needing to switch to oral medications to be able to discharge and that should be soon.  She reports some concern for infection due to ostomy irritation and pain. Discussed since she is on IV antibiotics and wound care is monitoring and changing dressing twice daily it should be fine but we will monitor closely.   Objective:  Vital signs in last 24 hours: Vitals:   11/14/19 1800 11/14/19 2000 11/14/19 2322 11/15/19 0400  BP: 110/64 114/66 110/67 (!) 104/55  Pulse: 81 75 80 63  Resp:   (!) 22 18  Temp:  98.4 F (36.9 C) (!) 97.5 F (36.4 C) 97.8 F (36.6 C)  TempSrc:  Oral Oral Oral  SpO2: 97% 99% 98% 98%  Weight:      Height:       Physical Exam Vitals and nursing note reviewed.  Constitutional:      Appearance: Normal appearance. She is normal weight.  HENT:     Head: Normocephalic and atraumatic.  Cardiovascular:     Rate and Rhythm: Normal rate and regular rhythm.     Pulses: Normal pulses.          Radial pulses are 2+ on the right side and 2+ on the left side.       Dorsalis pedis pulses are 2+ on the right side and 2+ on the left side.     Heart sounds: Normal heart sounds, S1 normal and S2 normal. No murmur heard.   Pulmonary:     Effort: Pulmonary effort is normal. No respiratory distress.     Breath sounds: Normal breath sounds. No wheezing.  Abdominal:     General: There is no distension.     Tenderness: There is abdominal tenderness.  Musculoskeletal:     Right lower leg: No edema.     Left lower leg: No edema.  Skin:         Comments: Erythema reduced since Friday. Skin intact.  Neurological:     Mental  Status: She is alert.  Psychiatric:        Mood and Affect: Mood normal.        Behavior: Behavior normal.        Thought Content: Thought content normal.      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   Crohn's Flare/Sigmoid Colon perforation:S/Pcolectomy on 7/24.S/P exploratory [place] morning8/5.Ostomy bag has adecent amountbrown liquid and gas in it today.Reports some irritation and pain around Ostomy. Will continue to monitor. -Continue advancing diet as able -Continue dressing changes on wound  -Per GIcontinue solumedrolto 157mIV q12 hours -Continue to monitor for flare of diarrhea or hematochezia    Acute Hypoxia:Currently on room air. Reports minimal  mucus. Reports breathing much improved. -Continue holding lasix   HypovolemicHyponatremia (resolved):TodayNa is 132.Appears Euvolemic on exam. Encouraged eating and drinking as much as possible. -Hold IV fluids today -Continue holding lasix   Right peritoneal hematoma:Hemoglobintoday is 8.3.Drain still in place, being emptied less frequently.  -Continue Zosyn 3.375 g q8 (day 4) -Susceptibility resulted will switch to appropriate oral therapy once discharge immanent    Adjustment Disorder with Depressed Mood:Has had significant change in mood since starting medication. Mood much improved. Willcontinue tomonitor response. -ContinueEscitalopram 10 mg  -Continue tomonitor for  Serotonin Syndrome due to concurrent Tramadol use   Severe protein calorie malnutrition: Albuminwas 1.7on 8/10.Currently on soft diet, will continue to advance diet as tolerated.Encouraged eating. -Continually encouraging eating and drinking as much as possible   Left Heel: Erythema has decreased. No skin breakdown present. States she got up to the room chair the past couple days. Encouraged this and working with PT/OT as much as possible.  -Encouraged getting out of bed and sitting in chair -Encouraged working with PT/OT as much as possible -Daily checks   DVT prophx: SCDsandlovenoxsubcutaneous Diet:Soft diet DSK:AJGO   Prior to Admission Living Arrangement: Home Anticipated Discharge Location: CIR Barriers to Discharge: Complex Medical Issues Dispo: Anticipated discharge in approximately 2-3 day(s).   Briant Cedar, MD 11/15/2019, 6:19 AM Pager: [phone] After 5pm on weekdays and 1pm on weekends: On Call pager [phone]

## 2019-11-15 NOTE — Progress Notes (Signed)
Pt midline wound dressing changed. Scant amount of output from colostomy around the edge of the dressing and in wound, but does not appear to be seeping heavily in the wound as it had previously. Wound irrigated and re-dressed.   Justice Rocher, RN

## 2019-11-15 NOTE — Progress Notes (Signed)
11 Days Post-Op   Subjective/Chief Complaint: No acute changes   Objective: Vital signs in last 24 hours: Temp:  [97.5 F (36.4 C)-98.4 F (36.9 C)] 98 F (36.7 C) (08/15 0808) Pulse Rate:  [63-86] 70 (08/15 0808) Resp:  [18-22] 20 (08/15 0808) BP: (94-114)/(55-69) 103/66 (08/15 0808) SpO2:  [97 %-99 %] 99 % (08/15 0808) Last BM Date: 11/14/19  Intake/Output from previous day: 08/14 0701 - 08/15 0700 In: 300 [P.O.:240] Out: 560 [Urine:400; Drains:60; Stool:100] Intake/Output this shift: Total I/O In: -  Out: 550 [Urine:550]  Gen: Alert, NAD, pleasant Card: RRR Pulm: rate and effort normal Abd: Soft,ND,mild TTP around IR drain,IR drain with old blood,JPdrain withold blood,ostomy viable with gas/liquid stool in pouch,midline incisionwith retention sutures in place - wound improving  Lab Results:  Recent Labs    11/13/19 0521 11/14/19 0705  WBC 5.1 6.1  HGB 7.7* 8.3*  HCT 24.6* 26.3*  PLT 257 279   BMET Recent Labs    11/13/19 0521 11/14/19 0705  NA 129* 132*  K 3.8 3.7  CL 98 98  CO2 26 27  GLUCOSE 103* 86  BUN 11 7  CREATININE <0.30* <0.30*  CALCIUM 7.7* 8.1*   PT/INR No results for input(s): LABPROT, INR in the last 72 hours. ABG No results for input(s): PHART, HCO3 in the last 72 hours.  Invalid input(s): PCO2, PO2  Studies/Results: No results found.  Anti-infectives: Anti-infectives (From admission, onward)   Start     Dose/Rate Route Frequency Ordered Stop   11/11/19 1100  piperacillin-tazobactam (ZOSYN) IVPB 3.375 g     Discontinue     3.375 g 12.5 mL/hr over 240 Minutes Intravenous Every 8 hours 11/11/19 0945     11/06/19 0600  ceFAZolin (ANCEF) IVPB 2g/100 mL premix  Status:  Discontinued        2 g 200 mL/hr over 30 Minutes Intravenous Every 8 hours 11/05/19 0914 11/08/19 1133   11/04/19 0600  cefTRIAXone (ROCEPHIN) 2 g in sodium chloride 0.9 % 100 mL IVPB  Status:  Discontinued        2 g 200 mL/hr over 30 Minutes  Intravenous Every 24 hours 11/04/19 0500 11/05/19 0914   10/31/19 1700  vancomycin (VANCOCIN) IVPB 1000 mg/200 mL premix  Status:  Discontinued        1,000 mg 200 mL/hr over 60 Minutes Intravenous Every 12 hours 10/31/19 0742 11/01/19 1032   10/31/19 1000  vancomycin (VANCOREADY) IVPB 750 mg/150 mL  Status:  Discontinued        750 mg 150 mL/hr over 60 Minutes Intravenous Every 12 hours 10/31/19 0057 10/31/19 0742   10/31/19 0900  meropenem (MERREM) 2 g in sodium chloride 0.9 % 100 mL IVPB  Status:  Discontinued        2 g 200 mL/hr over 30 Minutes Intravenous Every 8 hours 10/31/19 0742 11/01/19 1032   10/31/19 0200  vancomycin (VANCOREADY) IVPB 1500 mg/300 mL        1,500 mg 150 mL/hr over 120 Minutes Intravenous  Once 10/31/19 0057 10/31/19 0700   10/31/19 0200  piperacillin-tazobactam (ZOSYN) IVPB 3.375 g  Status:  Discontinued        3.375 g 12.5 mL/hr over 240 Minutes Intravenous Every 8 hours 10/31/19 0057 10/31/19 0726   10/24/19 0600  piperacillin-tazobactam (ZOSYN) IVPB 3.375 g  Status:  Discontinued        3.375 g 12.5 mL/hr over 240 Minutes Intravenous Every 8 hours 10/23/19 2305 10/29/19 0932   10/24/19 0000  piperacillin-tazobactam (ZOSYN) IVPB 4.5 g  Status:  Discontinued        4.5 g 200 mL/hr over 30 Minutes Intravenous Every 6 hours 10/23/19 2257 10/23/19 2302   10/23/19 2315  piperacillin-tazobactam (ZOSYN) IVPB 3.375 g        3.375 g 100 mL/hr over 30 Minutes Intravenous STAT 10/23/19 2302 10/23/19 2347      Assessment/Plan: 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 Henrene Pastor, per GI, started Solumedrol 7/27;per note from 8/9likely need 4 to 6 weeks of wound healing before we consider biologic therapy, decreased steroids 8/11  Sigmoid colon perforation-  - 10/24/19:partial colectomy and end colostomy by Dr. Kae Heller.  -  10/30/19: large mesenteric hematoma noted, hep gtt held - 11/04/19:fascial dehiscence with visible transverse colon: S/P exploratory laparotomy, colostomy revision, fascial closure, placement 4 retention sutures, placement of wound VAC, Dr. Georgette Dover - 8/11: right abdominal abscess, s/p IR drain, culture pending, continue zosyn and follow culture - BID wet to dry dressing changes -continue JP drain -tolerating soft diet and ostomy functioning Protein calorie malnutrition - prealbumin(8/2)14.2, off TPN Acute on chronic anemia- s/p 3u pRBC 7/30-8/1,Hgb7.7, stable FEN: Ensure,soft diet ID: Zosyn7/23-7/29;Ancef 8/6 for bacteremia (1/2 blood Cx 7/31 showed E.Coli)>>8/8, zosyn 8/11>day#3 VTE: SCD's, lovenox Foley:out 8/6   Plan: Encourage PO intake. Continue drains, IV zosyn, and follow culture. Continue therapies, CIR following.   LOS: 27 days    Maia Petties 11/15/2019

## 2019-11-15 NOTE — Progress Notes (Addendum)
Pt colostomy and midline dressing changed. Stool from colostomy noted in wound from broken seal. Wound washed out with NS. Breakdown ( red and yellow slough) noted around retention sutures. There was also stool noted to be leaking around right retentions sutures.

## 2019-11-16 DIAGNOSIS — D62 Acute posthemorrhagic anemia: Secondary | ICD-10-CM

## 2019-11-16 DIAGNOSIS — F4321 Adjustment disorder with depressed mood: Secondary | ICD-10-CM

## 2019-11-16 DIAGNOSIS — K50814 Crohn's disease of both small and large intestine with abscess: Secondary | ICD-10-CM

## 2019-11-16 LAB — MAGNESIUM: Magnesium: 2 mg/dL (ref 1.7–2.4)

## 2019-11-16 LAB — CBC
HCT: 26 % — ABNORMAL LOW (ref 36.0–46.0)
Hemoglobin: 8.3 g/dL — ABNORMAL LOW (ref 12.0–15.0)
MCH: 32.2 pg (ref 26.0–34.0)
MCHC: 31.9 g/dL (ref 30.0–36.0)
MCV: 100.8 fL — ABNORMAL HIGH (ref 80.0–100.0)
Platelets: 354 10*3/uL (ref 150–400)
RBC: 2.58 MIL/uL — ABNORMAL LOW (ref 3.87–5.11)
RDW: 18.5 % — ABNORMAL HIGH (ref 11.5–15.5)
WBC: 5 10*3/uL (ref 4.0–10.5)
nRBC: 0 % (ref 0.0–0.2)

## 2019-11-16 LAB — BASIC METABOLIC PANEL
Anion gap: 7 (ref 5–15)
BUN: 5 mg/dL — ABNORMAL LOW (ref 6–20)
CO2: 26 mmol/L (ref 22–32)
Calcium: 7.8 mg/dL — ABNORMAL LOW (ref 8.9–10.3)
Chloride: 101 mmol/L (ref 98–111)
Creatinine, Ser: <0.3 mg/dL — ABNORMAL LOW (ref 0.44–1.00)
Glucose, Bld: 82 mg/dL (ref 70–99)
Potassium: 3.4 mmol/L — ABNORMAL LOW (ref 3.5–5.1)
Sodium: 134 mmol/L — ABNORMAL LOW (ref 135–145)

## 2019-11-16 LAB — PHOSPHORUS: Phosphorus: 4.6 mg/dL (ref 2.5–4.6)

## 2019-11-16 MED ORDER — GABAPENTIN 100 MG PO CAPS
100.0000 mg | ORAL_CAPSULE | Freq: Two times a day (BID) | ORAL | Status: DC
  Administered 2019-11-16 (×2): 100 mg via ORAL
  Filled 2019-11-16 (×2): qty 1

## 2019-11-16 MED ORDER — PREDNISONE 20 MG PO TABS
20.0000 mg | ORAL_TABLET | Freq: Every day | ORAL | Status: DC
  Administered 2019-11-17 – 2019-11-23 (×6): 20 mg via ORAL
  Filled 2019-11-16 (×6): qty 1

## 2019-11-16 NOTE — Progress Notes (Signed)
Nutrition Follow-up  DOCUMENTATION CODES:   Non-severe (moderate) malnutrition in context of chronic illness  INTERVENTION:  Continue Ensure Enlive po QID, each supplement provides 350 kcal and 20 grams of protein  Continue 30 ml Prosource po once daily, each supplement provides 100 kcal and 15 grams of protein.   Encourage adequate PO intake.   NUTRITION DIAGNOSIS:   Moderate Malnutrition related to chronic illness (crohn's disease) as evidenced by moderate muscle depletion, moderate fat depletion; ongoing  GOAL:   Patient will meet greater than or equal to 90% of their needs; progressing  MONITOR:   PO intake, Supplement acceptance, Skin, Weight trends, Labs, I & O's  REASON FOR ASSESSMENT:   Malnutrition Screening Tool, Consult Assessment of nutrition requirement/status  ASSESSMENT:   [name] is a 54 year old female with past medical history of HTN, diverticulitis s/p sigmoid colectomy in 2013, ventral hernia (repaired), and prior episode of colitis, and a recent hospitalization for diarrhea, hypotension and electrolyte derangements secondary to colitis who returned nine days after leaving AMA found to have continued electrolyte derangements with new onset peripheral edema.   Pt with Inflammatory Colitis secondary to Crohn's Disease  7/24 s/p ex lap, LOA, segmental colectomy with end colostomy due to large perforation in the prior rectosigmoid anastomosis 7/25 TPN started due to anticipated prolonged ileus post op 7/31 pt found to have large mesenteric hematoma (11.3 cm) within R retroperitoneum with innumerable bil renal infarcts - no intervention at this time; pt NPO 8/4Exploratory laparotomy for dehiscence, colostomy revision, placement of wound VAC  8/7 Diet advanced to soft diet, TPN weaned and discontinued  Meal completion has been 50-75%. Intake has been gradually improving. Pt currently has Ensure and Prosource plus ordered and has been consuming  them. Pt encouraged to eat her food at meals and to drink her supplements. RD to continue with current orders.   Labs and medications reviewed.   Diet Order:   Diet Order            DIET SOFT Room service appropriate? Yes; Fluid consistency: Thin  Diet effective now                 EDUCATION NEEDS:   No education needs have been identified at this time  Skin:  Skin Assessment: Skin Integrity Issues: Skin Integrity Issues:: Stage II Stage I: L buttocks Stage II: R buttocks Wound Vac: N/A Incisions: abdomen  Last BM:  8/16 colostomy 100 ml output  Height:   Ht Readings from Last 1 Encounters:  11/04/19 5' 1"  (1.549 m)    Weight:   Wt Readings from Last 1 Encounters:  11/16/19 51.6 kg    Ideal Body Weight:  54.5 kg  BMI:  Body mass index is 21.49 kg/m.  Estimated Nutritional Needs:   Kcal:  1700-1900  Protein:  90-105 grams  Fluid:  > 1.7 L  Natasha Parker, MS, RD, LDN RD pager number/after hours weekend pager number on Amion.

## 2019-11-16 NOTE — Consult Note (Signed)
[facility] Nurse ostomy follow up Stoma type/location: LUQ colostomy Stomal assessment/size: 1 1/4" stoma with MC separation noted.  Peristomal assessment: intact Treatment options for stomal/peristomal skin: barrier ring and 1 piece convex pouch Output soft brown stool Ostomy pouching: 1pc. Convex with barrier ring Education provided: patient in pain.  NO one else at bedside.  Patient states " I am tired of all this"  Enrolled patient in [place] Start Discharge program: Yes Will follow.  Domenic Moras MSN, RN, FNP-BC [facility]ON Wound, Ostomy, Continence Nurse Pager [phone]

## 2019-11-16 NOTE — Progress Notes (Addendum)
Daily Rounding Note  11/16/2019, 9:50 AM  LOS: 28 days   SUBJECTIVE:   Chief complaint:  Crohns colitis, colon perf, intra abdominal hematoma   Pain overall stable in midline.  Current pain mgt w Tramadol.     Decreasing use of this in last few days.  Appetite remains depressed, taking most/all of Ensure and eating 50 to 75% of trays.     OBJECTIVE:         Vital signs in last 24 hours:    Temp:  [98.1 F (36.7 C)-98.5 F (36.9 C)] 98.4 F (36.9 C) (08/16 0746) Pulse Rate:  [67-94] 94 (08/16 0746) Resp:  [18-20] 20 (08/16 0050) BP: (102-111)/(57-85) 109/64 (08/16 0746) SpO2:  [94 %-99 %] 98 % (08/16 0746) Weight:  [51.6 kg] 51.6 kg (08/16 0500) Last BM Date: 11/16/19 (ostomy with current output) Filed Weights   11/13/19 0528 11/14/19 0500 11/16/19 0500  Weight: 52.3 kg 51.6 kg 51.6 kg   General: still looks weak and pale but overall better.  comfortable   Heart: RRR Chest: clear bil.  No dyspnea, previous cough absent Abdomen: ND.  Soft, tender over midline incision but no G/R.  BS present.  Bandages not removed.  Medium brown stool in ostomy.  Dark, burgundy blood in abscess/hematoma drain, purulent liquid in surgical bed JP drain.  Overall foul, feculent odor emanating from abdomen. Note wound photo in surgeries note today, looks like some fecal liquid is oozing from beneath medial ostomy appliance bordering on L lateral aspect of open wound.   Extremities: slight LE edema, overall better Neuro/Psych:  Alert, speech fluid,  No weakness or tremors.  Affect is more engaged, less flat.    Intake/Output from previous day: 08/15 0701 - 08/16 0700 In: 360 [P.O.:360] Out: 1825 [Urine:1050; Drains:425; Stool:350]  Intake/Output this shift: Total I/O In: 240 [P.O.:240] Out: 200 [Stool:200]  Lab Results: Recent Labs    11/14/19 0705 11/16/19 0447  WBC 6.1 5.0  HGB 8.3* 8.3*  HCT 26.3* 26.0*  PLT 279 354    BMET Recent Labs    11/14/19 0705 11/16/19 0638  NA 132* 134*  K 3.7 3.4*  CL 98 101  CO2 27 26  GLUCOSE 86 82  BUN 7 5*  CREATININE <0.30* <0.30*  CALCIUM 8.1* 7.8*   Scheduled Meds:  (feeding supplement) PROSource Plus  30 mL Oral Daily   acetaminophen  650 mg Oral Q6H   Chlorhexidine Gluconate Cloth  6 each Topical Daily   enoxaparin (LOVENOX) injection  40 mg Subcutaneous Q24H   escitalopram  10 mg Oral Daily   feeding supplement (ENSURE ENLIVE)  237 mL Oral TID PC & HS   gabapentin  100 mg Oral BID   lidocaine  1 patch Transdermal Q24H   methocarbamol  1,000 mg Oral Q8H   methylPREDNISolone (SOLU-MEDROL) injection  10 mg Intravenous Q12H   pantoprazole  40 mg Oral Q0600   sodium chloride flush  10-40 mL Intracatheter Q12H   sodium chloride flush  3 mL Intravenous Once   vitamin A  10,000 Units Oral Daily   Continuous Infusions:  sodium chloride Stopped (10/31/19 0453)   lactated ringers 10 mL/hr at 11/04/19 1835   piperacillin-tazobactam (ZOSYN)  IV 3.375 g (11/16/19 0753)   PRN Meds:.sodium chloride, guaiFENesin, HYDROmorphone (DILAUDID) injection, melatonin, ondansetron, sodium chloride flush, traMADol Studies/Results: No results found.  ASSESMENT:   *Complicated Crohn's dz, new dx. Current mgt strategy is IV steroids. Solumedrol  decreased to 10 mg/bid on 8/11 and no role for immediate biologic/immunosuppresive therapies per Dr Vena Rua d/w Dr Teresa Pelton, IBD at [facility]     * Sigmoid colon perf. 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 Mesenteric hematoma 7/30 after starting Heparin for infrarenal aortic thrombus.  11/04/19 ex lap, colostomy revision, wound vac to address dehiscence/visible colon.  8/11 perc drain placement to R abd abscess.  Growing Klebsiella. Zosyn, stopped on 8/9, restarted 8/11. WBCs improved/normal.   Currently w 2 drains: to /surgical bed/abdomen and into abscess   * PCM. On TNA thru 8/7, stopped  due to rising LFTs which have subsequently resolved. Albumin latest of 1.7 . Eating 10 to 50% of trays.   * Acute on chronic anemia. S/p 6 PRBCs 7/21 - 8/1.  Hgb 7.7.      PLAN   *   Switch to Prednisone 20 mg/day, no plans to taper  *   fup w Dr Tarri Glenn or GI APP in weeks after discharge.   *   GI signing off, available to return if needed.       Azucena Freed  11/16/2019, 9:50 AM Phone [phone]

## 2019-11-16 NOTE — Progress Notes (Signed)
Referring Physician(s): Dr. Donne Hazel  Supervising Physician: Jacqulynn Cadet  Patient Status:  [facility] - In-pt  Chief Complaint: Colon perforation  Subjective:  Patient sitting up in chair.  Asking to get back in bed.  Worked with PT this AM, now complaining of pain.  RLQ drain in place.   Allergies: Patient has no known allergies.  Medications: Prior to Admission medications   Medication Sig Start Date End Date Taking? Authorizing Provider  Calcium Carb-Cholecalciferol (CALCIUM+D3 PO) Take 1 tablet by mouth daily.   Yes [provider]  Cholecalciferol (VITAMIN D-3) 125 MCG (5000 UT) TABS Take 5,000 Units by mouth daily.   Yes [provider]  loperamide (IMODIUM A-D) 2 MG tablet Take 1 tablet (2 mg total) by mouth 4 (four) times daily as needed for diarrhea or loose stools. 09/20/19  Yes Maudie Mercury, MD  Multiple Vitamin (MULTIVITAMIN WITH MINERALS) TABS tablet Take 1 tablet by mouth daily. Centrum Silver   Yes [provider]     Vital Signs: BP 109/64 (BP Location: Left Arm)   Pulse 94   Temp 98.4 F (36.9 C)   Resp 20   Ht 5' 1"  (1.549 m)   Wt 113 lb 12.1 oz (51.6 kg)   LMP 12/15/2014   SpO2 98%   BMI 21.49 kg/m   Physical Exam Vitals and nursing note reviewed.   NAD, alert Abdomen: RLQ drain in place. Dark, bloody output. Midline wound with double red rubber catheters in place. Ostomy in place, difficult seal, small amount of leakage.   Imaging: No results found.  Labs:  CBC: Recent Labs    11/12/19 0706 11/13/19 0521 11/14/19 0705 11/16/19 0447  WBC 6.7 5.1 6.1 5.0  HGB 7.9* 7.7* 8.3* 8.3*  HCT 24.8* 24.6* 26.3* 26.0*  PLT 260 257 279 354    COAGS: Recent Labs    10/31/19 1721 11/06/19 0725  INR 1.2 1.1  APTT  --  32    BMP: Recent Labs    11/12/19 0706 11/13/19 0521 11/14/19 0705 11/16/19 0638  NA 132* 129* 132* 134*  K 3.4* 3.8 3.7 3.4*  CL 100 98 98 101  CO2 24 26 27 26   GLUCOSE 81 103*  86 82  BUN 11 11 7  5*  CALCIUM 7.9* 7.7* 8.1* 7.8*  CREATININE <0.30* <0.30* <0.30* <0.30*  GFRNONAA NOT CALCULATED NOT CALCULATED NOT CALCULATED NOT CALCULATED  GFRAA NOT CALCULATED NOT CALCULATED NOT CALCULATED NOT CALCULATED    LIVER FUNCTION TESTS: Recent Labs    11/02/19 0521 11/04/19 0136 11/05/19 0445 11/10/19 0415  BILITOT 0.7 0.7 0.7 0.6  AST 88* 48* 21 11*  ALT 135* 238* 127* 23  ALKPHOS 150* 159* 126 112  PROT 4.3* 4.4* 4.4* 4.6*  ALBUMIN 1.4* 1.6* 1.9* 1.7*    Assessment and Plan: History of diverticulitis s/p sigmoid colectomy in 2013 who presented with diffuse colitis with large perforation in the region of prior rectosigmoid anastomosis s/p exploratory laparotomy with Harthman's procedure in OR 10/24/2019 by Dr. Kae Heller; complicated by fascial dehiscence, mesenteric hematoma, pelvic abscess, and mucocutaneous separation of colostomy s/p exploratory laparotomy for colostomy revision with drain placement x1 and wound vac placement in OR 11/04/2019 by Dr. Georgette Dover; further complicated by development of intra-abdominal fluid collection s/p RLQ drain placement in IR 11/11/2019 by Dr. Kathlene Cote Patient sitting up in chair, appearing uncomfortable. Asking to go back to bed.  Abdomen assessed.  Appears stable. RLQ drain in place with output consistent with liquefied hematoma.  Foul-smell around  drain/wounds, however ostomy not with complete seal.  Output documentation shows small increments of output, however bulb is almost completely full during my visit today-- after working with PT(?). Making slow progress. Difficulty with mobilizing and pain control per patient.  Continue current drain care for now.     Electronically Signed: Docia Barrier, PA 11/16/2019, 11:11 AM   I spent a total of 15 Minutes at the the patient's bedside AND on the patient's hospital floor or unit, greater than 50% of which was counseling/coordinating care for colon perforation.

## 2019-11-16 NOTE — Progress Notes (Signed)
Physical Therapy Treatment Patient Details Name: Natasha Barrera MRN: [id number] DOB: Nov 20, 1965 Today's Date: 11/16/2019    History of Present Illness Natasha Barrera is a 54 year old female with PMH significant for HTN and diverticulitis (s/p sigmoid colectomy 2015) who originally presented with complaints of LE edema with later reports of abdominal pain and decreased appetite.  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 s/p exp laparatomy for dehiscence, colostomy revision, and wound vac placement 8/5.    PT Comments    Pt in recliner on arrival. She has been OOB x 1 hour. Agreeable to LE exercises. PT to continue per POC.    Follow Up Recommendations  CIR     Equipment Recommendations  Rolling walker with 5" wheels;3in1 (PT)    Recommendations for Other Services       Precautions / Restrictions Precautions Precautions: Fall Precaution Comments: JP drain R flank, colostomy, abdominal binder when OOB Restrictions Weight Bearing Restrictions: No    Mobility  Bed Mobility                  Transfers                    Ambulation/Gait                 Stairs             Wheelchair Mobility    Modified Rankin (Stroke Patients Only)       Balance                                            Cognition Arousal/Alertness: Awake/alert Behavior During Therapy: Anxious Overall Cognitive Status: Impaired/Different from baseline Area of Impairment: Problem solving;Awareness;Safety/judgement                         Safety/Judgement: Decreased awareness of deficits;Decreased awareness of safety Awareness: Intellectual Problem Solving: Requires verbal cues        Exercises General Exercises -  Lower Extremity Ankle Circles/Pumps: AROM;Both;20 reps Quad Sets: AROM;Both;10 reps Gluteal Sets: AROM;Both;10 reps Heel Slides: AAROM;Right;Left;10 reps Hip ABduction/ADduction: AROM;Both;10 reps    General Comments        Pertinent Vitals/Pain Pain Assessment: Faces Faces Pain Scale: Hurts even more Pain Location: abdomen Pain Descriptors / Indicators: Operative site guarding;Discomfort;Grimacing;Guarding Pain Intervention(s): Limited activity within patient's tolerance;Repositioned;Monitored during session    Home Living                      Prior Function            PT Goals (current goals can now be found in the care plan section) Acute Rehab PT Goals Patient Stated Goal: decreased pain Progress towards PT goals: Progressing toward goals    Frequency    Min 3X/week      PT Plan Current plan remains appropriate    Co-evaluation              AM-PAC PT "6 Clicks" Mobility   Outcome Measure  Help needed turning from your back to your side while in a flat bed without using bedrails?: A Little Help needed moving  from lying on your back to sitting on the side of a flat bed without using bedrails?: A Little Help needed moving to and from a bed to a chair (including a wheelchair)?: A Lot Help needed standing up from a chair using your arms (e.g., wheelchair or bedside chair)?: A Lot Help needed to walk in hospital room?: Total Help needed climbing 3-5 steps with a railing? : Total 6 Click Score: 12    End of Session   Activity Tolerance: Patient limited by pain Patient left: in chair;with call bell/phone within reach;with chair alarm set Nurse Communication: Mobility status PT Visit Diagnosis: Other abnormalities of gait and mobility (R26.89);Muscle weakness (generalized) (M62.81)     Time: 7618-4859 PT Time Calculation (min) (ACUTE ONLY): 16 min  Charges:  $Therapeutic Exercise: 8-22 mins                     Lorrin Goodell, PT  Office #  [phone] Pager [phone]    [name] 11/16/2019, 11:07 AM

## 2019-11-16 NOTE — Progress Notes (Signed)
Patient resting comfortably on room air. No respiratory distress noted. BIPAP not needed at this time. RT will monitor as needed. ?

## 2019-11-16 NOTE — Progress Notes (Signed)
Inpatient Rehabilitation Admissions Coordinator  I met with patient at bedside to discuss with her barriers to admit to [facility]. She is unable to tolerate the intensity of CIR admit, so I am not pursuing admit at this time. I am recommending SNF unless she demonstrates more participation with therapy.  Danne Baxter, RN, MSN Rehab Admissions Coordinator [phone] 11/16/2019 1:18 PM

## 2019-11-16 NOTE — Progress Notes (Signed)
Subjective:  Interviewed patient at bedside.  States shes doing well, just tired. Discussed need to continue to eat. Says she is still having altered taste with food. Plans to get up in chair today, wasn't able to yesterday.   Continuing to get decent output in ostomy. Soreness around ostomy site in afternoon, but okay this AM.  Objective:  Vital signs in last 24 hours: Vitals:   11/15/19 2022 11/16/19 0050 11/16/19 0500 11/16/19 0516  BP: (!) 107/57 111/61  102/61  Pulse: 74 71  76  Resp: 20 20    Temp: 98.3 F (36.8 C) 98.2 F (36.8 C)  98.5 F (36.9 C)  TempSrc: Oral Oral  Oral  SpO2: 94% 97%  99%  Weight:   51.6 kg   Height:       Physical Exam Vitals and nursing note reviewed.  Constitutional:      General: She is not in acute distress.    Appearance: Normal appearance. She is not ill-appearing or toxic-appearing.  HENT:     Head: Normocephalic and atraumatic.  Cardiovascular:     Rate and Rhythm: Normal rate and regular rhythm.     Pulses: Normal pulses.          Radial pulses are 2+ on the right side and 2+ on the left side.       Dorsalis pedis pulses are 2+ on the right side and 2+ on the left side.     Heart sounds: Normal heart sounds, S1 normal and S2 normal. No murmur heard.   Pulmonary:     Effort: Pulmonary effort is normal. No respiratory distress.     Breath sounds: Normal breath sounds. No wheezing.  Musculoskeletal:     Right lower leg: No edema.     Left lower leg: No edema.  Neurological:     Mental Status: She is alert. Mental status is at baseline.  Psychiatric:        Mood and Affect: Mood normal.        Behavior: Behavior normal.        Thought Content: Thought content normal.      Assessment/Plan:  Principal Problem:   Colon perforation (HCC) Active Problems:   Crohn's disease of large intestine with other complication (HCC)   Malnutrition of moderate degree (HCC)   Pressure injury of skin   Peritoneal hematoma   Renal  infarction ([place])   Acute blood loss anemia   E coli bacteremia   Hospital day62fr [id number] old femaleadmitted with newdiagnosis of severeCrohn's disease, hospitalizationcomplicated by a bowel perforation on 7/24, s/purgent partial colectomy and colostomy, S/P exploratory lap and reattachment of ostomy 8/4.   Crohn's Flare/Sigmoid Colon perforation:S/Pcolectomy on 7/24.S/P exploratory [place] morning8/5.Ostomy bag has adecent amountbrown liquid and gas in it today.Reports some irritation and pain around Ostomy. Will continue to monitor. -Continue advancing diet as able -Continue dressing changes on wound  -Per GIcontinuesolumedrolto 151mIV q12 hours -Continue to monitor for flare of diarrhea or hematochezia    Acute Hypoxia:Currently on room air.Reportsminimal mucus.Reports breathing much improved. -Hold lasix   HypovolemicHyponatremia(resolved):TodayNa is134.Appears Euvolemic on exam. Encouraged eating and drinking as much as possible. -Hold IV fluids today -Hold lasix   Right peritoneal hematoma:Hemoglobintoday is 8.3.Drain still in place, being emptied less frequently. Yesterday Pseudomonas was found in the culture. Will wait for susceptibility to result and consult ID for proper antibiotic regiment. -ContinueZosyn 3.375 g q8 (day 5) -Appreciate ID recommendations    Adjustment Disorder with Depressed Mood:Has  had significant change in mood since starting medication. Mood much improved. Willcontinue tomonitor response. -ContinueEscitalopram 10 mg  -Continue tomonitor for Serotonin Syndrome due to concurrent Tramadol use   Severe protein calorie malnutrition:Albuminwas 1.7on8/10.Currently on soft diet, will continue to advance diet as tolerated.Encouraged eating. -Continually encouragingeating and drinking as much as possible   Left Heel:No skin breakdown present. States she got up to the room chair the past  couple days. Encouraged this and working with PT/OT as much as possible.  -Encouraged getting out of bed and sitting in chair -Encouraged working with PT/OT as much as possible -Daily checks   DVT prophx: SCDsandlovenoxsubcutaneous Diet:Soft diet OYO:OJZB   Prior to Admission Living Arrangement: Home Anticipated Discharge Location: CIR Barriers to Discharge: Complex Medical Issues Dispo: Anticipated discharge in approximately 2-4 day(s).   Briant Cedar, MD 11/16/2019, 5:59 AM Pager: [phone] After 5pm on weekdays and 1pm on weekends: On Call pager [phone]

## 2019-11-16 NOTE — Progress Notes (Addendum)
[facility] Progress Note  12 Days Post-Op  Subjective: CC-  Continues to have some abdominal pain, worse with movement. Pain medication helps but is afraid to take much because it (tramadol and robaxin) make her feel loopy. Doing ok with diet. Eating about 50% of meals but she drank 3 Ensure yesterday. Colostomy functioning. Off supplemental oxygen.  Objective: Vital signs in last 24 hours: Temp:  [98.1 F (36.7 C)-98.5 F (36.9 C)] 98.4 F (36.9 C) (08/16 0746) Pulse Rate:  [67-94] 94 (08/16 0746) Resp:  [18-20] 20 (08/16 0050) BP: (102-111)/(57-85) 109/64 (08/16 0746) SpO2:  [94 %-99 %] 98 % (08/16 0746) Weight:  [51.6 kg] 51.6 kg (08/16 0500) Last BM Date: 11/16/19 (ostomy with current output)  Intake/Output from previous day: 08/15 0701 - 08/16 0700 In: 360 [P.O.:360] Out: 1825 [Urine:1050; Drains:425; Stool:350] Intake/Output this shift: Total I/O In: 120 [P.O.:120] Out: -   PE: Gen: Alert, NAD, pleasant Card: RRR Pulm: rate and effort normal on room air Abd: Soft,ND,IR drain with old blood,JPdrain withpurulent fluid,ostomy with gas/liquid stool in pouch,midline incision with retention sutures in place/ fibrinous exudate at base of wound and pictured below. Surgical JP drain purulent/green, IR drain dark bloody     Lab Results:  Recent Labs    11/14/19 0705 11/16/19 0447  WBC 6.1 5.0  HGB 8.3* 8.3*  HCT 26.3* 26.0*  PLT 279 354   BMET Recent Labs    11/14/19 0705 11/16/19 0638  NA 132* 134*  K 3.7 3.4*  CL 98 101  CO2 27 26  GLUCOSE 86 82  BUN 7 5*  CREATININE <0.30* <0.30*  CALCIUM 8.1* 7.8*   PT/INR No results for input(s): LABPROT, INR in the last 72 hours. CMP     Component Value Date/Time   NA 134 (L) 11/16/2019 0638   K 3.4 (L) 11/16/2019 0638   CL 101 11/16/2019 0638   CO2 26 11/16/2019 0638   GLUCOSE 82 11/16/2019 0638   BUN 5 (L) 11/16/2019 0638   CREATININE <0.30 (L) 11/16/2019 0638   CALCIUM 7.8 (L)  11/16/2019 0638   PROT 4.6 (L) 11/10/2019 0415   ALBUMIN 1.7 (L) 11/10/2019 0415   AST 11 (L) 11/10/2019 0415   ALT 23 11/10/2019 0415   ALKPHOS 112 11/10/2019 0415   BILITOT 0.6 11/10/2019 0415   GFRNONAA NOT CALCULATED 11/16/2019 0638   GFRAA NOT CALCULATED 11/16/2019 0638   Lipase     Component Value Date/Time   LIPASE 18 09/19/2019 1631       Studies/Results: No results found.  Anti-infectives: Anti-infectives (From admission, onward)   Start     Dose/Rate Route Frequency Ordered Stop   11/11/19 1100  piperacillin-tazobactam (ZOSYN) IVPB 3.375 g     Discontinue     3.375 g 12.5 mL/hr over 240 Minutes Intravenous Every 8 hours 11/11/19 0945     11/06/19 0600  ceFAZolin (ANCEF) IVPB 2g/100 mL premix  Status:  Discontinued        2 g 200 mL/hr over 30 Minutes Intravenous Every 8 hours 11/05/19 0914 11/08/19 1133   11/04/19 0600  cefTRIAXone (ROCEPHIN) 2 g in sodium chloride 0.9 % 100 mL IVPB  Status:  Discontinued        2 g 200 mL/hr over 30 Minutes Intravenous Every 24 hours 11/04/19 0500 11/05/19 0914   10/31/19 1700  vancomycin (VANCOCIN) IVPB 1000 mg/200 mL premix  Status:  Discontinued        1,000 mg 200 mL/hr over 60 Minutes  Intravenous Every 12 hours 10/31/19 0742 11/01/19 1032   10/31/19 1000  vancomycin (VANCOREADY) IVPB 750 mg/150 mL  Status:  Discontinued        750 mg 150 mL/hr over 60 Minutes Intravenous Every 12 hours 10/31/19 0057 10/31/19 0742   10/31/19 0900  meropenem (MERREM) 2 g in sodium chloride 0.9 % 100 mL IVPB  Status:  Discontinued        2 g 200 mL/hr over 30 Minutes Intravenous Every 8 hours 10/31/19 0742 11/01/19 1032   10/31/19 0200  vancomycin (VANCOREADY) IVPB 1500 mg/300 mL        1,500 mg 150 mL/hr over 120 Minutes Intravenous  Once 10/31/19 0057 10/31/19 0700   10/31/19 0200  piperacillin-tazobactam (ZOSYN) IVPB 3.375 g  Status:  Discontinued        3.375 g 12.5 mL/hr over 240 Minutes Intravenous Every 8 hours 10/31/19 0057  10/31/19 0726   10/24/19 0600  piperacillin-tazobactam (ZOSYN) IVPB 3.375 g  Status:  Discontinued        3.375 g 12.5 mL/hr over 240 Minutes Intravenous Every 8 hours 10/23/19 2305 10/29/19 0932   10/24/19 0000  piperacillin-tazobactam (ZOSYN) IVPB 4.5 g  Status:  Discontinued        4.5 g 200 mL/hr over 30 Minutes Intravenous Every 6 hours 10/23/19 2257 10/23/19 2302   10/23/19 2315  piperacillin-tazobactam (ZOSYN) IVPB 3.375 g        3.375 g 100 mL/hr over 30 Minutes Intravenous STAT 10/23/19 2302 10/23/19 2347       Assessment/Plan 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 Henrene Pastor, per GI, started Solumedrol 7/27;GI has decreased steroid, will not plan to start biologics until infection is under control and surgical wound is completely healed  Sigmoid colon perforation-  - 10/24/19:partial colectomy and end colostomy by Dr. Kae Heller.  - 10/30/19: large mesenteric hematoma noted, hep gtt held - 11/04/19:fascial dehiscence with visible transverse colon: S/P exploratory laparotomy, colostomy revision, fascial closure, placement 4 retention sutures, placement of wound VAC, Dr. Georgette Dover - 8/11: right abdominal abscess, s/p IR drain, culture pending, continue zosyn and follow culture - BID wet to dry dressing changes -continue JP drain -tolerating soft diet and ostomy functioning Protein calorie malnutrition - prealbumin(8/2)14.2, off TPN Acute on chronic anemia- s/p 3u pRBC 7/30-8/1,Hgb8.3, stable FEN: Ensure,soft diet ID: [id number]/29;Ancef 8/6 for bacteremia (1/2 blood Cx 7/31 showed E.Coli)>>8/8, zosyn 8/11>day#5 VTE: SCD's, lovenox Foley:out 8/6  Plan: Continue drains and IV zosyn, follow culture. Encourage PO intake. CIR following. Will try adding small dose gabapentin to see if she can tolerate this and if it helps with some of  her pain.   LOS: 28 days    [facility] 11/16/2019, 8:59 AM Please see Amion for pager number during day hours 7:00am-4:30pm

## 2019-11-17 DIAGNOSIS — N280 Ischemia and infarction of kidney: Secondary | ICD-10-CM

## 2019-11-17 LAB — BASIC METABOLIC PANEL
Anion gap: 8 (ref 5–15)
BUN: 8 mg/dL (ref 6–20)
CO2: 24 mmol/L (ref 22–32)
Calcium: 7.6 mg/dL — ABNORMAL LOW (ref 8.9–10.3)
Chloride: 100 mmol/L (ref 98–111)
Creatinine, Ser: <0.3 mg/dL — ABNORMAL LOW (ref 0.44–1.00)
Glucose, Bld: 82 mg/dL (ref 70–99)
Potassium: 3.1 mmol/L — ABNORMAL LOW (ref 3.5–5.1)
Sodium: 132 mmol/L — ABNORMAL LOW (ref 135–145)

## 2019-11-17 LAB — CBC
HCT: 27.2 % — ABNORMAL LOW (ref 36.0–46.0)
Hemoglobin: 8.7 g/dL — ABNORMAL LOW (ref 12.0–15.0)
MCH: 32.7 pg (ref 26.0–34.0)
MCHC: 32 g/dL (ref 30.0–36.0)
MCV: 102.3 fL — ABNORMAL HIGH (ref 80.0–100.0)
Platelets: 396 10*3/uL (ref 150–400)
RBC: 2.66 MIL/uL — ABNORMAL LOW (ref 3.87–5.11)
RDW: 19.2 % — ABNORMAL HIGH (ref 11.5–15.5)
WBC: 6.2 10*3/uL (ref 4.0–10.5)
nRBC: 0 % (ref 0.0–0.2)

## 2019-11-17 LAB — AEROBIC/ANAEROBIC CULTURE W GRAM STAIN (SURGICAL/DEEP WOUND)

## 2019-11-17 MED ORDER — ENSURE ENLIVE PO LIQD
237.0000 mL | Freq: Four times a day (QID) | ORAL | Status: DC
  Administered 2019-11-17 – 2019-11-23 (×17): 237 mL via ORAL

## 2019-11-17 MED ORDER — GABAPENTIN 100 MG PO CAPS
200.0000 mg | ORAL_CAPSULE | Freq: Two times a day (BID) | ORAL | Status: DC
  Administered 2019-11-17 – 2019-11-23 (×12): 200 mg via ORAL
  Filled 2019-11-17 (×12): qty 2

## 2019-11-17 NOTE — Progress Notes (Signed)
Subjective:  Interviewed patient at bedside.  Team advised to keep continuing eating, and ambulating as much as possible. Stressed continued work with PT.   Discussed GI switching her to oral steroids. Will address finding a SNF, once we find a location that will accept our patient, team will switch IV ABX out to oral Reports that she is still coughing up mucus  But her breathing is good.   Concern right now is getting on the comode by herself. Team addressed concern with discharge to home, stating it would be unsafe so would highly recommend doing the temporary SNF placement.   Objective:  Vital signs in last 24 hours: Vitals:   11/16/19 2000 11/16/19 2301 11/17/19 0301 11/17/19 0500  BP: 96/62 (!) 98/59 99/62   Pulse: 81 78 78   Resp:  20 20   Temp:  98.3 F (36.8 C) 98.4 F (36.9 C)   TempSrc:  Oral Oral   SpO2: 100% 98% 97%   Weight:    54.7 kg  Height:       Physical Exam Vitals and nursing note reviewed.  Constitutional:      General: She is not in acute distress.    Appearance: Normal appearance. She is normal weight. She is not ill-appearing or toxic-appearing.  HENT:     Head: Normocephalic and atraumatic.  Cardiovascular:     Rate and Rhythm: Normal rate and regular rhythm.     Pulses: Normal pulses.          Radial pulses are 2+ on the right side and 2+ on the left side.       Dorsalis pedis pulses are 2+ on the right side and 2+ on the left side.     Heart sounds: Normal heart sounds, S1 normal and S2 normal. No murmur heard.   Pulmonary:     Effort: Pulmonary effort is normal. No respiratory distress.     Breath sounds: Normal breath sounds. No wheezing.  Musculoskeletal:     Right lower leg: No edema.     Left lower leg: No edema.  Skin:         Comments: Erythema present but not worsening, no skin break down present.  Neurological:     Mental Status: She is alert. Mental status is at baseline.  Psychiatric:        Mood and Affect: Mood normal.         Behavior: Behavior normal.        Thought Content: Thought content normal.      Assessment/Plan:  Principal Problem:   Colon perforation (HCC) Active Problems:   Crohn's disease of large intestine with other complication (HCC)   Malnutrition of moderate degree (HCC)   Pressure injury of skin   Peritoneal hematoma   Renal infarction ([place])   Acute blood loss anemia   E coli bacteremia   Hospital day4fr [id number] old femaleadmitted with newdiagnosis of severeCrohn's disease, hospitalizationcomplicated by a bowel perforation on 7/24, s/purgent partial colectomy and colostomy, S/P exploratory lap and reattachment of ostomy 8/4.   Crohn's Flare/Sigmoid Colon perforation:S/Pcolectomy on 7/24.S/P exploratory [place] morning8/5.Ostomy bag has adecent amountbrown liquidand gasin it today.Reports some irritation and pain around Ostomy. Will continue to monitor. -Continue advancing diet as able -Continue dressing changes on wound  -Per GIstarted on prednisone 20 mg daily -Continue to monitor for flare of diarrhea or hematochezia    Acute Hypoxia:Currently onroom air.Reportsminimalmucus.Reports breathing much improved. -Hold lasix   HypovolemicHyponatremia(resolved):TodayNa is132.Appears Euvolemic on exam.  Encouraged eating and drinking as much as possible. -HoldIV fluids today -Hold lasix   Right peritoneal hematoma:Hemoglobintoday is8.3.Drain still in place, being emptied less frequently.Yesterday Pseudomonas was found in the culture. Will continue on Zosyn until ready for discharge  -ContinueZosyn 3.375 g q8 (day6) -Possible consult with ID   Adjustment Disorder with Depressed Mood:Has had significant change in mood since starting medication.Mood much improved.Willcontinue tomonitor response. -ContinueEscitalopram 10 mg  -Continue tomonitor for Serotonin Syndrome due to concurrent Tramadol use   Severe protein  calorie malnutrition:Albuminwas 1.7on8/10.Currently onsoft diet, will continue to advance diet as tolerated.Encouraged eating. -Continually encouragingeating and drinking as much as possible   Left Heel:No skin breakdown present. Erythema has not increased. States shegotup to the room chair the past couple days. Plans to get up to the chair today. Encouraged thisand working with PT/OT as much as possible.  -Encouraged getting out of bed and sitting in chair -Encouraged working with PT/OT as much as possible -Daily checks   DVT prophx: SCDsandlovenoxsubcutaneous Diet:Soft diet HTX:HFSF   Prior to Admission Living Arrangement: Home Anticipated Discharge Location: SNF Barriers to Discharge: Complex Medical Conditions Dispo: Anticipated discharge in approximately 2-4 day(s).   Briant Cedar, MD 11/17/2019, 5:53 AM Pager: [phone] After 5pm on weekdays and 1pm on weekends: On Call pager [phone]

## 2019-11-17 NOTE — Progress Notes (Addendum)
[facility] Progress Note  13 Days Post-Op  Subjective: CC-  Frustrated this morning. Tired of hurting and tired of being in the hospital. Declined by CIR, they are recommending SNF. Patient/family prefer to go home with home health, and say they can provide 24 hour supervision.  Tolerating diet. Denies n/v. Colostomy functioning. Continues to have a lot of abdominal pain with movement.  Objective: Vital signs in last 24 hours: Temp:  [98.2 F (36.8 C)-98.5 F (36.9 C)] 98.2 F (36.8 C) (08/17 0825) Pulse Rate:  [78-89] 89 (08/17 0825) Resp:  [20] 20 (08/17 0301) BP: (93-118)/(46-62) 100/61 (08/17 0825) SpO2:  [97 %-100 %] 100 % (08/17 0825) Weight:  [54.7 kg] 54.7 kg (08/17 0500) Last BM Date: 11/16/19 (ostomy)  Intake/Output from previous day: 08/16 0701 - 08/17 0700 In: 963.5 [P.O.:480; IV Piggyback:483.5] Out: 940 [Urine:650; Drains:90; Stool:200] Intake/Output this shift: No intake/output data recorded.  PE: Gen: Alert, NAD, pleasant Card: RRR Pulm: rate and effort normal on room air Abd: Soft,ND,nontender, IR drain with old blood,JPdrain withpurulent/light green fluid,ostomy with gas/liquid stool in pouch,midline incisionwith retention sutures in place/ cdi dressing.   Lab Results:  Recent Labs    11/16/19 0447 11/17/19 0440  WBC 5.0 6.2  HGB 8.3* 8.7*  HCT 26.0* 27.2*  PLT 354 396   BMET Recent Labs    11/16/19 0638 11/17/19 0440  NA 134* 132*  K 3.4* 3.1*  CL 101 100  CO2 26 24  GLUCOSE 82 82  BUN 5* 8  CREATININE <0.30* <0.30*  CALCIUM 7.8* 7.6*   PT/INR No results for input(s): LABPROT, INR in the last 72 hours. CMP     Component Value Date/Time   NA 132 (L) 11/17/2019 0440   K 3.1 (L) 11/17/2019 0440   CL 100 11/17/2019 0440   CO2 24 11/17/2019 0440   GLUCOSE 82 11/17/2019 0440   BUN 8 11/17/2019 0440   CREATININE <0.30 (L) 11/17/2019 0440   CALCIUM 7.6 (L) 11/17/2019 0440   PROT 4.6 (L) 11/10/2019 0415    ALBUMIN 1.7 (L) 11/10/2019 0415   AST 11 (L) 11/10/2019 0415   ALT 23 11/10/2019 0415   ALKPHOS 112 11/10/2019 0415   BILITOT 0.6 11/10/2019 0415   GFRNONAA NOT CALCULATED 11/17/2019 0440   GFRAA NOT CALCULATED 11/17/2019 0440   Lipase     Component Value Date/Time   LIPASE 18 09/19/2019 1631       Studies/Results: No results found.  Anti-infectives: Anti-infectives (From admission, onward)   Start     Dose/Rate Route Frequency Ordered Stop   11/11/19 1100  piperacillin-tazobactam (ZOSYN) IVPB 3.375 g     Discontinue     3.375 g 12.5 mL/hr over 240 Minutes Intravenous Every 8 hours 11/11/19 0945     11/06/19 0600  ceFAZolin (ANCEF) IVPB 2g/100 mL premix  Status:  Discontinued        2 g 200 mL/hr over 30 Minutes Intravenous Every 8 hours 11/05/19 0914 11/08/19 1133   11/04/19 0600  cefTRIAXone (ROCEPHIN) 2 g in sodium chloride 0.9 % 100 mL IVPB  Status:  Discontinued        2 g 200 mL/hr over 30 Minutes Intravenous Every 24 hours 11/04/19 0500 11/05/19 0914   10/31/19 1700  vancomycin (VANCOCIN) IVPB 1000 mg/200 mL premix  Status:  Discontinued        1,000 mg 200 mL/hr over 60 Minutes Intravenous Every 12 hours 10/31/19 0742 11/01/19 1032   10/31/19 1000  vancomycin (VANCOREADY) IVPB  750 mg/150 mL  Status:  Discontinued        750 mg 150 mL/hr over 60 Minutes Intravenous Every 12 hours 10/31/19 0057 10/31/19 0742   10/31/19 0900  meropenem (MERREM) 2 g in sodium chloride 0.9 % 100 mL IVPB  Status:  Discontinued        2 g 200 mL/hr over 30 Minutes Intravenous Every 8 hours 10/31/19 0742 11/01/19 1032   10/31/19 0200  vancomycin (VANCOREADY) IVPB 1500 mg/300 mL        1,500 mg 150 mL/hr over 120 Minutes Intravenous  Once 10/31/19 0057 10/31/19 0700   10/31/19 0200  piperacillin-tazobactam (ZOSYN) IVPB 3.375 g  Status:  Discontinued        3.375 g 12.5 mL/hr over 240 Minutes Intravenous Every 8 hours 10/31/19 0057 10/31/19 0726   10/24/19 0600  piperacillin-tazobactam  (ZOSYN) IVPB 3.375 g  Status:  Discontinued        3.375 g 12.5 mL/hr over 240 Minutes Intravenous Every 8 hours 10/23/19 2305 10/29/19 0932   10/24/19 0000  piperacillin-tazobactam (ZOSYN) IVPB 4.5 g  Status:  Discontinued        4.5 g 200 mL/hr over 30 Minutes Intravenous Every 6 hours 10/23/19 2257 10/23/19 2302   10/23/19 2315  piperacillin-tazobactam (ZOSYN) IVPB 3.375 g        3.375 g 100 mL/hr over 30 Minutes Intravenous STAT 10/23/19 2302 10/23/19 2347       Assessment/Plan 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  - 10/30/19: large mesenteric hematoma noted, hep gtt held - 11/04/19:fascial dehiscence with visible transverse colon: S/P exploratory laparotomy, colostomy revision, fascial closure, placement 4 retention sutures, placement of wound VAC, Dr. Georgette Dover - 8/11: right abdominal abscess, s/p IR drain, culture pending, continue zosyn and follow culture - BID wet to dry dressing changes -continue surgical JP drain - monitor closely, newly purulent/green tinged -tolerating soft diet and ostomy functioning Protein calorie malnutrition - prealbumin(8/2)14.2, off TPN Acute on chronic anemia- s/p 3u pRBC 7/30-8/1,Hgb8.7, stable FEN: Ensure,soft diet ID: [id number];Ancef 8/6 for bacteremia (1/2 blood Cx 7/31 showed E.Coli)>>8/8, zosyn 8/11>day#6 VTE: SCD's, lovenox Foley:out 8/6  Plan: Continue drains and IV zosyn, follow culture. Monitor drain output, plan repeat CT once output decreases. CIR no longer recommending admission, family prefers home  with home health, will ask TOC team to see. Increase gabapentin to 22m BID. Home health and DME orders placed.   LOS: 29 days    [facility] 11/17/2019, 8:28 AM Please see Amion for pager number during day hours 7:00am-4:30pm

## 2019-11-18 MED ORDER — CIPROFLOXACIN HCL 500 MG PO TABS
500.0000 mg | ORAL_TABLET | Freq: Two times a day (BID) | ORAL | Status: DC
  Administered 2019-11-18 – 2019-11-23 (×10): 500 mg via ORAL
  Filled 2019-11-18 (×10): qty 1

## 2019-11-18 NOTE — Progress Notes (Signed)
[facility] Progress Note  14 Days Post-Op  Subjective: CC-  Feeling ok today. Appetite somewhat improved this morning. Denies n/v. Colostomy functioning. Tolerated sitting in the chair for 4 hours yesterday. Pain control somewhat better, key factor was finding pillow positioning in the chair that decreased pain in her tailbone.  Objective: Vital signs in last 24 hours: Temp:  [97.6 F (36.4 C)-98.7 F (37.1 C)] 98.5 F (36.9 C) (08/18 0809) Pulse Rate:  [57-77] 77 (08/18 0809) Resp:  [15-20] 15 (08/18 0809) BP: (103-109)/(59-63) 109/63 (08/18 0809) SpO2:  [98 %-100 %] 100 % (08/18 0809) Weight:  [50.6 kg] 50.6 kg (08/18 0500) Last BM Date: 11/17/19 (ostomy)  Intake/Output from previous day: 08/17 0701 - 08/18 0700 In: 960 [P.O.:960] Out: 1188 [Urine:775; Drains:113; Stool:300] Intake/Output this shift: No intake/output data recorded.  PE: Gen: Alert, NAD, pleasant Pulm: rate and effort normalon room air Abd: Soft,ND,nontender, IR drain with old blood,JPdrain withtan fluid,ostomywith gas/liquid stool in pouch,midline incisionwith retention sutures in place and pictured below     Lab Results:  Recent Labs    11/16/19 0447 11/17/19 0440  WBC 5.0 6.2  HGB 8.3* 8.7*  HCT 26.0* 27.2*  PLT 354 396   BMET Recent Labs    11/16/19 0638 11/17/19 0440  NA 134* 132*  K 3.4* 3.1*  CL 101 100  CO2 26 24  GLUCOSE 82 82  BUN 5* 8  CREATININE <0.30* <0.30*  CALCIUM 7.8* 7.6*   PT/INR No results for input(s): LABPROT, INR in the last 72 hours. CMP     Component Value Date/Time   NA 132 (L) 11/17/2019 0440   K 3.1 (L) 11/17/2019 0440   CL 100 11/17/2019 0440   CO2 24 11/17/2019 0440   GLUCOSE 82 11/17/2019 0440   BUN 8 11/17/2019 0440   CREATININE <0.30 (L) 11/17/2019 0440   CALCIUM 7.6 (L) 11/17/2019 0440   PROT 4.6 (L) 11/10/2019 0415   ALBUMIN 1.7 (L) 11/10/2019 0415   AST 11 (L) 11/10/2019 0415   ALT 23 11/10/2019 0415   ALKPHOS  112 11/10/2019 0415   BILITOT 0.6 11/10/2019 0415   GFRNONAA NOT CALCULATED 11/17/2019 0440   GFRAA NOT CALCULATED 11/17/2019 0440   Lipase     Component Value Date/Time   LIPASE 18 09/19/2019 1631       Studies/Results: No results found.  Anti-infectives: Anti-infectives (From admission, onward)   Start     Dose/Rate Route Frequency Ordered Stop   11/11/19 1100  piperacillin-tazobactam (ZOSYN) IVPB 3.375 g     Discontinue     3.375 g 12.5 mL/hr over 240 Minutes Intravenous Every 8 hours 11/11/19 0945     11/06/19 0600  ceFAZolin (ANCEF) IVPB 2g/100 mL premix  Status:  Discontinued        2 g 200 mL/hr over 30 Minutes Intravenous Every 8 hours 11/05/19 0914 11/08/19 1133   11/04/19 0600  cefTRIAXone (ROCEPHIN) 2 g in sodium chloride 0.9 % 100 mL IVPB  Status:  Discontinued        2 g 200 mL/hr over 30 Minutes Intravenous Every 24 hours 11/04/19 0500 11/05/19 0914   10/31/19 1700  vancomycin (VANCOCIN) IVPB 1000 mg/200 mL premix  Status:  Discontinued        1,000 mg 200 mL/hr over 60 Minutes Intravenous Every 12 hours 10/31/19 0742 11/01/19 1032   10/31/19 1000  vancomycin (VANCOREADY) IVPB 750 mg/150 mL  Status:  Discontinued        750 mg 150  mL/hr over 60 Minutes Intravenous Every 12 hours 10/31/19 0057 10/31/19 0742   10/31/19 0900  meropenem (MERREM) 2 g in sodium chloride 0.9 % 100 mL IVPB  Status:  Discontinued        2 g 200 mL/hr over 30 Minutes Intravenous Every 8 hours 10/31/19 0742 11/01/19 1032   10/31/19 0200  vancomycin (VANCOREADY) IVPB 1500 mg/300 mL        1,500 mg 150 mL/hr over 120 Minutes Intravenous  Once 10/31/19 0057 10/31/19 0700   10/31/19 0200  piperacillin-tazobactam (ZOSYN) IVPB 3.375 g  Status:  Discontinued        3.375 g 12.5 mL/hr over 240 Minutes Intravenous Every 8 hours 10/31/19 0057 10/31/19 0726   10/24/19 0600  piperacillin-tazobactam (ZOSYN) IVPB 3.375 g  Status:  Discontinued        3.375 g 12.5 mL/hr over 240 Minutes Intravenous  Every 8 hours 10/23/19 2305 10/29/19 0932   10/24/19 0000  piperacillin-tazobactam (ZOSYN) IVPB 4.5 g  Status:  Discontinued        4.5 g 200 mL/hr over 30 Minutes Intravenous Every 6 hours 10/23/19 2257 10/23/19 2302   10/23/19 2315  piperacillin-tazobactam (ZOSYN) IVPB 3.375 g        3.375 g 100 mL/hr over 30 Minutes Intravenous STAT 10/23/19 2302 10/23/19 2347       Assessment/Plan 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 Henrene Pastor, per GI, started Solumedrol 7/27;GI has decreased steroid, on PO prednisone, will not plan to start biologics until infection is under control and surgical wound is completely healed  Sigmoid colon perforation-  - 10/24/19:partial colectomy and end colostomy by Dr. Kae Heller.  - 10/30/19: large mesenteric hematoma noted, hep gtt held - 11/04/19:fascial dehiscence with visible transverse colon: S/P exploratory laparotomy, colostomy revision, fascial closure, placement 4 retention sutures, placement of wound VAC, Dr. Georgette Dover - 8/11: right abdominal abscess, s/p IR drain, culture KLEBSIELLA and PSEUDOMONAS AERUGINOSA, switched from zosyn to cipro 8/18 - BID wet to dry dressing changes -continue surgical JP drain - monitor closely, newly purulent/green tinged -tolerating soft diet and ostomy functioning Protein calorie malnutrition - prealbumin(8/2)14.2, off TPN Acute on chronic anemia- s/p 3u pRBC 7/30-8/1,Hgb8.7 (8/17), stable FEN: Ensure,soft diet ID: [id number]/29;Ancef 8/6 for bacteremia (1/2 blood Cx 7/31 showed E.Coli)>>8/8, zosyn 8/11>8/18, cipro 8/18>> VTE: SCD's, lovenox Foley:out 8/6  Plan:Switch zosyn to cipro. Continue drains and monitor drain output, plan repeat CT once output decreases. Home health orders are in. [facility] for discharge once disposition arranged. Continue therapies, encourage mobilize.  Encourage PO intake. I will arrange follow up in our office with Dr. Kae Heller.   LOS: 30 days    [facility] 11/18/2019, 8:38 AM Please see Amion for pager number during day hours 7:00am-4:30pm

## 2019-11-18 NOTE — Progress Notes (Signed)
IP rehab admissions:  I have called [employer] insurance to reopen the case and to request CIR.  Patient participated well with OT today and she was up for several hours yesterday.  I will let all know when I hear back from [facility].  Call me for questions.  [phone]

## 2019-11-18 NOTE — Discharge Instructions (Signed)
Wet to Dry WOUND CARE: - Change dressing twice daily - Supplies: sterile saline, kerlex, scissors, ABD pads, tape  1. Remove dressing and all packing carefully, moistening with sterile saline as needed to avoid packing/internal dressing sticking to the wound. 2.   Clean edges of skin around the wound with water/gauze, making sure there is no tape debris or leakage left on skin that could cause skin irritation or breakdown. 3.   Dampen and clean kerlex with sterile saline and pack wound from wound base to skin level, making sure to take note of any possible areas of wound tracking, tunneling and packing appropriately. Wound can be packed loosely. Trim kerlex to size if a whole kerlex is not required. 4.   Cover wound with a dry ABD pad and secure with tape.  5.   Write the date/time on the dry dressing/tape to better track when the last dressing change occurred. - apply any skin protectant/powder if recommended by clinician to protect skin/skin folds. - change dressing as needed if leakage occurs, wound gets contaminated, or patient requests to shower. - You may shower daily with wound open and following the shower the wound should be dried and a clean dressing placed.  - Medical grade tape as well as packing supplies can be found at [facility] on [address]. The remaining supplies can be found at your local drug store, [employer] etc.   Colostomy Home Guide, Adult  Colostomy surgery is done to create an opening in the front of the abdomen for stool (feces) to leave the body through an ostomy (stoma). Part of the large intestine is attached to the stoma. A bag, also called a pouch, is fitted over the stoma. Stool and gas will collect in the bag. After surgery, you will need to empty and change your colostomy bag as needed. You will also need to care for your stoma. How to care for the stoma Your stoma should look pink, red, and moist, like the inside of your cheek. Soon after surgery, the stoma  may be swollen, but this swelling will go away within 6 weeks. To care for the stoma:  Keep the skin around the stoma clean and dry.  Use a clean, soft washcloth to gently wash the stoma and the skin around it. Clean using a circular motion, and wipe away from the stoma opening, not toward it. ? Use warm water and only use cleansers recommended by your health care provider. ? Rinse the stoma area with plain water. ? Dry the area around the stoma well.  Use stoma powder or ointment on your skin only as told by your health care provider. Do not use any other powders, gels, wipes, or creams on the skin around the stoma.  Check the stoma area every day for signs of infection. Check for: ? New or worsening redness, swelling, or pain. ? New or increased fluid or blood. ? Pus or warmth.  Measure the stoma opening regularly and record the size. Watch for changes. (It is normal for the stoma to get smaller as swelling goes away.) Share this information with your health care provider. How to empty the colostomy bag  Empty your bag at bedtime and whenever it is one-third to one-half full. Do not let the bag get more than half-full with stool or gas. The bag could leak if it gets too full. Some colostomy bags have a built-in gas release valve that releases gas often throughout the day. Follow these basic steps: 1.  Wash your hands with soap and water. 2. Sit far back on the toilet seat. 3. Put several pieces of toilet paper into the toilet water. This will prevent splashing as you empty stool into the toilet. 4. Remove the clip or the hook-and-loop fastener from the tail end of the bag. 5. Unroll the tail, then empty the stool into the toilet. 6. Clean the tail with toilet paper or a moist towelette. 7. Reroll the tail, and close it with the clip or the hook-and-loop fastener. 8. Wash your hands again. How to change the colostomy bag Change your bag every 3-4 days or as often as told by your health  care provider. Also change the bag if it is leaking or separating from the skin, or if your skin around the stoma looks or feels irritated. Irritated skin may be a sign that the bag is leaking. Always have colostomy supplies with you, and follow these basic steps: 1. Wash your hands with soap and water. Have paper towels or tissues nearby to clean any discharge. 2. Remove the old bag and skin barrier. Use your fingers or a warm cloth to gently push the skin away from the barrier. 3. Clean the stoma area with water or with mild soap and water, as directed. Use water to rinse away any soap. 4. Dry the skin. You may use the cool setting on a hair dryer to do this. 5. Use a tracing pattern (template) to cut the skin barrier to the size needed. 6. If you are using a two-piece bag, attach the bag and the skin barrier to each other. Add the barrier ring, if you use one. 7. If directed, apply stoma powder or skin barrier gel to the skin. 8. Warm the skin barrier with your hands, or blow with a hair dryer for 5-10 seconds. 9. Remove the paper from the adhesive strip of the skin barrier. 10. Press the adhesive strip onto the skin around the stoma. 11. Gently rub the skin barrier onto the skin. This creates heat that helps the barrier to stick. 12. Apply stoma tape to the edges of the skin barrier, if desired. 66. Wash your hands again. General recommendations  Avoid wearing tight clothes or having anything press directly on your stoma or bag. Change your clothing whenever it is soiled or damp.  You may shower or bathe with the bag on or off. Do not use harsh or oily soaps or lotions. Dry the skin and bag after bathing.  Store all supplies in a cool, dry place. Do not leave supplies in extreme heat because some parts can melt or not stick as well.  Whenever you leave home, take extra clothing and an extra skin barrier and bag with you.  If your bag gets wet, you can dry it with a hair dryer on the cool  setting.  To prevent odor, you may put drops of ostomy deodorizer in the bag.  If recommended by your health care provider, put ostomy lubricant inside the bag. This helps stool to slide out of the bag more easily and completely. Contact a health care provider if:  You have new or worsening redness, swelling, or pain around your stoma.  You have new or increased fluid or blood coming from your stoma.  Your stoma feels warm to the touch.  You have pus coming from your stoma.  Your stoma extends in or out farther than normal.  You need to change your bag every day.  You have  a fever. Get help right away if:  Your stool is bloody.  You have nausea or you vomit.  You have trouble breathing. Summary  Measure your stoma opening regularly and record the size. Watch for changes.  Empty your bag at bedtime and whenever it is one-third to one-half full. Do not let the bag get more than half-full with stool or gas.  Change your bag every 3-4 days or as often as told by your health care provider.  Whenever you leave home, take extra clothing and an extra skin barrier and bag with you. This information is not intended to replace advice given to you by your health care provider. Make sure you discuss any questions you have with your health care provider. Document Revised: 07/09/2018 Document Reviewed: 09/12/2016 Elsevier Patient Education  2020 [facility], [place] [phone]  OPEN ABDOMINAL SURGERY: POST OP INSTRUCTIONS  Always review your discharge instruction sheet given to you by the facility where your surgery was performed.  IF YOU HAVE DISABILITY OR FAMILY LEAVE FORMS, YOU MUST BRING THEM TO THE OFFICE FOR PROCESSING.  PLEASE DO NOT GIVE THEM TO YOUR DOCTOR.  1. A prescription for pain medication may be given to you upon discharge.  Take your pain medication as prescribed, if needed.  If narcotic pain medicine is not needed, then you may  take acetaminophen (Tylenol) or ibuprofen (Advil) as needed. 2. Take your usually prescribed medications unless otherwise directed. 3. If you need a refill on your pain medication, please contact your pharmacy. They will contact our office to request authorization.  Prescriptions will not be filled after 5pm or on week-ends. 4. You should follow a light diet the first few days after arrival home, such as soup and crackers, pudding, etc.unless your doctor has advised otherwise. A high-fiber, low fat diet can be resumed as tolerated.   Be sure to include lots of fluids daily. Most patients will experience some swelling and bruising on the chest and neck area.  Ice packs will help.  Swelling and bruising can take several days to resolve 5. Most patients will experience some swelling and bruising in the area of the incision. Ice pack will help. Swelling and bruising can take several days to resolve..  6. ACTIVITIES:  You may resume regular (light) daily activities beginning the next day--such as daily self-care, walking, climbing stairs--gradually increasing activities as tolerated.  You may have sexual intercourse when it is comfortable.  Refrain from any heavy lifting or straining until approved by your doctor. a. You may drive when you no longer are taking prescription pain medication, you can comfortably wear a seatbelt, and you can safely maneuver your car and apply brakes 7. You should see your doctor in the office for a follow-up appointment approximately two weeks after your surgery.  Make sure that you call for this appointment within a day or two after you arrive home to insure a convenient appointment time.   WHEN TO CALL YOUR DOCTOR: 1. Fever over 101.0 2. Inability to urinate 3. Nausea and/or vomiting 4. Extreme swelling or bruising 5. Continued bleeding from incision. 6. Increased pain, redness, or drainage from the incision. 7. Difficulty swallowing or breathing 8. Muscle cramping or  spasms. 9. Numbness or tingling in hands or feet or around lips.  The clinic staff is available to answer your questions during regular business hours.  Please don't hesitate to call and ask to speak to one of  the nurses if you have concerns.  For further questions, please visit [email or web address]

## 2019-11-18 NOTE — Progress Notes (Signed)
Subjective:  Interviewed patient at bedside.   Reports she's doing well. Drinking fine, working on eating more. States she was able to sit up in the chair yesterday for four hours. Otherwise no complaints.  Objective:  Vital signs in last 24 hours: Vitals:   11/17/19 2328 11/18/19 0440 11/18/19 0500 11/18/19 0809  BP: 106/63 104/63  109/63  Pulse: 69 68  77  Resp: 20 18  15   Temp: 97.6 F (36.4 C) 97.8 F (36.6 C)  98.5 F (36.9 C)  TempSrc: Oral Oral  Oral  SpO2: 98% 99%  100%  Weight:   50.6 kg   Height:       Physical Exam Vitals and nursing note reviewed.  Constitutional:      General: She is not in acute distress.    Appearance: Normal appearance. She is not ill-appearing or toxic-appearing.  HENT:     Head: Normocephalic and atraumatic.  Cardiovascular:     Rate and Rhythm: Normal rate and regular rhythm.     Pulses: Normal pulses.          Radial pulses are 2+ on the right side and 2+ on the left side.       Dorsalis pedis pulses are 2+ on the right side and 2+ on the left side.     Heart sounds: Normal heart sounds, S1 normal and S2 normal. No murmur heard.   Pulmonary:     Effort: Pulmonary effort is normal. No respiratory distress.     Breath sounds: Normal breath sounds. No wheezing.  Abdominal:     General: There is no distension.     Palpations: Abdomen is soft. There is no mass.     Tenderness: There is no abdominal tenderness.  Musculoskeletal:     Right lower leg: No edema.     Left lower leg: No edema.  Neurological:     Mental Status: She is alert. Mental status is at baseline.  Psychiatric:        Mood and Affect: Mood normal.        Behavior: Behavior normal.        Thought Content: Thought content normal.      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   Crohn's Flare/Sigmoid Colon perforation:S/Pcolectomy on 7/24.S/P exploratory [place] morning8/5.Ostomy bag still has decentbrown liquidand gasoutput today.Reports some irritation and pain around Ostomy. Will continue to monitor. -Continue advancing diet as able -Continue dressing changes on wound  -Continue on prednisone 20 mg daily -Continue to monitor for flare of diarrhea or hematochezia    Acute Hypoxia:Currently onroom air.Reportsminimalmucus.Reports breathing much improved. -Holdlasix   HypovolemicHyponatremia(resolved):TodayNa is132.Still Euvolemic on exam. Continuing to encourage eating and drinking as much as possible. -HoldIV fluids today -Holdlasix   Right peritoneal hematoma:Hemoglobintoday is8.7.Drain still in place, being emptied less frequently.Surgery recommends switching from Zosyn to Ciprofloxacin. Completed 6 days of IV Zosyn 3.375 g q8.  -StoppedZosyn 3.375 g q8 -Started Ciprofloxacin 500 mg BID -Will continue to monitor output in drains   Adjustment  Disorder with Depressed Mood:Has had significant change in mood since starting medication.Mood much improved.Willcontinue tomonitor response. -ContinueEscitalopram 10 mg  -Continue tomonitor for Serotonin Syndrome due to concurrent Tramadol use   Severe protein calorie malnutrition:Albuminwas 1.7on8/10.Currently onsoft diet, will continue to advance diet as tolerated.Encouraged eating as much as tolerated. -Continually encouragingeating and drinking as much as possible   Left Heel:No skin breakdown present.  Erythema has resolved. States shegotup to the room chair the past couple days. Encourage to conitnue to get up to the chair. Encouraged working with PT/OT as much as possible.  -Encouraged getting out of bed and sitting in chair -Encouraged working with PT/OT as much as possible -Daily checks   DVT prophx: SCDsandlovenoxsubcutaneous Diet:Soft diet MBW:GYKZ   Prior to Admission Living Arrangement: Home Anticipated Discharge Location: Home Barriers to Discharge: Complex Medical Issues Dispo: Anticipated discharge in approximately 2-4 day(s).   Briant Cedar, MD 11/18/2019, 10:26 AM Pager: [phone] After 5pm on weekdays and 1pm on weekends: On Call pager [phone]

## 2019-11-18 NOTE — Progress Notes (Signed)
Occupational Therapy Treatment Patient Details Name: Natasha Barrera MRN: [id number] DOB: 1965-11-01 Today's Date: 11/18/2019    History of present illness Natasha Barrera is a 54 year old female with PMH significant for HTN and diverticulitis (s/p sigmoid colectomy 2015) who originally presented with complaints of LE edema with later reports of abdominal pain and decreased appetite.  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 s/p exp laparatomy for dehiscence, colostomy revision, and wound vac placement 8/5.   OT comments  Pt tolerated RA with max HR 120 during session with stedy to transfer to the chair. Pt completed bed mobility with min (A) and self initiatoin. Pt requires UB strength to pull on stedy to come to standing in stedy max (A) from elevated surface. Pt educated on the need to engage in daily therapy to progress to CIR and having RN staff use stedy to help get extra practice. Pt educated to advocate for OOB daily x3. Pt ending session with LB and UB exercises program.   Follow Up Recommendations  CIR;Supervision/Assistance - 24 hour    Equipment Recommendations  Other (comment) (tBA)    Recommendations for Other Services Rehab consult    Precautions / Restrictions Precautions Precautions: Fall Precaution Comments: Jp drain x2 Right side/ ostomy L side / abdomen binder when up       Mobility Bed Mobility Overal bed mobility: Needs Assistance Bed Mobility: Rolling;Supine to Sit Rolling: Supervision   Supine to sit: Min assist     General bed mobility comments: pt using bed rail to roll toward R side then reaching for therapist with L UE to pull self up into static sitting. pt reports little dizziness. pt allowed prolonged sitting ~45 second with  symptoms resolving    Transfers Overall transfer level: Needs assistance   Transfers: Sit to/from Stand Sit to Stand: Max assist         General transfer comment: pt requires bed elevated adn able to use anterior weright shift to shift onto feet. pt reports no sensation in feet    Balance Overall balance assessment: Needs assistance Sitting-balance support: Bilateral upper extremity supported;Feet supported Sitting balance-Leahy Scale: Fair Sitting balance - Comments: requires use of bil UE   Standing balance support: Bilateral upper extremity supported;During functional activity Standing balance-Leahy Scale: Poor Standing balance comment: requires seat of stedy. pt able to complete max 50 second stand then rest break.                            ADL either performed or assessed with clinical judgement   ADL Overall ADL's : Needs assistance/impaired                     Lower Body Dressing: Moderate assistance;Bed level Lower Body Dressing Details (indicate cue type and reason): pt using bil UE to pull leg up and OT to (A) with figure 4. pt able to loop sock over toes and pull on socks Toilet Transfer: Maximal assistance Toilet Transfer Details (indicate cue type and reason): simulated sit<>Stand from bed with stedy           General ADL Comments: pt completed transfer from supine to sit then sit<>Stand x4 in stedy this session     Vision  Perception     Praxis      Cognition Arousal/Alertness: Awake/alert Behavior During Therapy: WFL for tasks assessed/performed Overall Cognitive Status: Impaired/Different from baseline                         Following Commands: Follows one step commands with increased time                Exercises Other Exercises Other Exercises: sit <>Stand holding for as long as she can tolerate with distractions of questions like favorite color purple flower is a red rose animal is a dog Restaurant manager, fast food) MAx stand 50 seconds. x4 stands total  Other Exercises: educated on hip flexion, quad activation with knee extension , adduction of knees Other Exercises: shoulder pressure and scapula retraction / protraction   Shoulder Instructions       General Comments RA HR 120    Pertinent Vitals/ Pain       Pain Assessment: Faces Faces Pain Scale: Hurts a little bit Pain Location: abdomen Pain Descriptors / Indicators: Operative site guarding Pain Intervention(s): Monitored during session;Premedicated before session;Repositioned  Home Living                                          Prior Functioning/Environment              Frequency  Min 2X/week        Progress Toward Goals  OT Goals(current goals can now be found in the care plan section)  Progress towards OT goals: Progressing toward goals  Acute Rehab OT Goals Patient Stated Goal: to be able to go to a bathroom  OT Goal Formulation: With patient Time For Goal Achievement: 12/02/19 Potential to Achieve Goals: Good ADL Goals Pt Will Perform Grooming: with modified independence;sitting Pt Will Perform Upper Body Bathing: with set-up;sitting Pt Will Perform Lower Body Bathing: bed level;with min assist Pt Will Perform Upper Body Dressing: with modified independence;sitting Pt Will Perform Lower Body Dressing: with min assist;bed level Pt Will Transfer to Toilet: with mod assist;bedside commode (stedy)  Plan Discharge plan remains appropriate    Co-evaluation                 AM-PAC OT "6 Clicks" Daily Activity     Outcome Measure   Help from another person eating meals?: A Little Help from another person taking care of personal grooming?: A Lot Help from another person toileting, which includes using toliet, bedpan, or urinal?: A Lot Help from another person bathing (including washing, rinsing, drying)?: A Lot Help from another person to put on and taking off regular upper body  clothing?: A Lot Help from another person to put on and taking off regular lower body clothing?: A Lot 6 Click Score: 13    End of Session    OT Visit Diagnosis: Unsteadiness on feet (R26.81);Muscle weakness (generalized) (M62.81);Pain   Activity Tolerance Patient tolerated treatment well   Patient Left in chair;with call bell/phone within reach;with chair alarm set   Nurse Communication Mobility status;Precautions        Time: 3570-1779 OT Time Calculation (min): 23 min  Charges: OT General Charges $OT Visit: 1 Visit OT Treatments $Self Care/Home Management : 23-37 mins   Natasha Barrera, OTR/L  Acute Rehabilitation Services Pager: [phone] Office: [phone] .    Natasha Barrera 11/18/2019, 12:22 PM

## 2019-11-18 NOTE — Progress Notes (Signed)
Physical Therapy Treatment Patient Details Name: Natasha Barrera MRN: [id number] DOB: 17-Apr-1965 Today's Date: 11/18/2019    History of Present Illness Natasha Barrera is a 54 year old female with PMH significant for HTN and diverticulitis (s/p sigmoid colectomy 2015) who originally presented with complaints of LE edema with later reports of abdominal pain and decreased appetite.  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 s/p exp laparatomy for dehiscence, colostomy revision, and wound vac placement 8/5.    PT Comments    Pt tolerates transfer training well this session. Pt does continue to present with significant BLE weakness, with intermittent knee buckling with weight shifts during standing. PT attempts to reinforce HEP and progress with use of theraband for resistance. Pt will benefit from continued acute PT POC to reduce falls risk and to improve transfer quality. Pt will benefit from initiation of lateral scoot transfers as the pt is declining SNF placement and has been denied CIR, lateral transfers will likely give her the safest transfer option upon return home (other than dependent transfers with use of mechanical lift). PT continues to recommend SNF placement as this is the safest option for the patient, however since she is declining she will benefit from HHPT).   Follow Up Recommendations  Home health PT (pt denied CIR, declining SNF)     Equipment Recommendations  Rolling walker with 5" wheels;3in1 (PT);Other (comment) (pt reports she has wheelchair available, mechanical lift)    Recommendations for Other Services       Precautions / Restrictions Precautions Precautions: Fall Precaution Comments: Jp drain x2 Right side/ ostomy L side / abdomen binder when  up Restrictions Weight Bearing Restrictions: No    Mobility  Bed Mobility Overal bed mobility: Needs Assistance Bed Mobility: Supine to Sit;Sit to Supine Rolling: Supervision   Supine to sit: Supervision Sit to supine: Min guard      Transfers Overall transfer level: Needs assistance Equipment used: Rolling walker (2 wheeled);1 person hand held assist Transfers: Sit to/from Stand Sit to Stand: Mod assist         General transfer comment: 1st attempt with use of RW pt unable to complete stand with PT assist. PT then provides L knee block for stand attempts 2-4 and R knee block for attempt 5. Pt requires assist to power up and verbal cues to extend trunk. PT assists pt in pre-gait weight shifting exercise with one instance of R knee buckling and multiple losses of balance  Ambulation/Gait                 Stairs             Wheelchair Mobility    Modified Rankin (Stroke Patients Only)       Balance Overall balance assessment: Needs assistance Sitting-balance support: No upper extremity supported;Feet supported Sitting balance-Leahy Scale: Fair     Standing balance support: Bilateral upper extremity supported Standing balance-Leahy Scale: Poor Standing balance comment: modA to maintain static standing, max with any weight shift                            Cognition Arousal/Alertness: Awake/alert Behavior During Therapy: WFL for tasks assessed/performed Overall Cognitive Status: Within Functional Limits for tasks assessed  Exercises General Exercises - Lower Extremity Ankle Circles/Pumps: AROM;Both;10 reps Heel Slides: AROM;Both;10 reps Straight Leg Raises: AROM;Both;10 reps Hip Flexion/Marching: AROM;Both;5 reps Other Exercises Other Exercises: PT provides orange and green theraband for resistive exercise to progress HEP as appropriate    General Comments General comments (skin  integrity, edema, etc.): VSS on RA      Pertinent Vitals/Pain Pain Assessment: Faces Faces Pain Scale: Hurts little more Pain Location: abdomen Pain Descriptors / Indicators: Grimacing Pain Intervention(s): Monitored during session    Home Living                      Prior Function            PT Goals (current goals can now be found in the care plan section) Acute Rehab PT Goals Patient Stated Goal: to transfer to commode and shower Progress towards PT goals: Progressing toward goals    Frequency    Min 3X/week      PT Plan Current plan remains appropriate    Co-evaluation              AM-PAC PT "6 Clicks" Mobility   Outcome Measure  Help needed turning from your back to your side while in a flat bed without using bedrails?: None Help needed moving from lying on your back to sitting on the side of a flat bed without using bedrails?: None Help needed moving to and from a bed to a chair (including a wheelchair)?: A Lot Help needed standing up from a chair using your arms (e.g., wheelchair or bedside chair)?: A Lot Help needed to walk in hospital room?: Total Help needed climbing 3-5 steps with a railing? : Total 6 Click Score: 14    End of Session   Activity Tolerance: Patient tolerated treatment well Patient left: in bed;with call bell/phone within reach;with bed alarm set Nurse Communication: Mobility status PT Visit Diagnosis: Other abnormalities of gait and mobility (R26.89);Muscle weakness (generalized) (M62.81)     Time: 3818-2993 PT Time Calculation (min) (ACUTE ONLY): 23 min  Charges:  $Therapeutic Exercise: 8-22 mins $Therapeutic Activity: 8-22 mins                     Zenaida Niece, PT, DPT Acute Rehabilitation Pager: [phone]    Zenaida Niece 11/18/2019, 4:11 PM

## 2019-11-18 NOTE — Progress Notes (Signed)
Referring Physician(s): Dr. Donne Hazel  Supervising Physician: Daryll Brod  Patient Status:  [facility] - In-pt  Chief Complaint: Colon perforation.   Subjective: Patient working with PT. She says she is feeling better and is working towards going home.   Allergies: Patient has no known allergies.  Medications: Prior to Admission medications   Medication Sig Start Date End Date Taking? Authorizing Provider  Calcium Carb-Cholecalciferol (CALCIUM+D3 PO) Take 1 tablet by mouth daily.   Yes [provider]  Cholecalciferol (VITAMIN D-3) 125 MCG (5000 UT) TABS Take 5,000 Units by mouth daily.   Yes [provider]  loperamide (IMODIUM A-D) 2 MG tablet Take 1 tablet (2 mg total) by mouth 4 (four) times daily as needed for diarrhea or loose stools. 09/20/19  Yes Maudie Mercury, MD  Multiple Vitamin (MULTIVITAMIN WITH MINERALS) TABS tablet Take 1 tablet by mouth daily. Centrum Silver   Yes [provider]     Vital Signs: BP 101/60 (BP Location: Left Arm)   Pulse 83   Temp 98.1 F (36.7 C) (Oral)   Resp 14   Ht 5' 1"  (1.549 m)   Wt 111 lb 8.8 oz (50.6 kg)   LMP 12/15/2014   SpO2 99%   BMI 21.08 kg/m   Physical Exam Pulmonary:     Effort: Pulmonary effort is normal.  Abdominal:     Comments: Right JP drains intact. IR drain filled with approximately 30 cc serosanguineous fluid.   Skin:    General: Skin is warm and dry.  Neurological:     Mental Status: She is alert and oriented to person, place, and time.     Imaging: No results found.  Labs:  CBC: Recent Labs    11/13/19 0521 11/14/19 0705 11/16/19 0447 11/17/19 0440  WBC 5.1 6.1 5.0 6.2  HGB 7.7* 8.3* 8.3* 8.7*  HCT 24.6* 26.3* 26.0* 27.2*  PLT 257 279 354 396    COAGS: Recent Labs    10/31/19 1721 11/06/19 0725  INR 1.2 1.1  APTT  --  32    BMP: Recent Labs    11/13/19 0521 11/14/19 0705 11/16/19 0638 11/17/19 0440  NA 129* 132* 134* 132*  K 3.8 3.7 3.4* 3.1*  CL  98 98 101 100  CO2 26 27 26 24   GLUCOSE 103* 86 82 82  BUN 11 7 5* 8  CALCIUM 7.7* 8.1* 7.8* 7.6*  CREATININE <0.30* <0.30* <0.30* <0.30*  GFRNONAA NOT CALCULATED NOT CALCULATED NOT CALCULATED NOT CALCULATED  GFRAA NOT CALCULATED NOT CALCULATED NOT CALCULATED NOT CALCULATED    LIVER FUNCTION TESTS: Recent Labs    11/02/19 0521 11/04/19 0136 11/05/19 0445 11/10/19 0415  BILITOT 0.7 0.7 0.7 0.6  AST 88* 48* 21 11*  ALT 135* 238* 127* 23  ALKPHOS 150* 159* 126 112  PROT 4.3* 4.4* 4.4* 4.6*  ALBUMIN 1.4* 1.6* 1.9* 1.7*    Assessment and Plan:  History of diverticulitis s/p sigmoid colectomy in 2013 who presented with diffuse colitis with large perforation in the region of prior rectosigmoid anastomosis s/p exploratory laparotomy with Harthman's procedure in OR 10/24/2019 by Dr. Kae Heller; complicated by fascial dehiscence, mesenteric hematoma, pelvic abscess, and mucocutaneous separation of colostomy s/p exploratory laparotomy for colostomy revision with drain placement x1 and wound vac placement in OR 11/04/2019 by Dr. Georgette Dover; further complicated by development of intra-abdominal fluid collection s/p RLQ drain placement in IR 11/11/2019 by Dr. Kathlene Cote  RLQ drain in place with output consistent with liquefied hematoma.  Continue current drain care for now. IR will continue to follow.   Electronically Signed: Soyla Dryer, AGACNP-BC [phone] 11/18/2019, 3:49 PM   I spent a total of 15 Minutes at the the patient's bedside AND on the patient's hospital floor or unit, greater than 50% of which was counseling/coordinating care for right abdominal drain.

## 2019-11-19 LAB — HEPATIC FUNCTION PANEL
ALT: 11 U/L (ref 0–44)
AST: 10 U/L — ABNORMAL LOW (ref 15–41)
Albumin: 1.5 g/dL — ABNORMAL LOW (ref 3.5–5.0)
Alkaline Phosphatase: 76 U/L (ref 38–126)
Bilirubin, Direct: <0.1 mg/dL (ref 0.0–0.2)
Total Bilirubin: 0.3 mg/dL (ref 0.3–1.2)
Total Protein: 4.8 g/dL — ABNORMAL LOW (ref 6.5–8.1)

## 2019-11-19 LAB — CBC
HCT: 27.2 % — ABNORMAL LOW (ref 36.0–46.0)
Hemoglobin: 8.4 g/dL — ABNORMAL LOW (ref 12.0–15.0)
MCH: 31.6 pg (ref 26.0–34.0)
MCHC: 30.9 g/dL (ref 30.0–36.0)
MCV: 102.3 fL — ABNORMAL HIGH (ref 80.0–100.0)
Platelets: 426 10*3/uL — ABNORMAL HIGH (ref 150–400)
RBC: 2.66 MIL/uL — ABNORMAL LOW (ref 3.87–5.11)
RDW: 19.3 % — ABNORMAL HIGH (ref 11.5–15.5)
WBC: 5.8 10*3/uL (ref 4.0–10.5)
nRBC: 0 % (ref 0.0–0.2)

## 2019-11-19 LAB — BASIC METABOLIC PANEL
Anion gap: 8 (ref 5–15)
BUN: 8 mg/dL (ref 6–20)
CO2: 26 mmol/L (ref 22–32)
Calcium: 8.1 mg/dL — ABNORMAL LOW (ref 8.9–10.3)
Chloride: 101 mmol/L (ref 98–111)
Creatinine, Ser: 0.32 mg/dL — ABNORMAL LOW (ref 0.44–1.00)
GFR calc Af Amer: >60 mL/min (ref >60)
GFR calc non Af Amer: >60 mL/min (ref >60)
Glucose, Bld: 87 mg/dL (ref 70–99)
Potassium: 3 mmol/L — ABNORMAL LOW (ref 3.5–5.1)
Sodium: 135 mmol/L (ref 135–145)

## 2019-11-19 MED ORDER — POTASSIUM CHLORIDE 20 MEQ PO PACK
40.0000 meq | PACK | Freq: Once | ORAL | Status: AC
  Administered 2019-11-19: 40 meq via ORAL
  Filled 2019-11-19: qty 2

## 2019-11-19 MED ORDER — POTASSIUM CHLORIDE 10 MEQ/50ML IV SOLN
10.0000 meq | INTRAVENOUS | Status: AC
  Administered 2019-11-19 (×6): 10 meq via INTRAVENOUS
  Filled 2019-11-19 (×6): qty 50

## 2019-11-19 MED ORDER — ADULT MULTIVITAMIN W/MINERALS CH
1.0000 | ORAL_TABLET | Freq: Every day | ORAL | Status: DC
  Administered 2019-11-19 – 2019-11-23 (×4): 1 via ORAL
  Filled 2019-11-19 (×4): qty 1

## 2019-11-19 NOTE — H&P (Addendum)
Physical Medicine and Rehabilitation Admission H&P    Chief Complaint  Patient presents with  . Foot Swelling  : HPI: Natasha Barrera is a 54 year old right-handed female with history of hypertension, diverticulitis status post sigmoid colectomy 2013, ventral hernia repair, tobacco use.  Patient with recent admission 10/02/2019 to 10/04/2019 for complaints of abdominal discomfort diarrhea and weight loss found to be hypotensive with electrolyte derangements.  CT abdomen pelvis at that time revealed diffuse colitis involving the ascending, transverse and descending colon.  GI was consulted recommendations inpatient colostomy and stool studies.  Patient and her husband refused to stay for complete work-up and left AMA.  Since her discharge of 10/04/2019 continued experiencing worsening fatigue poor appetite loose stools as well as some swelling lower extremities.  Patient presented back 10/18/2019 with increasing abdominal pain.  Blood pressure 97/65 hemoglobin 10.4 platelet 593 sodium 123 chloride 86 potassium 3.5 total protein 4.4, total bilirubin 1.3.  CTA unremarkable.  GI services consulted Dr. Henrene Pastor underwent colonoscopy/upper GI endoscopy 10/21/2019 that showed the terminal ileum appeared normal.  There was scarring and mucosal bridging as well as scattered areas of ulceration throughout the entire colon.  There was evidence of prior sigmoid colectomy and a few diverticula.  EGD was unremarkable.  Findings favored Crohn's colitis.  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  Kae Heller.  Postoperative course complicated by hypotension with follow-up for critical care maintained on IV hydration as well as Levophed.  She did require IV heparin for thrombosis and she did develop subsequent large mesenteric hematoma 10/30/2019 and IV heparin discontinued.  On 11/04/2019 she had facial dehiscence with visible transverse colon underwent exploratory laparotomy colostomy revision, fascial closure, placement of 4 retention sutures and wound VAC along with right JP drain placement.  As of 11/11/2019 findings of right abdominal abscess status post IR drain per Dr.Yamagata As noted cultures Klebsiella and Pseudomonas and initially maintained on Zosyn switched to Cipro 11/18/2019.  Latest CT of the abdomen 11/21/2019 showed complete resolution of right mid abdominal abscess after pigtail drainage catheter placement.  No recurrence or residual fluid collection.  Patient undergoing twice daily wet-to-dry dressing changes.  She was cleared to begin subcutaneous Lovenox for DVT prophylaxis.  Patient with slow progressive gains acute blood loss anemia latest hemoglobin 8.4, sodium 135.  Therapy evaluations completed and patient was admitted for a comprehensive rehab program  Review of Systems  Constitutional: Positive for fever, malaise/fatigue and weight loss.  HENT: Negative for hearing loss.   Eyes: Negative for blurred vision and double vision.  Respiratory: Negative for cough and shortness of breath.   Cardiovascular: Positive for leg swelling. Negative for chest pain and palpitations.  Gastrointestinal: Negative for abdominal pain, diarrhea, heartburn, nausea and vomiting.  Genitourinary: Negative for dysuria, flank pain and hematuria.  Musculoskeletal: Positive for myalgias.  Skin: Negative for rash.  Psychiatric/Behavioral: Negative for depression. The patient does not have insomnia.   All other systems reviewed and are negative.  Past Medical History:  Diagnosis Date  . Diverticul disease  small and large intestine, no perforati or abscess   . Diverticulitis of colon   .  Hypertension    Past Surgical History:  Procedure Laterality Date  . APPLICATION OF WOUND VAC N/A 11/04/2019   Procedure: APPLICATION OF WOUND VAC;  Surgeon: Donnie Mesa, MD;  Location: [facility];  Service: General;  Laterality: N/A;  . BIOPSY  10/21/2019   Procedure: BIOPSY;  Surgeon: Irene Shipper, MD;  Location: [facility] ENDOSCOPY;  Service: Endoscopy;;  . COLON SURGERY  12/25/2011  . COLONOSCOPY    . COLONOSCOPY WITH PROPOFOL N/A 10/21/2019   Procedure: COLONOSCOPY WITH PROPOFOL;  Surgeon: Irene Shipper, MD;  Location: [facility] ENDOSCOPY;  Service: Endoscopy;  Laterality: N/A;  . COLOSTOMY Left 10/24/2019   Procedure: COLOSTOMY;  Surgeon: Clovis Riley, MD;  Location: [place];  Service: General;  Laterality: Left;  . ESOPHAGOGASTRODUODENOSCOPY (EGD) WITH PROPOFOL N/A 10/21/2019   Procedure: ESOPHAGOGASTRODUODENOSCOPY (EGD) WITH PROPOFOL;  Surgeon: Irene Shipper, MD;  Location: [facility] ENDOSCOPY;  Service: Endoscopy;  Laterality: N/A;  with small bowel BX's  . [place]  2002  . LAPAROTOMY N/A 10/24/2019   Procedure: EXPLORATORY LAPAROTOMY;  Surgeon: Clovis Riley, MD;  Location: [place];  Service: General;  Laterality: N/A;  . LAPAROTOMY N/A 11/04/2019   Procedure: EXPLORATORY LAPAROTOMY FOR DEHISCENCE;  Surgeon: Donnie Mesa, MD;  Location: [place];  Service: General;  Laterality: N/A;  . VENTRAL HERNIA REPAIR N/A 12/05/2018   Procedure: LAPAROSCOPIC VENTRAL HERNIA REPAIR WITH MESH;  Surgeon: Jovita Kussmaul, MD;  Location: [facility] OR;  Service: General;  Laterality: N/A;   Family History  Problem Relation Age of Onset  . Breast cancer Neg Hx    Social History:  reports that she has quit smoking. She has never used smokeless tobacco. She reports current alcohol use. She reports that she does not use drugs. Allergies: No Known Allergies Medications Prior to Admission  Medication Sig Dispense Refill  . Calcium  Carb-Cholecalciferol (CALCIUM+D3 PO) Take 1 tablet by mouth daily.    . Cholecalciferol (VITAMIN D-3) 125 MCG (5000 UT) TABS Take 5,000 Units by mouth daily.    Marland Kitchen loperamide (IMODIUM A-D) 2 MG tablet Take 1 tablet (2 mg total) by mouth 4 (four) times daily as needed for diarrhea or loose stools. 30 tablet 0  . Multiple Vitamin (MULTIVITAMIN WITH MINERALS) TABS tablet Take 1 tablet by mouth daily. Centrum Silver      Drug Regimen Review Drug regimen was reviewed and remains appropriate with no significant issues identified  Home: Home Living Family/patient expects to be discharged to:: Private residence Living Arrangements: Spouse/significant other, Children Available Help at Discharge: Family Type of Home: House Home Access: Stairs to enter Technical brewer of Steps: couple Home Layout: One level Bathroom Shower/Tub: Multimedia programmer: Standard Home Equipment: None Additional Comments: Pt reports spouse works from home    Functional History: Prior Function Level of Independence: Independent Comments: Pt reports she likes to clean and "do all kinds of stuff".  She does not work   Functional Status:  Mobility: Bed Mobility Overal bed mobility: Needs Assistance Bed Mobility: Supine to Sit, Sit to Supine Rolling: Supervision Sidelying to sit: Min assist, HOB elevated Supine to sit: Supervision Sit to supine: Min guard Sit to sidelying: Mod assist, +2 for safety/equipment General bed mobility comments: pt using bed rail to roll toward R side then reaching for therapist with L UE to pull self up into static sitting. pt reports little dizziness. pt allowed prolonged sitting ~45 second with symptoms resolving   Transfers Overall transfer level: Needs assistance Equipment used: Rolling  walker (2 wheeled), 1 person hand held assist Transfer via Lift Equipment: Stedy Transfers: Sit to/from Stand Sit to Stand: Mod assist Stand pivot transfers: Min assist Squat  pivot transfers: Max assist General transfer comment: 1st attempt with use of RW pt unable to complete stand with PT assist. PT then provides L knee block for stand attempts 2-4 and R knee block for attempt 5. Pt requires assist to power up and verbal cues to extend trunk. PT assists pt in pre-gait weight shifting exercise with one instance of R knee buckling and multiple losses of balance Ambulation/Gait Ambulation/Gait assistance: Min assist Gait Distance (Feet): 3 Feet Assistive device: Rolling walker (2 wheeled) Gait Pattern/deviations: Step-to pattern General Gait Details: unable    ADL: ADL Overall ADL's : Needs assistance/impaired Eating/Feeding: Set up, Bed level Grooming: Set up, Sitting Grooming Details (indicate cue type and reason): setup/S at EOB to wash face, max A to comb/brush hair (tangles and fatigued easily) in recliner Upper Body Bathing: Moderate assistance, Sitting Lower Body Bathing: Maximal assistance, Sit to/from stand Upper Body Dressing : Moderate assistance, Sitting Lower Body Dressing: Moderate assistance, Bed level Lower Body Dressing Details (indicate cue type and reason): pt using bil UE to pull leg up and OT to (A) with figure 4. pt able to loop sock over toes and pull on socks Toilet Transfer: Maximal assistance Toilet Transfer Details (indicate cue type and reason): simulated sit<>Stand from bed with stedy Toileting- Clothing Manipulation and Hygiene: Total assistance Toileting - Clothing Manipulation Details (indicate cue type and reason): colostomy and purewick  Functional mobility during ADLs: Moderate assistance, Cueing for safety, Cueing for sequencing General ADL Comments: pt completed transfer from supine to sit then sit<>Stand x4 in stedy this session  Cognition: Cognition Overall Cognitive Status: Within Functional Limits for tasks assessed Orientation Level: Oriented X4 Cognition Arousal/Alertness: Awake/alert Behavior During Therapy: WFL  for tasks assessed/performed Overall Cognitive Status: Within Functional Limits for tasks assessed Area of Impairment: Problem solving, Awareness, Safety/judgement Following Commands: Follows one step commands with increased time Safety/Judgement: Decreased awareness of deficits, Decreased awareness of safety Awareness: Intellectual Problem Solving: Requires verbal cues General Comments: Pt requires frequent and repeated cuing for breathing technique and mobility, pt limited by anxiety. Pt states "I think I am going to have a panic attack" sitting EOB, improved with slowed breathing and rest.  Physical Exam: Blood pressure 98/61, pulse 74, temperature 98.4 F (36.9 C), temperature source Oral, resp. rate 18, height 5' 1"  (1.549 m), weight 50.6 kg, last menstrual period 12/15/2014, SpO2 97 %. General: Alert and oriented x 3, No apparent distress HEENT: Head is normocephalic, atraumatic, PERRLA, EOMI, sclera anicteric, oral mucosa pink and moist, dentition intact, ext ear canals clear,  Neck: Supple without JVD or lymphadenopathy Heart: Reg rate and rhythm. No murmurs rubs or gallops Chest: CTA bilaterally without wheezes, rales, or rhonchi; no distress Abdomen:  Abdominal wound with colostomy in place as well as retention sutures.  JP drain.  Wet-to-dry dressing  Extremities: No clubbing, cyanosis, or edema. Pulses are 2+ Skin: Left arm bruising Neuro: Patient is alert.  Complaints of abdominal pain oriented x3 and follows commands. 4/5 strength in BUE and 3/5 strength in BLE  Psych: Pt's affect is appropriate. Pt is cooperative. Situational depression   No results found for this or any previous visit (from the past 48 hour(s)). No results found.     Medical Problem List and Plan: 1.  Debility secondary to Crohn's disease/diverticulitis status post exploratory laparotomy with Hartman's  procedure 8/93/8101 complicated by fascial dehiscence mesenteric hematoma pelvic abscess and  mucocutaneous separation of colostomy status post exploratory laparotomy colostomy revision drain placement and wound VAC 10/05/1023 further complicated by development of intra-abdominal fluid collection status post right lower quadrant drain placement 11/11/2019 per interventional radiology Dr. Kathlene Cote.  Continue prednisone 20 mg daily for Crohn's disease  -patient may shower (JP drains must be covered). Eager to take a shower  -ELOS/Goals: min A in 10-14 days. 2.  Antithrombotics: -DVT/anticoagulation: Lovenox  -antiplatelet therapy: N/A 3. Pain Management: Neurontin 200 mg twice daily, Lidoderm patch as directed, Robaxin 1000 mg every 8 hours, tramadol as needed 4. Mood: Lexapro 10 mg daily, melatonin 3 mg nightly as needed  -antipsychotic agents: N/A 5. Neuropsych: This patient is capable of making decisions on her own behalf. 6. Skin/Wound Care: Routine skin checks 7. Fluids/Electrolytes/Nutrition: Routine in and outs with follow-up chemistries 8.  ID/intra-abdominal abscess.  Currently maintained on Cipro 500 mg twice daily 9.  Acute blood loss anemia.  Hgb 8.6 today. Follow-up CBC 10.  Hypotension.  Monitor with increased mobility. Decreasing K+ may help BP increase. Continue TED stockings and abdominal binder during therapy sessions- she has been feeling dizzy. Daily orthostatic vital signs. She would prefer to take Tramadol after therapy so it does not drop her BP as much- advised that this would be ok, it is ordered as PRN.  11. Hypokalemia: 4.3 on 8/23 and has been normal last 4 reads. Decrease K+ supplement to 59mq daily and repeat K+ tomorrow.   DLavon PaganiniAngiulli, PA-C 11/19/2019   I have personally performed a face to face diagnostic evaluation, including, but not limited to relevant history and physical exam findings, of this patient and developed relevant assessment and plan.  Additionally, I have reviewed and concur with the physician assistant's documentation above.  KLeeroy Cha MD

## 2019-11-19 NOTE — Progress Notes (Signed)
Subjective: Interviewed patient at bedside.  She reports that she is doing good today. She reports trying eat and drink more but states that she still has altered taste.   Objective:  Vital signs in last 24 hours: Vitals:   11/18/19 1550 11/18/19 2005 11/18/19 2356 11/19/19 0412  BP: 109/65 96/61 104/62 98/61  Pulse: 77 74 79 74  Resp: 15 15 20 18   Temp: 97.8 F (36.6 C) 98.3 F (36.8 C) (!) 97.4 F (36.3 C) 98.4 F (36.9 C)  TempSrc: Oral Oral Oral Oral  SpO2: 99% 99% 99% 97%  Weight:      Height:       Physical Exam Vitals and nursing note reviewed.  Constitutional:      General: She is not in acute distress.    Appearance: Normal appearance. She is not ill-appearing or toxic-appearing.  HENT:     Head: Normocephalic and atraumatic.  Cardiovascular:     Rate and Rhythm: Normal rate and regular rhythm.     Pulses: Normal pulses.          Radial pulses are 2+ on the right side and 2+ on the left side.       Dorsalis pedis pulses are 2+ on the right side and 2+ on the left side.     Heart sounds: Normal heart sounds, S1 normal and S2 normal. No murmur heard.   Pulmonary:     Effort: Pulmonary effort is normal. No respiratory distress.     Breath sounds: Normal breath sounds. No wheezing.  Abdominal:     General: There is no distension.     Palpations: Abdomen is soft. There is no mass.     Tenderness: There is no abdominal tenderness.  Musculoskeletal:     Right lower leg: No edema.     Left lower leg: No edema.  Neurological:     Mental Status: She is alert. Mental status is at baseline.  Psychiatric:        Mood and Affect: Mood normal.        Behavior: Behavior normal.        Thought Content: Thought content normal.      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   Crohn's Flare/Sigmoid Colon perforation:S/Pcolectomy on 7/24.S/P exploratory [place] morning8/5.Ostomy bag still has decentbrown liquidand gasoutput today.Reports no irritation or pain around Ostomy. Will continue to monitor. -Continue advancing diet as able -Continue dressing changes on wound  -Continue on prednisone 20 mg daily -Continue to monitor for flare of diarrhea or hematochezia    Acute Hypoxia:Currently onroom air.Reportsminimalmucus.Reports breathing much improved. -Holdlasix   HypovolemicHyponatremia(resolved):TodayNa is135.Still Euvolemic on exam. Continuing to encourage eating and drinking as much as possible. -HoldIV fluids today -Holdlasix   Right peritoneal hematoma:Hemoglobintoday is8.4.Drain still in place, being emptied less frequently. Completed 6 days of IV Zosyn 3.375 g q8 on 8/17. -Continue Ciprofloxacin 500 mg BID (Day 2) -Will continue to monitor output in drains   Adjustment Disorder with Depressed Mood:Has had significant change in mood  since starting medication.Mood much improved.Willcontinue tomonitor response. -ContinueEscitalopram 10 mg  -Continue tomonitor for Serotonin Syndrome due to concurrent Tramadol use   Severe protein calorie malnutrition:Albuminwas 1.5today.Currently onsoft diet, will continue to advance diet as tolerated.Encouraged eating as much as tolerated. K is 3.0 repletion ordered. -Kdur 40 mEq -Continually encouragingeating and drinking as much as possible   Left Heel:No skin breakdown present.Erythema has resolved.States shegotup to  the room chair the past couple days. Encourage to conitnue to get up to the chair. Encouraged working with PT/OT as much as possible.  -Encouraged getting out of bed and sitting in chair -Encouraged working with PT/OT as much as possible -Daily checks   DVT prophx: SCDsandlovenoxsubcutaneous Diet:Soft diet JDB:ZMCE   Prior to Admission Living Arrangement: Home Anticipated Discharge Location: CIR vs Home Barriers to Discharge: Continued drain output Dispo: Anticipated discharge in approximately 2-5 day(s).   Briant Cedar, MD 11/19/2019, 6:09 AM Pager: [phone] After 5pm on weekdays and 1pm on weekends: On Call pager [phone]

## 2019-11-19 NOTE — Progress Notes (Addendum)
[facility] Progress Note  15 Days Post-Op  Subjective: CC-  Feeling good today. Doing well with PO, and planning to do som exercises on her own today  Objective: Vital signs in last 24 hours: Temp:  [97.4 F (36.3 C)-98.7 F (37.1 C)] 98.7 F (37.1 C) (08/19 0848) Pulse Rate:  [74-83] 76 (08/19 0848) Resp:  [13-20] 13 (08/19 0848) BP: (91-109)/(53-65) 91/53 (08/19 0848) SpO2:  [97 %-99 %] 99 % (08/19 0848) Weight:  [50.6 kg] 50.6 kg (08/19 0500) Last BM Date: 11/18/19  Intake/Output from previous day: 08/18 0701 - 08/19 0700 In: 560 [P.O.:350; I.V.:10] Out: 652 [Urine:400; Drains:102; Stool:150] Intake/Output this shift: No intake/output data recorded.  PE: Gen: Alert, NAD, pleasant Pulm: rate and effort normalon room air Abd: Soft,ND,nontender, IR drain with malodorous sanguinopurulent fluid,JPdrain withgreen tinged purulent fluid,ostomywith gas/liquid stool in pouch,midline incisionwith retention sutures in place and early granulation, minimal serous drainage     Lab Results:  Recent Labs    11/17/19 0440 11/19/19 0500  WBC 6.2 5.8  HGB 8.7* 8.4*  HCT 27.2* 27.2*  PLT 396 426*   BMET Recent Labs    11/17/19 0440 11/19/19 0500  NA 132* 135  K 3.1* 3.0*  CL 100 101  CO2 24 26  GLUCOSE 82 87  BUN 8 8  CREATININE <0.30* 0.32*  CALCIUM 7.6* 8.1*   PT/INR No results for input(s): LABPROT, INR in the last 72 hours. CMP     Component Value Date/Time   NA 135 11/19/2019 0500   K 3.0 (L) 11/19/2019 0500   CL 101 11/19/2019 0500   CO2 26 11/19/2019 0500   GLUCOSE 87 11/19/2019 0500   BUN 8 11/19/2019 0500   CREATININE 0.32 (L) 11/19/2019 0500   CALCIUM 8.1 (L) 11/19/2019 0500   PROT 4.8 (L) 11/19/2019 0500   ALBUMIN 1.5 (L) 11/19/2019 0500   AST 10 (L) 11/19/2019 0500   ALT 11 11/19/2019 0500   ALKPHOS 76 11/19/2019 0500   BILITOT 0.3 11/19/2019 0500   GFRNONAA >60 11/19/2019 0500   GFRAA >60 11/19/2019 0500   Lipase      Component Value Date/Time   LIPASE 18 09/19/2019 1631       Studies/Results: No results found.  Anti-infectives: Anti-infectives (From admission, onward)   Start     Dose/Rate Route Frequency Ordered Stop   11/18/19 1000  ciprofloxacin (CIPRO) tablet 500 mg        500 mg Oral 2 times daily 11/18/19 0840     11/11/19 1100  piperacillin-tazobactam (ZOSYN) IVPB 3.375 g  Status:  Discontinued        3.375 g 12.5 mL/hr over 240 Minutes Intravenous Every 8 hours 11/11/19 0945 11/18/19 0840   11/06/19 0600  ceFAZolin (ANCEF) IVPB 2g/100 mL premix  Status:  Discontinued        2 g 200 mL/hr over 30 Minutes Intravenous Every 8 hours 11/05/19 0914 11/08/19 1133   11/04/19 0600  cefTRIAXone (ROCEPHIN) 2 g in sodium chloride 0.9 % 100 mL IVPB  Status:  Discontinued        2 g 200 mL/hr over 30 Minutes Intravenous Every 24 hours 11/04/19 0500 11/05/19 0914   10/31/19 1700  vancomycin (VANCOCIN) IVPB 1000 mg/200 mL premix  Status:  Discontinued        1,000 mg 200 mL/hr over 60 Minutes Intravenous Every 12 hours 10/31/19 0742 11/01/19 1032   10/31/19 1000  vancomycin (VANCOREADY) IVPB 750 mg/150 mL  Status:  Discontinued  750 mg 150 mL/hr over 60 Minutes Intravenous Every 12 hours 10/31/19 0057 10/31/19 0742   10/31/19 0900  meropenem (MERREM) 2 g in sodium chloride 0.9 % 100 mL IVPB  Status:  Discontinued        2 g 200 mL/hr over 30 Minutes Intravenous Every 8 hours 10/31/19 0742 11/01/19 1032   10/31/19 0200  vancomycin (VANCOREADY) IVPB 1500 mg/300 mL        1,500 mg 150 mL/hr over 120 Minutes Intravenous  Once 10/31/19 0057 10/31/19 0700   10/31/19 0200  piperacillin-tazobactam (ZOSYN) IVPB 3.375 g  Status:  Discontinued        3.375 g 12.5 mL/hr over 240 Minutes Intravenous Every 8 hours 10/31/19 0057 10/31/19 0726   10/24/19 0600  piperacillin-tazobactam (ZOSYN) IVPB 3.375 g  Status:  Discontinued        3.375 g 12.5 mL/hr over 240 Minutes Intravenous Every 8 hours  10/23/19 2305 10/29/19 0932   10/24/19 0000  piperacillin-tazobactam (ZOSYN) IVPB 4.5 g  Status:  Discontinued        4.5 g 200 mL/hr over 30 Minutes Intravenous Every 6 hours 10/23/19 2257 10/23/19 2302   10/23/19 2315  piperacillin-tazobactam (ZOSYN) IVPB 3.375 g        3.375 g 100 mL/hr over 30 Minutes Intravenous STAT 10/23/19 2302 10/23/19 2347       Assessment/Plan 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 Henrene Pastor, per GI, started Solumedrol 7/27;GI has decreased steroid, on PO prednisone, will not plan to start biologics until infection is under control and surgical wound is completely healed  Sigmoid colon perforation-  - 10/24/19:partial colectomy and end colostomy by Dr. Kae Heller.  - 10/30/19: large mesenteric hematoma noted, hep gtt held - 11/04/19:fascial dehiscence with visible transverse colon: S/P exploratory laparotomy, colostomy revision, fascial closure, placement 4 retention sutures, placement of wound VAC, Dr. Georgette Dover - 8/11: right abdominal abscess, s/p IR drain, culture KLEBSIELLA and PSEUDOMONAS AERUGINOSA, switched from zosyn to cipro 8/18 - BID wet to dry dressing changes -continue surgical JP drain - monitor closely, newly purulent/green tinged on 8/16 -tolerating soft diet and ostomy functioning Protein calorie malnutrition - prealbumin(8/2)14.2, off TPN Acute on chronic anemia- s/p 3u pRBC 7/30-8/1,Hgb8.7 (8/17), stable FEN: Ensure,soft diet, hypokalemia- replace IV and PO ID: [id number];Ancef 8/6 for bacteremia (1/2 blood Cx 7/31 showed E.Coli)>>8/8, zosyn 8/11>8/18, cipro 8/18>> VTE: SCD's, lovenox Foley:out 8/6  Plan:Continue drains. Continue mobilizing. Continue wound care. Her goal is home with home health; I would like to see her a little stronger before this. Will continue to follow.    LOS: 31  days    [facility] 11/19/2019, 9:05 AM Please see Amion for pager number during day hours 7:00am-4:30pm

## 2019-11-20 LAB — BASIC METABOLIC PANEL
Anion gap: 4 — ABNORMAL LOW (ref 5–15)
BUN: 5 mg/dL — ABNORMAL LOW (ref 6–20)
CO2: 25 mmol/L (ref 22–32)
Calcium: 7.9 mg/dL — ABNORMAL LOW (ref 8.9–10.3)
Chloride: 101 mmol/L (ref 98–111)
Creatinine, Ser: <0.3 mg/dL — ABNORMAL LOW (ref 0.44–1.00)
Glucose, Bld: 86 mg/dL (ref 70–99)
Potassium: 3.7 mmol/L (ref 3.5–5.1)
Sodium: 130 mmol/L — ABNORMAL LOW (ref 135–145)

## 2019-11-20 LAB — CBC
HCT: 29.1 % — ABNORMAL LOW (ref 36.0–46.0)
Hemoglobin: 9 g/dL — ABNORMAL LOW (ref 12.0–15.0)
MCH: 31.7 pg (ref 26.0–34.0)
MCHC: 30.9 g/dL (ref 30.0–36.0)
MCV: 102.5 fL — ABNORMAL HIGH (ref 80.0–100.0)
Platelets: 484 10*3/uL — ABNORMAL HIGH (ref 150–400)
RBC: 2.84 MIL/uL — ABNORMAL LOW (ref 3.87–5.11)
RDW: 19.1 % — ABNORMAL HIGH (ref 11.5–15.5)
WBC: 6.9 10*3/uL (ref 4.0–10.5)
nRBC: 0 % (ref 0.0–0.2)

## 2019-11-20 LAB — MAGNESIUM: Magnesium: 1.9 mg/dL (ref 1.7–2.4)

## 2019-11-20 LAB — PHOSPHORUS: Phosphorus: 3.3 mg/dL (ref 2.5–4.6)

## 2019-11-20 MED ORDER — SODIUM CHLORIDE 0.9 % IV SOLN: INTRAVENOUS | Status: AC

## 2019-11-20 MED ORDER — POTASSIUM CHLORIDE 20 MEQ PO PACK
40.0000 meq | PACK | Freq: Once | ORAL | Status: AC
  Administered 2019-11-20: 40 meq via ORAL
  Filled 2019-11-20: qty 2

## 2019-11-20 NOTE — Progress Notes (Signed)
Inpatient Rehab Admissions Coordinator Note:   I spoke with patient again regarding her rationale for refusing CIR bed. She states she wants to go home by the end of next week. Explained that if she stays on acute floor, she will likely get therapy 3 days a week, while if she comes to CIR during that same timeframe, our doctors can continue to manage her medical issues but she would get 15-18 hours of therapy in that same timeframe. Pt. Stated that she would like to come but wants to talk to her family before giving a definite yes. She also wants MD to confirm that her recent CT is clear and that she is stable for transfer to CIR. Will follow up with this patient if I have a bed available tomorrow AM.   Clemens Catholic, [place], [place] Admissions Coordinator  [phone] (celll) [phone] (office)

## 2019-11-20 NOTE — Progress Notes (Signed)
Occupational Therapy Treatment Patient Details Name: Natasha Barrera MRN: [id number] DOB: 1966/01/10 Today's Date: 11/20/2019    History of present illness Almadelia Tineo is a 54 year old female with PMH significant for HTN and diverticulitis (s/p sigmoid colectomy 2015) who originally presented with complaints of LE edema with later reports of abdominal pain and decreased appetite.  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 s/p exp laparatomy for dehiscence, colostomy revision, and wound vac placement 8/5.   OT comments  Pt demonstrates sit<>Stand stedy x2 max (A) to elevate from chair height for peri care. Pt provided medbridge access code [place]. Pt remains appropriate for CIR.    Follow Up Recommendations  CIR;Supervision/Assistance - 24 hour    Equipment Recommendations  Other (comment)    Recommendations for Other Services Rehab consult    Precautions / Restrictions Precautions Precautions: Fall;Other (comment) Precaution Comments: Jp drain x2 Right side/ ostomy L side / abdomen binder when up Required Braces or Orthoses: Other Brace Other Brace: abdominal binder when OOB       Mobility Bed Mobility Overal bed mobility: Needs Assistance Bed Mobility: Supine to Sit     Supine to sit: Min assist;HOB elevated     General bed mobility comments: oob in chair on arrival  Transfers Overall transfer level: Needs assistance   Transfers: Sit to/from Stand Sit to Stand: Mod assist        Lateral/Scoot Transfers: Mod assist General transfer comment: pt requires therapist to help anterior lift patient into the stedy. pt pulling up and initiating the task    Balance Overall balance assessment: Needs assistance Sitting-balance support: No upper  extremity supported;Feet supported Sitting balance-Leahy Scale: Fair                                     ADL either performed or assessed with clinical judgement   ADL Overall ADL's : Needs assistance/impaired Eating/Feeding: Modified independent;Sitting Eating/Feeding Details (indicate cue type and reason): drinking ensure at the end of session Grooming: Wash/dry hands;Wash/dry face;Set up;Sitting   Upper Body Bathing: Set up;Sitting Upper Body Bathing Details (indicate cue type and reason): pt requires pause between sides due to decr endurance Lower Body Bathing: Moderate assistance;Sit to/from stand Lower Body Bathing Details (indicate cue type and reason): pt able to use bil UE to bend LE into knee flexion to wash leg but requires one UE to hold the leg due to weakness. Pt able to complete anterior peri care supine. Pt sit<>Stand in stedy x2 for posterior peri care and barrier cream application. pt sustained 1 minute static stand. pt able to reach with L UE to pull on mesh underwear Upper Body Dressing : Minimal assistance;Sitting Upper Body Dressing Details (indicate cue type and reason): due to drains and abdomen binder requires increased (A) at the moment                         Vision       Perception     Praxis      Cognition Arousal/Alertness: Awake/alert Behavior During Therapy: WFL for tasks assessed/performed Overall Cognitive Status: Impaired/Different from baseline Area of Impairment: Memory  Memory: Decreased short-term memory         General Comments: pt with poor recall of HEP for UB provided previous session. pt provided medibridge access code        Exercises General Exercises - Lower Extremity Ankle Circles/Pumps: AROM;Both;10 reps Other Exercises Other Exercises: provided medbridge handout and helped download and access on iphone. pt with orange and green theraband located in room and put with  patient Other Exercises: medbridge access code [facility]   Shoulder Instructions       General Comments jp drain attached with safety pin to the gown    Pertinent Vitals/ Pain       Pain Assessment: Faces Faces Pain Scale: Hurts a little bit Pain Location: R knee Pain Descriptors / Indicators: Grimacing Pain Intervention(s): Monitored during session;Repositioned;Heat applied  Home Living                                          Prior Functioning/Environment              Frequency  Min 2X/week        Progress Toward Goals  OT Goals(current goals can now be found in the care plan section)  Progress towards OT goals: Progressing toward goals  Acute Rehab OT Goals Patient Stated Goal: home OT Goal Formulation: With patient Time For Goal Achievement: 12/02/19 Potential to Achieve Goals: Good ADL Goals Pt Will Perform Grooming: with modified independence;sitting Pt Will Perform Upper Body Bathing: with set-up;sitting Pt Will Perform Lower Body Bathing: bed level;with min assist Pt Will Perform Upper Body Dressing: with modified independence;sitting Pt Will Perform Lower Body Dressing: with min assist;bed level Pt Will Transfer to Toilet: with mod assist;bedside commode Pt Will Perform Toileting - Clothing Manipulation and hygiene: with min guard assist  Plan Discharge plan remains appropriate    Co-evaluation                 AM-PAC OT "6 Clicks" Daily Activity     Outcome Measure   Help from another person eating meals?: A Little Help from another person taking care of personal grooming?: A Lot Help from another person toileting, which includes using toliet, bedpan, or urinal?: A Lot Help from another person bathing (including washing, rinsing, drying)?: A Lot Help from another person to put on and taking off regular upper body clothing?: A Lot Help from another person to put on and taking off regular lower body clothing?: A Lot 6  Click Score: 13    End of Session    OT Visit Diagnosis: Unsteadiness on feet (R26.81);Muscle weakness (generalized) (M62.81);Pain   Activity Tolerance Patient tolerated treatment well   Patient Left in chair;with call bell/phone within reach   Nurse Communication Mobility status;Precautions        Time: 1010-1043 OT Time Calculation (min): 33 min  Charges: OT General Charges $OT Visit: 1 Visit OT Treatments $Self Care/Home Management : 23-37 mins   Brynn, OTR/L  Acute Rehabilitation Services Pager: [phone] Office: [phone] .    Jeri Modena 11/20/2019, 10:58 AM

## 2019-11-20 NOTE — Progress Notes (Signed)
Subjective: Interviewed patient at bedside.  She reports doing well, sitting up in chair waiting for PT. Discussed plan to talk with Surgery for disposition.  She reports her ostomy site is somewhat sore and leaking some but otherwise good.  Objective:  Vital signs in last 24 hours: Vitals:   11/19/19 1538 11/19/19 1956 11/19/19 2336 11/20/19 0350  BP:  (!) 90/58 108/64 (!) 96/59  Pulse:  72 66 69  Resp:  18 20 20   Temp: 98.3 F (36.8 C) (!) 97.4 F (36.3 C) (!) 97.5 F (36.4 C) 98.1 F (36.7 C)  TempSrc: Oral Oral Oral Oral  SpO2:  100% 99% 98%  Weight:      Height:       Physical Exam Vitals and nursing note reviewed.  Constitutional:      General: She is not in acute distress.    Appearance: Normal appearance. She is not ill-appearing or toxic-appearing.  HENT:     Head: Normocephalic and atraumatic.  Cardiovascular:     Rate and Rhythm: Normal rate and regular rhythm.     Pulses: Normal pulses.          Radial pulses are 2+ on the right side and 2+ on the left side.       Dorsalis pedis pulses are 2+ on the right side and 2+ on the left side.     Heart sounds: Normal heart sounds, S1 normal and S2 normal. No murmur heard.   Pulmonary:     Effort: Pulmonary effort is normal. No respiratory distress.     Breath sounds: Normal breath sounds. No wheezing.  Abdominal:     General: There is no distension.     Palpations: Abdomen is soft. There is no mass.     Tenderness: There is no abdominal tenderness.  Musculoskeletal:     Right lower leg: No edema.     Left lower leg: No edema.  Neurological:     Mental Status: She is alert. Mental status is at baseline.  Psychiatric:        Mood and Affect: Mood normal.        Behavior: Behavior normal.        Thought Content: Thought content normal.      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   Crohn's Flare/Sigmoid Colon perforation:S/Pcolectomy on 7/24.S/P exploratory [place] morning8/5.Ostomy bagstill has decentbrown liquidand gasoutputtoday.Reports no irritation or pain around Ostomy. Will continue to monitor. -Continue advancing diet as able -Continue dressing changes on wound  -Continueon prednisone 20 mg daily -Continue to monitor for flare of diarrhea or hematochezia    Acute Hypoxia:Currently onroom air.Reportsminimalmucus being coughed up. -Holdlasix   HypovolemicHyponatremia:TodayNa is130.Continuing to encourage eating and drinking as much as possible. Will give gentle hydration. -Give 0.9% NaCl at 100 cc/hr for 5 hours -Holdlasix   Right peritoneal hematoma:Hemoglobintoday is9.0.Drain still in place, being emptied less frequently. Completed 6 days of IV Zosyn 3.375 g q8 on 8/17. -Continue Ciprofloxacin 500 mg BID (Day 3) -Will continue to  monitor output in drains   Adjustment Disorder with Depressed Mood:Has had significant change in mood since starting medication.Mood much improved.Willcontinue tomonitor response. -ContinueEscitalopram 10 mg  -Continue tomonitor for Serotonin Syndrome due to concurrent Tramadol use   Severe protein calorie malnutrition:Albuminwas 1.5yesterday.Currently onsoft diet, will continue to advance diet as tolerated.Encouraged eating as much as tolerated. K is 3.7 goal is 4.0-4.5 will order repletion. -Kdur 40 mEq ordered -Continually encouragingeating and drinking as  much as possible   Left Heel:No skin breakdown present.Erythema hasresolved.States shegotup to the room chair the past couple days.Encourage to conitnueto get up to the chair. Encouraged working with PT/OT as much as possible.  -Encouraged getting out of bed and sitting in chair -Encouraged working with PT/OT as much as possible -Daily checks   DVT prophx: SCDsandlovenoxsubcutaneous Diet:Soft diet IVF:0.9% NaCl 100 cc/hr for 5 hours   Prior to Admission Living Arrangement: Home Anticipated Discharge Location: Home vs CIR Barriers to Discharge: Continued drainage Dispo: Anticipated discharge in approximately 3-5 day(s).   Briant Cedar, MD 11/20/2019, 6:14 AM Pager: [phone] After 5pm on weekdays and 1pm on weekends: On Call pager [phone]

## 2019-11-20 NOTE — Progress Notes (Signed)
Nutrition Follow-up  DOCUMENTATION CODES:   Non-severe (moderate) malnutrition in context of chronic illness  INTERVENTION:  ContinueEnsure Enlive poQID, each supplement provides 350 kcal and 20 grams of protein  Continue30 ml Prosourcepo once daily, each supplement provides 100 kcal and 15 grams of protein.  Encourage adequate PO intake.  NUTRITION DIAGNOSIS:   Moderate Malnutrition related to chronic illness (crohn's disease) as evidenced by moderate muscle depletion, moderate fat depletion; ongoing  GOAL:   Patient will meet greater than or equal to 90% of their needs; progressing  MONITOR:   PO intake, Supplement acceptance, Skin, Weight trends, Labs, I & O's  REASON FOR ASSESSMENT:   Malnutrition Screening Tool, Consult Assessment of nutrition requirement/status  ASSESSMENT:   [name] is a 54 year old female with past medical history of HTN, diverticulitis s/p sigmoid colectomy in 2013, ventral hernia (repaired), and prior episode of colitis, and a recent hospitalization for diarrhea, hypotension and electrolyte derangements secondary to colitis who returned nine days after leaving AMA found to have continued electrolyte derangements with new onset peripheral edema.  Pt with Inflammatory Colitis secondary to Crohn's Disease  7/24 s/p ex lap, LOA, segmental colectomy with end colostomy due to large perforation in the prior rectosigmoid anastomosis 7/25 TPN started due to anticipated prolonged ileus post op 7/31 pt found to have large mesenteric hematoma (11.3 cm) within R retroperitoneum with innumerable bil renal infarcts - no intervention at this time; pt NPO 8/4Exploratory laparotomy for dehiscence, colostomy revision, placement of wound VAC 8/7 Diet advanced to soft diet, TPN weaned and discontinued  Meal completion has been varied from 10-75%. Pt has been tolerating her po diet well. Pt currently has Ensure and Prosource plus ordered and has  been consuming them. Pt encouraged to eat her food at meals and to drink her supplements. Labs and medications reviewed.   Diet Order:   Diet Order            DIET SOFT Room service appropriate? Yes; Fluid consistency: Thin  Diet effective now                 EDUCATION NEEDS:   No education needs have been identified at this time  Skin:  Skin Assessment: Skin Integrity Issues: Skin Integrity Issues:: Stage I, Stage II, Wound VAC, Incisions Stage I: L buttocks Stage II: R buttocks Wound Vac: N/A Incisions: abdomen  Last BM:  8/19 colostomy 150 ml output  Height:   Ht Readings from Last 1 Encounters:  11/04/19 5' 1"  (1.549 m)    Weight:   Wt Readings from Last 1 Encounters:  11/20/19 50.6 kg    BMI:  Body mass index is 21.08 kg/m.  Estimated Nutritional Needs:   Kcal:  1700-1900  Protein:  90-105 grams  Fluid:  > 1.7 L  Corrin Parker, MS, RD, LDN RD pager number/after hours weekend pager number on Amion.

## 2019-11-20 NOTE — Progress Notes (Addendum)
Physical Therapy Treatment Patient Details Name: Natasha Barrera MRN: [id number] DOB: September 12, 1965 Today's Date: 11/20/2019    History of Present Illness Natasha Barrera is a 54 year old female with PMH significant for HTN and diverticulitis (s/p sigmoid colectomy 2015) who originally presented with complaints of LE edema with later reports of abdominal pain and decreased appetite.  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 s/p exp laparatomy for dehiscence, colostomy revision, and wound vac placement 8/5.    PT Comments    Pt received in bed, in good spirits. Her primary c/o is R knee pain. She reports pain is from a chronic injury. She required min assist bed mobility and mod assist lateral scoot transfer bed to recliner. Declining LE exercises due to knee pain. Did perform ankle pumps bilaterally and instructed to perform 2-3 more sets today. Hot pack provided for R knee. Pt in recliner with feet elevated at end of session.     Follow Up Recommendations  CIR     Equipment Recommendations  Rolling walker with 5" wheels;3in1 (PT);Other (comment) (Pt reports she has w/c available)    Recommendations for Other Services       Precautions / Restrictions Precautions Precautions: Fall;Other (comment) Precaution Comments: Jp drain x2 Right side/ ostomy L side / abdomen binder when up Required Braces or Orthoses: Other Brace Other Brace: abdominal binder when OOB    Mobility  Bed Mobility Overal bed mobility: Needs Assistance Bed Mobility: Supine to Sit     Supine to sit: Min assist;HOB elevated     General bed mobility comments: +rail, increased time  Transfers Overall transfer level: Needs assistance   Transfers: Lateral/Scoot Transfers           Lateral/Scoot Transfers: Mod assist General transfer comment: lateral scoot transfer toward right, bed to recliner, cues for sequencing, increased time  Ambulation/Gait                 Stairs             Wheelchair Mobility    Modified Rankin (Stroke Patients Only)       Balance Overall balance assessment: Needs assistance Sitting-balance support: No upper extremity supported;Feet supported Sitting balance-Leahy Scale: Fair                                      Cognition Arousal/Alertness: Awake/alert Behavior During Therapy: WFL for tasks assessed/performed Overall Cognitive Status: Within Functional Limits for tasks assessed                                        Exercises General Exercises - Lower Extremity Ankle Circles/Pumps: AROM;Both;10 reps    General Comments General comments (skin integrity, edema, etc.): VSS on RA      Pertinent Vitals/Pain Pain Assessment: Faces Faces Pain Scale: Hurts little more Pain Location: R knee Pain Descriptors / Indicators: Grimacing;Aching Pain Intervention(s): Repositioned;Limited activity within patient's tolerance;Heat applied    Home Living                      Prior Function  PT Goals (current goals can now be found in the care plan section) Acute Rehab PT Goals Patient Stated Goal: home Progress towards PT goals: Progressing toward goals    Frequency    Min 3X/week      PT Plan Discharge plan needs to be updated    Co-evaluation              AM-PAC PT "6 Clicks" Mobility   Outcome Measure  Help needed turning from your back to your side while in a flat bed without using bedrails?: None Help needed moving from lying on your back to sitting on the side of a flat bed without using bedrails?: A Little Help needed moving to and from a bed to a chair (including a wheelchair)?: A Lot Help needed standing up from a chair using your arms  (e.g., wheelchair or bedside chair)?: A Lot Help needed to walk in hospital room?: Total Help needed climbing 3-5 steps with a railing? : Total 6 Click Score: 13    End of Session Equipment Utilized During Treatment: Gait belt;Other (comment) (abdominal binder) Activity Tolerance: Patient tolerated treatment well Patient left: in chair;with call bell/phone within reach Nurse Communication: Mobility status PT Visit Diagnosis: Other abnormalities of gait and mobility (R26.89);Muscle weakness (generalized) (M62.81)     Time: 8756-4332 PT Time Calculation (min) (ACUTE ONLY): 16 min  Charges:  $Therapeutic Activity: 8-22 mins                     Lorrin Goodell, PT  Office # [phone] Pager [phone]    [name] 11/20/2019, 10:01 AM

## 2019-11-20 NOTE — Progress Notes (Signed)
Referring Physician(s): Dr. Donne Hazel  Supervising Physician: Jacqulynn Cadet  Patient Status:  [facility] - In-pt  Chief Complaint: Colonic perforation s/p RLQ drain placement 11/11/19 in IR  Subjective:  Patient laying in bed watching TV, tells me she feels well today and is looking forward to her husband visiting later today. She did notice that her one drain (IR drain) suddenly changed color overnight and smells worse than before. She hasn't noticed any blood in the drain or leakage from the insertion site. She denies any new pain, staff has been flushing drain without any issues.  Allergies: Patient has no known allergies.  Medications: Prior to Admission medications   Medication Sig Start Date End Date Taking? Authorizing Provider  Calcium Carb-Cholecalciferol (CALCIUM+D3 PO) Take 1 tablet by mouth daily.   Yes [provider]  Cholecalciferol (VITAMIN D-3) 125 MCG (5000 UT) TABS Take 5,000 Units by mouth daily.   Yes [provider]  loperamide (IMODIUM A-D) 2 MG tablet Take 1 tablet (2 mg total) by mouth 4 (four) times daily as needed for diarrhea or loose stools. 09/20/19  Yes Maudie Mercury, MD  Multiple Vitamin (MULTIVITAMIN WITH MINERALS) TABS tablet Take 1 tablet by mouth daily. Centrum Silver   Yes [provider]     Vital Signs: BP 103/66 (BP Location: Left Arm)   Pulse 86   Temp 98.1 F (36.7 C) (Oral)   Resp 13   Ht 5' 1"  (1.549 m)   Wt 111 lb 8.8 oz (50.6 kg)   LMP 12/15/2014   SpO2 100%   BMI 21.08 kg/m   Physical Exam Vitals reviewed.  Constitutional:      General: She is not in acute distress. HENT:     Head: Normocephalic.  Cardiovascular:     Rate and Rhythm: Normal rate.  Pulmonary:     Effort: Pulmonary effort is normal.  Abdominal:     Palpations: Abdomen is soft.     Comments: (+) LLQ colostomy (+) RLQ surgical drain labeled #2 (+) RLQ IR drain to suction with about 1/2 bulb of thick, light tan, foul smelling  output. Flushes/aspirates without issue. Insertion site unremarkable.  Skin:    General: Skin is warm and dry.  Neurological:     Mental Status: She is alert. Mental status is at baseline.     Imaging: No results found.  Labs:  CBC: Recent Labs    11/16/19 0447 11/17/19 0440 11/19/19 0500 11/20/19 0500  WBC 5.0 6.2 5.8 6.9  HGB 8.3* 8.7* 8.4* 9.0*  HCT 26.0* 27.2* 27.2* 29.1*  PLT 354 396 426* 484*    COAGS: Recent Labs    10/31/19 1721 11/06/19 0725  INR 1.2 1.1  APTT  --  32    BMP: Recent Labs    11/16/19 0638 11/17/19 0440 11/19/19 0500 11/20/19 0500  NA 134* 132* 135 130*  K 3.4* 3.1* 3.0* 3.7  CL 101 100 101 101  CO2 26 24 26 25   GLUCOSE 82 82 87 86  BUN 5* 8 8 5*  CALCIUM 7.8* 7.6* 8.1* 7.9*  CREATININE <0.30* <0.30* 0.32* <0.30*  GFRNONAA NOT CALCULATED NOT CALCULATED >60 NOT CALCULATED  GFRAA NOT CALCULATED NOT CALCULATED >60 NOT CALCULATED    LIVER FUNCTION TESTS: Recent Labs    11/04/19 0136 11/05/19 0445 11/10/19 0415 11/19/19 0500  BILITOT 0.7 0.7 0.6 0.3  AST 48* 21 11* 10*  ALT 238* 127* 23 11  ALKPHOS 159* 126 112 76  PROT 4.4* 4.4* 4.6*  4.8*  ALBUMIN 1.6* 1.9* 1.7* 1.5*    Assessment and Plan:  54 y/o F admitted for diffuse colitis with large perforation of prior rectosigmoid anastomosis s/p ex lap with Hartmann's procedure 07/27/65 complicated by fascial dehiscence, mesenteric hematoma, pelvic abscess and mucocutaneous separation of colostomy. She underwent ex lap with colostomy revision and RLQ surgical drain placement on 11/04/19, later found to have intra-abdominal fluid collection and RLQ IR drain was placed 11/11/19 by Dr. Kathlene Cote.  RLQ remains to suction, output now thick, light tan and foul smelling concerning for possible fistula. Previously appeared to be consistent with hematoma. CCS has ordered CT abd/pelvis w/contrast to further assess this change.   Continue current drain management - IR will continue to follow.  Please call with questions or concerns.  Electronically Signed: Joaquim Nam, PA-C 11/20/2019, 2:56 PM   I spent a total of 15 Minutes at the the patient's bedside AND on the patient's hospital floor or unit, greater than 50% of which was counseling/coordinating care for RLQ drain follow up.

## 2019-11-20 NOTE — Consult Note (Signed)
[facility] Nurse ostomy follow up Stoma type/location: LUQ colostomy with mucocutaneous separation at 3:00.   Stomal assessment/size: 1 1/4" stoma with separation noted from 3 to 5 o'clock with 0.3 cm defect. Will fill dead space with aquacel ag Peristomal assessment: partial thickness skin loss at 6 o'clock.  Aquacel ag to this area, covered with barrier ring Treatment options for stomal/peristomal skin: see above  aquacel ag and barrier ring Output soft brown stool Ostomy pouching: 1pc.convex with aquacel ag to separation and barrier ring to peristomal skin Education provided:  Discussed rationale for aquacel ag and barrier ring  Pattern left at bedside.  Cut off center to accommodate midline incision.  Red rubber retention sutures in place over midline wound.   Enrolled patient in [facility] Start Discharge program: Yes Will follow.  Domenic Moras MSN, RN, FNP-BC CWON Wound, Ostomy, Continence Nurse Pager [phone]

## 2019-11-20 NOTE — Progress Notes (Signed)
16 Days Post-Op  Subjective: Feels well today.  Ambulated once yesterday.  States her goal is to be able to ambulate to the bathroom well on her own prior to discharge.  Eating well.  Pain is controlled.  ROS: See above, otherwise other systems negative  Objective: Vital signs in last 24 hours: Temp:  [97.4 F (36.3 C)-98.7 F (37.1 C)] 98.2 F (36.8 C) (08/20 0730) Pulse Rate:  [66-84] 84 (08/20 0730) Resp:  [13-20] 13 (08/20 0730) BP: (90-108)/(53-65) 95/65 (08/20 0730) SpO2:  [96 %-100 %] 97 % (08/20 0730) Weight:  [50.6 kg] 50.6 kg (08/20 0500) Last BM Date: 11/19/19  Intake/Output from previous day: 08/19 0701 - 08/20 0700 In: 193.8 [I.V.:10; IV Piggyback:183.8] Out: 840 [Urine:650; Drains:40; Stool:150] Intake/Output this shift: No intake/output data recorded.  PE: Abd: soft, midline wound overall clean, minimal fibrinous tissue at bases around sutures.  Retentions in place.  Ostomy is working well and stoma viable.  Both drains are frankly feculent today with flecks present in them.  Lab Results:  Recent Labs    11/19/19 0500 11/20/19 0500  WBC 5.8 6.9  HGB 8.4* 9.0*  HCT 27.2* 29.1*  PLT 426* 484*   BMET Recent Labs    11/19/19 0500 11/20/19 0500  NA 135 130*  K 3.0* 3.7  CL 101 101  CO2 26 25  GLUCOSE 87 86  BUN 8 5*  CREATININE 0.32* <0.30*  CALCIUM 8.1* 7.9*   PT/INR No results for input(s): LABPROT, INR in the last 72 hours. CMP     Component Value Date/Time   NA 130 (L) 11/20/2019 0500   K 3.7 11/20/2019 0500   CL 101 11/20/2019 0500   CO2 25 11/20/2019 0500   GLUCOSE 86 11/20/2019 0500   BUN 5 (L) 11/20/2019 0500   CREATININE <0.30 (L) 11/20/2019 0500   CALCIUM 7.9 (L) 11/20/2019 0500   PROT 4.8 (L) 11/19/2019 0500   ALBUMIN 1.5 (L) 11/19/2019 0500   AST 10 (L) 11/19/2019 0500   ALT 11 11/19/2019 0500   ALKPHOS 76 11/19/2019 0500   BILITOT 0.3 11/19/2019 0500   GFRNONAA NOT CALCULATED 11/20/2019 0500   GFRAA NOT CALCULATED  11/20/2019 0500   Lipase     Component Value Date/Time   LIPASE 18 09/19/2019 1631       Studies/Results: No results found.  Anti-infectives: Anti-infectives (From admission, onward)   Start     Dose/Rate Route Frequency Ordered Stop   11/18/19 1000  ciprofloxacin (CIPRO) tablet 500 mg        500 mg Oral 2 times daily 11/18/19 0840     11/11/19 1100  piperacillin-tazobactam (ZOSYN) IVPB 3.375 g  Status:  Discontinued        3.375 g 12.5 mL/hr over 240 Minutes Intravenous Every 8 hours 11/11/19 0945 11/18/19 0840   11/06/19 0600  ceFAZolin (ANCEF) IVPB 2g/100 mL premix  Status:  Discontinued        2 g 200 mL/hr over 30 Minutes Intravenous Every 8 hours 11/05/19 0914 11/08/19 1133   11/04/19 0600  cefTRIAXone (ROCEPHIN) 2 g in sodium chloride 0.9 % 100 mL IVPB  Status:  Discontinued        2 g 200 mL/hr over 30 Minutes Intravenous Every 24 hours 11/04/19 0500 11/05/19 0914   10/31/19 1700  vancomycin (VANCOCIN) IVPB 1000 mg/200 mL premix  Status:  Discontinued        1,000 mg 200 mL/hr over 60 Minutes Intravenous Every 12 hours 10/31/19  5852 11/01/19 1032   10/31/19 1000  vancomycin (VANCOREADY) IVPB 750 mg/150 mL  Status:  Discontinued        750 mg 150 mL/hr over 60 Minutes Intravenous Every 12 hours 10/31/19 0057 10/31/19 0742   10/31/19 0900  meropenem (MERREM) 2 g in sodium chloride 0.9 % 100 mL IVPB  Status:  Discontinued        2 g 200 mL/hr over 30 Minutes Intravenous Every 8 hours 10/31/19 0742 11/01/19 1032   10/31/19 0200  vancomycin (VANCOREADY) IVPB 1500 mg/300 mL        1,500 mg 150 mL/hr over 120 Minutes Intravenous  Once 10/31/19 0057 10/31/19 0700   10/31/19 0200  piperacillin-tazobactam (ZOSYN) IVPB 3.375 g  Status:  Discontinued        3.375 g 12.5 mL/hr over 240 Minutes Intravenous Every 8 hours 10/31/19 0057 10/31/19 0726   10/24/19 0600  piperacillin-tazobactam (ZOSYN) IVPB 3.375 g  Status:  Discontinued        3.375 g 12.5 mL/hr over 240 Minutes  Intravenous Every 8 hours 10/23/19 2305 10/29/19 0932   10/24/19 0000  piperacillin-tazobactam (ZOSYN) IVPB 4.5 g  Status:  Discontinued        4.5 g 200 mL/hr over 30 Minutes Intravenous Every 6 hours 10/23/19 2257 10/23/19 2302   10/23/19 2315  piperacillin-tazobactam (ZOSYN) IVPB 3.375 g        3.375 g 100 mL/hr over 30 Minutes Intravenous STAT 10/23/19 2302 10/23/19 2347       Assessment/Plan 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 Henrene Pastor, per GI, started Solumedrol 7/27;GI has decreased steroid,on PO prednisone,will not plan to start biologics until infection is under control and surgical wound is completely healed  Sigmoid colon perforation-  - 10/24/19:partial colectomy and end colostomy by Dr. Kae Heller.  - 10/30/19: large mesenteric hematoma noted, hep gtt held - 11/04/19:fascial dehiscence with visible transverse colon: S/P exploratory laparotomy, colostomy revision, fascial closure, placement 4 retention sutures, placement of wound VAC, Dr. Georgette Dover - 8/11: right abdominal abscess, s/p IR drain, culture KLEBSIELLA and PSEUDOMONAS AERUGINOSA, switched from zosyn to cipro 8/18 - BID wet to dry dressing changes -continuesurgicalJP drain- likely has breakdown of rectal stump, but JP drains controlling output and patient is not ill from this.  Nothing further to do.  Continue to monitor -tolerating soft diet and ostomy functioning Protein calorie malnutrition - prealbumin(8/2)14.2, off TPN Acute on chronic anemia- s/p 3u pRBC 7/30-8/1,Hgb8.7 (8/17), stable FEN: Ensure,soft diet, hypokalemia- replace IV and PO ID: [id number]/29;Ancef 8/6 for bacteremia (1/2 blood Cx 7/31 showed E.Coli)>>8/8, zosyn 8/11>8/18, cipro 8/18>> VTE: SCD's, lovenox Foley:out 8/6  Plan:Continue drains. Continue mobilizing. Continue wound care. Her  goal is home with home health; I would like to see her a little stronger before this. Will continue to follow.    LOS: 32 days    Henreitta Cea , [facility] Surgery 11/20/2019, 8:47 AM Please see Amion for pager number during day hours 7:00am-4:30pm or 7:00am -11:30am on weekends

## 2019-11-20 NOTE — Progress Notes (Addendum)
Had prolonged discussion with Natasha Barrera at bedside regarding her decision to pursue discharge home. Discussed concern regarding supervision and risk of fall with worsening of her tenuous health especially considering many post-op complications she has had during her hospital course.. Natasha Barrera expresses understanding but also describes support at home with her husband and children and confidence in her ability to do well at home. She mentions that she has not yet been able to walk to the bathroom but expresses enthusiasm in participating in physical therapy during admission and sets a personal goal to be strong enough to be discharged by end of next week. All other questions and concerns addressed.

## 2019-11-21 ENCOUNTER — Inpatient Hospital Stay (HOSPITAL_COMMUNITY): Payer: 59

## 2019-11-21 LAB — CBC
HCT: 27.5 % — ABNORMAL LOW (ref 36.0–46.0)
Hemoglobin: 8.5 g/dL — ABNORMAL LOW (ref 12.0–15.0)
MCH: 31.6 pg (ref 26.0–34.0)
MCHC: 30.9 g/dL (ref 30.0–36.0)
MCV: 102.2 fL — ABNORMAL HIGH (ref 80.0–100.0)
Platelets: 457 10*3/uL — ABNORMAL HIGH (ref 150–400)
RBC: 2.69 MIL/uL — ABNORMAL LOW (ref 3.87–5.11)
RDW: 19 % — ABNORMAL HIGH (ref 11.5–15.5)
WBC: 7.6 10*3/uL (ref 4.0–10.5)
nRBC: 0 % (ref 0.0–0.2)

## 2019-11-21 LAB — BASIC METABOLIC PANEL
Anion gap: 8 (ref 5–15)
BUN: <5 mg/dL — ABNORMAL LOW (ref 6–20)
CO2: 25 mmol/L (ref 22–32)
Calcium: 8.3 mg/dL — ABNORMAL LOW (ref 8.9–10.3)
Chloride: 102 mmol/L (ref 98–111)
Creatinine, Ser: 0.37 mg/dL — ABNORMAL LOW (ref 0.44–1.00)
GFR calc Af Amer: >60 mL/min (ref >60)
GFR calc non Af Amer: >60 mL/min (ref >60)
Glucose, Bld: 80 mg/dL (ref 70–99)
Potassium: 3.5 mmol/L (ref 3.5–5.1)
Sodium: 135 mmol/L (ref 135–145)

## 2019-11-21 MED ORDER — SODIUM CHLORIDE 0.9% FLUSH
5.0000 mL | Freq: Three times a day (TID) | INTRAVENOUS | Status: DC
  Administered 2019-11-21 – 2019-11-23 (×6): 5 mL

## 2019-11-21 MED ORDER — SODIUM CHLORIDE 0.9 % IV SOLN: INTRAVENOUS | Status: AC

## 2019-11-21 MED ORDER — IOHEXOL 9 MG/ML PO SOLN
500.0000 mL | ORAL | Status: AC
  Administered 2019-11-21 (×2): 500 mL via ORAL

## 2019-11-21 MED ORDER — POTASSIUM CHLORIDE CRYS ER 20 MEQ PO TBCR
40.0000 meq | EXTENDED_RELEASE_TABLET | Freq: Every day | ORAL | Status: DC
  Administered 2019-11-22 – 2019-11-23 (×2): 40 meq via ORAL
  Filled 2019-11-21 (×2): qty 2

## 2019-11-21 MED ORDER — IOHEXOL 300 MG/ML  SOLN
100.0000 mL | Freq: Once | INTRAMUSCULAR | Status: AC | PRN
  Administered 2019-11-21: 100 mL via INTRAVENOUS

## 2019-11-21 NOTE — Progress Notes (Signed)
17 Days Post-Op   Subjective/Chief Complaint: Drinking CT contrast  Objective: Vital signs in last 24 hours: Temp:  [97.7 F (36.5 C)-98.6 F (37 C)] 98.3 F (36.8 C) (08/21 0830) Pulse Rate:  [65-86] 72 (08/21 0830) Resp:  [13-18] 18 (08/21 0830) BP: (94-111)/(53-67) 94/62 (08/21 0830) SpO2:  [96 %-100 %] 96 % (08/21 0830) Last BM Date: (P) 11/20/19  Intake/Output from previous day: 08/20 0701 - 08/21 0700 In: -  Out: 1751 [Urine:1751] Intake/Output this shift: Total I/O In: -  Out: 50 [Stool:50]  General appearance: alert and cooperative Resp: clear to auscultation bilaterally GI: soft, ostomy with output, midline wound a little cleaner, JPs are purulent  Lab Results:  Recent Labs    11/20/19 0500 11/21/19 0500  WBC 6.9 7.6  HGB 9.0* 8.5*  HCT 29.1* 27.5*  PLT 484* 457*   BMET Recent Labs    11/20/19 0500 11/21/19 0500  NA 130* 135  K 3.7 3.5  CL 101 102  CO2 25 25  GLUCOSE 86 80  BUN 5* <5*  CREATININE <0.30* 0.37*  CALCIUM 7.9* 8.3*   PT/INR No results for input(s): LABPROT, INR in the last 72 hours. ABG No results for input(s): PHART, HCO3 in the last 72 hours.  Invalid input(s): PCO2, PO2  Studies/Results: No results found.  Anti-infectives: Anti-infectives (From admission, onward)   Start     Dose/Rate Route Frequency Ordered Stop   11/18/19 1000  ciprofloxacin (CIPRO) tablet 500 mg        500 mg Oral 2 times daily 11/18/19 0840     11/11/19 1100  piperacillin-tazobactam (ZOSYN) IVPB 3.375 g  Status:  Discontinued        3.375 g 12.5 mL/hr over 240 Minutes Intravenous Every 8 hours 11/11/19 0945 11/18/19 0840   11/06/19 0600  ceFAZolin (ANCEF) IVPB 2g/100 mL premix  Status:  Discontinued        2 g 200 mL/hr over 30 Minutes Intravenous Every 8 hours 11/05/19 0914 11/08/19 1133   11/04/19 0600  cefTRIAXone (ROCEPHIN) 2 g in sodium chloride 0.9 % 100 mL IVPB  Status:  Discontinued        2 g 200 mL/hr over 30 Minutes Intravenous Every  24 hours 11/04/19 0500 11/05/19 0914   10/31/19 1700  vancomycin (VANCOCIN) IVPB 1000 mg/200 mL premix  Status:  Discontinued        1,000 mg 200 mL/hr over 60 Minutes Intravenous Every 12 hours 10/31/19 0742 11/01/19 1032   10/31/19 1000  vancomycin (VANCOREADY) IVPB 750 mg/150 mL  Status:  Discontinued        750 mg 150 mL/hr over 60 Minutes Intravenous Every 12 hours 10/31/19 0057 10/31/19 0742   10/31/19 0900  meropenem (MERREM) 2 g in sodium chloride 0.9 % 100 mL IVPB  Status:  Discontinued        2 g 200 mL/hr over 30 Minutes Intravenous Every 8 hours 10/31/19 0742 11/01/19 1032   10/31/19 0200  vancomycin (VANCOREADY) IVPB 1500 mg/300 mL        1,500 mg 150 mL/hr over 120 Minutes Intravenous  Once 10/31/19 0057 10/31/19 0700   10/31/19 0200  piperacillin-tazobactam (ZOSYN) IVPB 3.375 g  Status:  Discontinued        3.375 g 12.5 mL/hr over 240 Minutes Intravenous Every 8 hours 10/31/19 0057 10/31/19 0726   10/24/19 0600  piperacillin-tazobactam (ZOSYN) IVPB 3.375 g  Status:  Discontinued        3.375 g 12.5 mL/hr over 240  Minutes Intravenous Every 8 hours 10/23/19 2305 10/29/19 0932   10/24/19 0000  piperacillin-tazobactam (ZOSYN) IVPB 4.5 g  Status:  Discontinued        4.5 g 200 mL/hr over 30 Minutes Intravenous Every 6 hours 10/23/19 2257 10/23/19 2302   10/23/19 2315  piperacillin-tazobactam (ZOSYN) IVPB 3.375 g        3.375 g 100 mL/hr over 30 Minutes Intravenous STAT 10/23/19 2302 10/23/19 2347      Assessment/Plan: 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 Henrene Pastor, per GI, started Solumedrol 7/27;GI has decreased steroid,on PO prednisone,will not plan to start biologics until infection is under control and surgical wound is completely healed  Sigmoid colon perforation-  - 10/24/19:partial colectomy and end colostomy by Dr. Kae Heller.   - 10/30/19: large mesenteric hematoma noted, hep gtt held - 11/04/19:fascial dehiscence with visible transverse colon: S/P exploratory laparotomy, colostomy revision, fascial closure, placement 4 retention sutures, placement of wound VAC, Dr. Georgette Dover - 8/11: right abdominal abscess, s/p IR drain, culture KLEBSIELLA and PSEUDOMONAS AERUGINOSA, switched from zosyn to cipro 8/18 - BID wet to dry dressing changes -continuesurgicalJP drain- likely has breakdown of rectal stump, but JP drains controlling output and patient is not ill from this.  Nothing further to do.  Continue to monitor -tolerating soft diet and ostomy functioning Protein calorie malnutrition - prealbumin(8/2)14.2, off TPN Acute on chronic anemia- s/p 3u pRBC 7/30-8/1,Hgb8.7 (8/17), stable FEN: Ensure,soft diet, hypokalemia- replace IV and PO ID: [id number]/[id number]/29;Ancef 8/6 for bacteremia (1/2 blood Cx 7/31 showed E.Coli)>>8/8, zosyn 8/11>8/18, cipro 8/18>> VTE: SCD's, lovenox Foley:out 8/6  Plan:Continue drains. Continue mobilizing. Continue wound care. Her goal is home with home health.  F/U CT today to evaluate in light of purulent drain output. I also spoke with her husband.  LOS: 33 days    [name] 11/21/2019

## 2019-11-21 NOTE — Progress Notes (Signed)
   Subjective:  Natasha Barrera is a 54 y.o. F with PMH of HTN, Crohn's Dz, Diverticulosis admit for bowel perforation on hospital day 50  Natasha Barrera was examined and evaluated at bedside this am. She mentions feeling well this am. No acute events overnight. Mentions awaiting CT abd/pelvis to assess her drains. Denies any nausea, vomiting, fevers, chills  Objective:  Vital signs in last 24 hours: Vitals:   11/20/19 1601 11/20/19 1941 11/20/19 2338 11/21/19 0328  BP: 97/60 101/67 (!) 111/53 (!) 96/59  Pulse: 74 72 72 65  Resp: 14 17 18 18   Temp: 98.6 F (37 C) 98.4 F (36.9 C) 97.9 F (36.6 C) 97.7 F (36.5 C)  TempSrc: Oral Oral Oral Oral  SpO2: 99% 98% 99% 98%  Weight:      Height:       Gen: Well-developed, NAD Pulm: Breathing comfortably on room air, no cough, no distress  Abd: Stable drains x2 w/ purulent drainage noted Extm: ROM intact, No peripheral edema Neuro: AAOx3, Answers questions appropriately Psych: Normal mood and affect   Assessment/Plan:  Principal Problem:   Colon perforation (HCC) Active Problems:   Crohn's disease of large intestine with other complication (HCC)   Malnutrition of moderate degree (HCC)   Pressure injury of skin   Peritoneal hematoma   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 Natasha Barrera is significantly deconditioned since her surgery due to multiple post-op complications. Current she is pursuing home health PT once medical work-up / treatment is finished but PT/OT recommend CIR. Patient currently talking with family to decide - Appreciate assistance from [facility] coordinator  Right infected peritoneal hematoma s/p drain S/p drain placement on 11/11/19. Culture growing klebsiella pneumoniae, bacteroids, pseudomonas. Currently  on cipro. Abx day 10. Worsening purulence concerning for fistula. Surgery/IR aware and following. - F/u CT abd/pelvis - Trend cbc - C/w cipro - Appreciate IR/surgery recs  Hypovolemic Hypotonic Hyponatremia Severe protein calorie malnutrition Recurrent hyponatremia due to poor solute intake. Na 130 yesterday up to 135 this am.  - Trend bmps - Appreciate dietary assistance in proper nutrition  Crohn's Flare  Sigmoid Colon perforation s/p colostomy formation Present w/ sigmoid colon perf requiring colectomy on 10/24/19 due to Crohn's flair. GI on board but feels until surgical site and infections are resolved, not a candidate for biologics. Currently on prednisone 41m daily - C/w PT/OT while hospitalizaed - C/w predniseon 229mBID  Adjustment Disorder w/ Depressed Mood In good mood this am - C/w escitalopram 1033maily  DVT prophx: lovenox Diet: Soft Bowel: N/A Code: Full  Prior to Admission Living Arrangement:  Home Anticipated Discharge Location: SNF Barriers to Discharge: Medical tx Dispo: Anticipated discharge in approximately 3-4 days   Natasha Barrera 11/21/2019, 7:15 AM Pager: [phone] 5pm on weekdays and 1pm on weekends: On Call Pager: [phone]

## 2019-11-21 NOTE — Progress Notes (Addendum)
Referring Physician(s): Jeanmarie Hubert  Supervising Physician: Jacqulynn Cadet  Patient Status:  [facility] - In-pt  Chief Complaint:  Abdominal  abscess  Subjective: Pt currently without new c/o; headed down now for f/u CT   Allergies: Patient has no known allergies.  Medications: Prior to Admission medications   Medication Sig Start Date End Date Taking? Authorizing Provider  Calcium Carb-Cholecalciferol (CALCIUM+D3 PO) Take 1 tablet by mouth daily.   Yes [provider]  Cholecalciferol (VITAMIN D-3) 125 MCG (5000 UT) TABS Take 5,000 Units by mouth daily.   Yes [provider]  loperamide (IMODIUM A-D) 2 MG tablet Take 1 tablet (2 mg total) by mouth 4 (four) times daily as needed for diarrhea or loose stools. 09/20/19  Yes Maudie Mercury, MD  Multiple Vitamin (MULTIVITAMIN WITH MINERALS) TABS tablet Take 1 tablet by mouth daily. Centrum Silver   Yes [provider]     Vital Signs: BP 107/67 (BP Location: Left Arm)   Pulse 78   Temp 98.2 F (36.8 C) (Oral)   Resp 18   Ht 5' 1"  (1.549 m)   Wt 111 lb 8.8 oz (50.6 kg)   LMP 12/15/2014   SpO2 98%   BMI 21.08 kg/m   Physical Exam awake/alert; rt abd drain intact, dressing clean and dry, not sig tender, about 15-20 cc purulent light brown fluid in JP bulb,? enteric origin; drain flushed without difficulty  Imaging: No results found.  Labs:  CBC: Recent Labs    11/17/19 0440 11/19/19 0500 11/20/19 0500 11/21/19 0500  WBC 6.2 5.8 6.9 7.6  HGB 8.7* 8.4* 9.0* 8.5*  HCT 27.2* 27.2* 29.1* 27.5*  PLT 396 426* 484* 457*    COAGS: Recent Labs    10/31/19 1721 11/06/19 0725  INR 1.2 1.1  APTT  --  32    BMP: Recent Labs    11/17/19 0440 11/19/19 0500 11/20/19 0500 11/21/19 0500  NA 132* 135 130* 135  K 3.1* 3.0* 3.7 3.5  CL 100 101 101 102  CO2 24 26 25 25   GLUCOSE 82 87 86 80  BUN 8 8 5* <5*  CALCIUM 7.6* 8.1* 7.9* 8.3*  CREATININE <0.30* 0.32* <0.30* 0.37*  GFRNONAA  NOT CALCULATED >60 NOT CALCULATED >60  GFRAA NOT CALCULATED >60 NOT CALCULATED >60    LIVER FUNCTION TESTS: Recent Labs    11/04/19 0136 11/05/19 0445 11/10/19 0415 11/19/19 0500  BILITOT 0.7 0.7 0.6 0.3  AST 48* 21 11* 10*  ALT 238* 127* 23 11  ALKPHOS 159* 126 112 76  PROT 4.4* 4.4* 4.6* 4.8*  ALBUMIN 1.6* 1.9* 1.7* 1.5*    Assessment and Plan: 54 y/o WF admitted for diffuse colitis with large perforation of prior rectosigmoid anastomosis s/p ex lap with Hartmann's procedure 7/62/83 complicated by fascial dehiscence, mesenteric hematoma, pelvic abscess and mucocutaneous separation of colostomy. She underwent ex lap with colostomy revision and RLQ surgical drain placement on 11/04/19, later found to have intra-abdominal fluid collection and RLQ IR drain was placed 11/11/19 by Dr. Kathlene Cote; afebrile; WBC nl; hgb 8.5(9.0), creat 0.37, ; drain fluid cx- klebsiella, pseudomonas; check f/u CT A/P today; cont drain irrigation; will likely need drain injection as well before removal  Electronically Signed: D. Rowe Robert, PA-C 11/21/2019, 11:42 AM   I spent a total of 15 minutes at the the patient's bedside AND on the patient's hospital floor or unit, greater than 50% of which was counseling/coordinating care for abdominal abscess drain    Patient ID: Natasha Barrera  D Haen, female   DOB: [birth year], 54 y.o.   MRN: [id number]

## 2019-11-21 NOTE — Progress Notes (Signed)
Inpatient Rehab Admissions Coordinator:   I met with patient and husband at bedside for ongoing discussion regarding CIR admission. Pt. Is agreeable to come to CIR; however, I do not have a bed available for this patient today. Also, await results of abdominal CT to be performed this morning. Will continue to follow for potential admission tomorrow or early next week.   Clemens Catholic, [place], [place] Admissions Coordinator  [phone] ([place]) [phone] (office)

## 2019-11-22 LAB — BASIC METABOLIC PANEL
Anion gap: 9 (ref 5–15)
BUN: <5 mg/dL — ABNORMAL LOW (ref 6–20)
CO2: 24 mmol/L (ref 22–32)
Calcium: 7.9 mg/dL — ABNORMAL LOW (ref 8.9–10.3)
Chloride: 100 mmol/L (ref 98–111)
Creatinine, Ser: 0.34 mg/dL — ABNORMAL LOW (ref 0.44–1.00)
GFR calc Af Amer: >60 mL/min (ref >60)
GFR calc non Af Amer: >60 mL/min (ref >60)
Glucose, Bld: 83 mg/dL (ref 70–99)
Potassium: 3.5 mmol/L (ref 3.5–5.1)
Sodium: 133 mmol/L — ABNORMAL LOW (ref 135–145)

## 2019-11-22 LAB — CBC
HCT: 28.4 % — ABNORMAL LOW (ref 36.0–46.0)
Hemoglobin: 8.6 g/dL — ABNORMAL LOW (ref 12.0–15.0)
MCH: 31.5 pg (ref 26.0–34.0)
MCHC: 30.3 g/dL (ref 30.0–36.0)
MCV: 104 fL — ABNORMAL HIGH (ref 80.0–100.0)
Platelets: 410 10*3/uL — ABNORMAL HIGH (ref 150–400)
RBC: 2.73 MIL/uL — ABNORMAL LOW (ref 3.87–5.11)
RDW: 19.4 % — ABNORMAL HIGH (ref 11.5–15.5)
WBC: 6.9 10*3/uL (ref 4.0–10.5)
nRBC: 0 % (ref 0.0–0.2)

## 2019-11-22 MED ORDER — GLUCERNA SHAKE PO LIQD: 237.0000 mL | Freq: Three times a day (TID) | ORAL | Status: DC

## 2019-11-22 MED ORDER — SODIUM CHLORIDE 0.9 % IV SOLN: INTRAVENOUS | Status: AC

## 2019-11-22 NOTE — Progress Notes (Signed)
Inpatient Rehab Admissions Coordinator:   Met with patient and her husband at bedside to discuss potential CIR admission. I do not have a bed for patient today, but Pt. Remains interested in CIR placement once a bed becomes available. States that she would like her drains out if possible before coming to CIR.   Clemens Catholic, [place], [place] Admissions Coordinator  [phone] ([place]) [phone] (office)

## 2019-11-22 NOTE — Progress Notes (Signed)
   Subjective:  Natasha Barrera is a 54 y.o. F with PMH of HTN, Crohn's Dz, Diverticulosis admit for bowel perf on hospital day 56  Natasha Barrera seen at bedside. She is doing well, reports appetite slowly improving. Breathing well, no cough. Discussed with patient results of CT, will discuss with surgery plan for drainage lines, antibiotics.  Objective:  Vital signs in last 24 hours: Vitals:   11/21/19 1635 11/22/19 0010 11/22/19 0420 11/22/19 0701  BP: 103/63 97/64 (!) 96/51   Pulse: 90 96 94   Resp: 20 20 20    Temp: 99.2 F (37.3 C) 98.2 F (36.8 C) 98.9 F (37.2 C)   TempSrc: Oral Oral Oral   SpO2: 96% 98% 96% 98%  Weight:      Height:       Gen: Well-developed, NAD Pulm: Breathing comfortably on room air, no cough, no distress  Abd: Surgical site covered w/ fresh bandaging, stable drains w/ greenish yellow drainage Extm: ROM intact, No peripheral edema Skin: Dry, Warm Neuro: AAOx3, Answers questions appropriately Psych: Normal mood and affect   Assessment/Plan:  Principal Problem:   Colon perforation (HCC) Active Problems:   Crohn's disease of large intestine with other complication (HCC)   Malnutrition of moderate degree (HCC)   Pressure injury of skin   Peritoneal hematoma   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 Currently no beds available. Awaiting placement - Appreciate assistance from CIR coordinator  Right infected peritoneal hematoma s/p drain S/p drain placement on 11/11/19. Culture growing klebsiella pneumoniae, bacteroids, pseudomonas. Currently on cipro. Abx day 10. Worsening purulence concerning for fistula. CT abd/pelvis reassuring with resolution of abscess and trace fluids. Wbc stable at 6.9.  Afebrile - Appreciate surgery recs - Trend cbc - C/w cipro (Currently day 12 of treatment w/ abx after 7 days of Zosyn and 5 days of cipro. Will d/c after completing 14 days of treatment after discussion with surgery)  Hypovolemic Hypotonic Hyponatremia Severe protein calorie malnutrition Recurrent hyponatremia due to poor solute intake. Na 130->135->133  - NS 100cc/hr - Trend bmps - Appreciate dietary assistance in proper nutrition  Crohn's Flare  Sigmoid Colon perforation s/p colostomy formation Present w/ sigmoid colon perf requiring colectomy on 10/24/19 due to Crohn's flair. GI on board but feels until surgical site and infections are resolved, not a candidate for biologics. Currently on prednisone 63m daily - C/w PT/OT while hospitalizaed - C/w prednisone 260mBID  Adjustment Disorder w/ Depressed Mood In good mood this am - C/w escitalopram 1062maily  DVT prophx: lovenox Diet: Soft Bowel: N/A Code: Full  Prior to Admission Living Arrangement:  Home Anticipated Discharge Location: CIR Barriers to Discharge: placement Dispo: Anticipated discharge in approximately 3-4 days LeeMosetta Barrera 11/22/2019, 12:37 PM Pager: [phone] 5pm on weekdays and 1pm on weekends: On Call Pager: [phone]

## 2019-11-22 NOTE — Progress Notes (Signed)
18 Days Post-Op   Subjective/Chief Complaint: CT looked much improved yesterday. Denies complaints at this time.  Objective: Vital signs in last 24 hours: Temp:  [98.2 F (36.8 C)-99.2 F (37.3 C)] 98.9 F (37.2 C) (08/22 0420) Pulse Rate:  [78-96] 94 (08/22 0420) Resp:  [18-20] 20 (08/22 0420) BP: (96-107)/(51-67) 96/51 (08/22 0420) SpO2:  [96 %-98 %] 98 % (08/22 0701) Last BM Date: 11/21/19  Intake/Output from previous day: 08/21 0701 - 08/22 0700 In: 794.8 [P.O.:240; I.V.:544.8] Out: 1055 [Urine:300; Drains:55; Stool:700] Intake/Output this shift: No intake/output data recorded.  General appearance: alert and cooperative Resp: clear to auscultation bilaterally GI: soft, ostomy with output, midline wound ~clean, JPs remain purulent  Lab Results:  Recent Labs    11/21/19 0500 11/22/19 0500  WBC 7.6 6.9  HGB 8.5* 8.6*  HCT 27.5* 28.4*  PLT 457* 410*   BMET Recent Labs    11/21/19 0500 11/22/19 0500  NA 135 133*  K 3.5 3.5  CL 102 100  CO2 25 24  GLUCOSE 80 83  BUN <5* <5*  CREATININE 0.37* 0.34*  CALCIUM 8.3* 7.9*   PT/INR No results for input(s): LABPROT, INR in the last 72 hours. ABG No results for input(s): PHART, HCO3 in the last 72 hours.  Invalid input(s): PCO2, PO2  Studies/Results: CT ABDOMEN PELVIS W CONTRAST  Result Date: 11/21/2019 CLINICAL DATA:  Abdominal abscess, purulence drainage, previous laparotomy EXAM: CT ABDOMEN AND PELVIS WITH CONTRAST TECHNIQUE: Multidetector CT imaging of the abdomen and pelvis was performed using the standard protocol following bolus administration of intravenous contrast. CONTRAST:  185m OMNIPAQUE IOHEXOL 300 MG/ML  SOLN COMPARISON:  11/11/2019 FINDINGS: Lower chest: Left greater than right pleural effusion and basilar consolidation, not significantly changed since prior study. Hepatobiliary: No focal liver abnormality is seen. No gallstones, gallbladder wall thickening, or biliary dilatation. Pancreas:  Unremarkable. No pancreatic ductal dilatation or surrounding inflammatory changes. Spleen: Normal in size without focal abnormality. Adrenals/Urinary Tract: Nonobstructing right renal calculi are again identified largest measuring 8 mm. There is mild distension of the proximal right ureter, likely due to extrinsic compression due to residual inflammatory change in the right hemiabdomen. No hydronephrosis. The left kidney is unremarkable. The bladder is grossly normal.  The adrenals are unremarkable. Stomach/Bowel: Diverting colostomy left mid abdomen again noted. There is no bowel obstruction or ileus. Vascular/Lymphatic: Stable atherosclerosis throughout the abdominal aorta and its branches. No pathologic adenopathy. Reproductive: Uterus and bilateral adnexa are unremarkable. Other: Postsurgical changes are seen from prior midline laparotomy, with continued surgical packing and external dressing at the laparotomy site. There is no evidence of pneumoperitoneum. Trace free fluid is seen within the pericolic gutters and lower pelvis. The fluid collections seen in the right lower abdomen on prior CT has been completely evacuated with interval placement of an indwelling pigtail drainage catheter. There is a persistent surgical drain entering the right mid abdomen, coiled within the lower pelvis. Musculoskeletal: There are no acute or destructive bony lesions. Reconstructed images demonstrate no additional findings. IMPRESSION: 1. Complete resolution of the right mid abdominal abscess after pigtail drainage catheter placement. No recurrence or residual fluid collection. 2. Trace free fluid within the paracolic gutters and lower pelvis. 3. Postsurgical changes from laparotomy, distal colectomy, and left mid abdominal colostomy, stable. 4. Small left pleural effusion and trace right pleural effusion, grossly stable. 5. Nonobstructing right renal calculi. There is some mild distension of the proximal right ureter, likely  due to extrinsic compression from residual inflammatory changes overlying the  right psoas muscle. No hydronephrosis. 6.  Aortic Atherosclerosis (ICD10-I70.0). Electronically Signed   By: Randa Ngo M.D.   On: 11/21/2019 15:24    Anti-infectives: Anti-infectives (From admission, onward)   Start     Dose/Rate Route Frequency Ordered Stop   11/18/19 1000  ciprofloxacin (CIPRO) tablet 500 mg        500 mg Oral 2 times daily 11/18/19 0840     11/11/19 1100  piperacillin-tazobactam (ZOSYN) IVPB 3.375 g  Status:  Discontinued        3.375 g 12.5 mL/hr over 240 Minutes Intravenous Every 8 hours 11/11/19 0945 11/18/19 0840   11/06/19 0600  ceFAZolin (ANCEF) IVPB 2g/100 mL premix  Status:  Discontinued        2 g 200 mL/hr over 30 Minutes Intravenous Every 8 hours 11/05/19 0914 11/08/19 1133   11/04/19 0600  cefTRIAXone (ROCEPHIN) 2 g in sodium chloride 0.9 % 100 mL IVPB  Status:  Discontinued        2 g 200 mL/hr over 30 Minutes Intravenous Every 24 hours 11/04/19 0500 11/05/19 0914   10/31/19 1700  vancomycin (VANCOCIN) IVPB 1000 mg/200 mL premix  Status:  Discontinued        1,000 mg 200 mL/hr over 60 Minutes Intravenous Every 12 hours 10/31/19 0742 11/01/19 1032   10/31/19 1000  vancomycin (VANCOREADY) IVPB 750 mg/150 mL  Status:  Discontinued        750 mg 150 mL/hr over 60 Minutes Intravenous Every 12 hours 10/31/19 0057 10/31/19 0742   10/31/19 0900  meropenem (MERREM) 2 g in sodium chloride 0.9 % 100 mL IVPB  Status:  Discontinued        2 g 200 mL/hr over 30 Minutes Intravenous Every 8 hours 10/31/19 0742 11/01/19 1032   10/31/19 0200  vancomycin (VANCOREADY) IVPB 1500 mg/300 mL        1,500 mg 150 mL/hr over 120 Minutes Intravenous  Once 10/31/19 0057 10/31/19 0700   10/31/19 0200  piperacillin-tazobactam (ZOSYN) IVPB 3.375 g  Status:  Discontinued        3.375 g 12.5 mL/hr over 240 Minutes Intravenous Every 8 hours 10/31/19 0057 10/31/19 0726   10/24/19 0600   piperacillin-tazobactam (ZOSYN) IVPB 3.375 g  Status:  Discontinued        3.375 g 12.5 mL/hr over 240 Minutes Intravenous Every 8 hours 10/23/19 2305 10/29/19 0932   10/24/19 0000  piperacillin-tazobactam (ZOSYN) IVPB 4.5 g  Status:  Discontinued        4.5 g 200 mL/hr over 30 Minutes Intravenous Every 6 hours 10/23/19 2257 10/23/19 2302   10/23/19 2315  piperacillin-tazobactam (ZOSYN) IVPB 3.375 g        3.375 g 100 mL/hr over 30 Minutes Intravenous STAT 10/23/19 2302 10/23/19 2347      Assessment/Plan: 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 Henrene Pastor, per GI, started Solumedrol 7/27;GI has decreased steroid,on PO prednisone,will not plan to start biologics until infection is under control and surgical wound is completely healed  Sigmoid colon perforation-  - 10/24/19:partial colectomy and end colostomy by Dr. Kae Heller.  - 10/30/19: large mesenteric hematoma noted, hep gtt held - 11/04/19:fascial dehiscence with visible transverse colon: S/P exploratory laparotomy, colostomy revision, fascial closure, placement 4 retention sutures, placement of wound VAC, Dr. Georgette Dover - 8/11: right abdominal abscess, s/p IR drain, culture KLEBSIELLA and PSEUDOMONAS AERUGINOSA, switched from  zosyn to cipro 8/18 - BID wet to dry dressing changes -continuesurgicalJP drain- likely has breakdown of rectal stump, but JP drains controlling output and patient is not ill from this.  Nothing further to do.  Continue to monitor -tolerating soft diet and ostomy functioning Protein calorie malnutrition - prealbumin(8/2)14.2, off TPN Acute on chronic anemia- s/p 3u pRBC 7/30-8/1,Hgb8.7 (8/17), stable FEN: Ensure,soft diet, hypokalemia- replace IV and PO ID: [id number];Ancef 8/6 for bacteremia (1/2 blood Cx 7/31 showed E.Coli)>>8/8, zosyn 8/11>8/18, cipro  8/18>> VTE: SCD's, lovenox Foley:out 8/6  Plan:Continue drains. Continue mobilizing. Continue wound care. Her goal is home with home health. CT A/P 8/22 much improved  LOS: 34 days    Natasha Barrera 11/22/2019

## 2019-11-23 ENCOUNTER — Inpatient Hospital Stay (HOSPITAL_COMMUNITY)
Admission: RE | Admit: 2019-11-23 | Discharge: 2019-12-09 | DRG: 945 | Disposition: A | Discharge status: Home Health | Payer: 59 | Source: Intra-hospital | Attending: Physical Medicine & Rehabilitation | Admitting: Physical Medicine & Rehabilitation

## 2019-11-23 ENCOUNTER — Encounter (HOSPITAL_COMMUNITY): Payer: Self-pay | Admitting: Physical Medicine & Rehabilitation

## 2019-11-23 DIAGNOSIS — Z87891 Personal history of nicotine dependence: Secondary | ICD-10-CM | POA: Diagnosis not present

## 2019-11-23 DIAGNOSIS — E43 Unspecified severe protein-calorie malnutrition: Secondary | ICD-10-CM | POA: Diagnosis present

## 2019-11-23 DIAGNOSIS — Z933 Colostomy status: Secondary | ICD-10-CM

## 2019-11-23 DIAGNOSIS — D62 Acute posthemorrhagic anemia: Secondary | ICD-10-CM | POA: Diagnosis present

## 2019-11-23 DIAGNOSIS — I10 Essential (primary) hypertension: Secondary | ICD-10-CM | POA: Diagnosis present

## 2019-11-23 DIAGNOSIS — L0291 Cutaneous abscess, unspecified: Secondary | ICD-10-CM

## 2019-11-23 DIAGNOSIS — R5381 Other malaise: Secondary | ICD-10-CM | POA: Diagnosis present

## 2019-11-23 DIAGNOSIS — E876 Hypokalemia: Secondary | ICD-10-CM | POA: Diagnosis present

## 2019-11-23 DIAGNOSIS — K5010 Crohn's disease of large intestine without complications: Secondary | ICD-10-CM | POA: Diagnosis present

## 2019-11-23 DIAGNOSIS — I9589 Other hypotension: Secondary | ICD-10-CM | POA: Diagnosis not present

## 2019-11-23 DIAGNOSIS — I959 Hypotension, unspecified: Secondary | ICD-10-CM | POA: Diagnosis present

## 2019-11-23 DIAGNOSIS — F419 Anxiety disorder, unspecified: Secondary | ICD-10-CM | POA: Diagnosis not present

## 2019-11-23 DIAGNOSIS — K631 Perforation of intestine (nontraumatic): Secondary | ICD-10-CM

## 2019-11-23 DIAGNOSIS — K651 Peritoneal abscess: Secondary | ICD-10-CM | POA: Diagnosis present

## 2019-11-23 DIAGNOSIS — R64 Cachexia: Secondary | ICD-10-CM | POA: Diagnosis present

## 2019-11-23 DIAGNOSIS — Z681 Body mass index (BMI) 19 or less, adult: Secondary | ICD-10-CM

## 2019-11-23 DIAGNOSIS — E8809 Other disorders of plasma-protein metabolism, not elsewhere classified: Secondary | ICD-10-CM | POA: Diagnosis not present

## 2019-11-23 LAB — CBC
HCT: 27.5 % — ABNORMAL LOW (ref 36.0–46.0)
Hemoglobin: 8.6 g/dL — ABNORMAL LOW (ref 12.0–15.0)
MCH: 32.1 pg (ref 26.0–34.0)
MCHC: 31.3 g/dL (ref 30.0–36.0)
MCV: 102.6 fL — ABNORMAL HIGH (ref 80.0–100.0)
Platelets: 414 10*3/uL — ABNORMAL HIGH (ref 150–400)
RBC: 2.68 MIL/uL — ABNORMAL LOW (ref 3.87–5.11)
RDW: 19.5 % — ABNORMAL HIGH (ref 11.5–15.5)
WBC: 8 10*3/uL (ref 4.0–10.5)
nRBC: 0 % (ref 0.0–0.2)

## 2019-11-23 LAB — BASIC METABOLIC PANEL
Anion gap: 7 (ref 5–15)
BUN: 5 mg/dL — ABNORMAL LOW (ref 6–20)
CO2: 25 mmol/L (ref 22–32)
Calcium: 8.3 mg/dL — ABNORMAL LOW (ref 8.9–10.3)
Chloride: 102 mmol/L (ref 98–111)
Creatinine, Ser: 0.31 mg/dL — ABNORMAL LOW (ref 0.44–1.00)
GFR calc Af Amer: >60 mL/min (ref >60)
GFR calc non Af Amer: >60 mL/min (ref >60)
Glucose, Bld: 89 mg/dL (ref 70–99)
Potassium: 4.3 mmol/L (ref 3.5–5.1)
Sodium: 134 mmol/L — ABNORMAL LOW (ref 135–145)

## 2019-11-23 LAB — PHOSPHORUS: Phosphorus: 3.8 mg/dL (ref 2.5–4.6)

## 2019-11-23 LAB — MAGNESIUM: Magnesium: 1.8 mg/dL (ref 1.7–2.4)

## 2019-11-23 MED ORDER — POTASSIUM CHLORIDE CRYS ER 20 MEQ PO TBCR: 40.0000 meq | EXTENDED_RELEASE_TABLET | Freq: Every day | ORAL | Status: DC

## 2019-11-23 MED ORDER — ADULT MULTIVITAMIN W/MINERALS CH
1.0000 | ORAL_TABLET | Freq: Every day | ORAL | Status: DC
  Administered 2019-11-23 – 2019-12-09 (×17): 1 via ORAL
  Filled 2019-11-23 (×17): qty 1

## 2019-11-23 MED ORDER — GUAIFENESIN ER 600 MG PO TB12: 600.0000 mg | ORAL_TABLET | Freq: Two times a day (BID) | ORAL | Status: DC | PRN

## 2019-11-23 MED ORDER — TRAMADOL HCL 50 MG PO TABS: 50.0000 mg | ORAL_TABLET | Freq: Four times a day (QID) | ORAL | Status: DC | PRN

## 2019-11-23 MED ORDER — VITAMIN A 3 MG (10000 UNIT) PO CAPS
10000.0000 [IU] | ORAL_CAPSULE | Freq: Every day | ORAL | Status: DC
  Administered 2019-11-24 – 2019-12-09 (×16): 10000 [IU] via ORAL
  Filled 2019-11-23 (×16): qty 1

## 2019-11-23 MED ORDER — LIDOCAINE 5 % EX PTCH
1.0000 | MEDICATED_PATCH | CUTANEOUS | Status: DC
  Filled 2019-11-23 (×8): qty 1

## 2019-11-23 MED ORDER — ALTEPLASE 2 MG IJ SOLR
2.0000 mg | INTRAMUSCULAR | Status: AC
  Administered 2019-11-23: 2 mg

## 2019-11-23 MED ORDER — GABAPENTIN 100 MG PO CAPS: 200.0000 mg | ORAL_CAPSULE | Freq: Two times a day (BID) | ORAL | 0 refills | Status: DC

## 2019-11-23 MED ORDER — PREDNISONE 20 MG PO TABS
20.0000 mg | ORAL_TABLET | Freq: Every day | ORAL | Status: DC
  Administered 2019-11-24 – 2019-12-09 (×16): 20 mg via ORAL
  Filled 2019-11-23 (×16): qty 1

## 2019-11-23 MED ORDER — METHOCARBAMOL 500 MG PO TABS
1000.0000 mg | ORAL_TABLET | Freq: Three times a day (TID) | ORAL | Status: DC
  Administered 2019-11-23 – 2019-12-09 (×47): 1000 mg via ORAL
  Filled 2019-11-23 (×47): qty 2

## 2019-11-23 MED ORDER — ALTEPLASE 2 MG IJ SOLR
2.0000 mg | Freq: Once | INTRAMUSCULAR | Status: AC
  Administered 2019-11-23: 2 mg

## 2019-11-23 MED ORDER — POTASSIUM CHLORIDE CRYS ER 20 MEQ PO TBCR
20.0000 meq | EXTENDED_RELEASE_TABLET | Freq: Every day | ORAL | Status: DC
  Administered 2019-11-24 – 2019-11-28 (×5): 20 meq via ORAL
  Filled 2019-11-23 (×5): qty 1

## 2019-11-23 MED ORDER — MELATONIN 3 MG PO TABS: 3.0000 mg | ORAL_TABLET | Freq: Every evening | ORAL | Status: DC | PRN

## 2019-11-23 MED ORDER — GABAPENTIN 100 MG PO CAPS
200.0000 mg | ORAL_CAPSULE | Freq: Two times a day (BID) | ORAL | Status: DC
  Administered 2019-11-23 – 2019-12-09 (×32): 200 mg via ORAL
  Filled 2019-11-23 (×32): qty 2

## 2019-11-23 MED ORDER — PANTOPRAZOLE SODIUM 40 MG PO TBEC
40.0000 mg | DELAYED_RELEASE_TABLET | Freq: Every day | ORAL | 0 refills | Status: DC
Start: 2019-11-24 — End: 2019-12-08

## 2019-11-23 MED ORDER — PANTOPRAZOLE SODIUM 40 MG PO TBEC
40.0000 mg | DELAYED_RELEASE_TABLET | Freq: Every day | ORAL | Status: DC
  Administered 2019-11-24 – 2019-12-09 (×16): 40 mg via ORAL
  Filled 2019-11-23 (×16): qty 1

## 2019-11-23 MED ORDER — ENSURE ENLIVE PO LIQD
237.0000 mL | Freq: Four times a day (QID) | ORAL | Status: DC
  Administered 2019-11-23 – 2019-12-08 (×49): 237 mL via ORAL
  Filled 2019-11-23 (×5): qty 237

## 2019-11-23 MED ORDER — ENOXAPARIN SODIUM 40 MG/0.4ML ~~LOC~~ SOLN: 40.0000 mg | SUBCUTANEOUS | Status: DC

## 2019-11-23 MED ORDER — ESCITALOPRAM OXALATE 10 MG PO TABS
10.0000 mg | ORAL_TABLET | Freq: Every day | ORAL | Status: DC
  Administered 2019-11-24 – 2019-12-09 (×16): 10 mg via ORAL
  Filled 2019-11-23 (×16): qty 1

## 2019-11-23 MED ORDER — ENOXAPARIN SODIUM 40 MG/0.4ML ~~LOC~~ SOLN
40.0000 mg | SUBCUTANEOUS | Status: DC
  Administered 2019-11-24 – 2019-12-08 (×15): 40 mg via SUBCUTANEOUS
  Filled 2019-11-23 (×15): qty 0.4

## 2019-11-23 MED ORDER — ALTEPLASE 2 MG IJ SOLR
2.0000 mg | INTRAMUSCULAR | Status: DC
  Administered 2019-11-23: 2 mg

## 2019-11-23 MED ORDER — PROSOURCE PLUS PO LIQD
30.0000 mL | Freq: Every day | ORAL | Status: DC
  Administered 2019-11-24 – 2019-12-09 (×16): 30 mL via ORAL
  Filled 2019-11-23 (×16): qty 30

## 2019-11-23 MED ORDER — CIPROFLOXACIN HCL 500 MG PO TABS: 500.0000 mg | ORAL_TABLET | Freq: Two times a day (BID) | ORAL | 0 refills | Status: DC

## 2019-11-23 MED ORDER — METHOCARBAMOL 500 MG PO TABS: 1000.0000 mg | ORAL_TABLET | Freq: Three times a day (TID) | ORAL | 0 refills | Status: DC

## 2019-11-23 MED ORDER — ACETAMINOPHEN 325 MG PO TABS
650.0000 mg | ORAL_TABLET | Freq: Four times a day (QID) | ORAL | Status: DC
  Administered 2019-11-24: 650 mg via ORAL
  Filled 2019-11-23 (×5): qty 2

## 2019-11-23 MED ORDER — ESCITALOPRAM OXALATE 10 MG PO TABS
10.0000 mg | ORAL_TABLET | Freq: Every day | ORAL | 0 refills | Status: DC
Start: 2019-11-24 — End: 2019-12-08

## 2019-11-23 MED ORDER — CIPROFLOXACIN HCL 250 MG PO TABS
500.0000 mg | ORAL_TABLET | Freq: Two times a day (BID) | ORAL | Status: DC
  Administered 2019-11-23 – 2019-11-25 (×4): 500 mg via ORAL
  Filled 2019-11-23 (×4): qty 2

## 2019-11-23 MED ORDER — PREDNISONE 20 MG PO TABS
20.0000 mg | ORAL_TABLET | Freq: Every day | ORAL | 0 refills | Status: DC
Start: 2019-11-24 — End: 2019-12-08

## 2019-11-23 NOTE — Progress Notes (Signed)
PICC de-clotting. Removed tPA from triple lumen PICC. All lumen with brisk blood return. Noted pt is no longer on scheduled IV meds. Please consider PICC removal or exchange to single lumen if vascular access is still needed.

## 2019-11-23 NOTE — Progress Notes (Signed)
Physical Therapy Treatment Patient Details Name: Natasha Barrera MRN: [id number] DOB: 1965/10/24 Today's Date: 11/23/2019    History of Present Illness Natasha Barrera is a 54 year old female with PMH significant for HTN and diverticulitis (s/p sigmoid colectomy 2015) who originally presented with complaints of LE edema with later reports of abdominal pain and decreased appetite.  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 s/p exp laparatomy for dehiscence, colostomy revision, and wound vac placement 8/5.    PT Comments    Patient progressing this session able to ambulate few feet in the room with +2 for safety due to LE weakness and possibility of buckling.  She worked up to it initially with standing marching, then steps forward and back.  Feel she remains appropriate for CIR level rehab at d/c.  PT to continue to follow.    Follow Up Recommendations  CIR     Equipment Recommendations  Rolling walker with 5" wheels;3in1 (PT);Other (comment)    Recommendations for Other Services       Precautions / Restrictions Precautions Precautions: Fall;Other (comment) Precaution Comments: Jp drain x2 Right side/ ostomy L side / abdomen binder when up Required Braces or Orthoses: Other Brace Other Brace: abdominal binder when OOB Restrictions Weight Bearing Restrictions: No    Mobility  Bed Mobility Overal bed mobility: Needs Assistance Bed Mobility: Supine to Sit;Rolling Rolling: Supervision   Supine to sit: Min assist;HOB elevated     General bed mobility comments: cues for technique, assist for lines/safety  Transfers Overall transfer level: Needs assistance Equipment used: Rolling walker (2 wheeled) Transfers: Sit to/from Stand Sit to Stand: Mod assist;Max  assist;+2 physical assistance;+2 safety/equipment         General transfer comment: pt max A of to complete sit<>stand from lower surfaces, but mod of 2 to complete from elevated surfaces, increased assistance provided at rear in order to promote full standing and initiating tucking of hips  Ambulation/Gait Ambulation/Gait assistance: Mod assist;+2 safety/equipment Gait Distance (Feet): 5 Feet (&7') Assistive device: Rolling walker (2 wheeled) Gait Pattern/deviations: Step-to pattern;Step-through pattern;Decreased stride length;Decreased dorsiflexion - right;Decreased dorsiflexion - left     General Gait Details: initially marching in place, then second stand taking step forward then back, then walked forward with chair follow, then again after moving chair back   Stairs             Wheelchair Mobility    Modified Rankin (Stroke Patients Only)       Balance Overall balance assessment: Needs assistance Sitting-balance support: Feet supported Sitting balance-Leahy Scale: Fair     Standing balance support: Bilateral upper extremity supported Standing balance-Leahy Scale: Poor                              Cognition Arousal/Alertness: Awake/alert Behavior During Therapy: WFL for tasks assessed/performed Overall Cognitive Status: Impaired/Different from baseline Area of Impairment: Memory;Problem solving                     Memory: Decreased short-term memory Following Commands: Follows one step commands consistently     Problem Solving: Requires verbal cues General Comments: Pt requires verbal cues for all care currently and minimal increased time, but extremely participatory and motivated  Exercises      General Comments        Pertinent Vitals/Pain Pain Assessment: Faces Faces Pain Scale: Hurts a little bit Pain Location: Generalized Pain Descriptors / Indicators: Grimacing Pain Intervention(s): Monitored during  session;Repositioned    Home Living                      Prior Function            PT Goals (current goals can now be found in the care plan section) Acute Rehab PT Goals Patient Stated Goal: home Progress towards PT goals: Progressing toward goals    Frequency    Min 3X/week      PT Plan Current plan remains appropriate    Co-evaluation PT/OT/SLP Co-Evaluation/Treatment: Yes Reason for Co-Treatment: Complexity of the patient's impairments (multi-system involvement);To address functional/ADL transfers PT goals addressed during session: Mobility/safety with mobility;Proper use of DME;Strengthening/ROM OT goals addressed during session: ADL's and self-care;Strengthening/ROM      AM-PAC PT "6 Clicks" Mobility   Outcome Measure  Help needed turning from your back to your side while in a flat bed without using bedrails?: A Little Help needed moving from lying on your back to sitting on the side of a flat bed without using bedrails?: A Little Help needed moving to and from a bed to a chair (including a wheelchair)?: A Lot Help needed standing up from a chair using your arms (e.g., wheelchair or bedside chair)?: A Lot Help needed to walk in hospital room?: Total Help needed climbing 3-5 steps with a railing? : Total 6 Click Score: 12    End of Session Equipment Utilized During Treatment: Gait belt Activity Tolerance: Patient tolerated treatment well Patient left: in chair;with call bell/phone within reach;with chair alarm set Nurse Communication: Mobility status PT Visit Diagnosis: Other abnormalities of gait and mobility (R26.89);Muscle weakness (generalized) (M62.81)     Time: 1015-1040 PT Time Calculation (min) (ACUTE ONLY): 25 min  Charges:  $Therapeutic Activity: 8-22 mins                     Magda Kiel, PT Acute Rehabilitation Services QKMMN:[phone] [phone] 11/23/2019    Natasha Barrera 11/23/2019, 4:37 PM

## 2019-11-23 NOTE — Progress Notes (Addendum)
19 Days Post-Op  Subjective: CC: Doing well. No abdominal pain, n/v. Colostomy functioning. Awaiting bed for CIR.   Objective: Vital signs in last 24 hours: Temp:  [97.8 F (36.6 C)-99 F (37.2 C)] 98.4 F (36.9 C) (08/23 0804) Pulse Rate:  [83-96] 89 (08/23 0804) Resp:  [18-20] 20 (08/23 0804) BP: (90-105)/(54-66) 93/54 (08/23 0804) SpO2:  [97 %-99 %] 97 % (08/23 0804) Last BM Date: 11/21/19  Intake/Output from previous day: 08/22 0701 - 08/23 0700 In: -  Out: 2170 [Urine:1600; Drains:20; Stool:550] Intake/Output this shift: No intake/output data recorded.  PE: Gen: Awake and alert, NAD Lungs: Normal rate and effort  Abd: Soft, ND, mild tenderness generalized without peritonitis. +BS. Colostomy with brown stool in bag. Midline wound as noted below. Retention sutures in place. IR drain with tan, scant purulent like fluid. Surgical drain with tan, scant, purulent like fluid Msk: No LE edema      Lab Results:  Recent Labs    11/22/19 0500 11/23/19 0712  WBC 6.9 8.0  HGB 8.6* 8.6*  HCT 28.4* 27.5*  PLT 410* 414*   BMET Recent Labs    11/21/19 0500 11/22/19 0500  NA 135 133*  K 3.5 3.5  CL 102 100  CO2 25 24  GLUCOSE 80 83  BUN <5* <5*  CREATININE 0.37* 0.34*  CALCIUM 8.3* 7.9*   PT/INR No results for input(s): LABPROT, INR in the last 72 hours. CMP     Component Value Date/Time   NA 133 (L) 11/22/2019 0500   K 3.5 11/22/2019 0500   CL 100 11/22/2019 0500   CO2 24 11/22/2019 0500   GLUCOSE 83 11/22/2019 0500   BUN <5 (L) 11/22/2019 0500   CREATININE 0.34 (L) 11/22/2019 0500   CALCIUM 7.9 (L) 11/22/2019 0500   PROT 4.8 (L) 11/19/2019 0500   ALBUMIN 1.5 (L) 11/19/2019 0500   AST 10 (L) 11/19/2019 0500   ALT 11 11/19/2019 0500   ALKPHOS 76 11/19/2019 0500   BILITOT 0.3 11/19/2019 0500   GFRNONAA >60 11/22/2019 0500   GFRAA >60 11/22/2019 0500   Lipase     Component Value Date/Time   LIPASE 18 09/19/2019 1631        Studies/Results: CT ABDOMEN PELVIS W CONTRAST  Result Date: 11/21/2019 CLINICAL DATA:  Abdominal abscess, purulence drainage, previous laparotomy EXAM: CT ABDOMEN AND PELVIS WITH CONTRAST TECHNIQUE: Multidetector CT imaging of the abdomen and pelvis was performed using the standard protocol following bolus administration of intravenous contrast. CONTRAST:  119m OMNIPAQUE IOHEXOL 300 MG/ML  SOLN COMPARISON:  11/11/2019 FINDINGS: Lower chest: Left greater than right pleural effusion and basilar consolidation, not significantly changed since prior study. Hepatobiliary: No focal liver abnormality is seen. No gallstones, gallbladder wall thickening, or biliary dilatation. Pancreas: Unremarkable. No pancreatic ductal dilatation or surrounding inflammatory changes. Spleen: Normal in size without focal abnormality. Adrenals/Urinary Tract: Nonobstructing right renal calculi are again identified largest measuring 8 mm. There is mild distension of the proximal right ureter, likely due to extrinsic compression due to residual inflammatory change in the right hemiabdomen. No hydronephrosis. The left kidney is unremarkable. The bladder is grossly normal.  The adrenals are unremarkable. Stomach/Bowel: Diverting colostomy left mid abdomen again noted. There is no bowel obstruction or ileus. Vascular/Lymphatic: Stable atherosclerosis throughout the abdominal aorta and its branches. No pathologic adenopathy. Reproductive: Uterus and bilateral adnexa are unremarkable. Other: Postsurgical changes are seen from prior midline laparotomy, with continued surgical packing and external dressing at the laparotomy site. There  is no evidence of pneumoperitoneum. Trace free fluid is seen within the pericolic gutters and lower pelvis. The fluid collections seen in the right lower abdomen on prior CT has been completely evacuated with interval placement of an indwelling pigtail drainage catheter. There is a persistent surgical  drain entering the right mid abdomen, coiled within the lower pelvis. Musculoskeletal: There are no acute or destructive bony lesions. Reconstructed images demonstrate no additional findings. IMPRESSION: 1. Complete resolution of the right mid abdominal abscess after pigtail drainage catheter placement. No recurrence or residual fluid collection. 2. Trace free fluid within the paracolic gutters and lower pelvis. 3. Postsurgical changes from laparotomy, distal colectomy, and left mid abdominal colostomy, stable. 4. Small left pleural effusion and trace right pleural effusion, grossly stable. 5. Nonobstructing right renal calculi. There is some mild distension of the proximal right ureter, likely due to extrinsic compression from residual inflammatory changes overlying the right psoas muscle. No hydronephrosis. 6.  Aortic Atherosclerosis (ICD10-I70.0). Electronically Signed   By: Randa Ngo M.D.   On: 11/21/2019 15:24    Anti-infectives: Anti-infectives (From admission, onward)   Start     Dose/Rate Route Frequency Ordered Stop   11/18/19 1000  ciprofloxacin (CIPRO) tablet 500 mg        500 mg Oral 2 times daily 11/18/19 0840     11/11/19 1100  piperacillin-tazobactam (ZOSYN) IVPB 3.375 g  Status:  Discontinued        3.375 g 12.5 mL/hr over 240 Minutes Intravenous Every 8 hours 11/11/19 0945 11/18/19 0840   11/06/19 0600  ceFAZolin (ANCEF) IVPB 2g/100 mL premix  Status:  Discontinued        2 g 200 mL/hr over 30 Minutes Intravenous Every 8 hours 11/05/19 0914 11/08/19 1133   11/04/19 0600  cefTRIAXone (ROCEPHIN) 2 g in sodium chloride 0.9 % 100 mL IVPB  Status:  Discontinued        2 g 200 mL/hr over 30 Minutes Intravenous Every 24 hours 11/04/19 0500 11/05/19 0914   10/31/19 1700  vancomycin (VANCOCIN) IVPB 1000 mg/200 mL premix  Status:  Discontinued        1,000 mg 200 mL/hr over 60 Minutes Intravenous Every 12 hours 10/31/19 0742 11/01/19 1032   10/31/19 1000  vancomycin (VANCOREADY) IVPB  750 mg/150 mL  Status:  Discontinued        750 mg 150 mL/hr over 60 Minutes Intravenous Every 12 hours 10/31/19 0057 10/31/19 0742   10/31/19 0900  meropenem (MERREM) 2 g in sodium chloride 0.9 % 100 mL IVPB  Status:  Discontinued        2 g 200 mL/hr over 30 Minutes Intravenous Every 8 hours 10/31/19 0742 11/01/19 1032   10/31/19 0200  vancomycin (VANCOREADY) IVPB 1500 mg/300 mL        1,500 mg 150 mL/hr over 120 Minutes Intravenous  Once 10/31/19 0057 10/31/19 0700   10/31/19 0200  piperacillin-tazobactam (ZOSYN) IVPB 3.375 g  Status:  Discontinued        3.375 g 12.5 mL/hr over 240 Minutes Intravenous Every 8 hours 10/31/19 0057 10/31/19 0726   10/24/19 0600  piperacillin-tazobactam (ZOSYN) IVPB 3.375 g  Status:  Discontinued        3.375 g 12.5 mL/hr over 240 Minutes Intravenous Every 8 hours 10/23/19 2305 10/29/19 0932   10/24/19 0000  piperacillin-tazobactam (ZOSYN) IVPB 4.5 g  Status:  Discontinued        4.5 g 200 mL/hr over 30 Minutes Intravenous Every 6 hours 10/23/19  2257 10/23/19 2302   10/23/19 2315  piperacillin-tazobactam (ZOSYN) IVPB 3.375 g        3.375 g 100 mL/hr over 30 Minutes Intravenous STAT 10/23/19 2302 10/23/19 2347       Assessment/Plan 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 Henrene Pastor, per GI, started Solumedrol 7/27;GI has decreased steroid,on PO prednisone,will not plan to start biologics until infection is under control and surgical wound is completely healed  Sigmoid colon perforation-  - 10/24/19:partial colectomy and end colostomy by Dr. Kae Heller.  - 10/30/19: large mesenteric hematoma noted, hep gtt held - 11/04/19:fascial dehiscence with visible transverse colon: S/P exploratory laparotomy, colostomy revision, fascial closure, placement 4 retention sutures, placement of wound VAC, Dr. Georgette Dover - 8/11: right  abdominal abscess, s/p IR drain, culture KLEBSIELLA and PSEUDOMONAS AERUGINOSA, switched from zosyn to cipro 8/18. Will need 14days of abx  - 8/21: CT A/P w/ complete resolution of the right mid abdominal abscess with no recurrence or residual fluid collection.  - BID wet to dry dressing changes -continuesurgicalJP drain-likely has breakdown of rectal stump, but JP drains controlling output and patient is not ill from this. Nothing further to do. Continue to monitor -tolerating soft diet and ostomy functioning - Continue retention sutures. I will discuss with MD duration - Okay for d/c to CIR when bed available  Protein calorie malnutrition - prealbumin(8/2)14.2, off TPN Acute on chronic anemia- s/p 3u pRBC 7/30-8/1,Hgb 8.6 and stable FEN: Ensure,soft diet ID: [id number]/[id number]/29;Ancef 8/6 for bacteremia (1/2 blood Cx 7/31 showed E.Coli)>>8/8, zosyn 8/11>8/18, cipro 8/18>> VTE: SCD's, lovenox Foley:out 8/6 Follow-Up - Dr. Kae Heller  Plan:Continue surgical drain. IR drain per IR. Continue mobilizing. Continue wound care. Okay for d/c to CIR when bed becomes available.    LOS: 35 days    Jillyn Ledger , [facility] Surgery 11/23/2019, 9:11 AM Please see Amion for pager number during day hours 7:00am-4:30pm

## 2019-11-23 NOTE — Progress Notes (Signed)
Inpatient Rehab Admissions Coordinator:   I have a CIR bed available for this patient today. She is amenable to transfer.   Clemens Catholic, [place], [place] Admissions Coordinator  [phone] ([place]) [phone] (office)

## 2019-11-23 NOTE — Consult Note (Signed)
[place] Nurse ostomy follow up Patient receiving care in [facility]N05. Patient's primary RN states ostomy pouch changed last night. One convex pouch at bedside. No evidence of washout or impending leakage at this time. Patient was able to open/close a sample pouch, and describe how to clean the pouch tail.  Plan is to see on Tuesday 8/24 for patient to do a complete pouch change with WOC assistance. Patient has good strength in hands and arms, and a willingness to participate. Val Riles, RN, MSN, CWOCN, CNS-BC, pager [phone]

## 2019-11-23 NOTE — Progress Notes (Signed)
Report given to nurse on Rehab unit. Education given to pt. Pt has been taking a more active role in dressing changes and emptying contents of colostomy this shift. Will transfer pt to rehab via bed. Katherina Right RN

## 2019-11-23 NOTE — TOC Transition Note (Signed)
Transition of Care (TOC) - CM/SW Discharge Note Marvetta Gibbons RN,BSN Transitions of Care Unit 4NP (non trauma) - RN Case Manager See Treatment Team for direct Phone #   Patient Details  Name: Natasha Barrera MRN: [id number] Date of Birth: 1966/02/09  Transition of Care [facility]) CM/SW Contact:  Dawayne Patricia, RN Phone Number: 11/23/2019, 11:43 AM   Clinical Narrative:    Pt stable for transition to [place] rehab today, insurance has given auth again and per [facility] with rehab they have a bed available today- pt has elected to accept rehab bed offer and will transition there later today.    Final next level of care: IP Rehab Facility Barriers to Discharge: Barriers Resolved   Patient Goals and CMS Choice Patient states their goals for this hospitalization and ongoing recovery are:: rehab and then home CMS Medicare.gov Compare Post Acute Care list provided to:: Patient Choice offered to / list presented to : Patient  Discharge Placement               [facility] rehab        Discharge Plan and Services In-house Referral: Clinical Social Work Discharge Planning Services: CM Consult Post Acute Care Choice: IP Rehab          DME Arranged: [place] Rile rolling, 3-N-1         HH Arranged: RN, PT, OT          Social Determinants of Health (SDOH) Interventions     Readmission Risk Interventions Readmission Risk Prevention Plan 11/23/2019  PCP or Specialist Appt within 3-5 Days (No Data)  [place] or [place] Complete  Social Work Consult for [place] Planning/Counseling [place] Not Applicable  Medication Review Press photographer) Complete  Some recent data might be hidden

## 2019-11-23 NOTE — Progress Notes (Signed)
Referring Physician(s): Dr. Donne Hazel  Supervising Physician: Arne Cleveland  Patient Status:  [facility] - In-pt  Chief Complaint: Colon perforation  Subjective:  Patient sitting up in chair.  Asking to get back in bed.  States she is sore again after working with PT.  CIR admission pending. RLQ drain in place.   Allergies: Patient has no known allergies.  Medications: Prior to Admission medications   Medication Sig Start Date End Date Taking? Authorizing Provider  Calcium Carb-Cholecalciferol (CALCIUM+D3 PO) Take 1 tablet by mouth daily.   Yes [provider]  Cholecalciferol (VITAMIN D-3) 125 MCG (5000 UT) TABS Take 5,000 Units by mouth daily.   Yes [provider]  loperamide (IMODIUM A-D) 2 MG tablet Take 1 tablet (2 mg total) by mouth 4 (four) times daily as needed for diarrhea or loose stools. 09/20/19  Yes Maudie Mercury, MD  Multiple Vitamin (MULTIVITAMIN WITH MINERALS) TABS tablet Take 1 tablet by mouth daily. Centrum Silver   Yes [provider]     Vital Signs: BP (!) 93/54 (BP Location: Left Arm)   Pulse 89   Temp 98.4 F (36.9 C) (Oral)   Resp 20   Ht 5' 1"  (1.549 m)   Wt 111 lb 8.8 oz (50.6 kg)   LMP 12/15/2014   SpO2 97%   BMI 21.08 kg/m   Physical Exam Vitals and nursing note reviewed.   NAD, alert Abdomen: Midline wound with double red rubber catheters in place. Right mid surgical drain in place. Posterior IR drain in place with tan/beige output. Output variable 10-35 mL over the past several days. Insertion site c/d/i.  Flushes easily, slowly aspirates.   Imaging: CT ABDOMEN PELVIS W CONTRAST  Result Date: 11/21/2019 CLINICAL DATA:  Abdominal abscess, purulence drainage, previous laparotomy EXAM: CT ABDOMEN AND PELVIS WITH CONTRAST TECHNIQUE: Multidetector CT imaging of the abdomen and pelvis was performed using the standard protocol following bolus administration of intravenous contrast. CONTRAST:  169m OMNIPAQUE  IOHEXOL 300 MG/ML  SOLN COMPARISON:  11/11/2019 FINDINGS: Lower chest: Left greater than right pleural effusion and basilar consolidation, not significantly changed since prior study. Hepatobiliary: No focal liver abnormality is seen. No gallstones, gallbladder wall thickening, or biliary dilatation. Pancreas: Unremarkable. No pancreatic ductal dilatation or surrounding inflammatory changes. Spleen: Normal in size without focal abnormality. Adrenals/Urinary Tract: Nonobstructing right renal calculi are again identified largest measuring 8 mm. There is mild distension of the proximal right ureter, likely due to extrinsic compression due to residual inflammatory change in the right hemiabdomen. No hydronephrosis. The left kidney is unremarkable. The bladder is grossly normal.  The adrenals are unremarkable. Stomach/Bowel: Diverting colostomy left mid abdomen again noted. There is no bowel obstruction or ileus. Vascular/Lymphatic: Stable atherosclerosis throughout the abdominal aorta and its branches. No pathologic adenopathy. Reproductive: Uterus and bilateral adnexa are unremarkable. Other: Postsurgical changes are seen from prior midline laparotomy, with continued surgical packing and external dressing at the laparotomy site. There is no evidence of pneumoperitoneum. Trace free fluid is seen within the pericolic gutters and lower pelvis. The fluid collections seen in the right lower abdomen on prior CT has been completely evacuated with interval placement of an indwelling pigtail drainage catheter. There is a persistent surgical drain entering the right mid abdomen, coiled within the lower pelvis. Musculoskeletal: There are no acute or destructive bony lesions. Reconstructed images demonstrate no additional findings. IMPRESSION: 1. Complete resolution of the right mid abdominal abscess after pigtail drainage catheter placement. No recurrence or residual fluid collection.  2. Trace free fluid within the paracolic  gutters and lower pelvis. 3. Postsurgical changes from laparotomy, distal colectomy, and left mid abdominal colostomy, stable. 4. Small left pleural effusion and trace right pleural effusion, grossly stable. 5. Nonobstructing right renal calculi. There is some mild distension of the proximal right ureter, likely due to extrinsic compression from residual inflammatory changes overlying the right psoas muscle. No hydronephrosis. 6.  Aortic Atherosclerosis (ICD10-I70.0). Electronically Signed   By: Randa Ngo M.D.   On: 11/21/2019 15:24    Labs:  CBC: Recent Labs    11/20/19 0500 11/21/19 0500 11/22/19 0500 11/23/19 0712  WBC 6.9 7.6 6.9 8.0  HGB 9.0* 8.5* 8.6* 8.6*  HCT 29.1* 27.5* 28.4* 27.5*  PLT 484* 457* 410* 414*    COAGS: Recent Labs    10/31/19 1721 11/06/19 0725  INR 1.2 1.1  APTT  --  32    BMP: Recent Labs    11/20/19 0500 11/21/19 0500 11/22/19 0500 11/23/19 0712  NA 130* 135 133* 134*  K 3.7 3.5 3.5 4.3  CL 101 102 100 102  CO2 25 25 24 25   GLUCOSE 86 80 83 89  BUN 5* <5* <5* 5*  CALCIUM 7.9* 8.3* 7.9* 8.3*  CREATININE <0.30* 0.37* 0.34* 0.31*  GFRNONAA NOT CALCULATED >60 >60 >60  GFRAA NOT CALCULATED >60 >60 >60    LIVER FUNCTION TESTS: Recent Labs    11/04/19 0136 11/05/19 0445 11/10/19 0415 11/19/19 0500  BILITOT 0.7 0.7 0.6 0.3  AST 48* 21 11* 10*  ALT 238* 127* 23 11  ALKPHOS 159* 126 112 76  PROT 4.4* 4.4* 4.6* 4.8*  ALBUMIN 1.6* 1.9* 1.7* 1.5*    Assessment and Plan: History of diverticulitis s/p sigmoid colectomy in 2013 who presented with diffuse colitis with large perforation in the region of prior rectosigmoid anastomosis s/p exploratory laparotomy with Harthman's procedure in OR 10/24/2019 by Dr. Kae Heller; complicated by fascial dehiscence, mesenteric hematoma, pelvic abscess, and mucocutaneous separation of colostomy s/p exploratory laparotomy for colostomy revision with drain placement x1 and wound vac placement in OR 11/04/2019 by  Dr. Georgette Dover; further complicated by development of intra-abdominal fluid collection s/p RLQ drain placement in IR 11/11/2019 by Dr. Kathlene Cote Patient sitting up in chair, states she is sore and ready to go back to bed.  Abdomen assessed.  Appears stable. RLQ drain in place with tan/beige.  Foul-smell around drain/wounds, however ostomy not with complete seal.  Output 10-35 over past 2-3 days.  Planning to d/c to rehab.  Continue current drain care for now including TID flushes.   Most recent imaging reviewed by Dr. Vernard Gambles who notes near complete resolve of collection.  Trend output and consider drain injection prior to removal based on assessments.    Electronically Signed: Docia Barrier, PA 11/23/2019, 1:47 PM   I spent a total of 15 Minutes at the the patient's bedside AND on the patient's hospital floor or unit, greater than 50% of which was counseling/coordinating care for colon perforation.

## 2019-11-23 NOTE — H&P (Signed)
Physical Medicine and Rehabilitation Admission H&P    No chief complaint on file. : HPI: Natasha Barrera is a 54 year old right-handed female with history of hypertension, diverticulitis status post sigmoid colectomy 2013, ventral hernia repair, tobacco use.  Patient with recent admission 10/02/2019 to 10/04/2019 for complaints of abdominal discomfort diarrhea and weight loss found to be hypotensive with electrolyte derangements.  CT abdomen pelvis at that time revealed diffuse colitis involving the ascending, transverse and descending colon.  GI was consulted recommendations inpatient colostomy and stool studies.  Patient and her husband refused to stay for complete work-up and left AMA.  Since her discharge of 10/04/2019 continued experiencing worsening fatigue poor appetite loose stools as well as some swelling lower extremities.  Patient presented back 10/18/2019 with increasing abdominal pain.  Blood pressure 97/65 hemoglobin 10.4 platelet 593 sodium 123 chloride 86 potassium 3.5 total protein 4.4, total bilirubin 1.3.  CTA unremarkable.  GI services consulted Dr. Henrene Pastor underwent colonoscopy/upper GI endoscopy 10/21/2019 that showed the terminal ileum appeared normal.  There was scarring and mucosal bridging as well as scattered areas of ulceration throughout the entire colon.  There was evidence of prior sigmoid colectomy and a few diverticula.  EGD was unremarkable.  Findings favored Crohn's colitis.  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 Kae Heller.  Postoperative course  complicated by hypotension with follow-up for critical care maintained on IV hydration as well as Levophed.  She did require IV heparin for thrombosis and she did develop subsequent large mesenteric hematoma 10/30/2019 and IV heparin discontinued.  On 11/04/2019 she had facial dehiscence with visible transverse colon underwent exploratory laparotomy colostomy revision, fascial closure, placement of 4 retention sutures and wound VAC along with right JP drain placement.  As of 11/11/2019 findings of right abdominal abscess status post IR drain per Dr.Yamagata As noted cultures Klebsiella and Pseudomonas and initially maintained on Zosyn switched to Cipro 11/18/2019.  Latest CT of the abdomen 11/21/2019 showed complete resolution of right mid abdominal abscess after pigtail drainage catheter placement.  No recurrence or residual fluid collection.  Patient undergoing twice daily wet-to-dry dressing changes.  She was cleared to begin subcutaneous Lovenox for DVT prophylaxis.  Patient with slow progressive gains acute blood loss anemia latest hemoglobin 8.4, sodium 135.  Therapy evaluations completed and patient was admitted for a comprehensive rehab program  Review of Systems  Constitutional: Positive for fever, malaise/fatigue and weight loss.  HENT: Negative for hearing loss.   Eyes: Negative for blurred vision and double vision.  Respiratory: Negative for cough and shortness of breath.   Cardiovascular: Positive for leg swelling. Negative for chest pain and palpitations.  Gastrointestinal: Negative for abdominal pain, diarrhea, heartburn, nausea and vomiting.  Genitourinary: Negative for dysuria, flank pain and hematuria.  Musculoskeletal: Positive for myalgias.  Skin: Negative for rash.  Psychiatric/Behavioral: Negative for depression. The patient does not have insomnia.   All other systems reviewed and are negative.  Past Medical History:  Diagnosis Date  . Diverticul disease small and large intestine, no  perforati or abscess   . Diverticulitis of colon   . Hypertension    Past Surgical  History:  Procedure Laterality Date  . APPLICATION OF WOUND VAC N/A 11/04/2019   Procedure: APPLICATION OF WOUND VAC;  Surgeon: Donnie Mesa, MD;  Location: [facility];  Service: General;  Laterality: N/A;  . BIOPSY  10/21/2019   Procedure: BIOPSY;  Surgeon: Irene Shipper, MD;  Location: [facility] ENDOSCOPY;  Service: Endoscopy;;  . COLON SURGERY  12/25/2011  . COLONOSCOPY    . COLONOSCOPY WITH PROPOFOL N/A 10/21/2019   Procedure: COLONOSCOPY WITH PROPOFOL;  Surgeon: Irene Shipper, MD;  Location: [facility] ENDOSCOPY;  Service: Endoscopy;  Laterality: N/A;  . COLOSTOMY Left 10/24/2019   Procedure: COLOSTOMY;  Surgeon: Clovis Riley, MD;  Location: [facility];  Service: General;  Laterality: Left;  . ESOPHAGOGASTRODUODENOSCOPY (EGD) WITH PROPOFOL N/A 10/21/2019   Procedure: ESOPHAGOGASTRODUODENOSCOPY (EGD) WITH PROPOFOL;  Surgeon: Irene Shipper, MD;  Location[facility] ENDOSCOPY;  Service: Endoscopy;  Laterality: N/A;  with small bowel BX's  . [place]  2002  . LAPAROTOMY N/A 10/24/2019   Procedure: EXPLORATORY LAPAROTOMY;  Surgeon: Clovis Riley, MD;  Location: [facility];  Service: General;  Laterality: N/A;  . LAPAROTOMY N/A 11/04/2019   Procedure: EXPLORATORY LAPAROTOMY FOR DEHISCENCE;  Surgeon: Donnie Mesa, MD;  Location: [facility];  Service: General;  Laterality: N/A;  . VENTRAL HERNIA REPAIR N/A 12/05/2018   Procedure: LAPAROSCOPIC VENTRAL HERNIA REPAIR WITH MESH;  Surgeon: Jovita Kussmaul, MD;  Location: [facility];  Service: General;  Laterality: N/A;   Family History  Problem Relation Age of Onset  . Breast cancer Neg Hx    Social History:  reports that she has quit smoking. She has never used smokeless tobacco. She reports current alcohol use. She reports that she does not use drugs. Allergies: No Known Allergies Medications Prior to Admission  Medication Sig Dispense Refill  . Calcium Carb-Cholecalciferol (CALCIUM+D3 PO)  Take 1 tablet by mouth daily.    . Cholecalciferol (VITAMIN D-3) 125 MCG (5000 UT) TABS Take 5,000 Units by mouth daily.    . ciprofloxacin (CIPRO) 500 MG tablet Take 1 tablet (500 mg total) by mouth 2 (two) times daily for 1 day. Start taking tonight 8/23 3 tablet 0  . [START ON 11/24/2019] escitalopram (LEXAPRO) 10 MG tablet Take 1 tablet (10 mg total) by mouth daily. 30 tablet 0  . gabapentin (NEURONTIN) 100 MG capsule Take 2 capsules (200 mg total) by mouth 2 (two) times daily. 120 capsule 0  . loperamide (IMODIUM A-D) 2 MG tablet Take 1 tablet (2 mg total) by mouth 4 (four) times daily as needed for diarrhea or loose stools. 30 tablet 0  . methocarbamol (ROBAXIN) 500 MG tablet Take 2 tablets (1,000 mg total) by mouth every 8 (eight) hours. 180 tablet 0  . Multiple Vitamin (MULTIVITAMIN WITH MINERALS) TABS tablet Take 1 tablet by mouth daily. Centrum Silver    . [START ON 11/24/2019] pantoprazole (PROTONIX) 40 MG tablet Take 1 tablet (40 mg total) by mouth daily at 6 (six) AM. 30 tablet 0  . [START ON 11/24/2019] predniSONE (DELTASONE) 20 MG tablet Take 1 tablet (20 mg total) by mouth daily before breakfast. 30 tablet 0    Drug Regimen Review Drug regimen was reviewed and remains appropriate with no significant issues identified  Home: Home Living Family/patient expects to be discharged to:: Private residence Living Arrangements: Spouse/significant other, Children Available Help at Discharge: Family Type of Home: House Home Access: Stairs to enter [employer] of Steps: couple Home Layout: One level Bathroom Shower/Tub: Multimedia programmer:  Standard Home Equipment: None Additional Comments: Pt reports spouse works from home    Functional History: Prior Function Level of Independence: Independent Comments: Pt reports she likes to clean and "do all kinds of stuff".  She does not work   Functional Status:  Mobility: Bed Mobility Overal bed mobility: Needs  Assistance Bed Mobility: Supine to Sit, Sit to Supine Rolling: Supervision Sidelying to sit: Min assist, HOB elevated Supine to sit: Supervision Sit to supine: Min guard Sit to sidelying: Mod assist, +2 for safety/equipment General bed mobility comments: pt using bed rail to roll toward R side then reaching for therapist with L UE to pull self up into static sitting. pt reports little dizziness. pt allowed prolonged sitting ~45 second with symptoms resolving   Transfers Overall transfer level: Needs assistance Equipment used: Rolling walker (2 wheeled), 1 person hand held assist Transfer via [place]: Stedy Transfers: Sit to/from Stand Sit to Stand: Mod assist Stand pivot transfers: Min assist Squat pivot transfers: Max assist General transfer comment: 1st attempt with use of RW pt unable to complete stand with PT assist. PT then provides L knee block for stand attempts 2-4 and R knee block for attempt 5. Pt requires assist to power up and verbal cues to extend trunk. PT assists pt in pre-gait weight shifting exercise with one instance of R knee buckling and multiple losses of balance Ambulation/Gait Ambulation/Gait assistance: Min assist Gait Distance (Feet): 3 Feet Assistive device: Rolling walker (2 wheeled) Gait Pattern/deviations: Step-to pattern General Gait Details: unable  ADL: ADL Overall ADL's : Needs assistance/impaired Eating/Feeding: Set up, Bed level Grooming: Set up, Sitting Grooming Details (indicate cue type and reason): setup/S at EOB to wash face, max A to comb/brush hair (tangles and fatigued easily) in recliner Upper Body Bathing: Moderate assistance, Sitting Lower Body Bathing: Maximal assistance, Sit to/from stand Upper Body Dressing : Moderate assistance, Sitting Lower Body Dressing: Moderate assistance, Bed level Lower Body Dressing Details (indicate cue type and reason): pt using bil UE to pull leg up and OT to (A) with figure 4. pt able to loop sock  over toes and pull on socks Toilet Transfer: Maximal assistance Toilet Transfer Details (indicate cue type and reason): simulated sit<>Stand from bed with stedy Toileting- Clothing Manipulation and Hygiene: Total assistance Toileting - Clothing Manipulation Details (indicate cue type and reason): colostomy and purewick  Functional mobility during ADLs: Moderate assistance, Cueing for safety, Cueing for sequencing General ADL Comments: pt completed transfer from supine to sit then sit<>Stand x4 in stedy this session  Cognition: Cognition Overall Cognitive Status: Within Functional Limits for tasks assessed Orientation Level: Oriented X4 Cognition Arousal/Alertness: Awake/alert Behavior During Therapy: WFL for tasks assessed/performed Overall Cognitive Status: Within Functional Limits for tasks assessed Area of Impairment: Problem solving, Awareness, Safety/judgement Following Commands: Follows one step commands with increased time Safety/Judgement: Decreased awareness of deficits, Decreased awareness of safety Awareness: Intellectual Problem Solving: Requires verbal cues General Comments: Pt requires frequent and repeated cuing for breathing technique and mobility, pt limited by anxiety. Pt states "I think I am going to have a panic attack" sitting EOB, improved with slowed breathing and rest.   Physical Exam: Blood pressure 103/63, pulse (!) 105, temperature 98.7 F (37.1 C), resp. rate 15, last menstrual period 12/15/2014, SpO2 98 %. General: Alert and oriented x 3, No apparent distress HEENT: Head is normocephalic, atraumatic, PERRLA, EOMI, sclera anicteric, oral mucosa pink and moist, dentition intact, ext ear canals clear,  Neck: Supple without JVD or lymphadenopathy  Heart: Reg rate and rhythm. No murmurs rubs or gallops Chest: CTA bilaterally without wheezes, rales, or rhonchi; no distress Abdomen:  Abdominal wound with colostomy in place as well as retention sutures.  JP  drain.  Wet-to-dry dressing  Extremities: No clubbing, cyanosis, or edema. Pulses are 2+ Skin: Left arm bruising Neuro: Patient is alert.  Complaints of abdominal pain oriented x3 and follows commands. 4/5 strength in BUE and 3/5 strength in BLE Psych: Pt's affect is appropriate. Pt is cooperative. Situational depression   Results for orders placed or performed during the hospital encounter of 10/18/19 (from the past 48 hour(s))  CBC     Status: Abnormal   Collection Time: 11/22/19  5:00 AM  Result Value Ref Range   WBC 6.9 4.0 - 10.5 K/uL   RBC 2.73 (L) 3.87 - 5.11 MIL/uL   Hemoglobin 8.6 (L) 12.0 - 15.0 g/dL   HCT 28.4 (L) 36 - 46 %   MCV 104.0 (H) 80.0 - 100.0 fL   MCH 31.5 26.0 - 34.0 pg   MCHC 30.3 30.0 - 36.0 g/dL   RDW 19.4 (H) 11.5 - 15.5 %   Platelets 410 (H) 150 - 400 K/uL   nRBC 0.0 0.0 - 0.2 %    Comment: Performed at [facility], [address] [place], [place] [address]  Basic metabolic panel     Status: Abnormal   Collection Time: 11/22/19  5:00 AM  Result Value Ref Range   Sodium 133 (L) 135 - 145 mmol/L   Potassium 3.5 3.5 - 5.1 mmol/L   Chloride 100 98 - 111 mmol/L   CO2 24 22 - 32 mmol/L   Glucose, Bld 83 70 - 99 mg/dL    Comment: Glucose reference range applies only to samples taken after fasting for at least 8 hours.   BUN <5 (L) 6 - 20 mg/dL   Creatinine, Ser 0.34 (L) 0.44 - 1.00 mg/dL   Calcium 7.9 (L) 8.9 - 10.3 mg/dL   GFR calc non Af Amer >60 >60 mL/min   GFR calc Af Amer >60 >60 mL/min   Anion gap 9 5 - 15    Comment: Performed at [facility] [address], [place], [place] [address]  CBC     Status: Abnormal   Collection Time: 11/23/19  7:12 AM  Result Value Ref Range   WBC 8.0 4.0 - 10.5 K/uL   RBC 2.68 (L) 3.87 - 5.11 MIL/uL   Hemoglobin 8.6 (L) 12.0 - 15.0 g/dL   HCT 27.5 (L) 36 - 46 %   MCV 102.6 (H) 80.0 - 100.0 fL   MCH 32.1 26.0 - 34.0 pg   MCHC 31.3 30.0 - 36.0 g/dL   RDW 19.5 (H) 11.5 - 15.5 %   Platelets 414 (H)  150 - 400 K/uL   nRBC 0.0 0.0 - 0.2 %    Comment: Performed at [facility] [address], [place], [place] [address]  Basic metabolic panel     Status: Abnormal   Collection Time: 11/23/19  7:12 AM  Result Value Ref Range   Sodium 134 (L) 135 - 145 mmol/L   Potassium 4.3 3.5 - 5.1 mmol/L   Chloride 102 98 - 111 mmol/L   CO2 25 22 - 32 mmol/L   Glucose, Bld 89 70 - 99 mg/dL    Comment: Glucose reference range applies only to samples taken after fasting for at least 8 hours.   BUN 5 (L)  6 - 20 mg/dL   Creatinine, Ser 0.31 (L) 0.44 - 1.00 mg/dL   Calcium 8.3 (L) 8.9 - 10.3 mg/dL   GFR calc non Af Amer >60 >60 mL/min   GFR calc Af Amer >60 >60 mL/min   Anion gap 7 5 - 15    Comment: Performed at [facility] [address] [place], [place] [address]  Magnesium     Status: None   Collection Time: 11/23/19  7:12 AM  Result Value Ref Range   Magnesium 1.8 1.7 - 2.4 mg/dL    Comment: Performed at [facility] [address] [place], [place] [address]  Phosphorus     Status: None   Collection Time: 11/23/19  7:12 AM  Result Value Ref Range   Phosphorus 3.8 2.5 - 4.6 mg/dL    Comment: Performed at [facility] [address], [place], [place] [address]   No results found.     Medical Problem List and Plan: 1.  Debility secondary to Crohn's disease/diverticulitis status post exploratory laparotomy with Hartman's procedure 06/29/5186 complicated by fascial dehiscence mesenteric hematoma pelvic abscess and mucocutaneous separation of colostomy status post exploratory laparotomy colostomy revision drain placement and wound VAC 07/01/6604 further complicated by development of intra-abdominal fluid collection status post right lower quadrant drain placement 11/11/2019 per interventional radiology Dr. Kathlene Cote.  Continue prednisone 20 mg daily for Crohn's disease  -patient may shower (JP drains must be covered). Eager to take a shower  -ELOS/Goals: min A in 10-14  days. 2.  Antithrombotics: -DVT/anticoagulation: Lovenox  -antiplatelet therapy: N/A 3. Pain Management: Neurontin 200 mg twice daily, Lidoderm patch as directed, Robaxin 1000 mg every 8 hours, tramadol as needed 4. Mood: Lexapro 10 mg daily, melatonin 3 mg nightly as needed  -antipsychotic agents: N/A 5. Neuropsych: This patient is capable of making decisions on her own behalf. 6. Skin/Wound Care: Routine skin checks 7. Fluids/Electrolytes/Nutrition: Routine in and outs with follow-up chemistries 8.  ID/intra-abdominal abscess.  Currently maintained on Cipro 500 mg twice daily 9.  Acute blood loss anemia.  Hgb 8.6 today. Follow-up CBC 10.  Hypotension.  Monitor with increased mobility. Decreasing K+ may help BP increase (see#11). Continue TED stockings and abdominal binder during therapy sessions- she has been feeling dizzy. Daily orthostatic vital signs ordered. She would prefer to take Tramadol after therapy so it does not drop her BP as much- advised that this would be ok, it is ordered as PRN.  11. Hypokalemia: 4.3 on 8/23 and has been normal last 4 reads. Decrease K+ supplement to 63mq daily and repeat K+ tomorrow.   DHelyn Numbers PA-C  I have personally performed a face to face diagnostic evaluation, including, but not limited to relevant history and physical exam findings, of this patient and developed relevant assessment and plan.  Additionally, I have reviewed and concur with the physician assistant's documentation above.  The patient's status has not changed. The original post admission physician evaluation remains appropriate, and any changes from the pre-admission screening or documentation from the acute chart are noted above.   KLeeroy Cha MD

## 2019-11-23 NOTE — Progress Notes (Signed)
PMR Admission Coordinator Pre-Admission Assessment   Patient: Natasha Barrera is an 54 y.o., female MRN: [id number] DOB: 03-26-1966 Height: 5' 1"  (154.9 cm) Weight: 50.6 kg   Insurance Information HMO:      PPO: Yes     PCP:       IPA:       80/20:       OTHER: Open Access PRIMARY: [employer] NAP      Policy#: [id number]      Subscriber: [place] Name:       Phone#:      Fax#: [phone] Pre-Cert#: [id number]      Employer: Husband works FT Benefits:  Phone #: [phone]     Name: Online at [email or web address] provider portal Eff. Date: 04/03/19     Deduct: $5000 (met $5000)      Out of Pocket Max: [phone] (met $8122.20)      Life Max: N/A CIR: 70% with auth      SNF: 70% with 60 day limit Outpatient: 70% limited to 60 combined visits of Pt/OT/SLP     Co-Pay: 30% Home Health: 70% with 60 visit limit      Co-Pay: 30% DME: 70%     Co-Pay: 30% Providers: in network  SECONDARY:   None      Policy#:       Phone#:     Development worker, community:        Phone#:     The Engineer, petroleum" for patients in Inpatient Rehabilitation Facilities with attached "Privacy Act Dante Records" was provided and verbally reviewed with: N/A   Emergency Contact Information         Contact Information     Name Relation Home Work Mobile    Atlas D Spouse     [phone]    Ivannah, Zody Daughter     [phone]         Current Medical History  Patient Admitting Diagnosis:Colon perforation   History of Present Illness: Natasha Barrera is a 54 year old right-handed female with history of hypertension, diverticulitis status post sigmoid colectomy 2013, ventral hernia repair, tobacco use.  Patient with recent admission 10/02/2019 to 10/04/2019 for complaints of abdominal discomfort diarrhea and weight loss found to be hypotensive with electrolyte derangements.  CT abdomen pelvis at that time revealed diffuse colitis involving the ascending, transverse and descending colon.   GI was consulted recommendations inpatient colostomy and stool studies.  Patient and her husband refused to stay for complete work-up and left AMA.  Since her discharge of 10/04/2019 continued experiencing worsening fatigue poor appetite loose stools as well as some swelling lower extremities.  Patient presented back 10/18/2019 with increasing abdominal pain.  Blood pressure 97/65 hemoglobin 10.4 platelet 593 sodium 123 chloride 86 potassium 3.5 total protein 4.4, total bilirubin 1.3.  CTA unremarkable.  GI services consulted Dr. Henrene Pastor underwent colonoscopy/upper GI endoscopy 10/21/2019 that showed the terminal ileum appeared normal.  There was scarring and mucosal bridging as well as scattered areas of ulceration throughout the entire colon.  There was evidence of prior sigmoid colectomy and a few diverticula.  EGD was unremarkable.  Findings favored Crohn's colitis.  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 Kae Heller.  Postoperative course complicated by hypotension with follow-up for critical care maintained on IV hydration as well as Levophed.  She did require IV heparin for thrombosis and she did develop subsequent large mesenteric hematoma 10/30/2019 and IV heparin discontinued.  On 11/04/2019 she had facial dehiscence with visible transverse colon underwent exploratory laparotomy colostomy revision, fascial closure, placement of 4 retention sutures and wound VAC along with right JP drain placement.  As of 11/11/2019 findings of right abdominal abscess status post IR drain per Dr.Yamagata  As noted cultures Klebsiella and Pseudomonas and initially maintained on Zosyn switched to Cipro 11/18/2019.  Patient undergoing twice daily wet-to-dry dressing changes.  She was cleared to begin subcutaneous Lovenox for DVT prophylaxis.  Patient with slow progressive gains acute blood loss anemia latest hemoglobin 8.4, sodium 135. Pt. To be discharged home with JP drains in place, per MD.    Patient's medical record from [facility] has been reviewed by the rehabilitation admission coordinator and physician.   Past Medical History      Past Medical History:  Diagnosis Date  . Diverticul disease small and large intestine, no perforati or abscess    . Diverticulitis of colon    . Hypertension        Family History   family history is not on file.   Prior Rehab/Hospitalizations Has the patient had prior rehab or hospitalizations prior to admission? No   Has the patient had major surgery during 100 days prior to admission? Yes              Current Medications   Current Facility-Administered Medications:  .  (feeding supplement) PROSource Plus liquid 30 mL, 30 mL, Oral, Daily, Axel Filler, MD, 30 mL at 11/18/19 0830 .  0.9 %  sodium chloride infusion, , Intravenous, PRN, Donnie Mesa, MD, Last Rate: 0 mL/hr at 11/21/19 0701, Rate Verify at 11/21/19 0701 .  acetaminophen (TYLENOL) tablet 650 mg, 650 mg, Oral, Q6H, Simaan, Darci Current, PA-C, 650 mg at 11/23/19 0440 .  Chlorhexidine Gluconate Cloth 2 % PADS 6 each, 6 each, Topical, Daily, Donnie Mesa, MD, 6 each at 11/22/19 1522 .  ciprofloxacin (CIPRO) tablet 500 mg, 500 mg, Oral, BID, Meuth, Brooke A, PA-C, 500 mg at 11/23/19 0950 .  enoxaparin (LOVENOX) injection 40 mg, 40 mg, Subcutaneous, Q24H, Allred, Darrell K, PA-C, 40 mg at 11/21/19 1600 .  escitalopram (LEXAPRO) tablet 10 mg, 10 mg, Oral, Daily, Pashayan, Redgie Grayer, MD, 10 mg at 11/23/19 0950 .  feeding supplement (ENSURE ENLIVE) (ENSURE ENLIVE) liquid 237  mL, 237 mL, Oral, QID, Romana Juniper A, MD, 237 mL at 11/23/19 0950 .  gabapentin (NEURONTIN) capsule 200 mg, 200 mg, Oral, BID, Meuth, Brooke A, PA-C, 200 mg at 11/23/19 0949 .  guaiFENesin (MUCINEX) 12 hr tablet 600 mg, 600 mg, Oral, BID PRN, Meuth, Brooke A, PA-C, 600 mg at 11/08/19 1010 .  HYDROmorphone (DILAUDID) injection 0.5-1 mg, 0.5-1 mg, Intravenous, Q4H PRN, Meuth, Brooke A, PA-C, 0.5 mg at 11/10/19 2023 .  lidocaine (LIDODERM) 5 % 1 patch, 1 patch, Transdermal, Q24H, Simaan, Darci Current, PA-C, 1 patch at 11/20/19 1219 .  melatonin tablet 3 mg, 3 mg, Oral, QHS PRN, Meuth, Brooke A, PA-C, 3 mg at 11/19/19 2146 .  methocarbamol (ROBAXIN) tablet 1,000 mg, 1,000 mg, Oral, Q8H, Meuth, Brooke A, PA-C, 1,000 mg at 11/23/19 0440 .  multivitamin with minerals tablet 1 tablet, 1  tablet, Oral, Daily, Clovis Riley, MD, 1 tablet at 11/23/19 [phone] .  ondansetron (ZOFRAN) injection 4 mg, 4 mg, Intravenous, Q6H PRN, Donnie Mesa, MD, 4 mg at 11/05/19 0008 .  pantoprazole (PROTONIX) EC tablet 40 mg, 40 mg, Oral, Q0600, Donnie Mesa, MD, 40 mg at 11/23/19 0440 .  potassium chloride SA (KLOR-CON) CR tablet 40 mEq, 40 mEq, Oral, Daily, Mosetta Anis, MD, 40 mEq at 11/23/19 0949 .  predniSONE (DELTASONE) tablet 20 mg, 20 mg, Oral, QAC breakfast, Vena Rua, PA-C, 20 mg at 11/23/19 0949 .  sodium chloride flush (NS) 0.9 % injection 10-40 mL, 10-40 mL, Intracatheter, Q12H, Donnie Mesa, MD, 10 mL at 11/22/19 2202 .  sodium chloride flush (NS) 0.9 % injection 10-40 mL, 10-40 mL, Intracatheter, PRN, Donnie Mesa, MD .  sodium chloride flush (NS) 0.9 % injection 3 mL, 3 mL, Intravenous, Once, Donnie Mesa, MD .  sodium chloride flush (NS) 0.9 % injection 5 mL, 5 mL, Intracatheter, Q8H, Aletta Edouard, MD, 5 mL at 11/23/19 0440 .  traMADol (ULTRAM) tablet 50-100 mg, 50-100 mg, Oral, Q6H PRN, Jill Alexanders, PA-C, 100 mg at 11/16/19 1104 .  vitamin A capsule 10,000 Units, 10,000 Units,  Oral, Daily, Donnie Mesa, MD, 10,000 Units at 11/23/19 1191   Patients Current Diet:     Diet Order                      DIET SOFT Room service appropriate? Yes; Fluid consistency: Thin  Diet effective now                      Precautions / Restrictions Precautions Precautions: Fall, Other (comment) Precaution Comments: Jp drain x2 Right side/ ostomy L side / abdomen binder when up Other Brace: abdominal binder when OOB Restrictions Weight Bearing Restrictions: No    Has the patient had 2 or more falls or a fall with injury in the past year? No   Prior Activity Level Limited Community (1-2x/wk): Went out 3-4 X a week, was driving.   Prior Functional Level Self Care: Did the patient need help bathing, dressing, using the toilet or eating? Independent   Indoor Mobility: Did the patient need assistance with walking from room to room (with or without device)? Independent   Stairs: Did the patient need assistance with internal or external stairs (with or without device)? Independent   Functional Cognition: Did the patient need help planning regular tasks such as shopping or remembering to take medications? Independent   Home Assistive Devices / Equipment Home Assistive Devices/Equipment: None Home Equipment: None   Prior Device Use: Indicate devices/aids used by the patient prior to current illness, exacerbation or injury? None   Current Functional Level Cognition   Overall Cognitive Status: Impaired/Different from baseline Orientation Level: Oriented X4 Following Commands: Follows one step commands with increased time Safety/Judgement: Decreased awareness of deficits, Decreased awareness of safety General Comments: pt with poor recall of HEP for UB provided previous session. pt provided medibridge access code    Extremity Assessment (includes Sensation/Coordination)   Upper Extremity Assessment: Generalized weakness RUE Deficits / Details: 4 / 5 MMT  FULL AROM LUE  Deficits / Details: 4 / 5 MMT  FULL AROM  Lower Extremity Assessment: Defer to PT evaluation, RLE deficits/detail, LLE deficits/detail RLE Deficits / Details: reports not feeling feet in standing LLE Deficits / Details: reports not feeling feet in standing     ADLs   Overall ADL's :  Needs assistance/impaired Eating/Feeding: Modified independent, Sitting Eating/Feeding Details (indicate cue type and reason): drinking ensure at the end of session Grooming: Wash/dry hands, Wash/dry face, Set up, Sitting Grooming Details (indicate cue type and reason): setup/S at EOB to wash face, max A to comb/brush hair (tangles and fatigued easily) in recliner Upper Body Bathing: Set up, Sitting Upper Body Bathing Details (indicate cue type and reason): pt requires pause between sides due to decr endurance Lower Body Bathing: Moderate assistance, Sit to/from stand Lower Body Bathing Details (indicate cue type and reason): pt able to use bil UE to bend LE into knee flexion to wash leg but requires one UE to hold the leg due to weakness. Pt able to complete anterior peri care supine. Pt sit<>Stand in stedy x2 for posterior peri care and barrier cream application. pt sustained 1 minute static stand. pt able to reach with L UE to pull on mesh underwear Upper Body Dressing : Minimal assistance, Sitting Upper Body Dressing Details (indicate cue type and reason): due to drains and abdomen binder requires increased (A) at the moment Lower Body Dressing: Moderate assistance, Bed level Lower Body Dressing Details (indicate cue type and reason): pt using bil UE to pull leg up and OT to (A) with figure 4. pt able to loop sock over toes and pull on socks Toilet Transfer: Maximal assistance Toilet Transfer Details (indicate cue type and reason): simulated sit<>Stand from bed with stedy Toileting- Clothing Manipulation and Hygiene: Total assistance Toileting - Clothing Manipulation Details (indicate cue type and reason):  colostomy and purewick  Functional mobility during ADLs: Moderate assistance, Cueing for safety, Cueing for sequencing General ADL Comments: pt completed transfer from supine to sit then sit<>Stand x4 in stedy this session     Mobility   Overal bed mobility: Needs Assistance Bed Mobility: Supine to Sit Rolling: Supervision Sidelying to sit: Min assist, HOB elevated Supine to sit: Min assist, HOB elevated Sit to supine: Min guard Sit to sidelying: Mod assist, +2 for safety/equipment General bed mobility comments: oob in chair on arrival     Transfers   Overall transfer level: Needs assistance Equipment used: Rolling walker (2 wheeled), 1 person hand held assist Transfer via [place]: Stedy Transfers: Sit to/from Stand Sit to Stand: Mod assist Stand pivot transfers: Min assist Squat pivot transfers: Max assist  Lateral/Scoot Transfers: Mod assist General transfer comment: pt requires therapist to help anterior lift patient into the stedy. pt pulling up and initiating the task     Ambulation / Gait / Stairs / Wheelchair Mobility   Ambulation/Gait Ambulation/Gait assistance: Herbalist (Feet): 3 Feet Assistive device: Rolling walker (2 wheeled) Gait Pattern/deviations: Step-to pattern General Gait Details: unable     Posture / Balance Dynamic Sitting Balance Sitting balance - Comments: requires use of bil UE Balance Overall balance assessment: Needs assistance Sitting-balance support: No upper extremity supported, Feet supported Sitting balance-Leahy Scale: Fair Sitting balance - Comments: requires use of bil UE Standing balance support: Bilateral upper extremity supported Standing balance-Leahy Scale: Poor Standing balance comment: modA to maintain static standing, max with any weight shift     Special needs/care consideration Wound Vac placed 8/5 Skin: surgical incision to the abdomen, Ecchymosis of the BUEs, Skin tear to the sacrum and  buttocks   Special service needs: Pt. With ostomy bag Designated visitor Tamera Pingley (daughter)    Previous Home Environment (from acute therapy documentation) Living Arrangements: Spouse/significant other, Children Available Help at Discharge: Family Type of Home: House  Home Layout: One level Home Access: Stairs to enter [employer] of Steps: couple Bathroom Shower/Tub: Multimedia programmer: Standard Home Care Services: No Additional Comments: Pt reports spouse works from home    Discharge Living Setting Plans for Discharge Living Setting: Patient's home, House, Lives with (comment) (Lives with husband, Mikki Santee.) Type of Home at Discharge: House Discharge Home Layout: One level Discharge Home Access: Stairs to enter Entrance Stairs-Number of Steps: 3 steps up to deck Discharge Bathroom Shower/Tub: Walk-in shower, Curtain Discharge Bathroom Toilet: Standard Discharge Bathroom Accessibility: Yes How Accessible: Accessible via walker Does the patient have any problems obtaining your medications?: No   Social/Family/Support Systems Patient Roles: Spouse, Parent (Has a husband and 3 adult children.) Contact Information: Henslee Lottman - husband Anticipated Caregiver: husband Anticipated Caregiver's Contact Information: Mikki Santee - husband - [phone] Ability/Limitations of Caregiver: Husband works from home and can assist. Caregiver Availability: 24/7 Discharge Plan Discussed with Primary Caregiver: Yes Is Caregiver In Agreement with Plan?: Yes Does Caregiver/Family have Issues with Lodging/Transportation while Pt is in Rehab?: No   Goals Patient/Family Goal for Rehab: PT/OT mod I and supervision goals Expected length of stay: 7-10 days Cultural Considerations: None Pt/Family Agrees to Admission and willing to participate: Yes Program Orientation Provided & Reviewed with Pt/Caregiver Including Roles  & Responsibilities: Yes   Decrease burden of Care through IP rehab  admission: N/A   Possible need for SNF placement upon discharge: Not anticipated   Patient Condition: I have reviewed medical records from [facility], spoken with CSW, and patient and family member. I met with patient at the bedside for inpatient rehabilitation assessment.  Patient will benefit from ongoing PT and OT, can actively participate in 3 hours of therapy a day 5 days of the week, and can make measurable gains during the admission.  Patient will also benefit from the coordinated team approach during an Inpatient Acute Rehabilitation admission.  The patient will receive intensive therapy as well as Rehabilitation physician, nursing, social worker, and care management interventions.  Due to safety, skin/wound care, disease management, medication administration, pain management and patient education the patient requires 24 hour a day rehabilitation nursing.  The patient is currently  Mod A with mobility and basic ADLs.  Discharge setting and therapy post discharge at home with home health is anticipated.  Patient has agreed to participate in the Acute Inpatient Rehabilitation Program and will admit tomorrow.   Preadmission Screen Completed By:  Genella Mech, 11/23/2019 10:07 AM ______________________________________________________________________   Discussed status with Dr. Ranell Patrick on 11/23/19 at 17 and received approval for admission tomorrow.   Admission Coordinator:  Karene Fry, RN Updates provided by Clemens Catholic, MS CCC-SLP, time 1000 /Date 11/23/2019   Assessment/Plan: Diagnosis: Debility       1. Does the need for close, 24 hr/day Medical supervision in concert with the patient's rehab needs make it unreasonable for this patient to be served in a less intensive setting? Yes 2. Co-Morbidities requiring supervision/potential complications: colon perforation, Chron's disease of large colon without complication, malnutrition of moderate degree, pressure injury of th skin,  peritoneal hematoma, renal infarction, acute blood loss anemia, E. Coli bacteremia, hypokalemia 3. Due to bladder management, bowel management, safety, skin/wound care, disease management, medication administration, pain management and patient education, does the patient require 24 hr/day rehab nursing? Yes 4. Does the patient require coordinated care of a physician, rehab nurse, PT, OT, and SLP to address physical and functional deficits in the context of the above  medical diagnosis(es)? No Addressing deficits in the following areas: balance, endurance, locomotion, strength, transferring, bowel/bladder control, bathing, dressing, feeding, grooming, toileting, cognition and psychosocial support 5. Can the patient actively participate in an intensive therapy program of at least 3 hrs of therapy 5 days a week? Yes 6. The potential for patient to make measurable gains while on inpatient rehab is excellent 7. Anticipated functional outcomes upon discharge from inpatient rehab: min assist PT, min assist OT, independent SLP 8. Estimated rehab length of stay to reach the above functional goals is: 12-16 days 9. Anticipated discharge destination: Home 10. Overall Rehab/Functional Prognosis: good     MD Signature: Leeroy Cha, MD

## 2019-11-23 NOTE — Progress Notes (Addendum)
Changed ostomy bag last night. IV clotted and phlebotomy said they would collect blood for me

## 2019-11-23 NOTE — Discharge Summary (Signed)
Name: Natasha Barrera MRN: [id number] DOB: 21-Jun-1965 54 y.o. PCP: Natasha Axon, MD  Date of Admission: 10/18/2019  4:16 PM Date of Discharge: 11/23/2019 Attending Physician: Natasha Contes, MD  Discharge Diagnosis: 1. Crohn's Disease/Bowel Perforation/Colectomy and Ostomy Formation 2. 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 Infrarenal aortic thrombus (resolved) 4. Abdominal Hematoma (resolved) 5. E coli. Bacteremia (resolved) 6. Adjustment Disorder with Depressed Mood   Discharge Medications: Allergies as of 11/23/2019   No Known Allergies     Medication List    TAKE these medications   CALCIUM+D3 PO Take 1 tablet by mouth daily.   ciprofloxacin 500 MG tablet Commonly known as: CIPRO Take 1 tablet (500 mg total) by mouth 2 (two) times daily for 1 day. Start taking tonight 8/23   escitalopram 10 MG tablet Commonly known as: LEXAPRO Take 1 tablet (10 mg total) by mouth daily. Start taking on: November 24, 2019   gabapentin 100 MG capsule Commonly known as: NEURONTIN Take 2 capsules (200 mg total) by mouth 2 (two) times daily.   loperamide 2 MG tablet Commonly known as: Imodium A-D Take 1 tablet (2 mg total) by mouth 4 (four) times daily as needed for diarrhea or loose stools.   methocarbamol 500 MG tablet Commonly known as: ROBAXIN Take 2 tablets (1,000 mg total) by mouth every 8 (eight) hours.   multivitamin with minerals Tabs tablet Take 1 tablet by mouth daily. Centrum Silver   pantoprazole 40 MG tablet Commonly known as: PROTONIX Take 1 tablet (40 mg total) by mouth daily at 6 (six) AM. Start taking on: November 24, 2019   predniSONE 20 MG tablet Commonly known as: DELTASONE Take 1 tablet (20 mg total) by mouth daily before breakfast. Start taking on: November 24, 2019   Vitamin D-3 125 MCG (5000 UT) Tabs Take 5,000 Units by mouth daily.            Durable Medical  Equipment  (From admission, onward)         Start     Ordered   11/17/19 0839  For home use only DME 3 n 1  Once        11/17/19 0841   11/17/19 0839  For home use only DME Walker rolling  Once       Question Answer Comment  Walker: With 5 Inch Wheels   Patient needs a walker to treat with the following condition Status post exploratory laparotomy   Patient needs a walker to treat with the following condition Perforated sigmoid colon ([place])      11/17/19 0841           Discharge Care Instructions  (From admission, onward)         Start     Ordered   11/23/19 0000  Discharge wound care:       Comments: Wound 10/25/11 Blister (Serous filled) Buttocks Right blister 2950 days   Incision - 4 Ports Abdomen 1: Right;Upper 2: Right;Mid 3: Right;Lower 4: Left;Lower 2889 days  Incision - 2 Ports Abdomen 1: Right;Lateral 2: Right;Upper 353 days  Pressure Injury 10/29/19 Buttocks Right;Left Stage 1 -  Intact skin with non-blanchable redness of a localized area usually over a bony prominence. inner buttocks L and R; reddened non-blanchable area approximately 1inch x 1 inch area 25 days  Incision (Closed) 11/04/19 Abdomen Other (Comment) 18 days  Pressure Injury 11/09/19 Buttocks Right Stage 2 -  Partial thickness loss of dermis presenting as  a shallow open injury with a red, pink wound bed without slough. pink/[place] wound bed   Wound care  Every shift      Comments: Damp to dry dressing changes to midline wound. Change BID   11/23/19 1028          Disposition and follow-up:   NatashaZi D Barrera was discharged from [facility] in Stable condition.  At the hospital follow up visit please address:  1.  Crohn's Disease/Bowel Perforation/Colectomy and Ostomy Formation: Was discharged on 20 mg Prednisone daily. Will follow up with GI for further management and possible Biologics use. Ensure Ostomy changing continues to go well.   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 Ensure she is continuing to increase oral intake.   Infrarenal aortic thrombus (resolved): No further work up needed.   Abdominal Hematoma (resolved): Was given 14 days of antibiotics for clearance of infection. Ensure no more purulent drainage present in drain.   E coli. Bacteremia (resolved): No further work up needed.   Adjustment Disorder with Depressed Mood: Has responded well to 10 mg Escitalopram. Reassess mood and continued need for medication.    2.  Labs / imaging needed at time of follow-up: BMP, CBC, Liver Panel  3.  Pending labs/ test needing follow-up: None  Follow-up Appointments:  Follow-up Information    Clovis Riley, MD. Go on 12/10/2019.   Specialty: General Surgery Why: Your appointment is 09/09 at 9:10 am Please arrive 30 minutes prior to your appointment to check in and fill out paperwork. Bring photo ID and insurance information. Contact information: [place] [id number] [address] by problem list: 1. Crohn's Disease/Bowel Perforation/Colectomy and Ostomy Formation: Diagnosed 7/21. Was started on 40 mg IV Solumedrol q12. Was tapered down over the next few weeks. On 7/23 CT found diffuse pneumoperitoneum. Had to undergo emergent Exploratory Lap with partial colectomy and ostomy formation. Then due to wound dehiscence underwent another exploratory Lap. All bowel was examined as found to be viable, ostomy was found to still be viable. Ostomy was reattached and wound more securely closed. On discharge was on Prednisone 20 mg oral daily. Will follow up with GI outpatient for biologics and further management. Had Bowel Perforation    2. 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 After starting Solumedrol diarrhea stopped. Continued to have occasional electrolyte abnormalities due to poor oral intake. Was on TPN and daily encouragement to eat and drink as much as tolerated. On discharge electrolyte abnormalities were corrected. Protein levels continued to be low but slightly improved as oral intake improved.   3. Infrarenal aortic thrombus (resolved): Was incidentally noted by CT on 7/23. Was restarted on Heparin and it resolved.   4. Abdominal Hematoma (resolved): Was discovered on CT on 7/30.It was found to be compressing the right ureter causing hydronephrosis. IR was consulted but it was determined to watch for any clinical changes. Was monitored closely for renal failure but urine output continued to be good with diuretics. CT on 8/11 showed gas collection concerning for infection. Under went IR drainage and placement of drain on 8/11. Started on antibiotics for 14 days total.   5. E coli. Bacteremia (resolved): Was discovered on blood  draw collected on 7/31 was started on antibiotics given 5 day course and resolved.   6. Adjustment Disorder with Depressed Mood: On 8/9 was started on 10 mg Escitalopram. Began to see positive improvement within a few days.    Discharge Vitals:   BP (!) 93/54 (BP Location: Left Arm)   Pulse 89   Temp 98.4 F (36.9 C) (Oral)   Resp 20   Ht 5' 1"  (1.549 m)   Wt 50.6 kg   LMP 12/15/2014   SpO2 97%   BMI 21.08 kg/m   Pertinent Labs, Studies, and Procedures:  BMP Latest Ref Rng & Units 11/23/2019 11/22/2019 11/21/2019  Glucose 70 - 99 mg/dL 89 83 80  BUN 6 - 20 mg/dL 5(L) <5(L) <5(L)  Creatinine 0.44 - 1.00 mg/dL 0.31(L) 0.34(L) 0.37(L)  Sodium 135 - 145 mmol/L 134(L) 133(L) 135  Potassium 3.5 - 5.1 mmol/L 4.3 3.5 3.5  Chloride 98 - 111 mmol/L 102 100 102  CO2 22 - 32 mmol/L 25 24 25   Calcium 8.9 - 10.3 mg/dL 8.3(L) 7.9(L) 8.3(L)   CBC Latest Ref Rng & Units  11/23/2019 11/22/2019 11/21/2019  WBC 4.0 - 10.5 K/uL 8.0 6.9 7.6  Hemoglobin 12.0 - 15.0 g/dL 8.6(L) 8.6(L) 8.5(L)  Hematocrit 36 - 46 % 27.5(L) 28.4(L) 27.5(L)  Platelets 150 - 400 K/uL 414(H) 410(H) 457(H)    CT Angio Chest PE W Contrast (7/18): No evidence of acute pulmonary artery filling defects to suggest pulmonary embolism. No convincing features of edema. Emphysema. Additional bronchitic changes could be acute or chronic, appearing similar to comparison abdominopelvic CT. Constellation of findings could reflect smoking related changes. Correlate with patient history. Coronary artery atherosclerosis.   DG Abdomin 1 View (7/23): Single mildly dilated small bowel overlying the left upper quadrant. This may represent focal ileus or early small bowel obstruction. Correlation with abdomen pelvis CT is recommended.   CT Abdomen Pelvis W Contrast (7/23): Extensive pneumoperitoneum, most consistent with colonic perforation given the persistent diffuse colitis. Surgical consultation is recommended. Interval development of focal mural thrombus within the infrarenal aorta, with less than 50% narrowing. Small bilateral pleural effusions. Diffuse hepatic steatosis.   DG Chest Port 1 View (7/27):  Changes of progressive congestive failure with effusions and parenchymal edema.   CT Angio Abdomen/Pelvis W and W/O Contrast (7/29):  VASCULAR Impression:- Thrombus along the right side of the infrarenal abdominal aorta has decreased in size compared to the exam on 10/23/2019. Suspect this is related to thromboembolic disease since it was not present on the exam from 10/02/2019. No new arterial thrombus in the abdomen or pelvis. Aortic Atherosclerosis.  NON-VASCULAR Impression- New peripheral wedge-shaped low-density area in the right kidney lower pole. Based on the arterial thrombus, suspect this is related to a small renal infarct. Small focus of pyelonephritis is also in the differential but thought to  be less likely. Extensive postoperative changes compatible with partial colectomy and creation of left abdominal colostomy. Scattered small fluid collections in the abdomen. These small fluid collections are poorly defined and could represent postoperative change. Consider surveillance to exclude developing abscess formations. Bilateral pleural effusions with extensive compressive atelectasis at the lung bases. Right pleural effusion has enlarged compared to the exam on 10/23/2019. Persistent wall thickening involving the right colon. Findings are suggestive for bowel inflammation   CT Abdomen Pelvis W Contrast (7/30): Large new hemorrhage/hematoma since yesterday in the right peritoneal cavity. Estimated blood volume of at least 800 mL. Hypovolemic appearance of the IVC. Hematoma mass effect  on the proximal duodenum and the right ureter. Numerous bilateral renal infarcts, substantially progressed from the CTA yesterday. New right hydronephrosis likely due to the compression of the ureter. Increased small volume fluid collection ventral to the uterus with simple fluid density and some associated foci of gas. New small foci of extraluminal gas along the caudal aspect of the hematoma near the uterine fundus. Dilated colostomy loop, and dilated stomach containing air in fluid. Although no other dilated bowel loops. Superimposed small volume of simple ascites in the abdomen and pelvis. Continued moderate to large layering pleural effusions.   CT Angio Chest Aorta W and W/O Contrast (7/31): No thoracic aortic aneurysm or evidence of aortic dissection. No intraluminal thrombus is seen within the thoracic aorta. Bilateral pleural effusions, moderate to large in size, with associated compressive atelectasis bilaterally. Additional mild scarring versus edema within each lung. Partially imaged fluid collection within the RIGHT upper abdomen, described as a large hematoma on CT abdomen of 10/30/2019, incompletely imaged  on this exam   CT Head W/O Contrast (8/04): No acute intracranial abnormalities. Diffuse cerebral atrophy.   CT Abdomen Pelvis W Contrast (8/04): Stable bilateral pleural effusions are noted with adjacent atelectasis of both lower lobes. Hypodense areas seen throughout both kidneys on prior exam are no longer visualized except for single ill-defined area in lower pole of right kidney. This may represent old infarction. Large peri-incisional hernia is noted in the epigastric region which contains a loop of transverse colon, but does not appear to be resulting in obstruction. Colostomy is noted in left lower quadrant. 8.3 x 3.5 cm air-fluid collection is seen anterior to the uterus in the pelvis which is enlarged compared to prior exam and concerning for abscess   DG Chest Port 1 View (8/04): Hazy opacities at the medial left lung base, likely atelectasis. Probable small left pleural effusion.   DG Chest Port 1 View (8/06): Increased LEFT pleural effusion and LEFT lung atelectasis causing subtotal opacification of the LEFT hemithorax. Question mucous plugging LEFT lower lobe.   DG Chest Port 1 View (8/07): Slight improvement in aeration of the LEFT UPPER hemithorax. Persistent significant opacity involving the LOWER half of the LEFT hemithorax.   DG Abdomen 1 View (8/11): Nonspecific but nonobstructed bowel gas pattern. Percutaneous drain remains in place to the pelvis.   CT Abdomen Pelvis W Contrast (8/11): Stable drainage catheter in the pelvis with a small amount of surrounding fluid and a few dots of air, not unexpected. Slight interval decrease in size of the large right-sided abdominal/pelvic hematoma. It does now contain a small amount of gas and there is also some rim enhancement. Findings worrisome for infected hematoma. Aspiration/drainage may be indicated. Stable left-sided pleural effusion with overlying atelectasis. Stable Hartmann's pouch and left lower quadrant colostomy without  complicating features. Stable age advanced vascular calcifications.   CT Abdomen Pelvis W Contrast (8/21): Complete resolution of the right mid abdominal abscess after pigtail drainage catheter placement. No recurrence or residual fluid collection. Trace free fluid within the paracolic gutters and lower pelvis. Postsurgical changes from laparotomy, distal colectomy, and left mid abdominal colostomy, stable. Small left pleural effusion and trace right pleural effusion, grossly stable. Nonobstructing right renal calculi. There is some mild distension of the proximal right ureter, likely due to extrinsic compression from residual inflammatory changes overlying the right psoas muscle. No hydronephrosis   Colonoscopy (7/21): The entire colon just above the distal rectum was grossly abnormal throughout. There was scarring and mucosal bridging as well  as cobblestoning and scattered areas of ulceration. The colon was edematous throughout. Biopsies were taken with a cold forceps for histology from the right-left half of the colon.   Biopsy from Colonoscopy (7/24): Active chronic Crohn's colitis with fissuring ulcers and perforation with associated serositis Margins are involved by the inflammatory process  Negative for granulomas or dysplasia    Upper Endoscopy (7/21): The esophagus revealed an incidental large caliber distal ring. Otherwise normal. The stomach was normal save small hiatal hernia. The examined duodenum was normal. Biopsies were taken with a cold forceps for histology. The cardia and gastric fundus were normal on retroflexion.   Echo (7/26): Left ventricular ejection fraction, by estimation, is 50 to 55%. The left ventricle has low normal function. The left ventricle has no regional wall motion abnormalities. All other structures grossly normal.   Echo (7/31): Left ventricular ejection fraction, by estimation, is 55 to 60%. The left ventricle has normal function. The left ventricle has no  regional wall motion abnormalities. Right ventricular systolic function is normal. The right ventricular size is normal. There is normal pulmonary artery systolic pressure. The mitral valve is normal in structure. No evidence of mitral valve regurgitation. No evidence of mitral stenosis. The aortic valve was not well visualized. Limited echo   Exploratory laparotomy, lysis of adhesions x60 minutes, segmental colectomy with end colostomy (7/24):The descending and sigmoid colon as well as the rectum were then visualized and there was a large, greater than 1 cm perforation of the anterior surface of the rectosigmoid area, likely in the region of prior anastomosis.  A contour green load stapler was then used to divide the bowel proximal to and distal to the perforation, the distal staple and is on the rectum. A colostomy site is created in the left upper quadrant in the sigmoid staple line brought up through this without tension.  A 19 French round Blake drain is placed in the pelvis, anterior to the rectal stump and along the left colic gutter. Once matured, the stoma appears viable, there is clearly abnormality of the mucosa with cobblestone and inflamed appearance. The stoma is patent through the fascia by digital examination.  A colostomy appliance was placed and a wet-to-dry dressing applied to the midline wound.  The drain is placed to bulb suction.   Fascial dehiscence, mesenteric hematoma, pelvic abscess, mucocutaneous separation of the colostomy (8/04): The colostomy shows approximately two thirds of the circumference with mucocutaneous separation.  There is no retraction of the colostomy and the colostomy seems to be viable. There is no sign of perforation or even thickening of the transverse colon.  All over the PDS sutures that were used to close the fascia had pulled through the fascia and were on the right side of the closure. The PDS sutures were removed. The liver and gallbladder appear normal.   The cecum ascending colon and transverse colon all appeared normal.  The descending colon heads up to the colostomy site with no tension.  There is no sign of ischemia in the colon.  We then identified the stomach.  This appeared viable and free of any ischemia or injury.  We identified the ligament of Treitz.  I then ran the bowel distally.  There are some chronic adhesions, likely secondary from previous surgeries.  However there are no acute adhesions. However the bowel all appears to be viable.  We then explored the pelvis.  There is a thick inflammatory rind lining the pelvis.  This covers the uterus.  I  did not identify the rectal stump.  There are no undrained abscess cavities.  The previously placed drain does not reach down into the pelvis so this was removed.  We debrided some of the inflammatory rind.  The patient does have some coagulopathy with diffuse oozing on the sides of the pelvis  A 19 French drain was brought in through the previous right-sided drain site and placed down into the pelvis.  We then closed the fascia with multiple interrupted figure-of-eight #1 Novafil sutures.  We also placed four retention sutures of #2 Ethilon.  These were tied over red rubber catheter bridges.  A VAC sponge was cut to fit in between the retention sutures.  This was secured with an occlusive drape and placed to suction.  I then revise the colostomy by placing multiple interrupted 3-0 Vicryl sutures to mature the colostomy.  I placed the sutures directly through the dermis and epidermis to try to secure the colostomy in place with no further mucocutaneous separation.   CT-guided aspiration/drainage of right abdominal fluid collection (8/11): 12 Fr drain placed in right abdominal fluid collection with return of dark bloody and foul-smelling fluid. Fluid sample sent for culture analysis. Drain attached to suction bulb drainage.   Blood Culture (7/31): Escherichia coli   Surgical Culture (8/11): ABUNDANT  KLEBSIELLA PNEUMONIAE, ABUNDANT BACTEROIDES, FRAGILIS, BETA LACTAMASE POSITIVE, FEW PSEUDOMONAS AERUGINOSA   Discharge Instructions: Discharge Instructions    Call MD for:  difficulty breathing, headache or visual disturbances   Complete by: As directed    Call MD for:  extreme fatigue   Complete by: As directed    Call MD for:  hives   Complete by: As directed    Call MD for:  persistant dizziness or light-headedness   Complete by: As directed    Call MD for:  persistant nausea and vomiting   Complete by: As directed    Call MD for:  redness, tenderness, or signs of infection (pain, swelling, redness, odor or green/yellow discharge around incision site)   Complete by: As directed    Call MD for:  severe uncontrolled pain   Complete by: As directed    Call MD for:  temperature >100.4   Complete by: As directed    Diet - low sodium heart healthy   Complete by: As directed    Discharge instructions   Complete by: As directed    Dear Natasha Barrera, It was a pleasure taking care of you. You were admitting for Crohn's and then had to have emergency surgery for a bowel perforation,were taken back to the OR, had an infected hematoma, had a blood infection, and had 2 drains placed. Due to the extent of your hospitalization you are going to inpatient rehab to build your strength back up. The drains will stay in place until you see Surgery in their office. You will need to continue taking your antibiotic tonight and for tomorrow.  Thank you very much   Discharge wound care:   Complete by: As directed    Wound 10/25/11 Blister (Serous filled) Buttocks Right blister 2950 days   Incision - 4 Ports Abdomen 1: Right;Upper 2: Right;Mid 3: Right;Lower 4: Left;Lower 2889 days  Incision - 2 Ports Abdomen 1: Right;Lateral 2: Right;Upper 353 days  Pressure Injury 10/29/19 Buttocks Right;Left Stage 1 -  Intact skin with non-blanchable redness of a localized area usually over a bony prominence. inner  buttocks L and R; reddened non-blanchable area approximately 1inch x 1 inch area 25 days  Incision (Closed) 11/04/19 Abdomen Other (Comment) 18 days  Pressure Injury 11/09/19 Buttocks Right Stage 2 -  Partial thickness loss of dermis presenting as a shallow open injury with a red, pink wound bed without slough. pink/[place] wound bed   Wound care  Every shift      Comments: Damp to dry dressing changes to midline wound. Change BID   Increase activity slowly   Complete by: As directed       Signed: Briant Cedar, MD 11/23/2019, 12:19 PM   Pager: [phone]

## 2019-11-23 NOTE — Progress Notes (Signed)
Subjective: Interviewed patient at bedside.   States she's doing well, slowly eating more solid foods. Continuing to work with physical therapy. Discussed with patient plan for CIR, currently waiting for bed.   Objective:  Vital signs in last 24 hours: Vitals:   11/22/19 1623 11/22/19 1935 11/23/19 0017 11/23/19 0450  BP: 105/66 90/60 93/63  (!) 94/59  Pulse: 89 94 83 92  Resp: 20 18 18    Temp: 98.7 F (37.1 C) 98.3 F (36.8 C) 98.2 F (36.8 C) 97.8 F (36.6 C)  TempSrc: Oral Oral Oral Oral  SpO2: 98% 98% 99% 97%  Weight:      Height:       Physical Exam Vitals and nursing note reviewed.  Constitutional:      General: She is not in acute distress.    Appearance: Normal appearance. She is not ill-appearing or toxic-appearing.  HENT:     Head: Normocephalic and atraumatic.  Cardiovascular:     Rate and Rhythm: Normal rate and regular rhythm.     Pulses: Normal pulses.          Radial pulses are 2+ on the right side and 2+ on the left side.       Dorsalis pedis pulses are 2+ on the right side and 2+ on the left side.     Heart sounds: Normal heart sounds, S1 normal and S2 normal. No murmur heard.   Pulmonary:     Effort: Pulmonary effort is normal. No respiratory distress.     Breath sounds: Normal breath sounds. No wheezing.  Abdominal:     General: There is no distension.     Palpations: Abdomen is soft. There is no mass.     Tenderness: There is no abdominal tenderness.  Musculoskeletal:     Right lower leg: No edema.     Left lower leg: No edema.  Neurological:     Mental Status: She is alert. Mental status is at baseline.  Psychiatric:        Mood and Affect: Mood normal.        Behavior: Behavior normal.        Thought Content: Thought content normal.      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 Pischke aa51yr old femaleadmitted with newdiagnosis of severeCrohn's disease, hospitalizationcomplicated by a bowel perforation on 7/24, s/purgent partial colectomy and colostomy, S/P exploratory lap and reattachment of ostomy 8/4.   Deconditioning: After multiple conversations and further consideration, Ms.Felch is agreeable to CIR. Currently no beds available. Has CIR bed available will discharge today. - Appreciate assistance from CJordanhematomas/p drain S/p drain placement on 11/11/19. Culture growing klebsiella pneumoniae, bacteroids, pseudomonas. Worsening purulent output from drains concerning for fistula. CT abdomen/pelvis reassuring with resolution of abscess and trace fluids. WBC today. Afebrile.  Currently on Ciprofloxacin. This is day 13 of treatment w/ antibiotics after 7 days of Zosyn and 6 days of Ciprofloxacin. Will discontinue after completing 14 days of treatment after discussion with surgery -Appreciate surgery recs -Trend CBC -Continue Ciprofloxacin 500 mg BID (Day 6)   Hypovolemic Hypotonic Hyponatremia Severe protein calorie malnutrition Recurrent hyponatremia due to poor solute intake. Na  130->135->133  - NS 100cc/hr - Trend BMP - Appreciate dietary assistance in proper nutrition   Crohn's Flare/Sigmoid Colon perforations/p colostomy formation: S/Pcolectomy on 7/24.S/P exploratory [place] morning8/5.GI on board but feels until surgical site and infections are resolved, not a candidate for biologics. -Continue advancing diet as able -Continue dressing changes on wound  -Continueon prednisone 20 mg daily -Continue to monitor for flare of diarrhea or hematochezia    Adjustment Disorder w/ Depressed Mood In good mood this am -Continue escitalopram 74m daily   DVT  prophx:lovenox Diet:Soft Bowel:N/A Code: Full   Prior to Admission Living Arrangement: Home Anticipated Discharge Location: CIR Barriers to Discharge: Bed availability Dispo: Anticipated discharge in approximately 1-3 day(s).   PBriant Cedar MD 11/23/2019, 6:18 AM Pager: [phone] 5pm on weekdays and 1pm on weekends: On Call pager [phone]

## 2019-11-23 NOTE — Progress Notes (Signed)
Occupational Therapy Treatment Patient Details Name: Natasha Barrera MRN: [id number] DOB: May 22, 1965 Today's Date: 11/23/2019    History of present illness Natasha Barrera is a 54 year old female with PMH significant for HTN and diverticulitis (s/p sigmoid colectomy 2015) who originally presented with complaints of LE edema with later reports of abdominal pain and decreased appetite.  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 s/p exp laparatomy for dehiscence, colostomy revision, and wound vac placement 8/5.   OT comments  Patient continues to make steady progress towards goals in skilled OT session. Patient's session encompassed co-treat with PT in order to progress in transfers and ambulation. Pt motivated to engage in therapy today, however requires verbal cues for all tasks as well as max encouragement as pt feels she is not progressing. Pt able to complete 4 sit to stand transfers in session, as well as ambulate with therapists providing mod-max assist of 2 to complete with RW. Discharge remains appropriate, will continue to follow acutely.    Follow Up Recommendations  CIR;Supervision/Assistance - 24 hour    Equipment Recommendations  Other (comment) (Defer to next venue)    Recommendations for Other Services      Precautions / Restrictions Precautions Precautions: Fall;Other (comment) Precaution Comments: Jp drain x2 Right side/ ostomy L side / abdomen binder when up Required Braces or Orthoses: Other Brace Other Brace: abdominal binder when OOB Restrictions Weight Bearing Restrictions: No       Mobility Bed Mobility Overal bed mobility: Needs Assistance Bed Mobility: Supine to Sit;Rolling Rolling: Supervision   Supine to sit: Min assist;HOB elevated         Transfers Overall transfer level: Needs assistance Equipment used: Rolling walker (2 wheeled) Transfers: Sit to/from Stand Sit to Stand: Mod assist;Max assist;+2 physical assistance;+2 safety/equipment         General transfer comment: pt max A of to complete sit<>stand from lower surfaces, but mod of 2 to complete from elevated surfaces, increased assistance provided at rear in order to promote full standing and initiating tucking of hips    Balance Overall balance assessment: Needs assistance Sitting-balance support: No upper extremity supported;Feet supported Sitting balance-Leahy Scale: Fair     Standing balance support: Bilateral upper extremity supported Standing balance-Leahy Scale: Poor                             ADL either performed or assessed with clinical judgement   ADL Overall ADL's : Needs assistance/impaired                         Toilet Transfer: Maximal assistance;+2 for physical assistance;+2 for safety/equipment;RW Toilet Transfer Details (indicate cue type and reason): simulated sit<>Stand and able to ambulate x2 bringing chair behind with max A of 2 Toileting- Clothing Manipulation and Hygiene: Total assistance Toileting - Clothing Manipulation Details (indicate cue type and reason): colostomy and changing of brief     Functional mobility during ADLs: Moderate assistance;Cueing for safety;Cueing for sequencing General ADL Comments: pt completed transfer from supine to sit then sit<>Stand x4 and ambulating 5-7 x2 in session     Vision       Perception     Praxis      Cognition Arousal/Alertness: Awake/alert Behavior  During Therapy: WFL for tasks assessed/performed Overall Cognitive Status: Impaired/Different from baseline Area of Impairment: Memory;Problem solving                     Memory: Decreased short-term memory Following Commands: Follows one step commands with increased time     Problem  Solving: Requires verbal cues General Comments: Pt requires verbal cues for all care currently and minimal increased time, but extremely participatory and motivated        Exercises     Shoulder Instructions       General Comments      Pertinent Vitals/ Pain       Pain Assessment: Faces Faces Pain Scale: Hurts a little bit Pain Location: Generalized Pain Descriptors / Indicators: Grimacing Pain Intervention(s): Monitored during session;Repositioned  Home Living                                          Prior Functioning/Environment              Frequency  Min 2X/week        Progress Toward Goals  OT Goals(current goals can now be found in the care plan section)  Progress towards OT goals: Progressing toward goals  Acute Rehab OT Goals Patient Stated Goal: home OT Goal Formulation: With patient Time For Goal Achievement: 12/02/19 Potential to Achieve Goals: Good  Plan Discharge plan remains appropriate    Co-evaluation    PT/OT/SLP Co-Evaluation/Treatment: Yes Reason for Co-Treatment: Complexity of the patient's impairments (multi-system involvement);For patient/therapist safety;To address functional/ADL transfers PT goals addressed during session: Mobility/safety with mobility;Strengthening/ROM OT goals addressed during session: ADL's and self-care;Strengthening/ROM      AM-PAC OT "6 Clicks" Daily Activity     Outcome Measure   Help from another person eating meals?: A Little Help from another person taking care of personal grooming?: A Lot Help from another person toileting, which includes using toliet, bedpan, or urinal?: A Lot Help from another person bathing (including washing, rinsing, drying)?: A Lot Help from another person to put on and taking off regular upper body clothing?: A Lot Help from another person to put on and taking off regular lower body clothing?: A Lot 6 Click Score: 13    End of Session Equipment Utilized  During Treatment: Gait belt  OT Visit Diagnosis: Unsteadiness on feet (R26.81);Muscle weakness (generalized) (M62.81);Pain   Activity Tolerance Patient tolerated treatment well   Patient Left in chair;with call bell/phone within reach   Nurse Communication Mobility status;Precautions        Time: 1015-1040 OT Time Calculation (min): 25 min  Charges: OT General Charges $OT Visit: 1 Visit OT Treatments $Self Care/Home Management : 8-22 mins  Corinne Ports [place]. [place], [facility] [place] 11/23/2019, 1:21 PM

## 2019-11-23 NOTE — Progress Notes (Signed)
Patient arrived on unit via bed transport. Denies pain or discomfort. Educated on call bell and when to call for assistance. Bed low with call bell with in reach. Husband at bedside.

## 2019-11-24 ENCOUNTER — Inpatient Hospital Stay (HOSPITAL_COMMUNITY): Payer: 59 | Admitting: Occupational Therapy

## 2019-11-24 ENCOUNTER — Inpatient Hospital Stay (HOSPITAL_COMMUNITY): Payer: 59 | Admitting: Physical Therapy

## 2019-11-24 ENCOUNTER — Other Ambulatory Visit: Payer: Self-pay

## 2019-11-24 DIAGNOSIS — R5381 Other malaise: Principal | ICD-10-CM

## 2019-11-24 DIAGNOSIS — E876 Hypokalemia: Secondary | ICD-10-CM

## 2019-11-24 DIAGNOSIS — I9589 Other hypotension: Secondary | ICD-10-CM

## 2019-11-24 DIAGNOSIS — E8809 Other disorders of plasma-protein metabolism, not elsewhere classified: Secondary | ICD-10-CM

## 2019-11-24 DIAGNOSIS — E861 Hypovolemia: Secondary | ICD-10-CM

## 2019-11-24 LAB — COMPREHENSIVE METABOLIC PANEL
ALT: 15 U/L (ref 0–44)
AST: 19 U/L (ref 15–41)
Albumin: 2.1 g/dL — ABNORMAL LOW (ref 3.5–5.0)
Alkaline Phosphatase: 104 U/L (ref 38–126)
Anion gap: 10 (ref 5–15)
BUN: 5 mg/dL — ABNORMAL LOW (ref 6–20)
CO2: 24 mmol/L (ref 22–32)
Calcium: 8.7 mg/dL — ABNORMAL LOW (ref 8.9–10.3)
Chloride: 97 mmol/L — ABNORMAL LOW (ref 98–111)
Creatinine, Ser: 0.37 mg/dL — ABNORMAL LOW (ref 0.44–1.00)
GFR calc Af Amer: >60 mL/min (ref >60)
GFR calc non Af Amer: >60 mL/min (ref >60)
Glucose, Bld: 113 mg/dL — ABNORMAL HIGH (ref 70–99)
Potassium: 4.2 mmol/L (ref 3.5–5.1)
Sodium: 131 mmol/L — ABNORMAL LOW (ref 135–145)
Total Bilirubin: 0.5 mg/dL (ref 0.3–1.2)
Total Protein: 5.8 g/dL — ABNORMAL LOW (ref 6.5–8.1)

## 2019-11-24 LAB — CBC WITH DIFFERENTIAL/PLATELET
Abs Immature Granulocytes: 0.15 10*3/uL — ABNORMAL HIGH (ref 0.00–0.07)
Basophils Absolute: 0.1 10*3/uL (ref 0.0–0.1)
Basophils Relative: 0 %
Eosinophils Absolute: 0 10*3/uL (ref 0.0–0.5)
Eosinophils Relative: 0 %
HCT: 32.2 % — ABNORMAL LOW (ref 36.0–46.0)
Hemoglobin: 10.2 g/dL — ABNORMAL LOW (ref 12.0–15.0)
Immature Granulocytes: 1 %
Lymphocytes Relative: 5 %
Lymphs Abs: 0.6 10*3/uL — ABNORMAL LOW (ref 0.7–4.0)
MCH: 32.4 pg (ref 26.0–34.0)
MCHC: 31.7 g/dL (ref 30.0–36.0)
MCV: 102.2 fL — ABNORMAL HIGH (ref 80.0–100.0)
Monocytes Absolute: 0.2 10*3/uL (ref 0.1–1.0)
Monocytes Relative: 2 %
Neutro Abs: 10.3 10*3/uL — ABNORMAL HIGH (ref 1.7–7.7)
Neutrophils Relative %: 92 %
Platelets: 429 10*3/uL — ABNORMAL HIGH (ref 150–400)
RBC: 3.15 MIL/uL — ABNORMAL LOW (ref 3.87–5.11)
RDW: 19.2 % — ABNORMAL HIGH (ref 11.5–15.5)
WBC: 11.3 10*3/uL — ABNORMAL HIGH (ref 4.0–10.5)
nRBC: 0 % (ref 0.0–0.2)

## 2019-11-24 MED ORDER — MIDODRINE HCL 5 MG PO TABS
5.0000 mg | ORAL_TABLET | Freq: Two times a day (BID) | ORAL | Status: DC
  Administered 2019-11-25 – 2019-12-01 (×14): 5 mg via ORAL
  Filled 2019-11-24 (×14): qty 1

## 2019-11-24 NOTE — Progress Notes (Signed)
Occupational Therapy Session Note  Patient Details  Name: EARNSTINE MEINDERS MRN: [id number] Date of Birth: 1965-06-03  Today's Date: 11/24/2019 OT Individual Time: 1330-1445 OT Individual Time Calculation (min): 75 min    Skilled Therapeutic Interventions/Progress Updates:    patient seated in w/c, she states that she is feeling like she sat up too much today and her bottom is sore.   Reviewed need for rest breaks and strategies for upcoming days.   Completed weight shift activity w/c level and attempts to clear bottom with attempts to stand at bed rail (she was able to partially clear bottom in this position with mod A)  Completed hair washing with shampoo tray - max A to wash and rinse hair, she was able to brush independently and use hairdryer to dry 50%.  SB transfer w/c to bed with min A and cues to clear buttocks.  Sitting to supine mod A.  She completed UB and LB exercises in supine position - fatigue noted with UB activities, poor tolerance for LB exercises due to core weakness, pain and fatigue.  Assisted into sidelying position with min A where she remained at close of session.  Bed alarm set and call bell in hand.    Therapy Documentation Precautions:  Precautions Precautions: Fall, Other (comment) Precaution Comments: Jp drain x2 Right side / ostomy L side / abdomen binder when up Required Braces or Orthoses: Other Brace Other Brace: abdominal binder when OOB Restrictions Weight Bearing Restrictions: No   Therapy/Group: Individual Therapy  Carlos Levering 11/24/2019, 3:41 PM

## 2019-11-24 NOTE — Evaluation (Addendum)
Physical Therapy Assessment and Plan  Patient Details  Name: Natasha Barrera MRN: [id number] Date of Birth: 1965-10-23  PT Diagnosis: Abnormal posture, Abnormality of gait, Coordination disorder, Difficulty walking and Muscle weakness Rehab Potential: Good ELOS: 2 weeks   Today's Date: 11/24/2019 PT Individual Time: 0803-0900 PT Individual Time Calculation (min): 57 min    Hospital Problem: Principal Problem:   Debility   Past Medical History:  Past Medical History:  Diagnosis Date  . Diverticul disease small and large intestine, no perforati or abscess   . Diverticulitis of Barrera   . Hypertension    Past Surgical History:  Past Surgical History:  Procedure Laterality Date  . APPLICATION OF WOUND VAC N/A 11/04/2019   Procedure: APPLICATION OF WOUND VAC;  Surgeon: Donnie Mesa, MD;  Location: [place];  Service: General;  Laterality: N/A;  . BIOPSY  10/21/2019   Procedure: BIOPSY;  Surgeon: Irene Shipper, MD;  Location: [facility] ENDOSCOPY;  Service: Endoscopy;;  . Barrera SURGERY  12/25/2011  . COLONOSCOPY    . COLONOSCOPY WITH PROPOFOL N/A 10/21/2019   Procedure: COLONOSCOPY WITH PROPOFOL;  Surgeon: Irene Shipper, MD;  Location: [facility] ENDOSCOPY;  Service: Endoscopy;  Laterality: N/A;  . COLOSTOMY Left 10/24/2019   Procedure: COLOSTOMY;  Surgeon: Clovis Riley, MD;  Location: [place];  Service: General;  Laterality: Left;  . ESOPHAGOGASTRODUODENOSCOPY (EGD) WITH PROPOFOL N/A 10/21/2019   Procedure: ESOPHAGOGASTRODUODENOSCOPY (EGD) WITH PROPOFOL;  Surgeon: Irene Shipper, MD;  Location: [facility] ENDOSCOPY;  Service: Endoscopy;  Laterality: N/A;  with small bowel BX's  . [place]  2002  . LAPAROTOMY N/A 10/24/2019   Procedure: EXPLORATORY LAPAROTOMY;  Surgeon: Clovis Riley, MD;  Location: [place];  Service: General;  Laterality: N/A;  . LAPAROTOMY N/A 11/04/2019   Procedure: EXPLORATORY LAPAROTOMY FOR DEHISCENCE;  Surgeon: Donnie Mesa, MD;  Location: [place];  Service: General;  Laterality:  N/A;  . VENTRAL HERNIA REPAIR N/A 12/05/2018   Procedure: LAPAROSCOPIC VENTRAL HERNIA REPAIR WITH MESH;  Surgeon: Jovita Kussmaul, MD;  Location: [facility] OR;  Service: General;  Laterality: N/A;    Assessment & Plan Clinical Impression: Patient is a 54 y.o. year old female with history of hypertension, diverticulitis status post sigmoid colectomy 2013, ventral hernia repair, tobacco use.  Patient with recent admission 10/02/2019 to 10/04/2019 for complaints of abdominal discomfort diarrhea and weight loss found to be hypotensive with electrolyte derangements.  CT abdomen pelvis at that time revealed diffuse colitis involving the ascending, transverse and descending Barrera.  GI was consulted recommendations inpatient colostomy and stool studies.  Patient and her husband refused to stay for complete work-up and left AMA.  Since her discharge of 10/04/2019 continued experiencing worsening fatigue poor appetite loose stools as well as some swelling lower extremities.  Patient presented back 10/18/2019 with increasing abdominal pain.  Blood pressure 97/65 hemoglobin 10.4 platelet 593 sodium 123 chloride 86 potassium 3.5 total protein 4.4, total bilirubin 1.3.  CTA unremarkable.  GI services consulted Dr. Henrene Pastor underwent colonoscopy/upper GI endoscopy 10/21/2019 that showed the terminal ileum appeared normal.  There was scarring and mucosal bridging as well as scattered areas of ulceration throughout the entire Barrera.  There was evidence of prior sigmoid colectomy and a few diverticula.  EGD was unremarkable.  Findings favored Crohn's colitis.  On the afternoon of 10/23/2019 patient began to experience increasing upper abdominal pain ultimately underwent a CT revealing large amount of pneumoperitoneum as well as apparent thrombus in the infrarenal aorta with less than  50% luminal involvement with underlying calcified aortoiliac disease thrombus appears acute was not present on CT abdomen pelvis 10/02/2019.  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 Kae Heller.  Postoperative course complicated by hypotension with follow-up for critical care maintained on IV hydration as well as Levophed.  She did require IV heparin for thrombosis and she did develop subsequent large mesenteric hematoma 10/30/2019 and IV heparin discontinued.  On 11/04/2019 she had facial dehiscence with visible transverse Barrera underwent exploratory laparotomy colostomy revision, fascial closure, placement of 4 retention sutures and wound VAC along with right JP drain placement.  As of 11/11/2019 findings of right abdominal abscess status post IR drain per Dr.Yamagata As noted cultures Klebsiella and Pseudomonas and initially maintained on Zosyn switched to Cipro 11/18/2019.  Latest CT of the abdomen 11/21/2019 showed complete resolution of right mid abdominal abscess after pigtail drainage catheter placement.  No recurrence or residual fluid collection.  Patient undergoing twice daily wet-to-dry dressing changes.  She was cleared to begin subcutaneous Lovenox for DVT prophylaxis.  Patient with slow progressive gains acute blood loss anemia latest hemoglobin 8.4, sodium 135.  Therapy evaluations completed and patient was admitted for a comprehensive rehab program.  Patient transferred to CIR on 11/23/2019 .   Patient currently requires mod +1-2 assist with mobility secondary to muscle weakness, decreased cardiorespiratoy endurance and decreased standing balance, decreased postural control and decreased balance strategies.  Prior to hospitalization, patient was independent  with mobility and lived with Spouse (husband & 72 y/o son) in a House home.  Home access is  Level entry (at garage).  Patient will benefit from skilled PT intervention to maximize safe functional mobility, minimize fall risk and decrease caregiver burden for planned discharge home with 24 hour assist.  Anticipate patient will benefit from follow  up [facility] at discharge.  PT - End of Session Activity Tolerance: Tolerates 30+ min activity with multiple rests Endurance Deficit: Yes Endurance Deficit Description: 2/2 generalized deconditioning PT Assessment Rehab Potential (ACUTE/IP ONLY): Good PT Barriers to Discharge: Wound Care PT Barriers to Discharge Comments: new ostomy PT Patient demonstrates impairments in the following area(s): Balance;Pain;Safety;Endurance;Motor PT Transfers Functional Problem(s): Bed Mobility;Bed to Chair;Car;Furniture PT Locomotion Functional Problem(s): Stairs;Wheelchair Mobility;Ambulation PT Plan PT Intensity: Minimum of 1-2 x/day ,45 to 90 minutes PT Frequency: 5 out of 7 days PT Duration Estimated Length of Stay: 2 weeks PT Treatment/Interventions: Ambulation/gait training;Discharge planning;DME/adaptive equipment instruction;Functional mobility training;Pain management;Psychosocial support;Therapeutic Activities;UE/LE Strength taining/ROM;Wheelchair propulsion/positioning;UE/LE Coordination activities;Therapeutic Exercise;Stair training;Skin care/wound management;Patient/family education;Neuromuscular re-education;Disease management/prevention;Community reintegration;Balance/vestibular training PT Transfers Anticipated Outcome(s): min assist with LRAD PT Locomotion Anticipated Outcome(s): min assist household ambulation PT Recommendation Recommendations for Other Services: Therapeutic Recreation consult;Neuropsych consult Therapeutic Recreation Interventions: Stress management;Kitchen group Follow Up Recommendations: Home health PT;24 hour supervision/assistance Patient destination: Home Equipment Details: RW, transport w/c   PT Evaluation Precautions/Restrictions Precautions Precautions: Fall;Other (comment) Precaution Comments: Jp drain x2 Right side / ostomy L side / abdomen binder when up Required Braces or Orthoses: Other Brace Other Brace: abdominal binder when OOB Restrictions Weight  Bearing Restrictions: No  General Chart Reviewed: Yes Additional Pertinent History: HTN Response to Previous Treatment: Not applicable  Vital Signs Supine: HR = 95 bpm, SpO2 = 98% (room air), BP = 96/67 mmHg (LUE) Sitting EOB: 93/63 mmHg (LUE) with abdominal binder & thigh high teds donned, HR = 102 bpm Sitting in w/c after stand pivot: 103/65 mmHg (LUE), HR = 111 bpm  Pain 0/10 at rest,  2/10 with movement in stomach - tenderness - rest breaks provided PRN, denies asking for pain medication  Home Living/Prior Functioning Home Living Available Help at Discharge: Family;Available 24 hours/day (husband works from home) Type of Home: House Home Access: Level entry (at garage) Home Layout: One level  Lives With: Spouse (husband & 59 y/o son) Prior Function Level of Independence: Independent with basic ADLs;Independent with transfers;Independent with gait  Able to Take Stairs?: Yes Driving: Yes Vocation: Unemployed Leisure: Hobbies-yes (Comment) Comments: intermittently babysits 3 y/o grandson, walking, antiquing  Vision/Perception  Pt wears glasses for reading only. Pt denies changes in baseline vision.  Cognition Overall Cognitive Status: Within Functional Limits for tasks assessed Arousal/Alertness: Awake/alert Orientation Level: Oriented X4 Memory: Appears intact Awareness: Appears intact Problem Solving: Appears intact Safety/Judgment: Appears intact   Sensation Sensation Light Touch: Appears Intact (BLE) Proprioception: Appears Intact (BLE) Coordination Gross Motor Movements are Fluid and Coordinated: No (limited by significant BLE weakness)   Motor  Motor Motor: Abnormal postural alignment and control Motor - Skilled Clinical Observations: generalized deconditioning   Trunk/Postural Assessment  Cervical Assessment Cervical Assessment: Exceptions to [facility] (forward head) Thoracic Assessment Thoracic Assessment: Exceptions to [facility] (rounded shoulders) Postural  Control Postural Control: Deficits on evaluation Righting Reactions: delayed 2/2 generalized weakness Protective Responses: delayed 2/2 generalized weakness   Balance Balance Balance Assessed: Yes Static Sitting Balance Static Sitting - Balance Support: Bilateral upper extremity supported;Feet supported Static Sitting - Level of Assistance: 4: Min assist   Extremity Assessment  BUE ROM WFL, generalized weakness (MMT not formally completed) RLE Assessment Active Range of Motion (AROM) Comments: WFL General Strength Comments: 2/5 hip flexion in sitting, 3/5 knee extension LLE Assessment Active Range of Motion (AROM) Comments: WFL General Strength Comments: 2/5 hip flexion in sitting, 3/5 knee extension  Care Tool Care Tool Bed Mobility Roll left and right activity   Roll left and right assist level: Minimal Assistance - Patient > 75%    Sit to lying activity   Sit to lying assist level: Minimal Assistance - Patient > 75%    Lying to sitting edge of bed activity   Lying to sitting edge of bed assist level: Minimal Assistance - Patient > 75%     Care Tool Transfers Sit to stand transfer   Sit to stand assist level: 2 Helpers    Chair/bed transfer   Chair/bed transfer assist level: Maximal Assistance - Patient 25 - 49%     Psychologist, counselling transfer activity did not occur: Safety/medical concerns        Care Tool Locomotion Ambulation Ambulation activity did not occur: Safety/medical concerns        Walk 10 feet activity Walk 10 feet activity did not occur: Safety/medical concerns       Walk 50 feet with 2 turns activity Walk 50 feet with 2 turns activity did not occur: Safety/medical concerns      Walk 150 feet activity Walk 150 feet activity did not occur: Safety/medical concerns      Walk 10 feet on uneven surfaces activity Walk 10 feet on uneven surfaces activity did not occur: Safety/medical concerns      Stairs Stair activity did  not occur: Safety/medical concerns        Walk up/down 1 step activity Walk up/down 1 step or curb (drop down) activity did not occur: Safety/medical concerns     Walk up/down 4 steps activity did not occuR: Safety/medical concerns  Walk up/down 4 steps activity      Walk up/down 12 steps activity Walk up/down 12 steps activity did not occur: Safety/medical concerns      Pick up small objects from floor Pick up small object from the floor (from standing position) activity did not occur: Safety/medical concerns      Wheelchair Will patient use wheelchair at discharge?:  (transport w/c) Type of Wheelchair: Manual   Wheelchair assist level: Supervision/Verbal cueing Max wheelchair distance: 150 ft  Wheel 50 feet with 2 turns activity   Assist Level: Supervision/Verbal cueing  Wheel 150 feet activity   Assist Level: Supervision/Verbal cueing    Refer to Care Plan for Long Term Goals  SHORT TERM GOAL WEEK 1 PT Short Term Goal 1 (Week 1): Pt will complete bed<>w/c with mod assist +1 & LRAD. PT Short Term Goal 2 (Week 1): Pt will ambulate 25 ft with mod assist & LRAD. PT Short Term Goal 3 (Week 1): Pt will complete sit<>stand consistently, from all seat heights, with mod assist +1 & LRAD.  Recommendations for other services: Neuropsych and Surveyor, mining group and Stress management  Skilled Therapeutic Intervention Pt received in bed & agreeable to tx. Educated pt on ELOS, daily therapy schedule, weekly team meetings, & other CIR information. PT provides pt with thigh high teds & dons them total assist, assists pt with donning abdominal binder while sitting EOB. Pt initiates supine>sit with PT educating her on log rolling to protect stomach but pt reporting this does not help alleviate pain with movement. Pt completes sit>stand from elevated EOB with mod assist +1, sit>stand from lower w/c seat with mod assist +2, mod assist stand pivot with RW, max assist stand>sit 2/2  poor eccentric control & decreased strength. Provided pt with 16x16 w/c for improved positioning to promote OOB tolerance. Pt propels w/c with BUE & supervision level. At end of session pt left in w/c with chair alarm donned, call bell & all needs in reach.   Addendum: pt does c/o dizziness with initial supine>sit change in position with rest break provided & pt reporting it ends after brief rest.   Mobility Bed Mobility Bed Mobility: Rolling Left;Right Sidelying to Sit;Supine to Sit;Sit to Supine Rolling Left: Minimal Assistance - Patient > 75% Right Sidelying to Sit: Minimal Assistance - Patient > 75% Supine to Sit: Minimal Assistance - Patient > 75% Sit to Supine: Minimal Assistance - Patient > 75% Transfers Transfers: Sit to Stand;Stand to Sit;Stand Pivot Transfers Sit to Stand:  (mod +1-2 helpers depending on seat height) Stand to Sit: Maximal Assistance - Patient 25-49% (BLE weakness, poor eccentric lowering) Stand Pivot Transfers: Moderate Assistance - Patient 50 - 74% Stand Pivot Transfer Details: Verbal cues for sequencing;Verbal cues for precautions/safety Transfer (Assistive device): Rolling walker Transfer via Lift Equipment: Stedy (pt also completes stedy transfer wtih mod assist +2 for sit>stand)  Locomotion  Gait Ambulation: No Stairs / Additional Locomotion Stairs: No Wheelchair Mobility Wheelchair Mobility: Yes Wheelchair Assistance: Chartered loss adjuster: Both upper extremities Wheelchair Parts Management: Needs assistance Distance: 150 ft   Discharge Criteria: Patient will be discharged from PT if patient refuses treatment 3 consecutive times without medical reason, if treatment goals not met, if there is a change in medical status, if patient makes no progress towards goals or if patient is discharged from hospital.  The above assessment, treatment plan, treatment alternatives and goals were discussed and mutually agreed upon: by  patient  [name] 11/24/2019, 11:22  AM

## 2019-11-24 NOTE — Patient Care Conference (Signed)
Inpatient RehabilitationTeam Conference and Plan of Care Update Date: 11/24/2019   Time: 10:38 AM    Patient Name: Natasha Barrera      Medical Record Number: [id number]  Date of Birth: 1965/12/12 Sex: Female         Room/Bed: 4W05C/4W05C-01 Payor Info: Payor: Holland Falling / Plan: [employer] NAP / Product Type: *No Product type* /    Admit Date/Time:  11/23/2019  5:12 PM  Primary Diagnosis:  [place] Hospital Problems: Principal Problem:   Debility    Expected Discharge Date: Expected Discharge Date:  (10-14 days)  Team Members Present: Physician leading conference: Dr. Alger Simons Care Coodinator Present: Loralee Pacas, LCSWA;Sherae Santino Creig Hines, RN, BSN, [place] Nurse Present: Toy Cookey, LPN PT Present: Lavone Nian, PT OT Present: Elisabeth Most, OT SLP Present: Weston Anna, SLP PPS Coordinator present : Ileana Ladd, PT     Current Status/Progress Goal Weekly Team Focus  Bowel/Bladder   Continent/incontinent of bladder; patient with colostomy, last emptied 8/24  Become fully continent of bladder; consistent output with colostomy  Assess every shift and as needed   Swallow/Nutrition/ Hydration             ADL's   Eval pending         Mobility   min assist bed mobility, mod assist +1-2 transfers, significant weakness, generalized deconditioning  min assist overall, household ambulation  transfers, bed mobility, gait as able, strengthening, endurance, balance, pt education   Communication             Safety/Cognition/ Behavioral Observations            Pain   No pain during shift  Pain <3  Assess every shift and as needed   Skin   Bruises to arms, colostomy, JP drains x2, and wound to sacral/buttocks area  Prevent further breakdown to sacral/buttocks area  Assess every shift and as needed     Discharge Planning:   Pending  Team Discussion: 2-JP drains with Min/Mod drainage. Patient has ostomy and will need teaching. Mod/max assist of 2 for transfers. Ted hose  and Ab binder added for blood pressure control. Patient is not appropriate for a shower. Patient will discharge with JP drains.   Patient on target to meet rehab goals: Yes, therapy goals still pending  *See Care Plan and progress notes for long and short-term goals.   Revisions to Treatment Plan:  Not at this time  Teaching Needs: Begin family education   Current Barriers to Discharge: Wound care and pain control.  Possible Resolutions to Barriers: Begin wound care teaching with patient and family, continue current pain medication regimen, and report to MD if not controlling patient's pain level.     Medical Summary Current Status: debility after diverticulitis, ex-lap, ongoing wound issues. pain mgt, cipro for abscess. reactive depression  Barriers to Discharge: Medical stability   Possible Resolutions to [employer] Focus: ongoing medical mgt, wound care, pain control   Continued Need for Acute Rehabilitation Level of Care: The patient requires daily medical management by a physician with specialized training in physical medicine and rehabilitation for the following reasons: Direction of a multidisciplinary physical rehabilitation program to maximize functional independence : Yes Medical management of patient stability for increased activity during participation in an intensive rehabilitation regime.: Yes Analysis of laboratory values and/or radiology reports with any subsequent need for medication adjustment and/or medical intervention. : Yes   I attest that I was present, lead the team conference, and concur with the assessment and  plan of the team.   Cristi Loron 11/24/2019, 3:18 PM

## 2019-11-24 NOTE — Progress Notes (Signed)
[facility] PHYSICAL MEDICINE & REHABILITATION PROGRESS NOTE   Subjective/Complaints: Pt still feeling weak when up but working through fatigue. bp's still low today. Belly sore after working with OT doing B&D.   ROS: Patient denies fever, rash, sore throat, blurred vision,   vomiting, diarrhea, cough, shortness of breath or chest pain, headache, or mood change.    Objective:   No results found. Recent Labs    11/23/19 0712 11/24/19 0933  WBC 8.0 11.3*  HGB 8.6* 10.2*  HCT 27.5* 32.2*  PLT 414* 429*   Recent Labs    11/23/19 0712 11/24/19 0933  NA 134* 131*  K 4.3 4.2  CL 102 97*  CO2 25 24  GLUCOSE 89 113*  BUN 5* 5*  CREATININE 0.31* 0.37*  CALCIUM 8.3* 8.7*    Intake/Output Summary (Last 24 hours) at 11/24/2019 1125 Last data filed at 11/24/2019 0700 Gross per 24 hour  Intake 360 ml  Output 412.5 ml  Net -52.5 ml     Physical Exam: Vital Signs Blood pressure 96/67, pulse 95, temperature 97.8 F (36.6 C), resp. rate 16, height 5' 1"  (1.549 m), weight 48 kg, last menstrual period 12/15/2014, SpO2 98 %.  General: Alert and oriented x 3, No apparent distress. Frail appearing, cachectic HEENT: Head is normocephalic, atraumatic, PERRLA, EOMI, sclera anicteric, oral mucosa pink and moist, dentition intact, ext ear canals clear,  Neck: Supple without JVD or lymphadenopathy Heart: Reg rate and rhythm. No murmurs rubs or gallops Chest: CTA bilaterally without wheezes, rales, or rhonchi; no distress Abdomen: Soft,  tender, non-distended, bowel sounds positive.  Extremities: No clubbing, cyanosis, or edema. Pulses are 2+, ostomy sealed with liquid stool present Skin: abdominal wound with granulation and retention sutures. JP drains with scant tan-colored, cloudy drainage.  Neuro: Pt is cognitively appropriate with normal insight, memory, and awareness. Cranial nerves 2-12 are intact. Sensory exam is normal. Reflexes are 2+ in all 4's. Fine motor coordination is intact. No  tremors. Motor function is grossly 4-/5 UE prox to 4/5 disally. LE: 2/5 HF, 3/5 KE, 4-/5 ADF/PF.  Musculoskeletal: Full ROM, No pain with AROM or PROM in the neck, trunk, or extremities. Posture appropriate Psych: Pt's affect is appropriate. Pt is cooperative      Assessment/Plan: 1. Functional deficits secondary to debility which require 3+ hours per day of interdisciplinary therapy in a comprehensive inpatient rehab setting.  Physiatrist is providing close team supervision and 24 hour management of active medical problems listed below.  Physiatrist and rehab team continue to assess barriers to discharge/monitor patient progress toward functional and medical goals  Care Tool:  Bathing              Bathing assist       Upper Body Dressing/Undressing Upper body dressing        Upper body assist      Lower Body Dressing/Undressing Lower body dressing            Lower body assist       Toileting Toileting    Toileting assist       Transfers Chair/bed transfer  Transfers assist     Chair/bed transfer assist level: Maximal Assistance - Patient 25 - 49%     Locomotion Ambulation   Ambulation assist   Ambulation activity did not occur: Safety/medical concerns          Walk 10 feet activity   Assist  Walk 10 feet activity did not occur: Safety/medical concerns  Walk 50 feet activity   Assist Walk 50 feet with 2 turns activity did not occur: Safety/medical concerns         Walk 150 feet activity   Assist Walk 150 feet activity did not occur: Safety/medical concerns         Walk 10 feet on uneven surface  activity   Assist Walk 10 feet on uneven surfaces activity did not occur: Safety/medical concerns         Wheelchair     Assist Will patient use wheelchair at discharge?:  (transport w/c) Type of Wheelchair: Manual    Wheelchair assist level: Supervision/Verbal cueing Max wheelchair distance: 150 ft     Wheelchair 50 feet with 2 turns activity    Assist        Assist Level: Supervision/Verbal cueing   Wheelchair 150 feet activity     Assist      Assist Level: Supervision/Verbal cueing   Blood pressure 96/67, pulse 95, temperature 97.8 F (36.6 C), resp. rate 16, height 5' 1"  (1.549 m), weight 48 kg, last menstrual period 12/15/2014, SpO2 98 %.  Medical Problem List and Plan: 1.  Debility secondary to Crohn's disease/diverticulitis status post exploratory laparotomy with Hartman's procedure 09/19/5091 complicated by fascial dehiscence mesenteric hematoma pelvic abscess and mucocutaneous separation of colostomy status post exploratory laparotomy colostomy revision drain placement and wound VAC 05/08/7122 further complicated by development of intra-abdominal fluid collection status post right lower quadrant drain placement 11/11/2019 per interventional radiology Dr. Kathlene Cote.  Continue prednisone 20 mg daily for Crohn's disease             -patient may not yet shower d/t abdominal wounds              -ELOS/Goals: min A in 10-14 days. 2.  Antithrombotics: -DVT/anticoagulation: Lovenox             -antiplatelet therapy: N/A 3. Pain Management: Neurontin 200 mg twice daily, Lidoderm patch as directed, Robaxin 1000 mg every 8 hours, tramadol as needed 4. Mood: Lexapro 10 mg daily, melatonin 3 mg nightly as needed             -antipsychotic agents: N/A  -will ask neuropsych to see to help with reactive depression/coping. She feels that lexapro has helped her. 5. Neuropsych: This patient is capable of making decisions on her own behalf. 6. Skin/Wound Care: continue local care to abdominal wounds  -JP's with decreasing tannish drainage. (only 5-10 cc over 24 hours) 7. Fluids/Electrolytes/Nutrition: encourage po  -I personally reviewed the patient's labs today.    -protein supp for low albumin  8.  ID/intra-abdominal abscess.  Currently maintained on Cipro 500 mg twice  daily  -wbc's up to 11k today--afebrile currently  -continue to monitor 9.  Acute blood loss anemia.  Hgb 8.6 today. Follow-up CBC Thursday 10.  Hypotension.     -  Continue TED stockings  -abdominal binder doesn't do much given that it can't be wrapped tightly around drains/abd wound. Can wear if it helps with comfort.  - Daily orthostatic vital signs ordered.   - Tramadol after therapy so it does not drop her BP as much   -8/24: midodrine trial when up with therapy 72m bid 11. Hypokalemia: 4.3  8/24    - supplement decreased to 276m daily   -f/u later this week     LOS: 1 days A FACE TO FAVictoria/24/2021, 11:25 AM

## 2019-11-24 NOTE — Progress Notes (Signed)
Nutrition Follow-up  DOCUMENTATION CODES:   Severe malnutrition in context of chronic illness  INTERVENTION:   - Continue Ensure Enlive po QID, each supplement provides 350 kcal and 20 grams of protein  - Continue ProSource 30 ml po once daily, each supplement provides 100 kcal and 15 grams of protein  - Provided diet education and handout to pt  - Continue MVI with minerals daily  NUTRITION DIAGNOSIS:   Severe Malnutrition related to chronic illness (Crohn's disease) as evidenced by moderate fat depletion, severe muscle depletion, percent weight loss (17.4% weight loss in 2 months).  GOAL:   Patient will meet greater than or equal to 90% of their needs  MONITOR:   PO intake, Supplement acceptance, Labs, Weight trends, Skin, I & O's  REASON FOR ASSESSMENT:   Malnutrition Screening Tool    ASSESSMENT:   54 year old female with PMH of HTN, diverticulitis s/p sigmoid colectomy 2013, ventral hernia repair, tobacco use. Pt with recent admission 10/02/19 to 10/04/19 for complaints of abdominal discomfort, diarrhea, and weight loss. CT A/P at that time revealed diffuse colitis. Pt presented back 10/18/19 with increasing abdominal pain. Pt underwent colonoscopy/upper GI endoscopy 10/21/19 that showed the terminal ileum appeared normal. There was scarring and mucosal bridging as well as scattered areas of ulceration throughout the entire colon. Findings favored Crohn's colitis. 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 On 11/04/19 pt underwent ex-lap with colostomy revision, fascial closure, placement of 4 retention sutures and wound VAC along with right JP  drain placement. Admitted to CIR on 8/23.   Pt currently on a soft diet with variable PO intake.  Spoke with pt at bedside. Pt reports UBW as 125 lbs and knows she has lost weight. She was surprised to find out her weight this morning was 105 lbs. Pt very motivated to gain weight back as she wants to have more energy and feel stronger. Discussed importance of adequate PO intake and a focus on protein-rich foods.  Pt requesting diet education and handout regarding foods to avoid. Handout and diet education provided.  Pt states that she was able to eat 100% of a ham sandwich for lunch and was very proud of this. Pt has ordered an egg salad sandwich to try later today.  Pt drinking 2-3 Ensure Enlive supplements daily. RD encouraged pt to continue this along with ProSource with pt reports she is taking as ordered.  Reviewed weight history in chart. Pt with a 10.1 kg weight loss since 09/20/19. This is a 17.4% weight loss in 2 months which is severe and significant for timeframe. Pt meets criteria for malnutrition.  Meal Completion: 25-100%  Medications reviewed and include: ProSource Plus daily, Ensure Enlive QID, MVI with minerals, protonix, Klor-con, prednisone, vitamin A  Labs reviewed: sodium 131  RLQ JP drain: 7.5 ml x 12 hours Right abdominal JP drain: 5 ml x 12 hours Colostomy: 400 ml x 24 hours  NUTRITION - FOCUSED PHYSICAL EXAM:    Most Recent Value  Orbital Region Moderate depletion  Upper Arm Region Mild depletion  Thoracic and Lumbar Region Moderate depletion  Buccal Region Moderate depletion  Temple Region Moderate depletion  Clavicle Bone Region Severe depletion  Clavicle and Acromion Bone Region  Severe depletion  Scapular Bone Region Moderate depletion  Dorsal Hand Moderate depletion  Patellar Region Moderate depletion  Anterior Thigh Region Severe depletion  Posterior Calf Region Moderate depletion  Edema (RD Assessment) None  Hair Reviewed  Eyes Reviewed  Mouth  Reviewed  Skin Reviewed  Nails Reviewed       Diet Order:   Diet Order            DIET SOFT Room service appropriate? Yes; Fluid consistency: Thin  Diet effective now                 EDUCATION NEEDS:   Education needs have been addressed  Skin:  Skin Assessment: Skin Integrity Issues: Stage I: left buttocks Stage II: right buttocks Incisions: abdomen  Last BM:  400 ml x 24 hours via colostomy  Height:   Ht Readings from Last 1 Encounters:  11/24/19 5' 1"  (1.549 m)    Weight:   Wt Readings from Last 1 Encounters:  11/24/19 48 kg    BMI:  Body mass index is 19.99 kg/m.  Estimated Nutritional Needs:   Kcal:  1800-2000  Protein:  90-105 grams  Fluid:  >/= 1.8 L    Gaynell Face, MS, RD, LDN Inpatient Clinical Dietitian Please see [facility] for contact information.

## 2019-11-24 NOTE — Evaluation (Signed)
Occupational Therapy Assessment and Plan  Patient Details  Name: Natasha Barrera MRN: [id number] Date of Birth: 03-16-1966  OT Diagnosis: acute pain and muscle weakness (generalized) Rehab Potential: Rehab Potential (ACUTE ONLY): Excellent ELOS: 12-14 days   Today's Date: 11/24/2019 OT Individual Time: 1062-6948 OT Individual Time Calculation (min): 57 min     Hospital Problem: Principal Problem:   Debility   Past Medical History:  Past Medical History:  Diagnosis Date  . Diverticul disease small and large intestine, no perforati or abscess   . Diverticulitis of colon   . Hypertension    Past Surgical History:  Past Surgical History:  Procedure Laterality Date  . APPLICATION OF WOUND VAC N/A 11/04/2019   Procedure: APPLICATION OF WOUND VAC;  Surgeon: Donnie Mesa, MD;  Location: [facility];  Service: General;  Laterality: N/A;  . BIOPSY  10/21/2019   Procedure: BIOPSY;  Surgeon: Irene Shipper, MD;  Location: [facility] ENDOSCOPY;  Service: Endoscopy;;  . COLON SURGERY  12/25/2011  . COLONOSCOPY    . COLONOSCOPY WITH PROPOFOL N/A 10/21/2019   Procedure: COLONOSCOPY WITH PROPOFOL;  Surgeon: Irene Shipper, MD;  Location: [facility] ENDOSCOPY;  Service: Endoscopy;  Laterality: N/A;  . COLOSTOMY Left 10/24/2019   Procedure: COLOSTOMY;  Surgeon: Clovis Riley, MD;  Location: [facility];  Service: General;  Laterality: Left;  . ESOPHAGOGASTRODUODENOSCOPY (EGD) WITH PROPOFOL N/A 10/21/2019   Procedure: ESOPHAGOGASTRODUODENOSCOPY (EGD) WITH PROPOFOL;  Surgeon: Irene Shipper, MD;  Location: [facility] ENDOSCOPY;  Service: Endoscopy;  Laterality: N/A;  with small bowel BX's  . [place]  2002  . LAPAROTOMY N/A 10/24/2019   Procedure: EXPLORATORY LAPAROTOMY;  Surgeon: Clovis Riley, MD;  Location: [place];  Service: General;  Laterality: N/A;  . LAPAROTOMY N/A 11/04/2019   Procedure: EXPLORATORY LAPAROTOMY FOR DEHISCENCE;  Surgeon: Donnie Mesa, MD;  Location: [place];  Service: General;  Laterality: N/A;  .  VENTRAL HERNIA REPAIR N/A 12/05/2018   Procedure: LAPAROSCOPIC VENTRAL HERNIA REPAIR WITH MESH;  Surgeon: Jovita Kussmaul, MD;  Location: [facility];  Service: General;  Laterality: N/A;    Assessment & Plan Clinical Impression: Patient is a 54 y.o. year old female with recent admission to the hospital on 10/18/2019 with increasing abdominal pain.  Blood pressure 97/65 hemoglobin 10.4 platelet 593 sodium 123 chloride 86 potassium 3.5 total protein 4.4, total bilirubin 1.3.  CTA unremarkable.  GI services consulted Dr. Henrene Pastor underwent colonoscopy/upper GI endoscopy 10/21/2019 that showed the terminal ileum appeared normal.  There was scarring and mucosal bridging as well as scattered areas of ulceration throughout the entire colon.  There was evidence of prior sigmoid colectomy and a few diverticula.  EGD was unremarkable.  Findings favored Crohn's colitis.  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 Kae Heller.  Postoperative course complicated by hypotension with follow-up for critical care maintained on IV hydration as well as Levophed.  She did require IV heparin for thrombosis and she did develop subsequent large mesenteric hematoma 10/30/2019 and IV heparin discontinued.  On 11/04/2019 she had facial dehiscence with visible transverse colon underwent exploratory laparotomy colostomy revision, fascial closure, placement of 4 retention sutures  and wound VAC along with right JP drain placement.  As of 11/11/2019 findings of right abdominal abscess status post IR drain per Dr.Yamagata As noted  cultures Klebsiella and Pseudomonas and initially maintained on Zosyn switched to Cipro 11/18/2019.  Latest CT of the abdomen 11/21/2019 showed complete resolution of right mid abdominal abscess after pigtail drainage catheter placement.  Patient transferred to CIR on 11/23/2019 .    Patient currently requires total with basic self-care skills secondary to muscle weakness and decreased standing balance, decreased postural control and decreased balance strategies.  Prior to hospitalization, patient could complete ADLs with independent .  Patient will benefit from skilled intervention to decrease level of assist with basic self-care skills prior to discharge home with care partner.  Anticipate patient will require 24 hour supervision and follow up home health.  OT - End of Session Activity Tolerance: Improving Endurance Deficit: Yes OT Assessment Rehab Potential (ACUTE ONLY): Excellent OT Patient demonstrates impairments in the following area(s): Balance;Endurance;Pain OT Basic ADL's Functional Problem(s): Grooming;Bathing;Dressing;Toileting OT Advanced ADL's Functional Problem(s): Simple Meal Preparation OT Transfers Functional Problem(s): Toilet;Tub/Shower OT Additional Impairment(s): None OT Plan OT Intensity: Minimum of 1-2 x/day, 45 to 90 minutes OT Frequency: 5 out of 7 days OT Duration/Estimated Length of Stay: 12-14 days OT Treatment/Interventions: Medical illustrator training;Community reintegration;Discharge planning;DME/adaptive equipment instruction;Neuromuscular re-education;UE/LE Strength taining/ROM;Wheelchair propulsion/positioning;Therapeutic Exercise;Patient/family education;Functional mobility training;Pain management;Self Care/advanced ADL retraining;Therapeutic Activities;UE/LE Coordination activities OT Self Feeding Anticipated Outcome(s): independent OT Basic Self-Care Anticipated Outcome(s): supervision OT Toileting Anticipated Outcome(s): supervision OT Bathroom Transfers  Anticipated Outcome(s): supervision OT Recommendation Recommendations for Other Services: Therapeutic Recreation consult Therapeutic Recreation Interventions: Stress management Patient destination: Home Follow Up Recommendations: Home health OT;24 hour supervision/assistance Equipment Recommended: To be determined   OT Evaluation Precautions/Restrictions  Precautions Precautions: Fall;Other (comment) Precaution Comments: Jp drain x2 Right side / ostomy L side / abdomen binder when up Required Braces or Orthoses: Other Brace Other Brace: abdominal binder when OOB Restrictions Weight Bearing Restrictions: No   Pain  No report of pain  Home Living/Prior Functioning Home Living Family/patient expects to be discharged to:: Private residence Living Arrangements: Spouse/significant other, Children Available Help at Discharge: Family, Available 24 hours/day Type of Home: House Home Access: Level entry Home Layout: One level Bathroom Shower/Tub: Multimedia programmer: Standard Additional Comments: Pt reports spouse works from home   Lives With: Spouse IADL History Homemaking Responsibilities: Yes Meal Prep Responsibility: Therapist, occupational Responsibility: Primary Cleaning Responsibility: Primary Current License: Yes Mode of Transportation: Car Occupation: Unemployed Prior Function Level of Independence: Independent with basic ADLs, Independent with transfers, Independent with gait  Able to Take Stairs?: Yes Driving: Yes Vocation: Unemployed Leisure: Hobbies-yes (Comment) Comments: intermittently babysits 28 y/o grandson, walking, antiquing Vision Baseline Vision/History: Wears glasses Wears Glasses: Reading only Patient Visual Report: No change from baseline Vision Assessment?: No apparent visual deficits Perception  Perception: Within Functional Limits Praxis Praxis: Intact Cognition Overall Cognitive Status: Within Functional Limits for tasks  assessed Arousal/Alertness: Awake/alert Orientation Level: Person;Place;Situation Person: Oriented Place: Oriented Situation: Oriented Year: 2021 Month: August Day of Week: Correct Memory: Impaired Memory Impairment: Storage deficit;Decreased recall of new information Immediate Memory Recall: Sock;Blue;Bed Memory Recall Sock: Not able to recall Memory Recall Blue: With Cue Memory Recall Bed: Not able to recall Attention: Sustained Sustained Attention: Appears intact Awareness: Appears intact Problem Solving: Appears intact Safety/Judgment: Appears intact Comments: Pt with decreased three word recall, but no other deficits noted Sensation Sensation Light Touch: Appears Intact Hot/Cold: Appears Intact Proprioception: Appears Intact Stereognosis:  Appears Intact Additional Comments: Sensation intact in BUEs Coordination Gross Motor Movements are Fluid and Coordinated: Yes Fine Motor Movements are Fluid and Coordinated: Yes Coordination and Movement Description: BUE coordination WFLs Motor  Motor Motor: Abnormal postural alignment and control Motor - Skilled Clinical Observations: generalized deconditioning  Trunk/Postural Assessment  Cervical Assessment Cervical Assessment: Exceptions to [facility] (forward head) Thoracic Assessment Thoracic Assessment: Exceptions to [facility] (thoracic rounding) Lumbar Assessment Lumbar Assessment: Within Functional Limits  Balance Balance Balance Assessed: Yes Static Sitting Balance Static Sitting - Balance Support: Bilateral upper extremity supported;Feet supported Static Sitting - Level of Assistance: 5: Stand by assistance Dynamic Sitting Balance Dynamic Sitting - Balance Support: Feet unsupported;During functional activity Dynamic Sitting - Level of Assistance: 4: Min assist Static Standing Balance Static Standing - Balance Support: During functional activity;Left upper extremity supported Static Standing - Level of Assistance: 2: Max  assist Dynamic Standing Balance Dynamic Standing - Balance Support: During functional activity Dynamic Standing - Level of Assistance: 2: Max assist Extremity/Trunk Assessment RUE Assessment Active Range of Motion (AROM) Comments: WFLs General Strength Comments: overall strength 3+/5 throughout LUE Assessment LUE Assessment: Exceptions to [facility] Active Range of Motion (AROM) Comments: WFLs General Strength Comments: Strength 3+/5 throughout  Care Tool Care Tool Self Care Eating   Eating Assist Level: Independent    Oral Care    Oral Care Assist Level: Set up assist    Bathing   Body parts bathed by patient: Right arm;Left arm;Chest;Abdomen;Right upper leg;Left upper leg;Right lower leg;Left lower leg;Face Body parts bathed by helper: Front perineal area;Buttocks   Assist Level: 2 Helpers    Upper Body Dressing(including orthotics)   What is the patient wearing?: Bra;Pull over shirt   Assist Level: Supervision/Verbal cueing    Lower Body Dressing (excluding footwear)   What is the patient wearing?: Incontinence brief;Pants Assist for lower body dressing: 2 Helpers    Putting on/Taking off footwear   What is the patient wearing?: Non-skid slipper socks;Ted hose Assist for footwear: Moderate Assistance - Patient 50 - 74%       Care Tool Toileting Toileting activity   Assist for toileting: 2 Helpers     Care Tool Bed Mobility Roll left and right activity        Sit to lying activity        Lying to sitting edge of bed activity         Care Tool Transfers Sit to stand transfer   Sit to stand assist level: 2 Helpers    Chair/bed transfer         Toilet transfer         Care Tool Cognition Expression of Ideas and Wants Expression of Ideas and Wants: Without difficulty (complex and basic) - expresses complex messages without difficulty and with speech that is clear and easy to understand   Understanding Verbal and Non-Verbal Content Understanding Verbal and  Non-Verbal Content: Understands (complex and basic) - clear comprehension without cues or repetitions   Memory/Recall Ability *first 3 days only      Refer to Care Plan for Long Term Goals  SHORT TERM GOAL WEEK 1 OT Short Term Goal 1 (Week 1): Pt will complete LB bathing sit to stand with mod assist for two consecutive sessions. OT Short Term Goal 2 (Week 1): Pt will complete LB dressing with mod assist sit to stand for two consecutive sessions. OT Short Term Goal 3 (Week 1): Pt will complete toilet transfers stand pivot with use of the RW  and mod assist. OT Short Term Goal 4 (Week 1): Pt will increase endurance by being able to stand at the sink with mod assist for greater than 2 mins during 1 grooming task.  Recommendations for other services: Therapeutic Recreation  Stress management   Skilled Therapeutic Intervention ADL ADL Eating: Independent Where Assessed-Eating: Wheelchair Grooming: Setup Where Assessed-Grooming: Wheelchair Upper Body Bathing: Supervision/safety Where Assessed-Upper Body Bathing: Wheelchair Lower Body Bathing: Dependent Where Assessed-Lower Body Bathing: Wheelchair;Sitting at sink;Standing at sink Upper Body Dressing: Supervision/safety Where Assessed-Upper Body Dressing: Wheelchair Lower Body Dressing: Dependent Where Assessed-Lower Body Dressing: Wheelchair Toileting: Dependent Where Assessed-Toileting: Bedside Commode Toilet Transfer: Dependent Armed forces technical officer Method: Stand pivot Science writer: Radiographer, therapeutic: Not assessed Social research officer, government: Not assessed Mobility  Transfers Sit to Stand: Maximal Assistance - Patient 25-49% Stand to Sit: Maximal Assistance - Patient 25-49%  Pt with colostomy not sealed to start session.  Therapist assisted with cleaning up of leaking bowel and re-sealing it.  Pt then agreed to completion of bathing and dressing sit to stand at the sink.  Therapist felt shower would not be a  safe option at this time secondary to the extent of her incisions and drain lines.  She was in agreement with this.  She completed UB selfcare in sitting with supervision, but needed total assist +2 for LB selfcare sit to stand using the [place].  Once standing, she was able to let go with her LUE to assist with pulling her pants up however.  No signs of symptoms of light headedness during session so BP was not obtained.    Discharge Criteria: Patient will be discharged from OT if patient refuses treatment 3 consecutive times without medical reason, if treatment goals not met, if there is a change in medical status, if patient makes no progress towards goals or if patient is discharged from hospital.  The above assessment, treatment plan, treatment alternatives and goals were discussed and mutually agreed upon: by patient  , OTR/L 11/24/2019, 4:55 PM

## 2019-11-24 NOTE — Consult Note (Signed)
[facility] Nurse ostomy follow up Patient receiving care in [facility] 4W5. Patient engaged and motivated to learn ostomy care. Stoma type/location: slightly oval shaped, LUQ colostomy Stomal assessment/size: approx 1 1/4 inches side to side, 1 inch top to bottom. Red, moist producing soft brown stool Peristomal assessment: MCS resolved, peristomal skin intact. Treatment options for stomal/peristomal skin: barrier ring Output: soft brown  Ostomy pouching: 1pc. Flat cut to fit Education provided: Patient performed total pouch change process with very little support from me.  Supplies at bedside. Patient encouraged to empty pouch and participate in changing pouch if it leaks.  I plan to return Thursday or Friday to see how things are goin. Enrolled patient in [place] Start Discharge program: No, anticipate enrollment this week once I determine which pouching system seems to work best for her. Val Riles, RN, MSN, CWOCN, CNS-BC, pager [phone]

## 2019-11-24 NOTE — Plan of Care (Signed)
  Problem: RH BOWEL ELIMINATION Goal: RH STG MANAGE BOWEL WITH ASSISTANCE Description: STG Manage Bowel with Assistance. Outcome: Progressing   Problem: RH BLADDER ELIMINATION Goal: RH STG MANAGE BLADDER WITH ASSISTANCE Description: STG Manage Bladder With Assistance Outcome: Progressing Goal: RH STG MANAGE BLADDER WITH EQUIPMENT WITH ASSISTANCE Description: STG Manage Bladder With Equipment With Assistance Outcome: Progressing   Problem: RH SKIN INTEGRITY Goal: RH STG MAINTAIN SKIN INTEGRITY WITH ASSISTANCE Description: STG Maintain Skin Integrity With Assistance. Outcome: Progressing Goal: RH STG ABLE TO PERFORM INCISION/WOUND CARE W/ASSISTANCE Description: STG Able To Perform Incision/Wound Care With Assistance. Outcome: Progressing   Problem: RH SAFETY Goal: RH STG ADHERE TO SAFETY PRECAUTIONS W/ASSISTANCE/DEVICE Description: STG Adhere to Safety Precautions With Assistance/Device. Outcome: Progressing   Problem: Consults Goal: RH GENERAL PATIENT EDUCATION Description: See Patient Education module for education specifics. Outcome: Progressing

## 2019-11-24 NOTE — Progress Notes (Addendum)
Inpatient Rehabilitation Medication Review by a Pharmacist  A complete drug regimen review was completed for this patient to identify any potential clinically significant medication issues.  Clinically significant medication issues were identified:  yes   Type of Medication Issue Identified Description of Issue Urgent (address now) Non-Urgent (address on AM team rounds) Plan Plan Accepted by Provider? (Yes / No / Pending AM Rounds)                    No Medication Administration End Date  Cipro with no end date (suppose to stop 8/24) Non-urgent Address with rehab team in AM                       Name of provider notified for urgent issues identified: Contact PA in AM  Provider Method of Notification: Secure Chat   For non-urgent medication issues to be resolved on team rounds tomorrow morning a [facility] Secure Chat Handoff was sent to:    Pharmacist comments: Message to stop Cipro 11/24/19  Time spent performing this drug regimen review (minutes):  10   [place] 11/24/2019 1:35 AM

## 2019-11-24 NOTE — Progress Notes (Signed)
Patient ID: Natasha Barrera, female   DOB: Jun 23, 1965, 54 y.o.   MRN: [id number]  SW met with pt in room and provided udpates from team conference, and informed there will be continue to be updates on d/c date and recommendations.   Loralee Pacas, MSW, [place] Office: [phone] Cell: [phone] Fax: [phone]

## 2019-11-24 NOTE — Progress Notes (Signed)
Patient information reviewed and entered into eRehab System by Becky Emilyn Ruble, PPS coordinator. Information including medical coding, function ability, and quality indicators will be reviewed and updated through discharge.   

## 2019-11-25 ENCOUNTER — Inpatient Hospital Stay (HOSPITAL_COMMUNITY): Payer: 59 | Admitting: Physical Therapy

## 2019-11-25 ENCOUNTER — Inpatient Hospital Stay (HOSPITAL_COMMUNITY): Payer: 59 | Admitting: Occupational Therapy

## 2019-11-25 ENCOUNTER — Telehealth: Payer: Self-pay

## 2019-11-25 MED ORDER — ACETAMINOPHEN 325 MG PO TABS
650.0000 mg | ORAL_TABLET | Freq: Four times a day (QID) | ORAL | Status: DC | PRN
  Administered 2019-11-25 – 2019-12-08 (×12): 650 mg via ORAL
  Filled 2019-11-25 (×13): qty 2

## 2019-11-25 NOTE — Telephone Encounter (Signed)
Called the patient. No answer. Left message asking she call back. Offering an appointment 12/10/19 at 11:30 am.

## 2019-11-25 NOTE — Care Management (Addendum)
Inpatient [place] Individual Statement of Services  Patient Name:  Natasha Barrera  Date:  11/25/2019  Welcome to the [facility].  Our goal is to provide you with an individualized program based on your diagnosis and situation, designed to meet your specific needs.  With this comprehensive rehabilitation program, you will be expected to participate in at least 3 hours of rehabilitation therapies Monday-Friday, with modified therapy programming on the weekends.  Your rehabilitation program will include the following services:  Physical Therapy (PT), Occupational Therapy (OT), 24 hour per day rehabilitation nursing, Therapeutic Recreaction (TR), Psychology, Neuropsychology, Care Coordinator, Rehabilitation Medicine, Nutrition Services, Pharmacy Services and Other  Weekly team conferences will be held on Tuesdays to discuss your progress.  Your Inpatient Rehabilitation Care Coordinator will talk with you frequently to get your input and to update you on team discussions.  Team conferences with you and your family in attendance may also be held.  Expected length of stay: 12-14 days   Overall anticipated outcome: Minimal Assistance  Depending on your progress and recovery, your program may change. Your Inpatient Rehabilitation Care Coordinator will coordinate services and will keep you informed of any changes. Your Inpatient Rehabilitation Care Coordinator's name and contact numbers are listed  below.  The following services may also be recommended but are not provided by the [facility] will be made to provide these services after discharge if needed.  Arrangements include referral to agencies that provide these services.  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  Inpatient Rehabilitation Care Coordinator:  Cathleen Corti [phone] or ([phone]  Information discussed with and copy given to patient by: Rana Snare, 11/25/2019, 11:50 AM

## 2019-11-25 NOTE — Progress Notes (Signed)
Physical Therapy Session Note  Patient Details  Name: Natasha Barrera MRN: [id number] Date of Birth: 10-Sep-1965  Today's Date: 11/25/2019 PT Individual Time: 1405-1430 PT Individual Time Calculation (min): 25 min   Short Term Goals: Week 1:  PT Short Term Goal 1 (Week 1): Pt will complete bed<>w/c with mod assist +1 & LRAD. PT Short Term Goal 2 (Week 1): Pt will ambulate 25 ft with mod assist & LRAD. PT Short Term Goal 3 (Week 1): Pt will complete sit<>stand consistently, from all seat heights, with mod assist +1 & LRAD.  Skilled Therapeutic Interventions/Progress Updates:   Pt received supine in bed and agreeable to PT. Supine>sit transfer with supervision assist and cues for log roll technique and proper UE placement to push into sitting. Mild orthostatic s/s in sitting that decreased after 30 sec. SB transfers to and from [facility] with min assist throughout session with cues for LE placement, head hips relationship, and improved clearance of gluteal surface to prevent sheer forces.  WC mobility through rehab unit 2 x 110f with cues for safety in tight spaces. Nustep BUE/BLE endurance training, level 2 with cues to remain in pain free range, x 8 minutes. [facility] and all needs met.          Therapy Documentation Precautions:  Precautions Precautions: Fall, Other (comment) Precaution Comments: Jp drain x2 Right side / ostomy L side Required Braces or Orthoses: Other Brace Other Brace: ted hose, abdominal binder is optional if pt wants it for comfort or not (cannot be wrapped tightly enough to assist with BP management) Restrictions Weight Bearing Restrictions: No Vital Signs: Therapy Vitals Temp: 98.2 F (36.8 C) Temp Source: Oral Pulse Rate: 88 Resp: 17 BP: (!) 90/42 (RN Yinka notified) Patient Position (if appropriate): Lying Oxygen Therapy SpO2: 99 % O2 Device: Room Air Pain: Pain Assessment Pain Scale:  0-10 Pain Score: 0-No pain    Therapy/Group: Individual Therapy  ALorie Phenix8/25/2021, 4:50 PM

## 2019-11-25 NOTE — Progress Notes (Signed)
Physical Therapy Session Note  Patient Details  Name: Natasha Barrera MRN: [id number] Date of Birth: 08-19-1965  Today's Date: 11/25/2019 PT Individual Time: 1049-1200 and 1515-1540 PT Individual Time Calculation (min): 71 min and 25 min  Short Term Goals: Week 1:  PT Short Term Goal 1 (Week 1): Pt will complete bed<>w/c with mod assist +1 & LRAD. PT Short Term Goal 2 (Week 1): Pt will ambulate 25 ft with mod assist & LRAD. PT Short Term Goal 3 (Week 1): Pt will complete sit<>stand consistently, from all seat heights, with mod assist +1 & LRAD.  Skilled Therapeutic Interventions/Progress Updates:  Treatment 1: Pt received in w/c & agreeable to tx. Pt propels w/c around unit with BUE & supervision. Pt stood in standing frame 3 minutes (vitals after: BP = 95/62, HR = 99 bpm) + 4 minutes + 1 minute for BLE strengthening through weight bearing. Pt utilized kinetron from w/c level on 50 cm/sec resistance 1 minute x 3 trials for BLE strengthening. Slide board transfer w/c<>drop arm commode over toilet & w/c>bed with min assist with only cuing for safety/hand placement to not pull JP drains. PT places board total assist. PT provides total assist for clothing management with pt performing lateral leans on toilet. Pt with continent void & pt performs peri hygiene. Hand hygiene at sink from w/c level with set up assist. Back in bed, pt performs BLE heel slides & short arc quads (1-2 sets x 15 reps) with instructional cuing for technique. At end of session pt left in bed with alarm set & call bell, all needs in reach. Pt with no c/o dizziness or adverse symptoms throughout session. Pt with B thigh high teds donned & pt doffed abdominal binder at beginning of session after PT educated her she can only wear it for comfort as it's not tight enough to assist with BP (per MD note).  Pain: 0/10 at rest, 3/10 in abdomen with movement, rest breaks provided PRN   Treatment 2: Pt received in w/c & agreeable to tx. Pt  propels w/c room<>gym with BUE & supervision. Sit<>stand in parallel bars with mod assist & PT blocking BLE knees. Pt able to stand ~1.5 minutes before sitting 2/2 fatigue. Pt heavily utilizes BUE to upright self in standing 2/2 weak glutes/hip extensors. Pt returns to standing in // bars & ambulates 2 ft forwards + back to w/c with PT blocking knees to prevent buckling. Pt reports difficulty activating glutes in standing, as this causes abdominal pain. Back in room, pt transfers bed>w/c via slide board with PT providing max assist for board placement, supervision for pt to complete transfer. Pt completes bed mobility with PT educating pt on log rolling technique to protect stomach. Pt rolls with supervision, L sidelying>sitting with CGA, sitting>L sidelying with min assist to place LE onto bed. Pt performs bridging with PT stabilizing BLE with pt able to activate glutes but minimal to no clearance off of bed. Pt performs supine glute sets with 5 second hold with PT educating pt to perform exercise 10 repetitions each hour (pt denies pain in abdomen when activating glutes in supine position). Pt left in bed with alarm set & all needs in reach. Pain:  4/10 total body soreness, rest breaks provided PRN with pt noting resting between sessions helps significantly  Therapy Documentation Precautions:  Precautions Precautions: Fall, Other (comment) Precaution Comments: Jp drain x2 Right side / ostomy L side Required Braces or Orthoses: Other Brace Other Brace: ted hose, abdominal binder  is optional if pt wants it for comfort or not (cannot be wrapped tightly enough to assist with BP management) Restrictions Weight Bearing Restrictions: No   Therapy/Group: Individual Therapy  [name] 11/25/2019, 3:52 PM

## 2019-11-25 NOTE — Progress Notes (Signed)
Occupational Therapy Session Note  Patient Details  Name: Natasha Barrera MRN: [id number] Date of Birth: 16-Jun-1965  Today's Date: 11/25/2019 OT Individual Time: 2440-1027 OT Individual Time Calculation (min): 58 min    Short Term Goals: Week 1:  OT Short Term Goal 1 (Week 1): Pt will complete LB bathing sit to stand with mod assist for two consecutive sessions. OT Short Term Goal 2 (Week 1): Pt will complete LB dressing with mod assist sit to stand for two consecutive sessions. OT Short Term Goal 3 (Week 1): Pt will complete toilet transfers stand pivot with use of the RW and mod assist. OT Short Term Goal 4 (Week 1): Pt will increase endurance by being able to stand at the sink with mod assist for greater than 2 mins during 1 grooming task.  Skilled Therapeutic Interventions/Progress Updates:    Patient in bed, nursing finishing wound care to abdomin.  Patient states that pain is under control and she slept well last night.  Rolling in bed with CS, max A to change brief and wash buttocks.  She is able to thread both legs in pants today and requires min A for CM with hip hike and rolling.  Side lying to sitting edge of bed with mod A.  She notes mild dizziness with change of position.  CS with unsupported sitting.  SB transfer bed to w/c with min A.  Completed grooming/oral care, UB bathing and dressing w/c level at sink with set up assist.   Completed ergometer 3 x 1.5 minutes.  Completed weight shift and attempts at w/c push ups but unable to clear buttocks.  SB transfer to/from therapy mat table with min A.  Changed to roho cushion and reviewed importance of checking inflation and ongoing weight shifts - she verbalizes understanding.   She remained seated in w/c at close of session, seat belt alarm set and call bell in hand.    Therapy Documentation Precautions:  Precautions Precautions: Fall, Other (comment) Precaution Comments: Jp drain x2 Right side / ostomy L side / abdomen binder when  up Required Braces or Orthoses: Other Brace Other Brace: abdominal binder when OOB Restrictions Weight Bearing Restrictions: No   Therapy/Group: Individual Therapy  Carlos Levering 11/25/2019, 7:33 AM

## 2019-11-25 NOTE — Progress Notes (Signed)
Patient Details  Name: Natasha Barrera MRN: [id number] Date of Birth: 09/05/1965  Today's Date: 11/25/2019  Hospital Problems: Principal Problem:   Debility Active Problems:   Protein-calorie malnutrition, severe ([place])  Past Medical History:  Past Medical History:  Diagnosis Date   Diverticul disease small and large intestine, no perforati or abscess    Diverticulitis of colon    Hypertension    Past Surgical History:  Past Surgical History:  Procedure Laterality Date   APPLICATION OF WOUND VAC N/A 11/04/2019   Procedure: APPLICATION OF WOUND VAC;  Surgeon: Donnie Mesa, MD;  Location: [place];  Service: General;  Laterality: N/A;   BIOPSY  10/21/2019   Procedure: BIOPSY;  Surgeon: Irene Shipper, MD;  Location: [facility] ENDOSCOPY;  Service: Endoscopy;;   COLON SURGERY  12/25/2011   COLONOSCOPY     COLONOSCOPY WITH PROPOFOL N/A 10/21/2019   Procedure: COLONOSCOPY WITH PROPOFOL;  Surgeon: Irene Shipper, MD;  Location: [place];  Service: Endoscopy;  Laterality: N/A;   COLOSTOMY Left 10/24/2019   Procedure: COLOSTOMY;  Surgeon: Clovis Riley, MD;  Location: [place];  Service: General;  Laterality: Left;   ESOPHAGOGASTRODUODENOSCOPY (EGD) WITH PROPOFOL N/A 10/21/2019   Procedure: ESOPHAGOGASTRODUODENOSCOPY (EGD) WITH PROPOFOL;  Surgeon: Irene Shipper, MD;  Location: [facility] ENDOSCOPY;  Service: Endoscopy;  Laterality: N/A;  with small bowel BX's   HEMORRHOID SURGERY  2002   LAPAROTOMY N/A 10/24/2019   Procedure: EXPLORATORY LAPAROTOMY;  Surgeon: Clovis Riley, MD;  Location: [place];  Service: General;  Laterality: N/A;   LAPAROTOMY N/A 11/04/2019   Procedure: EXPLORATORY LAPAROTOMY FOR DEHISCENCE;  Surgeon: Donnie Mesa, MD;  Location: [place];  Service: General;  Laterality: N/A;   VENTRAL HERNIA REPAIR N/A 12/05/2018   Procedure: LAPAROSCOPIC VENTRAL HERNIA REPAIR WITH MESH;  Surgeon: Jovita Kussmaul, MD;  Location: [place];  Service: General;  Laterality: N/A;   Social History:   reports that she has quit smoking. She has never used smokeless tobacco. She reports current alcohol use. She reports that she does not use drugs.  Family / Support Systems Marital Status: Married Patient Roles: Spouse, Partner Spouse/Significant Other: Herbie Baltimore "Mikki Santee" Neuharth (husband): [phone] Children: 3 adult sons; 2 local and one in [place] Other Supports: None reported Anticipated Caregiver: husband Ability/Limitations of Caregiver: 24/7 care since husband works from home at this time. Pt states her husband has option to continue to work from home if needed. 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 Education: high school grad Read: Yes Write: Yes Employment Status: Unemployed Date Retired/Disabled/Unemployed: N/A Public relations account executive Issues: Denies Guardian/Conservator: N/A   Abuse/Neglect Abuse/Neglect Assessment Can Be Completed: Yes Physical Abuse: Denies Verbal Abuse: Denies Sexual Abuse: Denies Exploitation of patient/patient's resources: Denies Self-Neglect: Denies  Emotional Status Pt's affect, behavior and adjustment status: Pt in good spirits at time of visit Recent Psychosocial Issues: situational depression Psychiatric History: denies Substance Abuse History: Pt admits she quit smoking cigarettes 18 yrs ago. Moderate etoh use.  Patient / Family Perceptions, Expectations & Goals Pt/Family understanding of illness & functional limitations: Pt has a general understanding of care needs Premorbid pt/family roles/activities: some assistance Anticipated changes in roles/activities/participation: Assistance with ADLs/IADLs  Community Resources Express Scripts: None Premorbid Home Care/DME Agencies: None Transportation available at discharge: Husband Resource referrals recommended: Neuropsychology  Discharge Planning Living  Arrangements: Spouse/significant other Support Systems: Spouse/significant other Type of Residence: Private residence  Insurance Resources: Multimedia programmer (specify) Scientist, clinical (histocompatibility and immunogenetics)) Financial Resources: Family Support Financial Screen Referred: No Living Expenses: Own Money Management: Spouse Does the patient have any problems obtaining your medications?: No Care Coordinator Barriers to Discharge: Decreased caregiver support, Lack of/limited family support Expected length of stay: 12-14 days  Clinical Impression SW met with pt in room at bedside to introduce self, explain role, and discuss discharge process. Pt is not a English as a second language teacher. HCPOA- husband Herbie Baltimore (no documentation on file). DME access to RW: w/c, and shower chair.  Edger Husain A Jamica Woodyard 11/25/2019, 11:52 AM

## 2019-11-25 NOTE — Plan of Care (Signed)
  Problem: RH BOWEL ELIMINATION Goal: RH STG MANAGE BOWEL WITH ASSISTANCE Description: STG Manage Bowel with Assistance. Outcome: Progressing   Problem: RH BLADDER ELIMINATION Goal: RH STG MANAGE BLADDER WITH ASSISTANCE Description: STG Manage Bladder With Assistance Outcome: Progressing Goal: RH STG MANAGE BLADDER WITH EQUIPMENT WITH ASSISTANCE Description: STG Manage Bladder With Equipment With Assistance Outcome: Progressing   Problem: RH SKIN INTEGRITY Goal: RH STG MAINTAIN SKIN INTEGRITY WITH ASSISTANCE Description: STG Maintain Skin Integrity With Assistance. Outcome: Progressing Goal: RH STG ABLE TO PERFORM INCISION/WOUND CARE W/ASSISTANCE Description: STG Able To Perform Incision/Wound Care With Assistance. Outcome: Progressing   Problem: RH SAFETY Goal: RH STG ADHERE TO SAFETY PRECAUTIONS W/ASSISTANCE/DEVICE Description: STG Adhere to Safety Precautions With Assistance/Device. Outcome: Progressing   Problem: RH PAIN MANAGEMENT Goal: RH STG PAIN MANAGED AT OR BELOW PT'S PAIN GOAL Outcome: Progressing   Problem: Consults Goal: RH GENERAL PATIENT EDUCATION Description: See Patient Education module for education specifics. Outcome: Progressing

## 2019-11-25 NOTE — Telephone Encounter (Signed)
-----   Message from Noralyn Pick, NP sent at 11/23/2019  8:48 AM EDT ----- Regarding: FW: Follow-up Hi Beth, See Dr. Donneta Romberg message. I never saw patient in the hospital, Nevin Bloodgood saw her once. Please contact patient and schedule her for a follow up appt with Nevin Bloodgood,  me or any APP who has available appt next few weeks.  Thx  ----- Message ----- From: Irving Copas., MD Sent: 11/16/2019  10:21 PM EDT To: Irene Shipper, MD, Vena Rua, PA-C, # Subject: Follow-up                                      Hello all,GI is going to sign off at this time with plan for continued oral steroids as per the previous discussion that Dr. Elmo Putt had with [facility].She has a long ways in rehabilitation before biologic therapy but hopefully she will make it there.Follow-up with NP Berniece Pap or with 1 of you all will be ideal in the course of the coming few weeks.She will have follow-up with surgery scheduled by them.Thanks.GM

## 2019-11-25 NOTE — Progress Notes (Signed)
Referring Physician(s): Rolm Bookbinder ([place])  Supervising Physician: Corrie Mckusick  Patient Status: CIR-inpt  Chief Complaint: None  Subjective:  History of diverticulitis s/p sigmoid colectomy in 2013 who presented with diffuse colitis with large perforation in the region of prior rectosigmoid anastomosis s/p exploratory laparotomy with Harthman's procedure in OR 10/24/2019 by Dr. Kae Heller; complicated by fascial dehiscence, mesenteric hematoma, pelvic abscess, and mucocutaneous separation of colostomy s/p exploratory laparotomy for colostomy revision with drain placement x1 and wound vac placement in OR 11/04/2019 by Dr. Georgette Dover; further complicated by development of intra-abdominal fluid collection s/p RLQ drain placement in IR 11/11/2019 by Dr. Kathlene Cote. Patient awake and alert sitting in wheelchair with no complaints at this time. States she just finished working with PT this AM. RLQ drain site c/d/i.   Allergies: Patient has no known allergies.  Medications: Prior to Admission medications   Medication Sig Start Date End Date Taking? Authorizing Provider  Calcium Carb-Cholecalciferol (CALCIUM+D3 PO) Take 1 tablet by mouth daily.    [provider]  Cholecalciferol (VITAMIN D-3) 125 MCG (5000 UT) TABS Take 5,000 Units by mouth daily.    [provider]  escitalopram (LEXAPRO) 10 MG tablet Take 1 tablet (10 mg total) by mouth daily. 11/24/19   Briant Cedar, MD  gabapentin (NEURONTIN) 100 MG capsule Take 2 capsules (200 mg total) by mouth 2 (two) times daily. 11/23/19 12/23/19  Briant Cedar, MD  loperamide (IMODIUM A-D) 2 MG tablet Take 1 tablet (2 mg total) by mouth 4 (four) times daily as needed for diarrhea or loose stools. 09/20/19   Maudie Mercury, MD  methocarbamol (ROBAXIN) 500 MG tablet Take 2 tablets (1,000 mg total) by mouth every 8 (eight) hours. 11/23/19 12/23/19  Briant Cedar, MD  Multiple Vitamin (MULTIVITAMIN WITH MINERALS) TABS  tablet Take 1 tablet by mouth daily. Centrum Silver    [provider]  pantoprazole (PROTONIX) 40 MG tablet Take 1 tablet (40 mg total) by mouth daily at 6 (six) AM. 11/24/19   Pashayan, Redgie Grayer, MD  predniSONE (DELTASONE) 20 MG tablet Take 1 tablet (20 mg total) by mouth daily before breakfast. 11/24/19   Briant Cedar, MD     Vital Signs: BP (!) 91/59 (BP Location: Left Arm)   Pulse 94   Temp 98.5 F (36.9 C) (Oral)   Resp 18   Ht 5' 1"  (1.549 m)   Wt 102 lb 8.2 oz (46.5 kg)   LMP 12/15/2014   SpO2 95%   BMI 19.37 kg/m   Physical Exam Vitals and nursing note reviewed.  Constitutional:      General: She is not in acute distress. Pulmonary:     Effort: Pulmonary effort is normal. No respiratory distress.  Abdominal:     Comments: (+) surgical drain. RLQ drain site without tenderness, active bleeding, drainage, or erythema noted; approximately 10 cc beige/tan fluid in suction bulb; drain flushes/aspirates without resistance.   Skin:    General: Skin is warm and dry.  Neurological:     Mental Status: She is alert and oriented to person, place, and time.     Imaging: CT ABDOMEN PELVIS W CONTRAST  Result Date: 11/21/2019 CLINICAL DATA:  Abdominal abscess, purulence drainage, previous laparotomy EXAM: CT ABDOMEN AND PELVIS WITH CONTRAST TECHNIQUE: Multidetector CT imaging of the abdomen and pelvis was performed using the standard protocol following bolus administration of intravenous contrast. CONTRAST:  167m OMNIPAQUE IOHEXOL 300 MG/ML  SOLN COMPARISON:  11/11/2019 FINDINGS: Lower chest: Left greater  than right pleural effusion and basilar consolidation, not significantly changed since prior study. Hepatobiliary: No focal liver abnormality is seen. No gallstones, gallbladder wall thickening, or biliary dilatation. Pancreas: Unremarkable. No pancreatic ductal dilatation or surrounding inflammatory changes. Spleen: Normal in size without focal abnormality.  Adrenals/Urinary Tract: Nonobstructing right renal calculi are again identified largest measuring 8 mm. There is mild distension of the proximal right ureter, likely due to extrinsic compression due to residual inflammatory change in the right hemiabdomen. No hydronephrosis. The left kidney is unremarkable. The bladder is grossly normal.  The adrenals are unremarkable. Stomach/Bowel: Diverting colostomy left mid abdomen again noted. There is no bowel obstruction or ileus. Vascular/Lymphatic: Stable atherosclerosis throughout the abdominal aorta and its branches. No pathologic adenopathy. Reproductive: Uterus and bilateral adnexa are unremarkable. Other: Postsurgical changes are seen from prior midline laparotomy, with continued surgical packing and external dressing at the laparotomy site. There is no evidence of pneumoperitoneum. Trace free fluid is seen within the pericolic gutters and lower pelvis. The fluid collections seen in the right lower abdomen on prior CT has been completely evacuated with interval placement of an indwelling pigtail drainage catheter. There is a persistent surgical drain entering the right mid abdomen, coiled within the lower pelvis. Musculoskeletal: There are no acute or destructive bony lesions. Reconstructed images demonstrate no additional findings. IMPRESSION: 1. Complete resolution of the right mid abdominal abscess after pigtail drainage catheter placement. No recurrence or residual fluid collection. 2. Trace free fluid within the paracolic gutters and lower pelvis. 3. Postsurgical changes from laparotomy, distal colectomy, and left mid abdominal colostomy, stable. 4. Small left pleural effusion and trace right pleural effusion, grossly stable. 5. Nonobstructing right renal calculi. There is some mild distension of the proximal right ureter, likely due to extrinsic compression from residual inflammatory changes overlying the right psoas muscle. No hydronephrosis. 6.  Aortic  Atherosclerosis (ICD10-I70.0). Electronically Signed   By: Randa Ngo M.D.   On: 11/21/2019 15:24    Labs:  CBC: Recent Labs    11/21/19 0500 11/22/19 0500 11/23/19 0712 11/24/19 0933  WBC 7.6 6.9 8.0 11.3*  HGB 8.5* 8.6* 8.6* 10.2*  HCT 27.5* 28.4* 27.5* 32.2*  PLT 457* 410* 414* 429*    COAGS: Recent Labs    10/31/19 1721 11/06/19 0725  INR 1.2 1.1  APTT  --  32    BMP: Recent Labs    11/21/19 0500 11/22/19 0500 11/23/19 0712 11/24/19 0933  NA 135 133* 134* 131*  K 3.5 3.5 4.3 4.2  CL 102 100 102 97*  CO2 25 24 25 24   GLUCOSE 80 83 89 113*  BUN <5* <5* 5* 5*  CALCIUM 8.3* 7.9* 8.3* 8.7*  CREATININE 0.37* 0.34* 0.31* 0.37*  GFRNONAA >60 >60 >60 >60  GFRAA >60 >60 >60 >60    LIVER FUNCTION TESTS: Recent Labs    11/05/19 0445 11/10/19 0415 11/19/19 0500 11/24/19 0933  BILITOT 0.7 0.6 0.3 0.5  AST 21 11* 10* 19  ALT 127* 23 11 15   ALKPHOS 126 112 76 104  PROT 4.4* 4.6* 4.8* 5.8*  ALBUMIN 1.9* 1.7* 1.5* 2.1*    Assessment and Plan:  History of diverticulitis s/p sigmoid colectomy in 2013 who presented with diffuse colitis with large perforation in the region of prior rectosigmoid anastomosis s/p exploratory laparotomy with Harthman's procedure in OR 10/24/2019 by Dr. Kae Heller; complicated by fascial dehiscence, mesenteric hematoma, pelvic abscess, and mucocutaneous separation of colostomy s/p exploratory laparotomy for colostomy revision with drain placement x1 and  wound vac placement in OR 11/04/2019 by Dr. Georgette Dover; further complicated by development of intra-abdominal fluid collection s/p RLQ drain placement in IR 11/11/2019 by Dr. Kathlene Cote. RLQ drain stable with approximately 10 cc beige/tan fluid in suction bulb (additional 5.5 cc output from drain in past 24 hours per chart). Continue current drain management- continue with Qshift flushes/monitor of output. Plan for repeat CT/possible drain injection when output <10 cc/day (assess for possible  removal). Further plans per rehab/CCS- appreciate and agree with management. IR to follow.   Electronically Signed: Earley Abide, PA-C 11/25/2019, 10:31 AM   I spent a total of 25 Minutes at the the patient's bedside AND on the patient's hospital floor or unit, greater than 50% of which was counseling/coordinating care for intra-abdominal fluid collection s/p RLQ drain placement.

## 2019-11-25 NOTE — Progress Notes (Signed)
Refused scheduled tylenol. JP drains emptied and charged 3 times this shift, drainage malodorous.  Irrigated with 60m of NS. Ostomy emptied 3 times, 2071m 5095mnd 73m36mushy stool. +1 pitting edema to bilateral feet. Bilateral heels red, elevated off bed with pillows. Abdominal incision wound changed this AM. No PRN meds given. [name]

## 2019-11-25 NOTE — Progress Notes (Signed)
Subjective: CC: Doing well. Some soreness around her midline incision, otherwise no pain. Tolerating her diet without n/v. Having colostomy output.   Objective: Vital signs in last 24 hours: Temp:  [97.4 F (36.3 C)-98.5 F (36.9 C)] 98.5 F (36.9 C) (08/25 0500) Pulse Rate:  [84-94] 94 (08/25 0500) Resp:  [18] 18 (08/25 0500) BP: (91-98)/(57-59) 91/59 (08/25 0500) SpO2:  [95 %-98 %] 95 % (08/25 0500) Weight:  [46.5 kg] 46.5 kg (08/25 0500) Last BM Date: 11/24/19  Intake/Output from previous day: 08/24 0701 - 08/25 0700 In: 245 [P.O.:240] Out: 213 [Drains:13; Stool:200] Intake/Output this shift: Total I/O In: 120 [P.O.:120] Out: -   PE: Gen: Awake and alert, NAD Lungs: Normal rate and effort  Abd: Soft, ND, mild tenderness generalized without peritonitis. +BS. Colostomy with brown stool in bag. Midline wound as noted below. Retention sutures in place. IR drain with tan, scant purulent like fluid. Surgical drain with tan, scant, purulent like fluid Msk: No LE edema     Lab Results:  Recent Labs    11/23/19 0712 11/24/19 0933  WBC 8.0 11.3*  HGB 8.6* 10.2*  HCT 27.5* 32.2*  PLT 414* 429*   BMET Recent Labs    11/23/19 0712 11/24/19 0933  NA 134* 131*  K 4.3 4.2  CL 102 97*  CO2 25 24  GLUCOSE 89 113*  BUN 5* 5*  CREATININE 0.31* 0.37*  CALCIUM 8.3* 8.7*   PT/INR No results for input(s): LABPROT, INR in the last 72 hours. CMP     Component Value Date/Time   NA 131 (L) 11/24/2019 0933   K 4.2 11/24/2019 0933   CL 97 (L) 11/24/2019 0933   CO2 24 11/24/2019 0933   GLUCOSE 113 (H) 11/24/2019 0933   BUN 5 (L) 11/24/2019 0933   CREATININE 0.37 (L) 11/24/2019 0933   CALCIUM 8.7 (L) 11/24/2019 0933   PROT 5.8 (L) 11/24/2019 0933   ALBUMIN 2.1 (L) 11/24/2019 0933   AST 19 11/24/2019 0933   ALT 15 11/24/2019 0933   ALKPHOS 104 11/24/2019 0933   BILITOT 0.5 11/24/2019 0933   GFRNONAA >60 11/24/2019 0933   GFRAA >60 11/24/2019 0933   Lipase       Component Value Date/Time   LIPASE 18 09/19/2019 1631       Studies/Results: No results found.  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 Henrene Pastor, per GI, started Solumedrol 7/27;GI has decreased steroid,on PO prednisone,will not plan to start biologics until infection is under control and surgical wound is completely healed  Sigmoid colon perforation-  - 10/24/19:partial colectomy and end colostomy by Dr. Kae Heller.  - 10/30/19: large mesenteric hematoma noted, hep gtt held - 11/04/19:fascial dehiscence with visible transverse colon: S/P exploratory laparotomy, colostomy revision, fascial closure, placement 4 retention sutures, placement of wound VAC, Dr. Georgette Dover - 8/11: right abdominal abscess, s/p IR drain, culture KLEBSIELLA and PSEUDOMONAS AERUGINOSA, switched from zosyn to cipro 8/18. Will need 14days of abx  - 8/21: CT A/P w/ complete resolution of the right mid abdominal  abscess with no recurrence or residual fluid collection.  - Cont BID wet to dry dressing changes -ContinuesurgicalJP drain-likely has breakdown of rectal stump, but JP drains controlling output and patient is not ill from this. Nothing further to do. Continue to monitor. Do not remove.  -Tolerating soft diet and ostomy functioning - Continue retention sutures. We will reassess during follow up appointment in the office.  - Follow up arranged with Dr. Kae Heller on 9/9 - We will sign off. Please call back for any questions or concerns.    LOS: 2 days    Jillyn Ledger , [facility]  Surgery 11/25/2019, 12:55 PM Please see Amion for pager number during day hours 7:00am-4:30pm

## 2019-11-25 NOTE — Progress Notes (Signed)
[place] PHYSICAL MEDICINE & REHABILITATION PROGRESS NOTE   Subjective/Complaints: Has been refusing Tylenol scheduled. Her pain is well controlled. Made prn and she is agreeable Has some dizziness yesterday morning with therapy- midodrine and stockings started  ROS: Patient denies fever, rash, sore throat, blurred vision,   vomiting, diarrhea, cough, shortness of breath or chest pain, headache, or mood change.    Objective:   No results found. Recent Labs    11/23/19 0712 11/24/19 0933  WBC 8.0 11.3*  HGB 8.6* 10.2*  HCT 27.5* 32.2*  PLT 414* 429*   Recent Labs    11/23/19 0712 11/24/19 0933  NA 134* 131*  K 4.3 4.2  CL 102 97*  CO2 25 24  GLUCOSE 89 113*  BUN 5* 5*  CREATININE 0.31* 0.37*  CALCIUM 8.3* 8.7*    Intake/Output Summary (Last 24 hours) at 11/25/2019 0831 Last data filed at 11/25/2019 6644 Gross per 24 hour  Intake 245 ml  Output 213 ml  Net 32 ml     Physical Exam: Vital Signs Blood pressure (!) 91/59, pulse 94, temperature 98.5 F (36.9 C), temperature source Oral, resp. rate 18, height 5' 1"  (1.549 m), weight 46.5 kg, last menstrual period 12/15/2014, SpO2 95 %. General: Alert and oriented x 3, No apparent distress HEENT: Head is normocephalic, atraumatic, PERRLA, EOMI, sclera anicteric, oral mucosa pink and moist, dentition intact, ext ear canals clear,  Neck: Supple without JVD or lymphadenopathy Heart: Reg rate and rhythm. No murmurs rubs or gallops Chest: CTA bilaterally without wheezes, rales, or rhonchi; no distress Abdomen: Soft, non-tender, non-distended, bowel sounds positive. Ostomy sealed with liquid stool present Extremities: No clubbing, cyanosis, or edema. Pulses are 2+ Skin: abdominal wound with granulation and retention sutures. JP drains with scant tan-colored, cloudy drainage.  Neuro: Pt is cognitively appropriate with normal insight, memory, and awareness. Cranial nerves 2-12 are intact. Sensory exam is normal. Reflexes are  2+ in all 4's. Fine motor coordination is intact. No tremors. Motor function is grossly 4-/5 UE prox to 4/5 disally. LE: 2/5 HF, 3/5 KE, 4-/5 ADF/PF.  Musculoskeletal: Full ROM, No pain with AROM or PROM in the neck, trunk, or extremities. Posture appropriate Psych: Pt's affect is appropriate. Pt is cooperative       Assessment/Plan: 1. Functional deficits secondary to debility which require 3+ hours per day of interdisciplinary therapy in a comprehensive inpatient rehab setting.  Physiatrist is providing close team supervision and 24 hour management of active medical problems listed below.  Physiatrist and rehab team continue to assess barriers to discharge/monitor patient progress toward functional and medical goals  Care Tool:  Bathing    Body parts bathed by patient: Right arm, Left arm, Chest, Abdomen, Right upper leg, Left upper leg, Right lower leg, Left lower leg, Face   Body parts bathed by helper: Front perineal area, Buttocks     Bathing assist Assist Level: 2 Helpers     Upper Body Dressing/Undressing Upper body dressing   What is the patient wearing?: Bra, Pull over shirt    Upper body assist Assist Level: Supervision/Verbal cueing    Lower Body Dressing/Undressing Lower body dressing      What is the patient wearing?: Incontinence brief     Lower body assist Assist for lower body dressing: Maximal Assistance - Patient 25 - 49%     Toileting Toileting    Toileting assist Assist for toileting: Maximal Assistance - Patient 25 - 49%     Transfers Chair/bed transfer  Transfers assist     Chair/bed transfer assist level: 2 Helpers     Locomotion Ambulation   Ambulation assist   Ambulation activity did not occur: Safety/medical concerns          Walk 10 feet activity   Assist  Walk 10 feet activity did not occur: Safety/medical concerns        Walk 50 feet activity   Assist Walk 50 feet with 2 turns activity did not occur:  Safety/medical concerns         Walk 150 feet activity   Assist Walk 150 feet activity did not occur: Safety/medical concerns         Walk 10 feet on uneven surface  activity   Assist Walk 10 feet on uneven surfaces activity did not occur: Safety/medical concerns         Wheelchair     Assist Will patient use wheelchair at discharge?:  (transport w/c) Type of Wheelchair: Manual    Wheelchair assist level: Supervision/Verbal cueing Max wheelchair distance: 150 ft    Wheelchair 50 feet with 2 turns activity    Assist        Assist Level: Supervision/Verbal cueing   Wheelchair 150 feet activity     Assist      Assist Level: Supervision/Verbal cueing   Blood pressure (!) 91/59, pulse 94, temperature 98.5 F (36.9 C), temperature source Oral, resp. rate 18, height 5' 1"  (1.549 m), weight 46.5 kg, last menstrual period 12/15/2014, SpO2 95 %.  Medical Problem List and Plan: 1.  Debility secondary to Crohn's disease/diverticulitis status post exploratory laparotomy with Hartman's procedure 6/97/9480 complicated by fascial dehiscence mesenteric hematoma pelvic abscess and mucocutaneous separation of colostomy status post exploratory laparotomy colostomy revision drain placement and wound VAC 04/08/5535 further complicated by development of intra-abdominal fluid collection status post right lower quadrant drain placement 11/11/2019 per interventional radiology Dr. Kathlene Cote.  Continue prednisone 20 mg daily for Crohn's disease             -patient may not yet shower d/t abdominal wounds              -ELOS/Goals: min A in 10-14 days.  -Continue CIR 2.  Antithrombotics: -DVT/anticoagulation: Lovenox             -antiplatelet therapy: N/A 3. Pain Management: Neurontin 200 mg twice daily, Lidoderm patch as directed, Robaxin 1000 mg every 8 hours, tramadol as needed. Well controlled- changed tylenol to as needed 4. Mood: Lexapro 10 mg daily, melatonin 3 mg nightly  as needed             -antipsychotic agents: N/A  -will ask neuropsych to see to help with reactive depression/coping. She feels that lexapro has helped her. 5. Neuropsych: This patient is capable of making decisions on her own behalf. 6. Skin/Wound Care: continue local care to abdominal wounds  -JP's with decreasing tannish drainage. 7. Fluids/Electrolytes/Nutrition: encourage po with high protein given protein malnutrition  -I personally reviewed the patient's labs today.    -protein supp for low albumin  8.  ID/intra-abdominal abscess.  Currently maintained on Cipro 500 mg twice daily  -wbc's up to 11k today--afebrile  -continue to monitor 9.  Acute blood loss anemia.  Hgb 8.6 today. Follow-up CBC Thursday 10.  Hypotension.     -  Continue TED stockings  -abdominal binder doesn't do much given that it can't be wrapped tightly around drains/abd wound. Can wear if it helps with comfort.  - Daily orthostatic  vital signs ordered.   - Tramadol after therapy so it does not drop her BP as much   -8/24: midodrine trial when up with therapy 53m bid 11. Hypokalemia: 4.3  8/24    - supplement decreased to 222m daily   -f/u later this week     LOS: 2 days A FACE TO FACE EVALUATION WAS PERFORMED  KrClide Deutscheraulkar 11/25/2019, 8:31 AM

## 2019-11-26 ENCOUNTER — Inpatient Hospital Stay (HOSPITAL_COMMUNITY): Payer: 59 | Admitting: *Deleted

## 2019-11-26 ENCOUNTER — Inpatient Hospital Stay (HOSPITAL_COMMUNITY): Payer: 59

## 2019-11-26 ENCOUNTER — Inpatient Hospital Stay (HOSPITAL_COMMUNITY): Payer: 59 | Admitting: Physical Therapy

## 2019-11-26 ENCOUNTER — Inpatient Hospital Stay (HOSPITAL_COMMUNITY): Payer: 59 | Admitting: Occupational Therapy

## 2019-11-26 NOTE — Progress Notes (Addendum)
[place] PHYSICAL MEDICINE & REHABILITATION PROGRESS NOTE   Subjective/Complaints: Has belly pain at times. PRN tylenol effective. Appetite is great  ROS: Patient denies fever, rash, sore throat, blurred vision, nausea, vomiting, diarrhea, cough, shortness of breath or chest pain, joint or back pain, headache, or mood change.    Objective:   No results found. Recent Labs    11/24/19 0933  WBC 11.3*  HGB 10.2*  HCT 32.2*  PLT 429*   Recent Labs    11/24/19 0933  NA 131*  K 4.2  CL 97*  CO2 24  GLUCOSE 113*  BUN 5*  CREATININE 0.37*  CALCIUM 8.7*    Intake/Output Summary (Last 24 hours) at 11/26/2019 1027 Last data filed at 11/26/2019 0825 Gross per 24 hour  Intake 598 ml  Output 107.5 ml  Net 490.5 ml     Physical Exam: Vital Signs Blood pressure (!) 97/56, pulse 93, temperature 98.2 F (36.8 C), resp. rate 17, height 5' 1"  (1.549 m), weight 45.4 kg, last menstrual period 12/15/2014, SpO2 96 %. Constitutional: No distress . Vital signs reviewed. Still frail appearing HEENT: EOMI, oral membranes moist Neck: supple Cardiovascular: RRR without murmur. No JVD    Respiratory/Chest: CTA Bilaterally without wheezes or rales. Normal effort    GI/Abdomen: BS +,  tender, non-distended Ext: no clubbing, cyanosis, or edema Psych: pleasant and cooperative Skin: abdominal wound with pink granulation and retention sutures. JP drains with scant tan-colored, cloudy drainage. Ostomy sealed Neuro: Pt is cognitively appropriate with normal insight, memory, and awareness. Cranial nerves 2-12 are intact. Sensory exam is normal. Reflexes are 2+ in all 4's. Fine motor coordination is intact. No tremors. Motor function is grossly 4-/5 UE prox to 4/5 disally. LE: 2 to 2+/5 HF, 3/5 KE, 4-/5 ADF/PF.  Musculoskeletal: Full ROM, No pain with AROM or PROM in the neck, trunk, or extremities. Posture appropriate         Assessment/Plan: 1. Functional deficits secondary to debility which  require 3+ hours per day of interdisciplinary therapy in a comprehensive inpatient rehab setting.  Physiatrist is providing close team supervision and 24 hour management of active medical problems listed below.  Physiatrist and rehab team continue to assess barriers to discharge/monitor patient progress toward functional and medical goals  Care Tool:  Bathing    Body parts bathed by patient: Right arm, Left arm, Chest, Abdomen, Right upper leg, Left upper leg, Right lower leg, Left lower leg, Face   Body parts bathed by helper: Front perineal area, Buttocks     Bathing assist Assist Level: 2 Helpers     Upper Body Dressing/Undressing Upper body dressing   What is the patient wearing?: Pull over shirt    Upper body assist Assist Level: Supervision/Verbal cueing    Lower Body Dressing/Undressing Lower body dressing      What is the patient wearing?: Incontinence brief     Lower body assist Assist for lower body dressing: Minimal Assistance - Patient > 75%     Toileting Toileting    Toileting assist Assist for toileting: Moderate Assistance - Patient 50 - 74%     Transfers Chair/bed transfer  Transfers assist     Chair/bed transfer assist level: Supervision/Verbal cueing     Locomotion Ambulation   Ambulation assist   Ambulation activity did not occur: Safety/medical concerns  Assist level: Minimal Assistance - Patient > 75% Assistive device: Parallel bars Max distance: 2 ft   Walk 10 feet activity   Assist  Walk 10  feet activity did not occur: Safety/medical concerns        Walk 50 feet activity   Assist Walk 50 feet with 2 turns activity did not occur: Safety/medical concerns         Walk 150 feet activity   Assist Walk 150 feet activity did not occur: Safety/medical concerns         Walk 10 feet on uneven surface  activity   Assist Walk 10 feet on uneven surfaces activity did not occur: Safety/medical concerns          Wheelchair     Assist Will patient use wheelchair at discharge?: Yes Type of Wheelchair: Manual    Wheelchair assist level: Supervision/Verbal cueing Max wheelchair distance: 150 ft    Wheelchair 50 feet with 2 turns activity    Assist        Assist Level: Supervision/Verbal cueing   Wheelchair 150 feet activity     Assist      Assist Level: Supervision/Verbal cueing   Blood pressure (!) 97/56, pulse 93, temperature 98.2 F (36.8 C), resp. rate 17, height 5' 1"  (1.549 m), weight 45.4 kg, last menstrual period 12/15/2014, SpO2 96 %.  Medical Problem List and Plan: 1.  Debility secondary to Crohn's disease/diverticulitis status post exploratory laparotomy with Hartman's procedure 09/21/2977 complicated by fascial dehiscence mesenteric hematoma pelvic abscess and mucocutaneous separation of colostomy status post exploratory laparotomy colostomy revision drain placement and wound VAC 11/08/2117 further complicated by development of intra-abdominal fluid collection status post right lower quadrant drain placement 11/11/2019 per interventional radiology Dr. Kathlene Cote.                -patient may not yet shower d/t abdominal wounds, drains             -ELOS/Goals: min A in 10-14 days.  -Continue CIR 2.  Antithrombotics: -DVT/anticoagulation: Lovenox             -antiplatelet therapy: N/A 3. Pain Management: Neurontin 200 mg twice daily, Lidoderm patch as directed, Robaxin 1000 mg every 8 hours, tramadol as needed. Well controlled- changed tylenol to as needed 4. Mood: Lexapro 10 mg daily, melatonin 3 mg nightly as needed             -antipsychotic agents: N/A  - neuropsych to see to help with reactive depression/coping. She feels that lexapro has helped her. 5. Neuropsych: This patient is capable of making decisions on her own behalf. 6. Skin/Wound Care: continue local care to abdominal wounds  -JP's with decreasing tannish drainage. (5cc) 7.  Fluids/Electrolytes/Nutrition: encourage po with high protein given protein malnutrition  -eating well. 70-100% of meals, has vigorous appetite.    -protein supp for low albumin  8.  ID/intra-abdominal abscess.  Currently maintained on Cipro 500 mg twice daily  -wbc's up to 11k 8/24--recheck 8/27. Likely just steroid effect  -continue to monitor 9.  Acute blood loss anemia.  Hgb 8.6 today. Follow-up CBC Thursday 10.  Hypotension.     -  Continue TED stockings  -abdominal binder doesn't do much given that it can't be wrapped tightly around drains/abd wound. Can wear if it helps with comfort.  - Daily orthostatic vital signs ordered. Still drops with standing but better  - Tramadol after therapy so it does not drop her BP as much   -8/26: continuemidodrine trial when up with therapy 59m bid 11. Hypokalemia: 4.3  8/24    - supplement decreased to 276m daily   -f/u Friday 12. Crohn's disease:  continue prednisone 61m daily    LOS: 3 days A FACE TO FACE EVALUATION WAS PERFORMED  ZMeredith Staggers8/26/2021, 10:27 AM

## 2019-11-26 NOTE — Consult Note (Addendum)
Natasha Barrera ostomy follow up Patient receiving care in [facility] [phone]. Night shift changed pouching system at the end of their shift.  No new teaching needs identified.  Ostomy teaching has been completed.  Patient verbalized her confidence in her ability to perform ostomy self care.  I encouraged her to do as much of the care as needed in order to gain the experience she needs, and while she has staff to serve as resources to her.  She agreed to do so. I have enrolled her in the [employer] program. Of note, the flat, 2 piece 2 and 3/4 inch system with barrier ring placed on Monday had not started leaking, but in the opinion of the primary RN on nights, it had a high potential to do so, and was therefore changed.  It is the flat 2 pc system and barrier rings I am ordering for her today. Natasha Riles, RN, MSN, CWOCN, CNS-BC, pager [phone]

## 2019-11-26 NOTE — Progress Notes (Signed)
Occupational Therapy Session Note  Patient Details  Name: Natasha Barrera MRN: [id number] Date of Birth: 08/20/1965  Today's Date: 11/26/2019 OT Individual Time: 1300-1400 OT Individual Time Calculation (min): 60 min    Short Term Goals: Week 1:  OT Short Term Goal 1 (Week 1): Pt will complete LB bathing sit to stand with mod assist for two consecutive sessions. OT Short Term Goal 2 (Week 1): Pt will complete LB dressing with mod assist sit to stand for two consecutive sessions. OT Short Term Goal 3 (Week 1): Pt will complete toilet transfers stand pivot with use of the RW and mod assist. OT Short Term Goal 4 (Week 1): Pt will increase endurance by being able to stand at the sink with mod assist for greater than 2 mins during 1 grooming task.  Skilled Therapeutic Interventions/Progress Updates:    Patient in bed, just finished lunch, denies pain at this time and is eager to participate in therapy session.  Rolling in bed mod I, sidelying to sitting edge of bed with CS, good seated balance.  SB transfer bed to w/c with CGA and cues to clear buttocks.   Completed grooming, UB bathing and dressing w/c level with set up.   Completed standing frame 1 x 4 minutes and second attempt for 3 minutes - she is able to pull knees away from front guard and complete weight shift within support strap.  Completed UB 1# dowel exercises with focus on shoulder, tricep and forearm strength as well as trunk/pelvic tilt activity.   She is able to propel w/c 40 feet - stopping due to fatigue.  SB transfer back to bed with CS.  She is able to move from sitting to supine with CS.  Changed pants in supine position - able to doff pants independently and requires min A to donn clean pants.  She remained in bed at close of session, bed alarm set and call bell/tray table in reach.    Therapy Documentation Precautions:  Precautions Precautions: Fall, Other (comment) Precaution Comments: Jp drain x2 Right side / ostomy L  side Required Braces or Orthoses: Other Brace Other Brace: ted hose, abdominal binder is optional if pt wants it for comfort or not (cannot be wrapped tightly enough to assist with BP management) Restrictions Weight Bearing Restrictions: No   Therapy/Group: Individual Therapy  Carlos Levering 11/26/2019, 7:40 AM

## 2019-11-26 NOTE — Progress Notes (Signed)
Physical Therapy Session Note  Patient Details  Name: Natasha Barrera MRN: [id number] Date of Birth: [birth year]  Today's Date: 11/26/2019 PT Individual Time: 0830-0900 PT Individual Time Calculation (min): 30 min   Short Term Goals: Week 1:  PT Short Term Goal 1 (Week 1): Pt will complete bed<>w/c with mod assist +1 & LRAD. PT Short Term Goal 2 (Week 1): Pt will ambulate 25 ft with mod assist & LRAD. PT Short Term Goal 3 (Week 1): Pt will complete sit<>stand consistently, from all seat heights, with mod assist +1 & LRAD.  Skilled Therapeutic Interventions/Progress Updates:    pt received in bed and agreeable to therapy. PT donned TED hose with pt in supine, total A and B socks, pt directed in supine>sit with lod rolling technique min A, dizziness reported at EOB, relief reported after short rest break. Pt directed in slide board transfer to [facility], min A. Pt directed in [place] mobility 2x150' supervision with VC for arm technique for increased effectiveness. Pt directed in seated BLE strengthening exercises of marching, LAQ 2x15 with 2# on LAQ and body weight resistance for marching as pt unable to lift with weighted resistance, VC for technique. Pt returned to room supervision with WC mobility. Left in room All needs in reach and in good condition. Call light in hand.  Pt denied pain at start and end of session.   Therapy Documentation Precautions:  Precautions Precautions: Fall, Other (comment) Precaution Comments: Jp drain x2 Right side / ostomy L side Required Braces or Orthoses: Other Brace Other Brace: ted hose, abdominal binder is optional if pt wants it for comfort or not (cannot be wrapped tightly enough to assist with BP management) Restrictions Weight Bearing Restrictions: No    Therapy/Group: Individual Therapy  Junie Panning 11/26/2019, 9:01 AM

## 2019-11-26 NOTE — Progress Notes (Signed)
Physical Therapy Session Note  Patient Details  Name: Natasha Barrera MRN: [id number] Date of Birth: 07/04/1965  Today's Date: 11/26/2019 PT Individual Time: 0907-1000 PT Individual Time Calculation (min): 53 min   Short Term Goals: Week 1:  PT Short Term Goal 1 (Week 1): Pt will complete bed<>w/c with mod assist +1 & LRAD. PT Short Term Goal 2 (Week 1): Pt will ambulate 25 ft with mod assist & LRAD. PT Short Term Goal 3 (Week 1): Pt will complete sit<>stand consistently, from all seat heights, with mod assist +1 & LRAD.  Skilled Therapeutic Interventions/Progress Updates: Pt presents sitting in w/c and agreeable to therapy.  Pt negotiated w/c in room and into hallways up to 150' w/ verbal cues for simultaneous use of UEs and for turns.  Pt performed modified squat pivot w/c > Nu-step w/ min to mod 2/2 weak LEs.  Pt performed Nu-step x 4' at Level 2 ad emphasis on LEs, w/ noted fatigue of LEs, and then 1' at Level 2, before reducing to Level 1 for 6'.  Pt performed SB transfer Nu-step > w/c w/ supervision and verbal cues, but max A to position SB after pt leans.  Pt performed calf raises, LAQ, marching (AAROM to increase ROM) 3 x 15.  Pt negotiated w/c in hallways and back to room.  Chair alarm on and all needs in reach.     Therapy Documentation Precautions:  Precautions Precautions: Fall, Other (comment) Precaution Comments: Jp drain x2 Right side / ostomy L side Required Braces or Orthoses: Other Brace Other Brace: ted hose, abdominal binder is optional if pt wants it for comfort or not (cannot be wrapped tightly enough to assist with BP management) Restrictions Weight Bearing Restrictions: No General:   Vital Signs:  Pain: 0/10 pain pre-therapy and 5/10 post therapy, does not wish meds.        Therapy/Group: Individual Therapy  Ladoris Gene 11/26/2019, 10:04 AM

## 2019-11-26 NOTE — Evaluation (Signed)
Recreational Therapy Assessment and Plan  Patient Details  Name: Natasha Barrera MRN: [id number] Date of Birth: May 04, 1965 Today's Date: 11/26/2019  Rehab Potential: Good ELOS:   2 weeks Assessment  Hospital Problem: Principal Problem:   Debility   Past Medical History:      Past Medical History:  Diagnosis Date  . Diverticul disease small and large intestine, no perforati or abscess   . Diverticulitis of colon   . Hypertension    Past Surgical History:       Past Surgical History:  Procedure Laterality Date  . APPLICATION OF WOUND VAC N/A 11/04/2019   Procedure: APPLICATION OF WOUND VAC;  Surgeon: Donnie Mesa, MD;  Location: [place];  Service: General;  Laterality: N/A;  . BIOPSY  10/21/2019   Procedure: BIOPSY;  Surgeon: Irene Shipper, MD;  Location: [facility] ENDOSCOPY;  Service: Endoscopy;;  . COLON SURGERY  12/25/2011  . COLONOSCOPY    . COLONOSCOPY WITH PROPOFOL N/A 10/21/2019   Procedure: COLONOSCOPY WITH PROPOFOL;  Surgeon: Irene Shipper, MD;  Location: [facility] ENDOSCOPY;  Service: Endoscopy;  Laterality: N/A;  . COLOSTOMY Left 10/24/2019   Procedure: COLOSTOMY;  Surgeon: Clovis Riley, MD;  Location: [place];  Service: General;  Laterality: Left;  . ESOPHAGOGASTRODUODENOSCOPY (EGD) WITH PROPOFOL N/A 10/21/2019   Procedure: ESOPHAGOGASTRODUODENOSCOPY (EGD) WITH PROPOFOL;  Surgeon: Irene Shipper, MD;  Location: [facility] ENDOSCOPY;  Service: Endoscopy;  Laterality: N/A;  with small bowel BX's  . [place]  2002  . LAPAROTOMY N/A 10/24/2019   Procedure: EXPLORATORY LAPAROTOMY;  Surgeon: Clovis Riley, MD;  Location: [place];  Service: General;  Laterality: N/A;  . LAPAROTOMY N/A 11/04/2019   Procedure: EXPLORATORY LAPAROTOMY FOR DEHISCENCE;  Surgeon: Donnie Mesa, MD;  Location: [place];  Service: General;  Laterality: N/A;  . VENTRAL HERNIA REPAIR N/A 12/05/2018   Procedure: LAPAROSCOPIC VENTRAL HERNIA REPAIR WITH MESH;  Surgeon: Jovita Kussmaul, MD;  Location: [facility]  OR;  Service: General;  Laterality: N/A;    Assessment & Plan Clinical Impression: Patient is a 54 y.o. year old female with history of hypertension, diverticulitis status post sigmoid colectomy 2013, ventral hernia repair, tobacco use. Patient with recent admission 10/02/2019 to 10/04/2019 for complaints of abdominal discomfort diarrhea and weight loss found to be hypotensive with electrolyte derangements. CT abdomen pelvis at that time revealed diffuse colitis involving the ascending, transverse and descending colon. GI was consulted recommendations inpatient colostomy and stool studies. Patient and her husband refused to stay for complete work-up and left AMA. Since her discharge of 10/04/2019 continued experiencing worsening fatigue poor appetite loose stools as well as some swelling lower extremities. Patient presented back 10/18/2019 with increasing abdominal pain. Blood pressure 97/65 hemoglobin 10.4 platelet 593 sodium 123 chloride 86 potassium 3.5 total protein 4.4, total bilirubin 1.3. CTA unremarkable. GI services consulted Dr. Henrene Pastor underwent colonoscopy/upper GI endoscopy 10/21/2019 that showed the terminal ileum appeared normal. There was scarring and mucosal bridging as well as scattered areas of ulceration throughout the entire colon. There was evidence of prior sigmoid colectomy and a few diverticula. EGD was unremarkable. Findings favored Crohn's colitis. 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 And underwent exploratory laparotomy lysis of adhesions segmental colectomy with end colostomy  10/24/2019 per Dr. Kae Heller. Postoperative course complicated by hypotension with  follow-up for critical care maintained on IV hydration as well as Levophed. She did require IV heparin for thrombosis and she did develop subsequent large mesenteric hematoma 10/30/2019 and IV heparin discontinued. On 11/04/2019 she had facial dehiscence with visible transverse colon underwent exploratory laparotomy colostomy revision, fascial closure, placement of 4 retention sutures and wound VAC along with right JP drain placement. As of 11/11/2019 findings of right abdominal abscess status post IR drain per Dr.Yamagata As noted cultures Klebsiella and Pseudomonas and initially maintained on Zosyn switched to Cipro 11/18/2019. Latest CT of the abdomen 11/21/2019 showed complete resolution of right mid abdominal abscess after pigtail drainage catheter placement. No recurrence or residual fluid collection. Patient undergoing twice daily wet-to-dry dressing changes. She was cleared to begin subcutaneous Lovenox for DVT prophylaxis. Patient with slow progressive gains acute blood loss anemia latest hemoglobin 8.4, sodium 135. Therapy evaluations completed and patient was admitted for a comprehensive rehab program.  Patient transferred to CIR on 11/23/2019 .   Pt presents with decreased activity tolerance, decreased functional mobility, decreased balance Limiting pt's independence with leisure/community pursuits.   Plan  Min 1 TR session >20 minutes per week during LOS  Recommendations for other services: None   Discharge Criteria: Patient will be discharged from TR if patient refuses treatment 3 consecutive times without medical reason.  If treatment goals not met, if there is a change in medical status, if patient makes no progress towards goals or if patient is discharged from hospital.  The above assessment, treatment plan, treatment alternatives and goals were discussed and mutually agreed upon: by patient  [place] 11/26/2019, 4:14 PM

## 2019-11-26 NOTE — Progress Notes (Signed)
Referring Physician(s): Dr. Donne Hazel  Supervising Physician: Corrie Mckusick  Patient Status:  [facility] - In-pt  Chief Complaint: Colon perforation  Subjective: Patient has transferred to rehab.  Drain in place. No complaints related to drain.  Aware of plan for drains to remain in place for now.   Allergies: Patient has no known allergies.  Medications: Prior to Admission medications   Medication Sig Start Date End Date Taking? Authorizing Provider  Calcium Carb-Cholecalciferol (CALCIUM+D3 PO) Take 1 tablet by mouth daily.   Yes [provider]  Cholecalciferol (VITAMIN D-3) 125 MCG (5000 UT) TABS Take 5,000 Units by mouth daily.   Yes [provider]  loperamide (IMODIUM A-D) 2 MG tablet Take 1 tablet (2 mg total) by mouth 4 (four) times daily as needed for diarrhea or loose stools. 09/20/19  Yes Maudie Mercury, MD  Multiple Vitamin (MULTIVITAMIN WITH MINERALS) TABS tablet Take 1 tablet by mouth daily. Centrum Silver   Yes [provider]     Vital Signs: BP (!) 97/56 (BP Location: Left Arm)   Pulse 93   Temp 98.2 F (36.8 C)   Resp 17   Ht 5' 1"  (1.549 m)   Wt 100 lb 1.4 oz (45.4 kg)   LMP 12/15/2014   SpO2 96%   BMI 18.91 kg/m   Physical Exam Vitals and nursing note reviewed.   NAD, alert Abdomen: Midline wound with double red rubber catheters in place. Right mid surgical drain in place. Posterior IR drain in place cloudy, yellow output.  Appears to thinning. Output decreasing over the past several days. Insertion site c/d/i.  Flushes easily, slowly aspirates.   Imaging: No results found.  Labs:  CBC: Recent Labs    11/21/19 0500 11/22/19 0500 11/23/19 0712 11/24/19 0933  WBC 7.6 6.9 8.0 11.3*  HGB 8.5* 8.6* 8.6* 10.2*  HCT 27.5* 28.4* 27.5* 32.2*  PLT 457* 410* 414* 429*    COAGS: Recent Labs    10/31/19 1721 11/06/19 0725  INR 1.2 1.1  APTT  --  32    BMP: Recent Labs    11/21/19 0500 11/22/19 0500  11/23/19 0712 11/24/19 0933  NA 135 133* 134* 131*  K 3.5 3.5 4.3 4.2  CL 102 100 102 97*  CO2 25 24 25 24   GLUCOSE 80 83 89 113*  BUN <5* <5* 5* 5*  CALCIUM 8.3* 7.9* 8.3* 8.7*  CREATININE 0.37* 0.34* 0.31* 0.37*  GFRNONAA >60 >60 >60 >60  GFRAA >60 >60 >60 >60    LIVER FUNCTION TESTS: Recent Labs    11/05/19 0445 11/10/19 0415 11/19/19 0500 11/24/19 0933  BILITOT 0.7 0.6 0.3 0.5  AST 21 11* 10* 19  ALT 127* 23 11 15   ALKPHOS 126 112 76 104  PROT 4.4* 4.6* 4.8* 5.8*  ALBUMIN 1.9* 1.7* 1.5* 2.1*    Assessment and Plan: History of diverticulitis s/p sigmoid colectomy in 2013 who presented with diffuse colitis with large perforation in the region of prior rectosigmoid anastomosis s/p exploratory laparotomy with Harthman's procedure in OR 10/24/2019 by Dr. Kae Heller; complicated by fascial dehiscence, mesenteric hematoma, pelvic abscess, and mucocutaneous separation of colostomy s/p exploratory laparotomy for colostomy revision with drain placement x1 and wound vac placement in OR 11/04/2019 by Dr. Georgette Dover; further complicated by development of intra-abdominal fluid collection s/p RLQ drain placement in IR 11/11/2019 by Dr. Kathlene Cote Patient reclining in bed.  Both right-sided drains remain in place. IR drain assessed.  Insertion site c/d/i. Flushes easily.  Output is slowing  thinning and decreasing.  Discussed care plan with surgery. They would like to keep both drains in place for now.  Surgical follow-up scheduled for mid-September.  Will place outpatient follow-up with IR around the same time for drain injection only.  No CT needed per Dr. Earleen Newport.   Continue routine drain care with regular daily flushes.    Electronically Signed: Docia Barrier, PA 11/26/2019, 11:47 AM   I spent a total of 15 Minutes at the the patient's bedside AND on the patient's hospital floor or unit, greater than 50% of which was counseling/coordinating care for colon perforation.

## 2019-11-26 NOTE — Progress Notes (Signed)
Physical Therapy Session Note  Patient Details  Name: Natasha Barrera MRN: [id number] Date of Birth: July 25, 1965  Today's Date: 11/26/2019 PT Individual Time: 1045-1200 PT Individual Time Calculation (min): 75 min   Short Term Goals: Week 1:  PT Short Term Goal 1 (Week 1): Pt will complete bed<>w/c with mod assist +1 & LRAD. PT Short Term Goal 2 (Week 1): Pt will ambulate 25 ft with mod assist & LRAD. PT Short Term Goal 3 (Week 1): Pt will complete sit<>stand consistently, from all seat heights, with mod assist +1 & LRAD. Week 2:    Week 3:     Skilled Therapeutic Interventions/Progress Updates:    PAIN  4/10 stomach, rest provided as needed.  Pt initially oob in wc.   wc propulsion x 138f mod I to gym.  Parallel bars: woked on STS from wc using bars for assist and cues for ant wt shift/momentum, achieved STS w/max assist, repeated x 2  Gait in parallel bars 669fw/wc follow, close guarding of knees, very short step length, instability at hips/knees notable.   wc to mat w/cues for set up/safe board placement.  SBT w/supervision and set up. Slightly elevated mat, attempted STS w/max assist and buckling at knees/ mas assist to safetly return to sitting  Mat further elevated and pt STS from mat w/min assist to RW, gait 12 ft w/min assist, very careful guarding due to signifcant instability at knees/hips, wc very closely following.  Pt rested x 6-7 min then repeated SBT and gait as above x 1436fBrief noted to be falling off. wc propulsion mod I back to room approx [id number] in stedy w/heavy mod assist of 1 from wc. Pt stood in stedy x 5 min while therapist removed wet brief, performed perineal hygiene, and donned clean brief/raised pants. Pt rested in semi sitting for several min then performed the following therex: STS w/min assist. Standing quad and glut sets x 10 w/3 sec hold Rested in semisitting x 2-3 min Repeated semistand to full stand x 3 reps w/min assist for  transition. Stand to sit on edge of bed w/min assist for controlled lowering.  Sit to supine w/min assist for LEs. Pt able to scoot in supine w/bed in slight trendelenberg using bedrails. Pt repositioned comfortably, Pt left supine w/rails up x 4, alarm set, bed in lowest position, and needs in reach.  Pt making good functional progress, but very high fall risk w/gait training due to signficant weakness at hips and knees.  Needs +2 assist, very careful guarding, close wc follow.    Therapy Documentation Precautions:  Precautions Precautions: Fall, Other (comment) Precaution Comments: Jp drain x2 Right side / ostomy L side Required Braces or Orthoses: Other Brace Other Brace: ted hose, abdominal binder is optional if pt wants it for comfort or not (cannot be wrapped tightly enough to assist with BP management) Restrictions Weight Bearing Restrictions: No    Therapy/Group: Individual Therapy  BarbCallie Fielding  [place]6/2021, 12:21 PM

## 2019-11-26 NOTE — IPOC Note (Signed)
Overall Plan of Care [facility]) Patient Details Name: Natasha Barrera MRN: [id number] DOB: [birth year]  Admitting Diagnosis: Debility  Hospital Problems: Principal Problem:   Debility Active Problems:   Protein-calorie malnutrition, severe ([place])     Functional Problem List: Nursing Bladder, Bowel, Skin Integrity  PT Balance, Pain, Safety, Endurance, Motor  OT Balance, Endurance, Pain  SLP    TR         Basic ADL's: OT Grooming, Bathing, Dressing, Toileting     Advanced  ADL's: OT Simple Meal Preparation     Transfers: PT Bed Mobility, Bed to Chair, Car, Manufacturing systems engineer, Metallurgist: PT Stairs, Emergency planning/management officer, Ambulation     Additional Impairments: OT None  SLP        TR      Anticipated Outcomes Item Anticipated Outcome  Self Feeding independent  Swallowing      Basic self-care  supervision  Toileting  supervision   Bathroom Transfers supervision  Bowel/Bladder  Continent of urine, participate in ostomy care  Transfers  min assist with LRAD  Locomotion  min assist household ambulation  Communication     Cognition     Pain  Pain less than 3  Safety/Judgment  cues/reminders   Therapy Plan: PT Intensity: Minimum of 1-2 x/day ,45 to 90 minutes PT Frequency: 5 out of 7 days PT Duration Estimated Length of Stay: 2 weeks OT Intensity: Minimum of 1-2 x/day, 45 to 90 minutes OT Frequency: 5 out of 7 days OT Duration/Estimated Length of Stay: 12-14 days     Due to the current state of emergency, patients may not be receiving their 3-hours of Medicare-mandated therapy.   Team Interventions: Nursing Interventions Patient/Family Education, Bladder Management, Bowel Management, Pain Management, Skin Care/Wound Management, Discharge Planning  PT interventions Ambulation/gait training, Discharge planning, DME/adaptive equipment instruction, Functional mobility training, Pain management, Psychosocial support, Therapeutic Activities,  UE/LE Strength taining/ROM, Wheelchair propulsion/positioning, UE/LE Coordination activities, Therapeutic Exercise, Stair training, Skin care/wound management, Patient/family education, Neuromuscular re-education, Disease management/prevention, Academic librarian, Training and development officer  OT Interventions Training and development officer, Academic librarian, Discharge planning, Engineer, drilling, Neuromuscular re-education, UE/LE Strength taining/ROM, Wheelchair propulsion/positioning, Therapeutic Exercise, Patient/family education, Functional mobility training, Pain management, Self Care/advanced ADL retraining, Therapeutic Activities, UE/LE Coordination activities  SLP Interventions    TR Interventions    SW/CM Interventions Psychosocial Support, Patient/Family Education, Discharge Planning   Barriers to Discharge MD  Medical stability  Nursing Wound Care    PT Wound Care new ostomy  OT      SLP      SW Decreased caregiver support, Lack of/limited family support     Team Discharge Planning: Destination: PT-Home ,OT- Home , SLP-  Projected Follow-up: PT-Home health PT, 24 hour supervision/assistance, OT-  Home health OT, 24 hour supervision/assistance, SLP-  Projected Equipment Needs: PT- , OT- To be determined, SLP-  Equipment Details: PT-RW, transport w/c, OT-  Patient/family involved in discharge planning: PT- Patient,  OT-Patient, SLP-   MD ELOS: 12-14 days Medical Rehab Prognosis:  Excellent Assessment: The patient has been admitted for CIR therapies with the diagnosis of debility after chron's disease/colitis/ex lap and multiple complications. The team will be addressing functional mobility, strength, stamina, balance, safety, adaptive techniques and equipment, self-care, bowel and bladder mgt, patient and caregiver education, NMR, wound care, pain mgt, ego support. Goals have been set at supervision to min assist with basic mobility and self-care.   Due  to the current state of emergency, patients may  not be receiving their 3 hours per day of Medicare-mandated therapy.    Meredith Staggers, MD, FAAPMR      See Team Conference Notes for weekly updates to the plan of care

## 2019-11-27 ENCOUNTER — Inpatient Hospital Stay (HOSPITAL_COMMUNITY): Payer: 59 | Admitting: Occupational Therapy

## 2019-11-27 ENCOUNTER — Inpatient Hospital Stay (HOSPITAL_COMMUNITY): Payer: 59

## 2019-11-27 ENCOUNTER — Inpatient Hospital Stay (HOSPITAL_COMMUNITY): Payer: 59 | Admitting: Physical Therapy

## 2019-11-27 LAB — BASIC METABOLIC PANEL
Anion gap: 8 (ref 5–15)
BUN: 9 mg/dL (ref 6–20)
CO2: 28 mmol/L (ref 22–32)
Calcium: 8.7 mg/dL — ABNORMAL LOW (ref 8.9–10.3)
Chloride: 100 mmol/L (ref 98–111)
Creatinine, Ser: 0.3 mg/dL — ABNORMAL LOW (ref 0.44–1.00)
GFR calc Af Amer: >60 mL/min (ref >60)
GFR calc non Af Amer: >60 mL/min (ref >60)
Glucose, Bld: 85 mg/dL (ref 70–99)
Potassium: 4 mmol/L (ref 3.5–5.1)
Sodium: 136 mmol/L (ref 135–145)

## 2019-11-27 LAB — CBC
HCT: 28.4 % — ABNORMAL LOW (ref 36.0–46.0)
Hemoglobin: 8.7 g/dL — ABNORMAL LOW (ref 12.0–15.0)
MCH: 31.6 pg (ref 26.0–34.0)
MCHC: 30.6 g/dL (ref 30.0–36.0)
MCV: 103.3 fL — ABNORMAL HIGH (ref 80.0–100.0)
Platelets: 375 10*3/uL (ref 150–400)
RBC: 2.75 MIL/uL — ABNORMAL LOW (ref 3.87–5.11)
RDW: 19.1 % — ABNORMAL HIGH (ref 11.5–15.5)
WBC: 7.9 10*3/uL (ref 4.0–10.5)
nRBC: 0 % (ref 0.0–0.2)

## 2019-11-27 NOTE — Progress Notes (Signed)
Physical Therapy Session Note  Patient Details  Name: Natasha Barrera MRN: [id number] Date of Birth: 03-20-1966  Today's Date: 11/27/2019 PT Individual Time: 1104-1200 PT Individual Time Calculation (min): 56 min   Short Term Goals: Week 1:  PT Short Term Goal 1 (Week 1): Pt will complete bed<>w/c with mod assist +1 & LRAD. PT Short Term Goal 2 (Week 1): Pt will ambulate 25 ft with mod assist & LRAD. PT Short Term Goal 3 (Week 1): Pt will complete sit<>stand consistently, from all seat heights, with mod assist +1 & LRAD.  Skilled Therapeutic Interventions/Progress Updates:  Pt received in w/c & agreeable to tx. Pt reports today is a "weird" day & she's just really tired, PT encouraged pt to rest PRN. Pt propels w/c room>gym, dayroom>room with BUE & supervision. Sit<>stand from w/c with max assist with cuing for hand placement. Pt ambulates 30 ft with RW & min assist + w/c follow for safety with pt demonstrating step to pattern leading with LLE & significantly decreased gait speed with pt reporting she is relying heavily on BUE on RW. Pt stood with RW & min assist while engaging in wii bowling with focus on standing tolerance & balance with pt only able to stand 1-1.5 minutes at a time (x 2 trials) with seated rest break in between. Pt then performs BLE long arc quads with 2.5# ankle weights (unable to achieve full knee extension ROM), seated marches without weights with instructional cuing for technique & task focusing on BLE strengthening, all 3 sets x 10 reps. Back in room, pt transfers sit>stand with max assist, pivot to bed with RW & min assist. Pt is able to transfer sit>supine with cuing for log rolling, supervision, and using BUE to assist LE onto bed. Pt performs bridging with PT stabilizing BLE but pt still unable to clear buttocks from bed. Pt left in bed with alarm set & call bell, all needs in reach.   Recreational therapist present for first half of session.  Therapy  Documentation Precautions:  Precautions Precautions: Fall, Other (comment) Precaution Comments: Jp drain x2 Right side / ostomy L side Required Braces or Orthoses: Other Brace Other Brace: ted hose, abdominal binder is optional if pt wants it for comfort or not (cannot be wrapped tightly enough to assist with BP management) Restrictions Weight Bearing Restrictions: No  Pain: Pt reports unrated generalized total body soreness & soreness at ostomy site, rest breaks provided PRN   Therapy/Group: Individual Therapy  [name] 11/27/2019, 12:21 PM

## 2019-11-27 NOTE — Progress Notes (Signed)
[place] PHYSICAL MEDICINE & REHABILITATION PROGRESS NOTE   Subjective/Complaints: Has some abdominal pain. Well controlled with Tylenol. Hgb dropped to 8.7 Na 136 which is much better Likes the room warm.    ROS: Patient denies fever, rash, sore throat, blurred vision, nausea, vomiting, diarrhea, cough, shortness of breath or chest pain, joint or back pain, headache, or mood change.    Objective:   No results found. Recent Labs    11/24/19 0933 11/27/19 0615  WBC 11.3* 7.9  HGB 10.2* 8.7*  HCT 32.2* 28.4*  PLT 429* 375   Recent Labs    11/24/19 0933 11/27/19 0615  NA 131* 136  K 4.2 4.0  CL 97* 100  CO2 24 28  GLUCOSE 113* 85  BUN 5* 9  CREATININE 0.37* 0.30*  CALCIUM 8.7* 8.7*    Intake/Output Summary (Last 24 hours) at 11/27/2019 0851 Last data filed at 11/27/2019 0742 Gross per 24 hour  Intake 672 ml  Output 150 ml  Net 522 ml     Physical Exam: Vital Signs Blood pressure (!) 102/59, pulse 79, temperature 98.3 F (36.8 C), resp. rate 16, height 5' 1"  (1.549 m), weight 45.6 kg, last menstrual period 12/15/2014, SpO2 96 %. General: Alert and oriented x 3, No apparent distress HEENT: Head is normocephalic, atraumatic, PERRLA, EOMI, sclera anicteric, oral mucosa pink and moist, dentition intact, ext ear canals clear,  Neck: Supple without JVD or lymphadenopathy Heart: Reg rate and rhythm. No murmurs rubs or gallops Chest: CTA bilaterally without wheezes, rales, or rhonchi; no distress Abdomen: Soft, non-tender, non-distended, bowel sounds positive. Extremities: No clubbing, cyanosis, or edema. Pulses are 2+ Psych: pleasant and cooperative Skin: abdominal wound with pink granulation and retention sutures. JP drains with scant tan-colored, cloudy drainage. Ostomy sealed Neuro: Pt is cognitively appropriate with normal insight, memory, and awareness. Cranial nerves 2-12 are intact. Sensory exam is normal. Reflexes are 2+ in all 4's. Fine motor coordination is  intact. No tremors. Motor function is grossly 4-/5 UE prox to 4/5 disally. LE: 2 to 2+/5 HF, 3/5 KE, 4-/5 ADF/PF.  Musculoskeletal: Full ROM, No pain with AROM or PROM in the neck, trunk, or extremities. Posture appropriate  Assessment/Plan: 1. Functional deficits secondary to debility which require 3+ hours per day of interdisciplinary therapy in a comprehensive inpatient rehab setting.  Physiatrist is providing close team supervision and 24 hour management of active medical problems listed below.  Physiatrist and rehab team continue to assess barriers to discharge/monitor patient progress toward functional and medical goals  Care Tool:  Bathing    Body parts bathed by patient: Right arm, Left arm, Chest, Abdomen, Right upper leg, Left upper leg, Right lower leg, Left lower leg, Face   Body parts bathed by helper: Front perineal area, Buttocks     Bathing assist Assist Level: 2 Helpers     Upper Body Dressing/Undressing Upper body dressing   What is the patient wearing?: Pull over shirt    Upper body assist Assist Level: Supervision/Verbal cueing    Lower Body Dressing/Undressing Lower body dressing      What is the patient wearing?: Pants     Lower body assist Assist for lower body dressing: Minimal Assistance - Patient > 75%     Toileting Toileting    Toileting assist Assist for toileting: Moderate Assistance - Patient 50 - 74%     Transfers Chair/bed transfer  Transfers assist     Chair/bed transfer assist level: Supervision/Verbal cueing (SBT)  Locomotion Ambulation   Ambulation assist   Ambulation activity did not occur: Safety/medical concerns  Assist level: 2 helpers Assistive device: Walker-rolling Max distance: 14   Walk 10 feet activity   Assist  Walk 10 feet activity did not occur: Safety/medical concerns  Assist level: 2 helpers Assistive device: Walker-rolling   Walk 50 feet activity   Assist Walk 50 feet with 2 turns  activity did not occur: Safety/medical concerns         Walk 150 feet activity   Assist Walk 150 feet activity did not occur: Safety/medical concerns         Walk 10 feet on uneven surface  activity   Assist Walk 10 feet on uneven surfaces activity did not occur: Safety/medical concerns         Wheelchair     Assist Will patient use wheelchair at discharge?: Yes Type of Wheelchair: Manual    Wheelchair assist level: Supervision/Verbal cueing Max wheelchair distance: 150 ft    Wheelchair 50 feet with 2 turns activity    Assist        Assist Level: Supervision/Verbal cueing   Wheelchair 150 feet activity     Assist      Assist Level: Supervision/Verbal cueing   Blood pressure (!) 102/59, pulse 79, temperature 98.3 F (36.8 C), resp. rate 16, height 5' 1"  (1.549 m), weight 45.6 kg, last menstrual period 12/15/2014, SpO2 96 %.  Medical Problem List and Plan: 1.  Debility secondary to Crohn's disease/diverticulitis status post exploratory laparotomy with Hartman's procedure 4/33/2951 complicated by fascial dehiscence mesenteric hematoma pelvic abscess and mucocutaneous separation of colostomy status post exploratory laparotomy colostomy revision drain placement and wound VAC 11/07/4164 further complicated by development of intra-abdominal fluid collection status post right lower quadrant drain placement 11/11/2019 per interventional radiology Dr. Kathlene Cote.                -patient may not yet shower d/t abdominal wounds, drains             -ELOS/Goals: min A in 10-14 days.  -Continue CIR 2.  Antithrombotics: -DVT/anticoagulation: Lovenox             -antiplatelet therapy: N/A 3. Pain Management: Neurontin 200 mg twice daily, Lidoderm patch as directed, Robaxin 1000 mg every 8 hours, tramadol as needed. Well controlled- changed tylenol to as needed.  4. Mood: Lexapro 10 mg daily, melatonin 3 mg nightly as needed             -antipsychotic agents: N/A  -  neuropsych to see to help with reactive depression/coping. She feels that lexapro has helped her. 5. Neuropsych: This patient is capable of making decisions on her own behalf. 6. Skin/Wound Care: continue local care to abdominal wounds  -JP's with decreasing tannish drainage. (5cc) 7. Fluids/Electrolytes/Nutrition: encourage po with high protein given protein malnutrition  -eating well. 70-100% of meals, has vigorous appetite.    -protein supp for low albumin  8.  ID/intra-abdominal abscess.  Currently maintained on Cipro 500 mg twice daily  -wbc's up to 11k 8/24--normal on 8/27  -continue to monitor 9.  Acute blood loss anemia.  Hgb stable on 8/27.  10.  Hypotension.     -  Continue TED stockings  -abdominal binder doesn't do much given that it can't be wrapped tightly around drains/abd wound. Can wear if it helps with comfort.  - Daily orthostatic vital signs ordered. Still drops with standing but better  - Tramadol after therapy so it does  not drop her BP as much   -8/26: continuemidodrine trial when up with therapy 28m bid 11. Hypokalemia: 4.3  8/24    - supplement decreased to 262m daily   -f/u Friday 12. Crohn's disease: continue prednisone 2051maily    LOS: 4 days A FACE TO FACE EVALUATION WAS PERFORMED  Natasha Barrera 11/27/2019, 8:51 AM

## 2019-11-27 NOTE — Progress Notes (Signed)
Occupational Therapy Session Note  Patient Details  Name: Natasha Barrera MRN: [id number] Date of Birth: February 01, 1966  Today's Date: 11/27/2019 OT Individual Time: 0902-1000 OT Individual Time Calculation (min): 58 min    Short Term Goals: Week 1:  OT Short Term Goal 1 (Week 1): Pt will complete LB bathing sit to stand with mod assist for two consecutive sessions. OT Short Term Goal 2 (Week 1): Pt will complete LB dressing with mod assist sit to stand for two consecutive sessions. OT Short Term Goal 3 (Week 1): Pt will complete toilet transfers stand pivot with use of the RW and mod assist. OT Short Term Goal 4 (Week 1): Pt will increase endurance by being able to stand at the sink with mod assist for greater than 2 mins during 1 grooming task.  Skilled Therapeutic Interventions/Progress Updates:    Pt pleasant and ready for OT session.  She was able to transfer to the EOB with supervision to start session.  She then completed sliding board transfer to the wheelchair with min guard assist once the board was placed.  UB bathing and grooming tasks were completed at the sink with setup assist.  UB dressing was also performed at a supervision level.  She was able to wash and dress her LEs in sitting with setup except for TEDs, which required max assist.  Max assist was then needed for sit to stand to pull items over her hips.  Next, had her propel her wheelchair down to the therapy gym with supervision.  Worked on standing in the parallel bars with BUE support.  She needed max assist for initial sit to stand but then could maintain standing with UE support and min assist for simple marches in place.  Mod to max for small squat to stand transitions 3-5 reps for 3 sets.  Transitioned her down to the ortho gym where she completed 5 mins of BUE strengthening on the ergonometer at resistance level 5 and RPMs at 22-25.  Finished session with return to the room and pt left sitting up in the wheelchair with the  call button and phone in reach and safety belt in place.    Therapy Documentation Precautions:  Precautions Precautions: Fall, Other (comment) Precaution Comments: Jp drain x2 Right side / ostomy L side Required Braces or Orthoses: Other Brace Other Brace: ted hose, abdominal binder is optional if pt wants it for comfort or not (cannot be wrapped tightly enough to assist with BP management) Restrictions Weight Bearing Restrictions: No  Pain: Pain Assessment Pain Scale: Faces Pain Score: 0-No pain ADL: See Care Tool Section for some details of mobility and selfcare  Therapy/Group: Individual Therapy  Gorman Safi OTR/L 11/27/2019, 12:08 PM

## 2019-11-27 NOTE — Progress Notes (Signed)
Recreational Therapy Session Note  Patient Details  Name: Natasha Barrera MRN: [id number] Date of Birth: 1965/04/03 Today's Date: 11/27/2019  Pain: no c/o Skilled Therapeutic Interventions/Progress Updates: Session focused on activity tolerance, dynamic balance during co-treat with PT.  Pt stated feeling fatigued today more so than her usual but agreeable to participate in therapy session.  Pt stood with RW and min assist, verbal cuing for hand placement to play Wii bowling with frequent rest breaks due to fatigue.  Pt tolerated standing ~1.5 minutes with seated rest breaks in between.  Therapy/Group: Co-Treatment Curt Oatis 11/27/2019, 12:19 PM

## 2019-11-27 NOTE — Progress Notes (Signed)
Occupational Therapy Session Note  Patient Details  Name: MAKEILA YAMAGUCHI MRN: [id number] Date of Birth: 01-07-1966  Today's Date: 11/27/2019 OT Individual Time: 1300-1410 OT Individual Time Calculation (min): 70 min    Short Term Goals: Week 1:  OT Short Term Goal 1 (Week 1): Pt will complete LB bathing sit to stand with mod assist for two consecutive sessions. OT Short Term Goal 2 (Week 1): Pt will complete LB dressing with mod assist sit to stand for two consecutive sessions. OT Short Term Goal 3 (Week 1): Pt will complete toilet transfers stand pivot with use of the RW and mod assist. OT Short Term Goal 4 (Week 1): Pt will increase endurance by being able to stand at the sink with mod assist for greater than 2 mins during 1 grooming task.  Skilled Therapeutic Interventions/Progress Updates:    1;1. Pt received in bed with NT present taking BP. Vitals 94/56. therex in bed to increase BP in prep for OOB mobility.  UB therex as follows 2x15 with 2 # dowel rod: Shoulder press Chest press Shoulder flexion/ext Elbow flexion extension BP 100/63 after therex BP 110/65 after CGA SB transfer OOB to/from w/c  Pt completes w/c propulsion inhallway with S for BUE endurance and strengthening. Pt declines standing this date d/t fatigue and low mood. Pt completes 3x30 ball toss with 1.5# wrist weights on BUE for BUE strengthening/endurnace of shoulders/triceps via bounce, chest and overhead pass. Exited sessionw iht pt seated in bed, exit alarm on an dclal lightin reach   Therapy Documentation Precautions:  Precautions Precautions: Fall, Other (comment) Precaution Comments: Jp drain x2 Right side / ostomy L side Required Braces or Orthoses: Other Brace Other Brace: ted hose, abdominal binder is optional if pt wants it for comfort or not (cannot be wrapped tightly enough to assist with BP management) Restrictions Weight Bearing Restrictions: No General:   Vital Signs: Therapy Vitals BP:  (!) 94/57 Pain: Pain Assessment Pain Scale: Faces Pain Score: 0-No pain ADL: ADL Eating: Independent Where Assessed-Eating: Wheelchair Grooming: Setup Where Assessed-Grooming: Wheelchair Upper Body Bathing: Supervision/safety Where Assessed-Upper Body Bathing: Wheelchair Lower Body Bathing: Dependent Where Assessed-Lower Body Bathing: Wheelchair, Sitting at sink, Standing at sink Upper Body Dressing: Supervision/safety Where Assessed-Upper Body Dressing: Wheelchair Lower Body Dressing: Dependent Where Assessed-Lower Body Dressing: Wheelchair Toileting: Dependent Where Assessed-Toileting: Recruitment consultant Transfer: Dependent Armed forces technical officer Method: Arts development officer: Radiographer, therapeutic: Not assessed Social research officer, government: Not assessed Vision   Perception    Praxis   Exercises:   Other Treatments:     Therapy/Group: Individual Therapy  Tonny Branch 11/27/2019, 1:11 PM

## 2019-11-27 NOTE — Progress Notes (Signed)
pts ostomy bag soiled the midline incision. Ostomy was clean and prepped, new colostomy bag in place. Midline incision had small amount of yellow drainage. Wet-Dry gauze dressing with ABD pads over. Pt tolerated the procedure, but did c/o pain,

## 2019-11-28 ENCOUNTER — Inpatient Hospital Stay (HOSPITAL_COMMUNITY): Payer: 59 | Admitting: Occupational Therapy

## 2019-11-28 ENCOUNTER — Inpatient Hospital Stay (HOSPITAL_COMMUNITY): Payer: 59 | Admitting: Physical Therapy

## 2019-11-28 MED ORDER — ONDANSETRON HCL 4 MG PO TABS: 4.0000 mg | ORAL_TABLET | Freq: Three times a day (TID) | ORAL | Status: DC | PRN

## 2019-11-28 NOTE — Progress Notes (Signed)
[place] PHYSICAL MEDICINE & REHABILITATION PROGRESS NOTE   Subjective/Complaints: Had some anxiety today and got lavender aromatherapy which helped.  Pain is well controlled   ROS: Patient denies fever, rash, sore throat, blurred vision, nausea, vomiting, diarrhea, cough, shortness of breath or chest pain, joint or back pain, headache, or mood change.    Objective:   No results found. Recent Labs    11/27/19 0615  WBC 7.9  HGB 8.7*  HCT 28.4*  PLT 375   Recent Labs    11/27/19 0615  NA 136  K 4.0  CL 100  CO2 28  GLUCOSE 85  BUN 9  CREATININE 0.30*  CALCIUM 8.7*    Intake/Output Summary (Last 24 hours) at 11/28/2019 1411 Last data filed at 11/28/2019 0500 Gross per 24 hour  Intake 15 ml  Output 350 ml  Net -335 ml     Physical Exam: Vital Signs Blood pressure 96/61, pulse 91, temperature 97.9 F (36.6 C), resp. rate 18, height 5' 1"  (1.549 m), weight 45.4 kg, last menstrual period 12/15/2014, SpO2 97 %.   General: Alert and oriented x 3, No apparent distress HEENT: Head is normocephalic, atraumatic, PERRLA, EOMI, sclera anicteric, oral mucosa pink and moist, dentition intact, ext ear canals clear,  Neck: Supple without JVD or lymphadenopathy Heart: Reg rate and rhythm. No murmurs rubs or gallops Chest: CTA bilaterally without wheezes, rales, or rhonchi; no distress Abdomen: Soft, non-tender, non-distended, bowel sounds positive. Extremities: No clubbing, cyanosis, or edema. Pulses are 2+ Skin: abdominal wound with pink granulation and retention sutures. JP drains with scant tan-colored, cloudy drainage. Ostomy sealed Neuro: Pt is cognitively appropriate with normal insight, memory, and awareness. Cranial nerves 2-12 are intact. Sensory exam is normal. Reflexes are 2+ in all 4's. Fine motor coordination is intact. No tremors. Motor function is grossly 4-/5 UE prox to 4/5 disally. LE: 2 to 2+/5 HF, 3/5 KE, 4-/5 ADF/PF.  Musculoskeletal: Full ROM, No pain with  AROM or PROM in the neck, trunk, or extremities. Posture appropriate  Assessment/Plan: 1. Functional deficits secondary to debility which require 3+ hours per day of interdisciplinary therapy in a comprehensive inpatient rehab setting.  Physiatrist is providing close team supervision and 24 hour management of active medical problems listed below.  Physiatrist and rehab team continue to assess barriers to discharge/monitor patient progress toward functional and medical goals  Care Tool:  Bathing    Body parts bathed by patient: Right arm, Left arm, Chest, Abdomen, Right upper leg, Left upper leg, Right lower leg, Left lower leg, Face   Body parts bathed by helper: Front perineal area, Buttocks Body parts n/a: Front perineal area, Buttocks (did not attempt, secondary to completing earlier with nursing)   Bathing assist Assist Level: Supervision/Verbal cueing     Upper Body Dressing/Undressing Upper body dressing   What is the patient wearing?: Pull over shirt    Upper body assist Assist Level: Set up assist    Lower Body Dressing/Undressing Lower body dressing      What is the patient wearing?: Pants     Lower body assist Assist for lower body dressing: Maximal Assistance - Patient 25 - 49%     Toileting Toileting    Toileting assist Assist for toileting: Moderate Assistance - Patient 50 - 74%     Transfers Chair/bed transfer  Transfers assist     Chair/bed transfer assist level: Minimal Assistance - Patient > 75%     Locomotion Ambulation   Ambulation assist  Ambulation activity did not occur: Safety/medical concerns  Assist level: Minimal Assistance - Patient > 75% Assistive device: Walker-rolling Max distance: 26 ft   Walk 10 feet activity   Assist  Walk 10 feet activity did not occur: Safety/medical concerns  Assist level: Minimal Assistance - Patient > 75% Assistive device: Walker-rolling   Walk 50 feet activity   Assist Walk 50 feet  with 2 turns activity did not occur: Safety/medical concerns         Walk 150 feet activity   Assist Walk 150 feet activity did not occur: Safety/medical concerns         Walk 10 feet on uneven surface  activity   Assist Walk 10 feet on uneven surfaces activity did not occur: Safety/medical concerns         Wheelchair     Assist Will patient use wheelchair at discharge?: Yes Type of Wheelchair: Manual    Wheelchair assist level: Set up assist Max wheelchair distance: 100 ft    Wheelchair 50 feet with 2 turns activity    Assist        Assist Level: Set up assist   Wheelchair 150 feet activity     Assist      Assist Level: Supervision/Verbal cueing   Blood pressure 96/61, pulse 91, temperature 97.9 F (36.6 C), resp. rate 18, height 5' 1"  (1.549 m), weight 45.4 kg, last menstrual period 12/15/2014, SpO2 97 %.  Medical Problem List and Plan: 1.  Debility secondary to Crohn's disease/diverticulitis status post exploratory laparotomy with Hartman's procedure 0/27/2536 complicated by fascial dehiscence mesenteric hematoma pelvic abscess and mucocutaneous separation of colostomy status post exploratory laparotomy colostomy revision drain placement and wound VAC 09/03/4032 further complicated by development of intra-abdominal fluid collection status post right lower quadrant drain placement 11/11/2019 per interventional radiology Dr. Kathlene Cote.                -patient may not yet shower d/t abdominal wounds, drains             -ELOS/Goals: min A in 10-14 days.  -Continue CIR 2.  Antithrombotics: -DVT/anticoagulation: Lovenox             -antiplatelet therapy: N/A 3. Pain Management: Neurontin 200 mg twice daily, Lidoderm patch as directed, Robaxin 1000 mg every 8 hours, tramadol as needed. Well controlled- changed tylenol to as needed. Abdominal pain and nausea- ordered PRN xofran 4. Mood: Lexapro 10 mg daily, melatonin 3 mg nightly as needed              -antipsychotic agents: N/A. Lavender aromatherapy  - neuropsych to see to help with reactive depression/coping. She feels that lexapro has helped her. 5. Neuropsych: This patient is capable of making decisions on her own behalf. 6. Skin/Wound Care: continue local care to abdominal wounds  -JP's with decreasing tannish drainage. (5cc) 7. Fluids/Electrolytes/Nutrition: encourage po with high protein given protein malnutrition  Eating well  -protein supp for low albumin  8.  ID/intra-abdominal abscess.  Currently maintained on Cipro 500 mg twice daily  -wbc's up to 11k 8/24--normal on 8/27  -continue to monitor 9.  Acute blood loss anemia.  Hgb stable on 8/27.  10.  Hypotension.     -  Continue TED stockings  -abdominal binder doesn't do much given that it can't be wrapped tightly around drains/abd wound. Can wear if it helps with comfort.  - Daily orthostatic vital signs ordered. Still drops with standing but better  - Tramadol after therapy so  it does not drop her BP as much   -8/26: continuemidodrine trial when up with therapy 6m bid 11. Hypokalemia: 4.3  8/24    - supplement decreased to 290m daily   8/27: 4.0. Educated regarding high potassium foods 12. Crohn's disease: continue prednisone 2033maily    LOS: 5 days A FACE TO FACE EVALUATION WAS PERFORMED  KruMartha ClanRaulkar 11/28/2019, 2:11 PM

## 2019-11-28 NOTE — Progress Notes (Signed)
Occupational Therapy Session Note  Patient Details  Name: Natasha Barrera MRN: [id number] Date of Birth: 04/14/1965  Today's Date: 11/28/2019 OT Individual Time: 0923-3007 OT Individual Time Calculation (min): 69 min    Short Term Goals: Week 1:  OT Short Term Goal 1 (Week 1): Pt will complete LB bathing sit to stand with mod assist for two consecutive sessions. OT Short Term Goal 2 (Week 1): Pt will complete LB dressing with mod assist sit to stand for two consecutive sessions. OT Short Term Goal 3 (Week 1): Pt will complete toilet transfers stand pivot with use of the RW and mod assist. OT Short Term Goal 4 (Week 1): Pt will increase endurance by being able to stand at the sink with mod assist for greater than 2 mins during 1 grooming task.  Skilled Therapeutic Interventions/Progress Updates:    Pt completed supine to sit EOB with supervision using the rails for support.  She then donned her shoes and tied them with setup, using BUEs to assist with crossing each one over the opposite knee.  Mod assist stand pivot transfer was performed to transition over to the wheelchair.  She then propelled herself down to the dayroom in the wheelchair for increased UB strengthening.  She was able to then transfer to the Nustep with mod assist in order to work on muscular endurance and strengthening.  She was able to complete 3 intervals, 2 for 5 mins and 1 for 3 mins.  The first set was completed using her arms and legs with resistance on level 5 and steps per minute at 28-32.  The second set was completed on level 1 resistance with isolated use of the legs with hands on knees.  She was able to complete for 2 mins with steps per minute at 12-14.  Oxygen sats 98% with HR in the low 80s when checked.  The final set was with all extremities again and resistance on level 1.  Once completed, she reported moderate fatigue.  BP was checked at 96/60 with lower readings of 87/55 and 92/54 noted at start of session in  while sitting in the wheelchair.  Pt was asymptomatic while sitting, but did report dizziness with initial supine to sit transition and during stand pivot transfer. Returned to room with pt working from the wheelchair on emptying her colostomy with mod facilitation from therapist.  Finished session with transfer back to the bed at mod assist level stand pivot and transition to supine with supervision.  Call button and phone in reach with bed alarm in place.        Therapy Documentation Precautions:  Precautions Precautions: Fall, Other (comment) Precaution Comments: Jp drain x2 Right side / ostomy L side Required Braces or Orthoses: Other Brace Other Brace: ted hose, abdominal binder is optional if pt wants it for comfort or not (cannot be wrapped tightly enough to assist with BP management) Restrictions Weight Bearing Restrictions: No  Pain: Pain Assessment Pain Scale: Faces Faces Pain Scale: Hurts a little bit Pain Type: Acute pain Pain Location: Abdomen Pain Orientation: Medial Pain Descriptors / Indicators: Discomfort Pain Onset: With Activity Pain Intervention(s): Repositioned ADL: See Care Tool Section for some details of mobility and selfcare  Therapy/Group: Individual Therapy  Hayzen Lorenson OTR/L 11/28/2019, 3:52 PM

## 2019-11-28 NOTE — Progress Notes (Signed)
Occupational Therapy Session Note  Patient Details  Name: Natasha Barrera MRN: [id number] Date of Birth: March 26, 1966  Today's Date: 11/28/2019 OT Individual Time: 1000-1053 OT Individual Time Calculation (min): 53 min   Short Term Goals: Week 1:  OT Short Term Goal 1 (Week 1): Pt will complete LB bathing sit to stand with mod assist for two consecutive sessions. OT Short Term Goal 2 (Week 1): Pt will complete LB dressing with mod assist sit to stand for two consecutive sessions. OT Short Term Goal 3 (Week 1): Pt will complete toilet transfers stand pivot with use of the RW and mod assist. OT Short Term Goal 4 (Week 1): Pt will increase endurance by being able to stand at the sink with mod assist for greater than 2 mins during 1 grooming task.  Skilled Therapeutic Interventions/Progress Updates:    Pt greeted via PT handoff in dayroom. Agreeable to session and requesting to brush her teeth. She self propelled the w/c back to room and then engaged in stated task with setup assistance, encouraged her to stand however pt opted to sit during task. She then self propelled w/c back to the dayroom. Worked on sit<stands and standing endurance by engaging in a coloring activity in front of the elevated table. Max A for power ups with vcs. CGA for standing balance while pt engaged in coloring. She was able to stand for 1-2 minute windows before needing seated rest. Pt reports being in part limited endurance-wise by "anxiety" and "shakes" while standing. BP assessed and WNL per pt, 92/62. Provided her with lavender aromatherapy and also played preferred music to promote relaxation during session. Pt then reported having a stomachache, requesting to return to the room. Escorted her back via w/c and then she completed a slideboard transfer<bed with assistance for board placement, supervision for lateral scoot. Pt doffed her shoes using figure 4 position and then returned to bed. Left her with all needs within reach  and bed alarm set. Notified RN of pts report of stomachache and to check in on her.   Therapy Documentation Precautions:  Precautions Precautions: Fall, Other (comment) Precaution Comments: Jp drain x2 Right side / ostomy L side Required Braces or Orthoses: Other Brace Other Brace: ted hose, abdominal binder is optional if pt wants it for comfort or not (cannot be wrapped tightly enough to assist with BP management) Restrictions Weight Bearing Restrictions: No Pain: in stomach, pt stated she just wanted "to rest" to address   ADL: ADL Eating: Independent Where Assessed-Eating: Wheelchair Grooming: Setup Where Assessed-Grooming: Wheelchair Upper Body Bathing: Supervision/safety Where Assessed-Upper Body Bathing: Wheelchair Lower Body Bathing: Dependent Where Assessed-Lower Body Bathing: Wheelchair, Sitting at sink, Standing at sink Upper Body Dressing: Supervision/safety Where Assessed-Upper Body Dressing: Wheelchair Lower Body Dressing: Dependent Where Assessed-Lower Body Dressing: Wheelchair Toileting: Dependent Where Assessed-Toileting: Bedside Commode Toilet Transfer: Dependent Armed forces technical officer Method: Arts development officer: Engineer, technical sales Transfer: Not assessed Social research officer, government: Not assessed      Therapy/Group: Individual Therapy  Antonius Hartlage A Izic Stfort 11/28/2019, 12:28 PM

## 2019-11-28 NOTE — Progress Notes (Signed)
Physical Therapy Session Note  Patient Details  Name: Natasha Barrera MRN: [id number] Date of Birth: Apr 07, 1965  Today's Date: 11/28/2019 PT Individual Time: 2800-3491 and [phone] PT Individual Time Calculation (min): 27 min and 25 min  Short Term Goals: Week 1:  PT Short Term Goal 1 (Week 1): Pt will complete bed<>w/c with mod assist +1 & LRAD. PT Short Term Goal 2 (Week 1): Pt will ambulate 25 ft with mod assist & LRAD. PT Short Term Goal 3 (Week 1): Pt will complete sit<>stand consistently, from all seat heights, with mod assist +1 & LRAD.  Skilled Therapeutic Interventions/Progress Updates:  Treatment 1: Pt received in bed & agreeable to tx. Pt on hold to make meal order. PT assists pt with threading thigh high teds & pt able to assist with pulling them up, & same goes for pants at bed level. Pt performs BLE short arc quads & heel slides for strengthening with cuing for technique.  Pt rolls L, L sidelying>sitting with bed flat & bed rails with supervision. Pt reports dizziness upon sitting but reports it goes away within ~15 seconds. Pt dons shoes sitting EOB with figure 4 technique. Sit>stand with mod assist, stand pivot to w/c with RW & min assist. Pt left in w/c with chair alarm donned, call bell in reach, set up with meal tray.  Pain: denies pain but then notes unrated soreness in BLE with exercises & rest breaks taken PRN  Treatment 2: Pt received in w/c & agreeable to tx. Pt propels w/c room<>dayroom with BUE & mod I. Sit<>stand with max assist, gait x 17 ft + 26 ft with RW & min assist (PT following closely along with w/c for safety) with pt demonstrating step to pattern leading with LLE, very short step length BLE & decreased stride length. Pt left in room in handoff to OT. Pain: 4/10 in "stomach" - pt declines asking for pain medication, rest breaks provided PRN.  Therapy Documentation Precautions:  Precautions Precautions: Fall, Other (comment) Precaution Comments: Jp drain x2  Right side / ostomy L side Required Braces or Orthoses: Other Brace Other Brace: ted hose, abdominal binder is optional if pt wants it for comfort or not (cannot be wrapped tightly enough to assist with BP management) Restrictions Weight Bearing Restrictions: No     Therapy/Group: [place] 11/28/2019, 10:02 AM

## 2019-11-29 ENCOUNTER — Inpatient Hospital Stay (HOSPITAL_COMMUNITY): Payer: 59 | Admitting: Occupational Therapy

## 2019-11-29 NOTE — Progress Notes (Signed)
[facility] PHYSICAL MEDICINE & REHABILITATION PROGRESS NOTE   Subjective/Complaints: Pain is well controlled.  No complaints Husband at bedside Labs to be repeated tomorrow  ROS: Patient denies fever, rash, sore throat, blurred vision, nausea, vomiting, diarrhea, cough, shortness of breath or chest pain, joint or back pain, headache, or mood change.   Objective:   No results found. Recent Labs    11/27/19 0615  WBC 7.9  HGB 8.7*  HCT 28.4*  PLT 375   Recent Labs    11/27/19 0615  NA 136  K 4.0  CL 100  CO2 28  GLUCOSE 85  BUN 9  CREATININE 0.30*  CALCIUM 8.7*    Intake/Output Summary (Last 24 hours) at 11/29/2019 1135 Last data filed at 11/29/2019 0600 Gross per 24 hour  Intake 338 ml  Output 195 ml  Net 143 ml     Physical Exam: Vital Signs Blood pressure (!) 97/59, pulse 96, temperature 98.9 F (37.2 C), resp. rate 18, height 5' 1"  (1.549 m), weight 45.3 kg, last menstrual period 12/15/2014, SpO2 95 %. General: Alert and oriented x 3, No apparent distress HEENT: Head is normocephalic, atraumatic, PERRLA, EOMI, sclera anicteric, oral mucosa pink and moist, dentition intact, ext ear canals clear,  Neck: Supple without JVD or lymphadenopathy Heart: Reg rate and rhythm. No murmurs rubs or gallops Chest: CTA bilaterally without wheezes, rales, or rhonchi; no distress Abdomen: Soft, non-tender, non-distended, bowel sounds positive. Extremities: No clubbing, cyanosis, or edema. Pulses are 2+ Skin: abdominal wound with pink granulation and retention sutures. JP drains with scant tan-colored, cloudy drainage. Ostomy sealed Neuro: Pt is cognitively appropriate with normal insight, memory, and awareness. Cranial nerves 2-12 are intact. Sensory exam is normal. Reflexes are 2+ in all 4's. Fine motor coordination is intact. No tremors. Motor function is grossly 4-/5 UE prox to 4/5 disally. LE: 2 to 2+/5 HF, 3/5 KE, 4-/5 ADF/PF.  Musculoskeletal: Full ROM, No pain with AROM  or PROM in the neck, trunk, or extremities. Posture appropriate   Assessment/Plan: 1. Functional deficits secondary to debility which require 3+ hours per day of interdisciplinary therapy in a comprehensive inpatient rehab setting.  Physiatrist is providing close team supervision and 24 hour management of active medical problems listed below.  Physiatrist and rehab team continue to assess barriers to discharge/monitor patient progress toward functional and medical goals  Care Tool:  Bathing    Body parts bathed by patient: Right arm, Left arm, Chest, Abdomen, Right upper leg, Left upper leg, Right lower leg, Left lower leg, Face   Body parts bathed by helper: Front perineal area, Buttocks Body parts n/a: Front perineal area, Buttocks (did not attempt, secondary to completing earlier with nursing)   Bathing assist Assist Level: Supervision/Verbal cueing     Upper Body Dressing/Undressing Upper body dressing   What is the patient wearing?: Pull over shirt    Upper body assist Assist Level: Set up assist    Lower Body Dressing/Undressing Lower body dressing      What is the patient wearing?: Pants     Lower body assist Assist for lower body dressing: Maximal Assistance - Patient 25 - 49%     Toileting Toileting    Toileting assist Assist for toileting: Moderate Assistance - Patient 50 - 74%     Transfers Chair/bed transfer  Transfers assist     Chair/bed transfer assist level: Moderate Assistance - Patient 50 - 74%     Locomotion Ambulation   Ambulation assist   Ambulation  activity did not occur: Safety/medical concerns  Assist level: Minimal Assistance - Patient > 75% Assistive device: Walker-rolling Max distance: 26 ft   Walk 10 feet activity   Assist  Walk 10 feet activity did not occur: Safety/medical concerns  Assist level: Minimal Assistance - Patient > 75% Assistive device: Walker-rolling   Walk 50 feet activity   Assist Walk 50 feet  with 2 turns activity did not occur: Safety/medical concerns         Walk 150 feet activity   Assist Walk 150 feet activity did not occur: Safety/medical concerns         Walk 10 feet on uneven surface  activity   Assist Walk 10 feet on uneven surfaces activity did not occur: Safety/medical concerns         Wheelchair     Assist Will patient use wheelchair at discharge?: Yes Type of Wheelchair: Manual    Wheelchair assist level: Set up assist Max wheelchair distance: 100 ft    Wheelchair 50 feet with 2 turns activity    Assist        Assist Level: Set up assist   Wheelchair 150 feet activity     Assist      Assist Level: Supervision/Verbal cueing   Blood pressure (!) 97/59, pulse 96, temperature 98.9 F (37.2 C), resp. rate 18, height 5' 1"  (1.549 m), weight 45.3 kg, last menstrual period 12/15/2014, SpO2 95 %.  Medical Problem List and Plan: 1.  Debility secondary to Crohn's disease/diverticulitis status post exploratory laparotomy with Hartman's procedure 1/61/0960 complicated by fascial dehiscence mesenteric hematoma pelvic abscess and mucocutaneous separation of colostomy status post exploratory laparotomy colostomy revision drain placement and wound VAC 07/05/4096 further complicated by development of intra-abdominal fluid collection status post right lower quadrant drain placement 11/11/2019 per interventional radiology Dr. Kathlene Cote.                -patient may not yet shower d/t abdominal wounds, drains             -ELOS/Goals: min A in 10-14 days.  -Continue CIR 2.  Antithrombotics: -DVT/anticoagulation: Lovenox             -antiplatelet therapy: N/A 3. Pain Management: Neurontin 200 mg twice daily, Lidoderm patch as directed, Robaxin 1000 mg every 8 hours, tramadol as needed. Well controlled- changed tylenol to as needed. Abdominal pain and nausea- ordered PRN zofran. Resolved.  4. Mood: Lexapro 10 mg daily, melatonin 3 mg nightly as  needed. Mood is good. Sleeping well.              -antipsychotic agents: N/A. Lavender aromatherapy  - neuropsych to see to help with reactive depression/coping. She feels that lexapro has helped her. 5. Neuropsych: This patient is capable of making decisions on her own behalf. 6. Skin/Wound Care: continue local care to abdominal wounds  -JP's with decreasing tannish drainage. (5cc) 7. Fluids/Electrolytes/Nutrition: encourage po with high protein given protein malnutrition  Eating well  -protein supp for low albumin  8.  ID/intra-abdominal abscess.  Currently maintained on Cipro 500 mg twice daily  -wbc's up to 11k 8/24--normal on 8/27  -continue to monitor 9.  Acute blood loss anemia.  Hgb stable on 8/27.  10.  Hypotension.     -  Continue TED stockings  -abdominal binder doesn't do much given that it can't be wrapped tightly around drains/abd wound. Can wear if it helps with comfort.  - Daily orthostatic vital signs ordered. Still drops with standing  but better  - Tramadol after therapy so it does not drop her BP as much   -8/26: continuemidodrine trial when up with therapy 11m bid  8/29: continues to be low, but dizziness has improved.  11. Hypokalemia: 4.3  8/24    - supplement decreased to 244m daily   8/27: 4.0. Educated regarding high potassium foods. Edited diet to include banana with breakfast 12. Crohn's disease: continue prednisone 2051maily    LOS: 6 days A FACE TO FACE EVALUATION WAS PERFORMED  KruMartha ClanRaulkar 11/29/2019, 11:35 AM

## 2019-11-29 NOTE — Progress Notes (Signed)
Occupational Therapy Session Note  Patient Details  Name: Natasha Barrera MRN: [id number] Date of Birth: 1965/09/22  Today's Date: 11/29/2019 OT Individual Time: 1331-1426 OT Individual Time Calculation (min): 55 min    Short Term Goals: Week 1:  OT Short Term Goal 1 (Week 1): Pt will complete LB bathing sit to stand with mod assist for two consecutive sessions. OT Short Term Goal 2 (Week 1): Pt will complete LB dressing with mod assist sit to stand for two consecutive sessions. OT Short Term Goal 3 (Week 1): Pt will complete toilet transfers stand pivot with use of the RW and mod assist. OT Short Term Goal 4 (Week 1): Pt will increase endurance by being able to stand at the sink with mod assist for greater than 2 mins during 1 grooming task.   Skilled Therapeutic Interventions/Progress Updates:    Pt greeted at time of session supine in bed resting with nursing staff taking vitals, BP on the low side but pt states this is normal for her and no dizziness. Supine to sit EOB supervision and slide board transfer to wheelchair with Min A after placement under hip, cues for lifting buttocks to prevent shear. Set up at sink level and performed UB bathing Supervision, cues for remembering to detach drains. Set up for donning shirt for UB dressing. Sit to stands from [place] for 5 reps, 3 pulling from stedy bar and 2 pushing up from wheelchair arms for more functional sit to stand, overall Mod but requiring more assist for last rep and rest breaks in between. Pt wanted to wash hair, performed at sink level with hair washing board for comfort, dried hair and performed grooming after with supervision. Slide board back to bed Min, sit to supine supervision. Alarm on, call bell in reach.    Therapy Documentation Precautions:  Precautions Precautions: Fall, Other (comment) Precaution Comments: Jp drain x2 Right side / ostomy L side Required Braces or Orthoses: Other Brace Other Brace: ted hose, abdominal  binder is optional if pt wants it for comfort or not (cannot be wrapped tightly enough to assist with BP management) Restrictions Weight Bearing Restrictions: No     Therapy/Group: Individual Therapy  Viona Gilmore 11/29/2019, 3:49 PM

## 2019-11-30 ENCOUNTER — Inpatient Hospital Stay (HOSPITAL_COMMUNITY): Payer: 59 | Admitting: Physical Therapy

## 2019-11-30 ENCOUNTER — Inpatient Hospital Stay (HOSPITAL_COMMUNITY): Payer: 59 | Admitting: Occupational Therapy

## 2019-11-30 NOTE — Progress Notes (Signed)
Physical Therapy Session Note  Patient Details  Name: Natasha Barrera MRN: [id number] Date of Birth: 14-Feb-1966  Today's Date: 11/30/2019 PT Individual Time: [phone] and 4665-9935 PT Individual Time Calculation (min): 56 min and 58 min.  Short Term Goals: Week 1:  PT Short Term Goal 1 (Week 1): Pt will complete bed<>w/c with mod assist +1 & LRAD. PT Short Term Goal 2 (Week 1): Pt will ambulate 25 ft with mod assist & LRAD. PT Short Term Goal 3 (Week 1): Pt will complete sit<>stand consistently, from all seat heights, with mod assist +1 & LRAD.  Skilled Therapeutic Interventions/Progress Updates:   First session:  Pt presents supine in bed, but ready for therapy.  Total A to thread thigh-high TEDS over feet and to knees, pt pulled over knees and then pulled pant legs down.  Supervision for log roll to sit at EOB, although verbal cues for speed to roll completely.  Pt able to sit and don shoes including laces in Figure 4 position.  Pt performed SB transfers bed > w/c w/ supervision.  Pt negotiated w/c in room and hallways up to 150' w/ supervision. Pt performed sit to stand transfers w/ mod to min depending on height of surface.  Pt amb multiple trials w/ RW up to 30' including turns. Pt required min A and verbal cues for walker management and BOS w/ turns.  Pt performed Nu-step at Level 2 x 6' x 2 w/ 1' rest break between.  Pt returned to room and amb 20' from hallway to bed and transferred sit to supine w/ supervision w/ cues for log roll technique.  Nursing to check colostomy bag.  Handed off to nursing for all care.   Second session:  Pt presents sitting in w/c and agreeable to therapy.  Pt wheeled out of room and into hallway up to 150'.  PT wheeled rest of way to outside for time and energy conservation.  Ptt transferred multiple times w/ mod A and verbal cues for hand placement and initiation as well as breathing.  Pt amb up to 27' w/ CGA and RW including turn to approach seat.  Pt amb up to  30' including 2 turns to return to seat.  Pt requires seated rest break 2/2 fatigue, but no knee buckling noted although slight scissoring w/ turning to approach seat.  Pt negotiated w/c on uneven surface outside w/ supervision using BUEs.  Pt performed multiple sit to stand transfers at steps to attempt toe taps to 6" step, but unable so performed on 3" step 3 x 10.  Pt required seated rest breaks between trials 2/2 fatigue.  Pt performed step up on 3" step w/ B rails and verbal cues for sequencing and forward movement over strong leg.  Pt performed 3 x 3-5 .  Pt wheeled to room up to 150'  Pt amb from hallway into room to bed w/ RW and CGA.  Supervision for sit to supine transfer.  Bed alarm on and all needs in reach.     Therapy Documentation Precautions:  Precautions Precautions: Fall, Other (comment) Precaution Comments: Jp drain x2 Right side / ostomy L side Required Braces or Orthoses: Other Brace Other Brace: ted hose, abdominal binder is optional if pt wants it for comfort or not (cannot be wrapped tightly enough to assist with BP management) Restrictions Weight Bearing Restrictions: No General:   Vital Signs:  Pain: 0/10, abdomen sore, no meds needed.      Therapy/Group: Individual Therapy  Dellis Filbert  P Arian Mcquitty 11/30/2019, 10:48 AM

## 2019-11-30 NOTE — Progress Notes (Signed)
Occupational Therapy Session Note  Patient Details  Name: Natasha Barrera MRN: [id number] Date of Birth: [birth year]  Today's Date: 11/30/2019 OT Individual Time: 1100-1158 OT Individual Time Calculation (min): 58 min    Short Term Goals: Week 1:  OT Short Term Goal 1 (Week 1): Pt will complete LB bathing sit to stand with mod assist for two consecutive sessions. OT Short Term Goal 2 (Week 1): Pt will complete LB dressing with mod assist sit to stand for two consecutive sessions. OT Short Term Goal 3 (Week 1): Pt will complete toilet transfers stand pivot with use of the RW and mod assist. OT Short Term Goal 4 (Week 1): Pt will increase endurance by being able to stand at the sink with mod assist for greater than 2 mins during 1 grooming task.  Skilled Therapeutic Interventions/Progress Updates:    Patient in bed, alert and ready for therapy session.  Supine to sitting edge of bed with CS.   She is able to donn sneakers in unsupported sitting with CS.  SB transfer bed to w/c with CS/set up.  She completed grooming, UB bathing and dressing seated in w/c at sink with set up.  Able to propel w/c to and from therapy gym.  Standing frame x 5 minutes and 2nd attempt for 4 minutes.   Completed UB conditioning exercises with good tolerance.  Reviewed and practiced LB stretching with good carryover.  She remained seated in w/c at close of session, call bell and tray table in reach.    Therapy Documentation Precautions:  Precautions Precautions: Fall, Other (comment) Precaution Comments: Jp drain x2 Right side / ostomy L side Required Braces or Orthoses: Other Brace Other Brace: ted hose, abdominal binder is optional if pt wants it for comfort or not (cannot be wrapped tightly enough to assist with BP management) Restrictions Weight Bearing Restrictions: No   Therapy/Group: Individual Therapy  Carlos Levering 11/30/2019, 7:38 AM

## 2019-11-30 NOTE — Progress Notes (Signed)
Patient ID: Natasha Barrera, female   DOB: 07/28/1965, 54 y.o.   MRN: [id number]  SW left courtesy message for pt husband Natasha Barrera [phone]) to introduce self, explain role, discuss discharge process, and provide ELOS. SW informed will follow-up with after team conference on established discharge date.   Loralee Pacas, MSW, [place] Office: [phone] Cell: [phone] Fax: [phone]

## 2019-11-30 NOTE — Progress Notes (Signed)
[place] PHYSICAL MEDICINE & REHABILITATION PROGRESS NOTE   Subjective/Complaints: Continues to progress. Having problems with ostomy intermittently leaking. Had to leave PT early this morning as a result  ROS: Patient denies fever, rash, sore throat, blurred vision, nausea, vomiting, diarrhea, cough, shortness of breath or chest pain,  headache, or mood change.    Objective:   No results found. No results for input(s): WBC, HGB, HCT, PLT in the last 72 hours. No results for input(s): NA, K, CL, CO2, GLUCOSE, BUN, CREATININE, CALCIUM in the last 72 hours.  Intake/Output Summary (Last 24 hours) at 11/30/2019 1102 Last data filed at 11/30/2019 0739 Gross per 24 hour  Intake 802 ml  Output 160 ml  Net 642 ml     Physical Exam: Vital Signs Blood pressure (!) 108/58, pulse 96, temperature 97.8 F (36.6 C), resp. rate 18, height 5' 1"  (1.549 m), weight 46.1 kg, last menstrual period 12/15/2014, SpO2 97 %. Constitutional: No distress . Vital signs reviewed. Still frail appearing. HEENT: EOMI, oral membranes moist Neck: supple Cardiovascular: RRR without murmur. No JVD    Respiratory/Chest: CTA Bilaterally without wheezes or rales. Normal effort    GI/Abdomen: BS +,  tender, non-distended, ostomy in place, sl leak. Ext: no clubbing, cyanosis, or edema Psych: pleasant and cooperative Skin: abdominal wound with pink granulation and retention sutures--continues to close.. JP drains with scant tan-colored, cloudy drainage in one, blood tinged fluid in other---both amounts small Neuro: Pt is cognitively appropriate with normal insight, memory, and awareness. Cranial nerves 2-12 are intact. Sensory exam is normal. Reflexes are 2+ in all 4's. Fine motor coordination is intact. No tremors. Motor function is grossly 4-/5 UE prox to 4/5 disally. LE:  2+/5 HF, 3/5 KE, 4-/5 ADF/PF.  Musculoskeletal: Full ROM, No pain with AROM or PROM in the neck, trunk, or extremities. Posture  appropriate   Assessment/Plan: 1. Functional deficits secondary to debility which require 3+ hours per day of interdisciplinary therapy in a comprehensive inpatient rehab setting.  Physiatrist is providing close team supervision and 24 hour management of active medical problems listed below.  Physiatrist and rehab team continue to assess barriers to discharge/monitor patient progress toward functional and medical goals  Care Tool:  Bathing    Body parts bathed by patient: Right arm, Left arm, Chest, Abdomen, Face   Body parts bathed by helper: Front perineal area, Buttocks Body parts n/a: Front perineal area, Buttocks (did not attempt, secondary to completing earlier with nursing)   Bathing assist Assist Level: Supervision/Verbal cueing     Upper Body Dressing/Undressing Upper body dressing   What is the patient wearing?: Pull over shirt    Upper body assist Assist Level: Set up assist    Lower Body Dressing/Undressing Lower body dressing      What is the patient wearing?: Pants     Lower body assist Assist for lower body dressing: Maximal Assistance - Patient 25 - 49%     Toileting Toileting    Toileting assist Assist for toileting: Moderate Assistance - Patient 50 - 74%     Transfers Chair/bed transfer  Transfers assist     Chair/bed transfer assist level: Minimal Assistance - Patient > 75%     Locomotion Ambulation   Ambulation assist   Ambulation activity did not occur: Safety/medical concerns  Assist level: Minimal Assistance - Patient > 75% Assistive device: Walker-rolling Max distance: 25   Walk 10 feet activity   Assist  Walk 10 feet activity did not occur: Safety/medical concerns  Assist level: Minimal Assistance - Patient > 75% Assistive device: Walker-rolling   Walk 50 feet activity   Assist Walk 50 feet with 2 turns activity did not occur: Safety/medical concerns         Walk 150 feet activity   Assist Walk 150 feet  activity did not occur: Safety/medical concerns         Walk 10 feet on uneven surface  activity   Assist Walk 10 feet on uneven surfaces activity did not occur: Safety/medical concerns         Wheelchair     Assist Will patient use wheelchair at discharge?: Yes Type of Wheelchair: Manual    Wheelchair assist level: Set up assist Max wheelchair distance: 100 ft    Wheelchair 50 feet with 2 turns activity    Assist        Assist Level: Set up assist   Wheelchair 150 feet activity     Assist      Assist Level: Supervision/Verbal cueing   Blood pressure (!) 108/58, pulse 96, temperature 97.8 F (36.6 C), resp. rate 18, height 5' 1"  (1.549 m), weight 46.1 kg, last menstrual period 12/15/2014, SpO2 97 %.  Medical Problem List and Plan: 1.  Debility secondary to Crohn's disease/diverticulitis status post exploratory laparotomy with Hartman's procedure 1/61/0960 complicated by fascial dehiscence mesenteric hematoma pelvic abscess and mucocutaneous separation of colostomy status post exploratory laparotomy colostomy revision drain placement and wound VAC 07/05/4096 further complicated by development of intra-abdominal fluid collection status post right lower quadrant drain placement 11/11/2019 per interventional radiology Dr. Kathlene Cote.                -patient may not yet shower d/t abdominal wounds, drains             -ELOS/Goals: min A in 10-14 days.  -Continue CIR 2.  Antithrombotics: -DVT/anticoagulation: Lovenox             -antiplatelet therapy: N/A 3. Pain Management: Neurontin 200 mg twice daily, Lidoderm patch as directed, Robaxin 1000 mg every 8 hours, tramadol as needed. Well controlled- changed tylenol to as needed. Abdominal pain and nausea-  PRN zofran. Resolved.  4. Mood: Lexapro 10 mg daily, melatonin 3 mg nightly as needed. Mood is good. Sleeping well.              -antipsychotic agents: N/A. Lavender aromatherapy  - neuropsych to see to help with  reactive depression/coping. She feels that lexapro has helped her. 5. Neuropsych: This patient is capable of making decisions on her own behalf. 6. Skin/Wound Care: continue local care to abdominal wounds  -JP's with scant drainage, one bulb with old blood-colored fluid today 7. Fluids/Electrolytes/Nutrition: encourage po with high protein given protein malnutrition  Eating very well  -continue protein supp for low albumin  8.  ID/intra-abdominal abscess.  Currently maintained on Cipro 500 mg twice daily  -wbc's  -normal on 8/27  -continue to monitor 9.  Acute blood loss anemia.  Hgb stable on 8/27.  10.  Hypotension.     -  Continue TED stockings  -abdominal binder doesn't do much given that it can't be wrapped tightly around drains/abd wound. Can wear if it helps with comfort.  - Daily orthostatic vital signs ordered. Still drops with standing but better  - Tramadol after therapy so it does not drop her BP as much   -8/26: continuemidodrine trial when up with therapy 80m bid  8/30 bp's soft but dizziness has improved.  11.  Hypokalemia: 4.3  8/24    - supplement decreased to 69mq daily   8/27: 4.0. Educated regarding high potassium foods. Edited diet to include banana with breakfast 12. Crohn's disease: continue prednisone 256mdaily    LOS: 7 days A FACE TO FACE EVALUATION WAS PERFORMED  ZaMeredith Staggers/30/2021, 11:02 AM

## 2019-11-30 NOTE — Consult Note (Signed)
[place] Nurse ostomy follow up Patient receiving care in [facility] [phone].  Ostomy supplies in nightstand top drawer. Stoma type/location: LUQ colostomy Stomal assessment/size: deferred Peristomal assessment: deferred, changed earlier today Treatment options for stomal/peristomal skin: barrier ring Output: soft brown  Ostomy pouching: 2pc.  Education provided: none, patient and spouse have been taught. Enrolled patient in [facility] Discharge program: Yes, previously. Val Riles, RN, MSN, CWOCN, CNS-BC, pager [phone]

## 2019-12-01 ENCOUNTER — Inpatient Hospital Stay (HOSPITAL_COMMUNITY): Payer: 59 | Admitting: Physical Therapy

## 2019-12-01 ENCOUNTER — Inpatient Hospital Stay (HOSPITAL_COMMUNITY): Payer: 59

## 2019-12-01 MED ORDER — MIDODRINE HCL 5 MG PO TABS
10.0000 mg | ORAL_TABLET | Freq: Two times a day (BID) | ORAL | Status: DC
  Administered 2019-12-02 – 2019-12-09 (×15): 10 mg via ORAL
  Filled 2019-12-01 (×16): qty 2

## 2019-12-01 NOTE — Progress Notes (Signed)
Physical Therapy Weekly Progress Note  Patient Details  Name: Natasha Barrera MRN: [id number] Date of Birth: 1966/01/18  Beginning of progress report period: November 24, 2019 End of progress report period: December 01, 2019  Today's Date: 12/01/2019  Patient has met 2 of 3 short term goals.  Pt is making steady progress towards goals. Pt currently requires as little as mod assist for sit<>stand transfers & is ambulating short distances with RW & min assist. Pt would benefit from continued skilled PT treatment to address the deficits noted below to increase independence with mobility prior to d/c home.   Patient continues to demonstrate the following deficits muscle weakness, decreased cardiorespiratory endurance, decreased coordination, and decreased standing balance, decreased postural control and decreased balance strategies and therefore will continue to benefit from skilled PT intervention to increase functional independence with mobility.  Patient progressing toward long term goals..  Continue plan of care. Bed<>chair and gait goals upgraded to CGA 2/2 stedy progress.  PT Short Term Goals Week 1:  PT Short Term Goal 1 (Week 1): Pt will complete bed<>w/c with mod assist +1 & LRAD. PT Short Term Goal 1 - Progress (Week 1): Met PT Short Term Goal 2 (Week 1): Pt will ambulate 25 ft with mod assist & LRAD. PT Short Term Goal 2 - Progress (Week 1): Met PT Short Term Goal 3 (Week 1): Pt will complete sit<>stand consistently, from all seat heights, with mod assist +1 & LRAD. PT Short Term Goal 3 - Progress (Week 1): Progressing toward goal Week 2:  PT Short Term Goal 1 (Week 2): Pt will complete car transfer with mod assist & LRAD. PT Short Term Goal 2 (Week 2): Pt will ambulate 75 ft with LRAD & min assist. PT Short Term Goal 3 (Week 2): Pt will complete furniture transfer with [place].   Therapy Documentation Precautions:  Precautions Precautions: Fall, Other (comment) Precaution  Comments: Jp drain x2 Right side / ostomy L side Required Braces or Orthoses: Other Brace Other Brace: ted hose, abdominal binder is optional if pt wants it for comfort or not (cannot be wrapped tightly enough to assist with BP management) Restrictions Weight Bearing Restrictions: No   Therapy/Group: Individual Therapy  [name] 12/01/2019, 9:21 AM

## 2019-12-01 NOTE — Telephone Encounter (Signed)
Called the listed phone number. No answer. Left a message for her on the voicemail. Asking she call us back asap to confirm, cancel or reschedule her hospital follow up.

## 2019-12-01 NOTE — Progress Notes (Signed)
Pts abdominal wound had green drainage this morning. Pt states she has pain with movement at the surgical incision area. Pt is concerned about color of drainage. This nurse changed dressing, there was no leakage from ostomy site to wound at this time.

## 2019-12-01 NOTE — Progress Notes (Signed)
Occupational Therapy Session Note  Patient Details  Name: Natasha Barrera MRN: [id number] Date of Birth: Apr 01, 1966  Today's Date: 12/01/2019 OT Individual Time: 3846-6599 OT Individual Time Calculation (min): 60 min    Short Term Goals: Week 1:  OT Short Term Goal 1 (Week 1): Pt will complete LB bathing sit to stand with mod assist for two consecutive sessions. OT Short Term Goal 2 (Week 1): Pt will complete LB dressing with mod assist sit to stand for two consecutive sessions. OT Short Term Goal 3 (Week 1): Pt will complete toilet transfers stand pivot with use of the RW and mod assist. OT Short Term Goal 4 (Week 1): Pt will increase endurance by being able to stand at the sink with mod assist for greater than 2 mins during 1 grooming task.  Skilled Therapeutic Interventions/Progress Updates:    Pt received supine, no c/o pain and agreeable to OT session. Pt transitioned to EOB with (S). She donned thigh high ted hose with increased time but no physical assist. Demonstrated bag technique to reduce friction. Pt BP EOB 107/58, HR 116 bpm. Pt required seated rest break to reduce HR- still tachycardic after several minutes, increasing to 131 bpm. Discussed anxiety and used distraction with positive results, HR decreasing briefly to 115 bpm. Pt remaining at 120-131 bpm while she sat EOB for several minutes. Pt used the stedy to complete sit > stand from elevated EOB with min A. She was transferred to the toilet. She voided urine and required heavy mod A to stand from [facility] over toilet. Pt able to complete clothing management in standing. Pt was transferred to her w/c via the stedy. She completed oral care and grooming tasks at the sink with setup assist. Again, pt HR remaining high at 120-130 bpm seated at rest. 96/61 BP seated. Pt was taken to the tub room and given demo on use of TTB. Pt completed slideboard transfer from w/c > TTB with min A. Pt completed BUE ergometer for two 3 min intervals at level  6 resistance for UE endurance and strength training. Pt returned to her room via w/c and was left sitting up with chair alarm belt fastened.   Therapy Documentation Precautions:  Precautions Precautions: Fall, Other (comment) Precaution Comments: Jp drain x2 Right side / ostomy L side Required Braces or Orthoses: Other Brace Other Brace: ted hose, abdominal binder is optional if pt wants it for comfort or not (cannot be wrapped tightly enough to assist with BP management) Restrictions Weight Bearing Restrictions: No Therapy/Group: Individual Therapy  Curtis Sites 12/01/2019, 7:05 AM

## 2019-12-01 NOTE — Patient Care Conference (Signed)
Inpatient RehabilitationTeam Conference and Plan of Care Update Date: 12/01/2019   Time: 10:46 AM    Patient Name: Natasha Barrera      Medical Record Number: [id number]  Date of Birth: 1965-05-29 Sex: Female         Room/Bed: 4W05C/4W05C-01 Payor Info: Payor: Holland Falling / Plan: [employer] NAP / Product Type: *No Product type* /    Admit Date/Time:  11/23/2019  5:12 PM  Primary Diagnosis:  [facility] Problems: Principal Problem:   Debility Active Problems:   Protein-calorie malnutrition, severe ([place])    Expected Discharge Date: Expected Discharge Date: 12/11/19  Team Members Present: Physician leading conference: Dr. Alger Simons Care Coodinator Present: Loralee Pacas, LCSWA;Jermika Olden Creig Hines, RN, BSN, [place] Nurse Present:  Hyacinth Meeker, RN) PT Present: Lavone Nian, PT OT Present: Laverle Hobby, OT PPS Coordinator present : Ileana Ladd, Burna Mortimer, SLP     Current Status/Progress Goal Weekly Team Focus  Bowel/Bladder   pt cont of B and B, colostomy last emptied 11/30/19  Conistent output of bowel and remain continent of bladder   Assess q shift and prn    Swallow/Nutrition/ Hydration             ADL's   Mod A LB dressing, (S) UB dressing, Mod A stand pivot transfers, generalized deconditioning, min A slideborad  Supervision overall  functional activity, ADLs, transfers,   Mobility   supervision bed mobility, max assist sit<>stand, min assist gait for max of 85 ft with RW  min assist overall, household ambulation  transfers, bed mobility, gait, strengthening, endurance, balance, pt education, d/c planning   Communication             Safety/Cognition/ Behavioral Observations            Pain   pt complains of pain 3/10 in abdomen  decrease pain to 0  Assess q shift and prn    Skin   Colostomy, JP drain x2, surgical incision on abdomen with green drainage, sacral wound stage 1,  prevent any furthur breakdoown or infection  Assess skin q shoft and prn,  change abdominal dressing twice daily, change ostomy dressing as needed, foam dressing to bottom     Discharge Planning:  Pt to discharge to home with her husband who is currently working from home. Pt states he is planning to stay at home to provide care to her if needed at d/c. Pt to determine if the DME she has access too is in good contidion: RW, w/c and 3in1 BSC.   Team Discussion: Continent bladder, continuing issues with ostomy surrounding stoma and abdominal wound. WOC consulted. Patient is motivated to learn ostomy care. Mod assist with lower body ADL's, Supervision with upper body ADL's, Supervision goals. Supervision goals with PT. Patient on target to meet rehab goals: yes  *See Care Plan and progress notes for long and short-term goals.   Revisions to Treatment Plan:  None  Teaching Needs: Continue family education.  Current Barriers to Discharge: Wound care, Nutritional means and pain management.  Possible Resolutions to Barriers: Teach patient and family wound care and dressing changes, offer dietary supplements, continue medication regimen.     Medical Summary Current Status: Debility d/t diverticulitis/Chron's disease s/p colectomy. large abdominal wound which is closing. ostomy leaking d/t wound. eating well. pain issues at times.  Barriers to Discharge: Medical stability;Wound care   Possible Resolutions to Barriers/Weekly Focus: continued wound care mgt, pain control, nutrition, ID rx   Continued Need for Acute Rehabilitation  Level of Care: The patient requires daily medical management by a physician with specialized training in physical medicine and rehabilitation for the following reasons: Direction of a multidisciplinary physical rehabilitation program to maximize functional independence : Yes Medical management of patient stability for increased activity during participation in an intensive rehabilitation regime.: Yes Analysis of laboratory values and/or  radiology reports with any subsequent need for medication adjustment and/or medical intervention. : Yes   I attest that I was present, lead the team conference, and concur with the assessment and plan of the team.   Cristi Loron 12/01/2019, 2:01 PM

## 2019-12-01 NOTE — Progress Notes (Signed)
Nutrition Follow-up  RD working remotely.  DOCUMENTATION CODES:   Severe malnutrition in context of chronic illness  INTERVENTION:   - Continue Ensure Enlive po QID, each supplement provides 350 kcal and 20 grams of protein  - Continue ProSource 30 ml po once daily, each supplement provides 100 kcal and 15 grams of protein  - Continue MVI with minerals daily  NUTRITION DIAGNOSIS:   Severe Malnutrition related to chronic illness (Crohn's disease) as evidenced by moderate fat depletion, severe muscle depletion, percent weight loss (17.4% weight loss in 2 months).  Ongoing  GOAL:   Patient will meet greater than or equal to 90% of their needs  Progressing  MONITOR:   PO intake, Supplement acceptance, Labs, Weight trends, Skin, I & O's  REASON FOR ASSESSMENT:   Malnutrition Screening Tool    ASSESSMENT:   54 year old female with PMH of HTN, diverticulitis s/p sigmoid colectomy 2013, ventral hernia repair, tobacco use. Pt with recent admission 10/02/19 to 10/04/19 for complaints of abdominal discomfort, diarrhea, and weight loss. CT A/P at that time revealed diffuse colitis. Pt presented back 10/18/19 with increasing abdominal pain. Pt underwent colonoscopy/upper GI endoscopy 10/21/19 that showed the terminal ileum appeared normal. There was scarring and mucosal bridging as well as scattered areas of ulceration throughout the entire colon. Findings favored Crohn's colitis. 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 On 11/04/19 pt underwent ex-lap with colostomy revision, fascial closure, placement of 4 retention sutures and wound VAC along with right JP  drain placement. Admitted to CIR on 8/23.  Pt accepting >75% of Ensure Enlive supplements and 100% of ProSource supplements per [facility] documentation. PO intake at meals has improved overall. Pt reporting great appetite to MD per notes.  Weight fairly stable since last RD visit with fluctuations between 99-105 lbs since admit.  Attempted to reach pt via phone call to room; however, no answer. RD will continue with current supplement regimen. Will continue to monitor weights.  Meal Completion: 40-100% (averaging ~83%)  Medications reviewed and include: ProSource 30 ml daily, Ensure Enlive QID, MVI with minerals, protonix, prednisone, vitamin A  Labs reviewed.  Diet Order:   Diet Order            DIET SOFT Room service appropriate? Yes; Fluid consistency: Thin  Diet effective now                 EDUCATION NEEDS:   Education needs have been addressed  Skin:  Skin Assessment: Skin Integrity Issues: Stage I: left buttocks Stage II: right buttocks Incisions: abdomen  Last BM:  11/30/19 colostomy  Height:   Ht Readings from Last 1 Encounters:  11/24/19 5' 1"  (1.549 m)    Weight:   Wt Readings from Last 1 Encounters:  12/01/19 46.9 kg    BMI:  Body mass index is 19.54 kg/m.  Estimated Nutritional Needs:   Kcal:  1800-2000  Protein:  90-105 grams  Fluid:  >/= 1.8 L    Gaynell Face, MS, RD, LDN Inpatient Clinical Dietitian Please see [facility] for contact information.

## 2019-12-01 NOTE — Progress Notes (Signed)
Physical Therapy Session Note  Patient Details  Name: Natasha Barrera MRN: [id number] Date of Birth: [birth year]  Today's Date: 12/01/2019 PT Individual Time: 4536-4680 and 1420-1530 PT Individual Time Calculation (min): 55 min and 70 min  Short Term Goals: Week 2:  PT Short Term Goal 1 (Week 2): Pt will complete car transfer with mod assist & LRAD. PT Short Term Goal 2 (Week 2): Pt will ambulate 75 ft with LRAD & min assist. PT Short Term Goal 3 (Week 2): Pt will complete furniture transfer with [place].  Skilled Therapeutic Interventions/Progress Updates:  Treatment 1: Pt received in w/c & agreeable to tx. W/c mobility with BUE & set up assist, with cuing to lock w/c brakes prior to transferring. Pt requires max assist for sit<>stand with min cuing for hand placement, but otherwise min assist for ambulatory transfers to bed & car at sedan simulated height. Pt utilizes BUE to assist LE in/out of car. Gait x 82 ft + 85 ft with RW & min assist with PT following along with w/c. PT provides cuing & pt able to progress from step to to step through pattern. Sit<>Stand from elevated mat with pt pushing on BLE with BUE & mod/max assist with activity focusing on glute/hip strengthening. Back in room, pt transfers sit>supine with improved ability to complete log rolling technique and supervision. Pt left in bed with alarm set, call bell & all needs in reach.  Pain: 3/10 wound soreness, rest breaks provided, pt assisted back to bed at end of session to rest  Treatment 2: Pt received in bed & agreeable to tx. Bed mobility with mod I with hospital bed features & pt dons/doffs tennis shoes sitting EOB with set up assist. Ostomy bag leaking & RN made aware & attends to it. Pt doffs pants from bed level & dons paper scrub pants in same manner with min assist to thread on BLE. Sit<>stand from EOB & w/c throughout session with max assist. Pt ambulates into bathroom with RW & min assist & manages clothing  without assistance. Pt with continent void on toilet but unable to transfer to standing despite multiple attempts, various hand placement and max assist so utilized stedy & pt able to achieve transfer. Pt assisted to w/c via stedy & pt performs hand hygiene from w/c level with set up assist. W/c mobility with BUE & set up assist room>north tower for BUE strengthening & cardiopulmonary endurance training. Assisted pt outside & pt ambulates over uneven concrete ~25 ft with RW & min assist. Back on unit, provided pt with energy conservation handout & reviewed them with her. Pt returns to bed & left in bed with alarm set, call bell & all needs in reach.  Pain: 3/10 in abdominal wound - rest breaks provided PRN  Therapy Documentation Precautions:  Precautions Precautions: Fall, Other (comment) Precaution Comments: Jp drain x2 Right side / ostomy L side Required Braces or Orthoses: Other Brace Other Brace: ted hose, abdominal binder is optional if pt wants it for comfort or not (cannot be wrapped tightly enough to assist with BP management) Restrictions Weight Bearing Restrictions: No  Therapy/Group: Individual Therapy  [name] 12/01/2019, 3:46 PM

## 2019-12-01 NOTE — Progress Notes (Signed)
Patient ID: Natasha Barrera, female   DOB: 1965-12-23, 54 y.o.   MRN: [id number]  SW met with pt in room to provide updates from team conference, and d/c date 9/10. Pt became emotional as she would like to know if she is able to d/c before date as she she has been here for two months. SW to relay concerns to medical team and follow-up.   Loralee Pacas, MSW, [place] Office: [phone] Cell: [phone] Fax: [phone]

## 2019-12-01 NOTE — Plan of Care (Signed)
Goals below upgraded to [place] 2/2 progress.  Problem: RH Bed to Chair Transfers Goal: LTG Patient will perform bed/chair transfers w/assist (PT) Description: LTG: Patient will perform bed to chair transfers with assistance (PT). Flowsheets (Taken 12/01/2019 0917) LTG: Pt will perform Bed to Chair Transfers with assistance level: (upgrade 2/2 progress) Contact Guard/Touching assist Note: upgrade 2/2 progress   Problem: RH Ambulation Goal: LTG Patient will ambulate in controlled environment (PT) Description: LTG: Patient will ambulate in a controlled environment, # of feet with assistance (PT). Flowsheets (Taken 12/01/2019 0917) LTG: Pt will ambulate in controlled environ  assist needed:: (upgrade 2/2 progress) Contact Guard/Touching assist LTG: Ambulation distance in controlled environment: 100 ft with LRAD Note: upgrade 2/2 progress Goal: LTG Patient will ambulate in home environment (PT) Description: LTG: Patient will ambulate in home environment, # of feet with assistance (PT). Flowsheets (Taken 12/01/2019 0917) LTG: Pt will ambulate in home environ  assist needed:: (upgrade 2/2 progress) Contact Guard/Touching assist LTG: Ambulation distance in home environment: 50 ft with LRAD Note: upgrade 2/2 progress

## 2019-12-01 NOTE — Progress Notes (Signed)
[facility] PHYSICAL MEDICINE & REHABILITATION PROGRESS NOTE   Subjective/Complaints: No big changes. Noted some greenish drainage on her dressing. [place] RN spoke with her re: ostomy seal, adjustments. Working through pain. Still feels weak at times  ROS: Patient denies fever, rash, sore throat, blurred vision, nausea, vomiting, diarrhea, cough, shortness of breath or chest pain,   headache, or mood change.     Objective:   No results found. No results for input(s): WBC, HGB, HCT, PLT in the last 72 hours. No results for input(s): NA, K, CL, CO2, GLUCOSE, BUN, CREATININE, CALCIUM in the last 72 hours.  Intake/Output Summary (Last 24 hours) at 12/01/2019 1201 Last data filed at 12/01/2019 0820 Gross per 24 hour  Intake 822 ml  Output --  Net 822 ml     Physical Exam: Vital Signs Blood pressure (!) 102/58, pulse 88, temperature 97.7 F (36.5 C), temperature source Oral, resp. rate 16, height 5' 1"  (1.549 m), weight 46.9 kg, last menstrual period 12/15/2014, SpO2 97 %. Constitutional: No distress . Vital signs reviewed. HEENT: EOMI, oral membranes moist Neck: supple Cardiovascular: RRR without murmur. No JVD    Respiratory/Chest: CTA Bilaterally without wheezes or rales. Normal effort    GI/Abdomen: BS +,  tender, non-distended Ext: no clubbing, cyanosis, or edema Psych: pleasant and cooperative Skin: abdominal wound with pink granulation and retention sutures--no abnormal drainage, looks clean. JP drains with minimal contents Neuro: Pt is cognitively appropriate with normal insight, memory, and awareness. Cranial nerves 2-12 are intact. Sensory exam is normal. Reflexes are 2+ in all 4's. Fine motor coordination is intact. No tremors. Motor function is grossly 4-/5 UE prox to 4/5 disally. LE:  2+/5 HF, 3/5 KE, 4-/5 ADF/PF.  Musculoskeletal: Full ROM, No pain with AROM or PROM in the neck, trunk, or extremities. Posture appropriate   Assessment/Plan: 1. Functional deficits secondary  to debility which require 3+ hours per day of interdisciplinary therapy in a comprehensive inpatient rehab setting.  Physiatrist is providing close team supervision and 24 hour management of active medical problems listed below.  Physiatrist and rehab team continue to assess barriers to discharge/monitor patient progress toward functional and medical goals  Care Tool:  Bathing    Body parts bathed by patient: Right arm, Left arm, Chest, Abdomen, Face   Body parts bathed by helper: Front perineal area, Buttocks Body parts n/a: Front perineal area, Buttocks (did not attempt, secondary to completing earlier with nursing)   Bathing assist Assist Level: Supervision/Verbal cueing     Upper Body Dressing/Undressing Upper body dressing   What is the patient wearing?: Pull over shirt    Upper body assist Assist Level: Set up assist    Lower Body Dressing/Undressing Lower body dressing      What is the patient wearing?: Pants     Lower body assist Assist for lower body dressing: Maximal Assistance - Patient 25 - 49%     Toileting Toileting    Toileting assist Assist for toileting: Moderate Assistance - Patient 50 - 74%     Transfers Chair/bed transfer  Transfers assist     Chair/bed transfer assist level: Moderate Assistance - Patient 50 - 74%     Locomotion Ambulation   Ambulation assist   Ambulation activity did not occur: Safety/medical concerns  Assist level: Minimal Assistance - Patient > 75% Assistive device: Walker-rolling Max distance: 85 ft   Walk 10 feet activity   Assist  Walk 10 feet activity did not occur: Safety/medical concerns  Assist level:  Minimal Assistance - Patient > 75% Assistive device: Walker-rolling   Walk 50 feet activity   Assist Walk 50 feet with 2 turns activity did not occur: Safety/medical concerns  Assist level: Minimal Assistance - Patient > 75% Assistive device: Walker-rolling    Walk 150 feet activity   Assist  Walk 150 feet activity did not occur: Safety/medical concerns         Walk 10 feet on uneven surface  activity   Assist Walk 10 feet on uneven surfaces activity did not occur: Safety/medical concerns   Assist level: Minimal Assistance - Patient > 75% Assistive device: Aeronautical engineer Will patient use wheelchair at discharge?: Yes Type of Wheelchair: Manual    Wheelchair assist level: Set up assist Max wheelchair distance: 150 ft    Wheelchair 50 feet with 2 turns activity    Assist        Assist Level: Set up assist   Wheelchair 150 feet activity     Assist      Assist Level: Set up assist   Blood pressure (!) 102/58, pulse 88, temperature 97.7 F (36.5 C), temperature source Oral, resp. rate 16, height 5' 1"  (1.549 m), weight 46.9 kg, last menstrual period 12/15/2014, SpO2 97 %.  Medical Problem List and Plan: 1.  Debility secondary to Crohn's disease/diverticulitis status post exploratory laparotomy with Hartman's procedure 2/97/9892 complicated by fascial dehiscence mesenteric hematoma pelvic abscess and mucocutaneous separation of colostomy status post exploratory laparotomy colostomy revision drain placement and wound VAC 04/03/9415 further complicated by development of intra-abdominal fluid collection status post right lower quadrant drain placement 11/11/2019 per interventional radiology Dr. Kathlene Cote.                -patient may not yet shower d/t abdominal wounds, drains             -ELOS/Goals: 9/10  -Continue CIR 2.  Antithrombotics: -DVT/anticoagulation: Lovenox             -antiplatelet therapy: N/A 3. Pain Management: Neurontin 200 mg twice daily, Lidoderm patch as directed, Robaxin 1000 mg every 8 hours, tramadol as needed. Well controlled- changed tylenol to as needed. Abdominal pain and nausea-  PRN zofran. improved.  4. Mood: Lexapro 10 mg daily, melatonin 3 mg nightly as needed. Mood is good. Sleeping well.               -antipsychotic agents: N/A. Lavender aromatherapy  - neuropsych to see to help with reactive depression/coping. She feels that lexapro has helped her. 5. Neuropsych: This patient is capable of making decisions on her own behalf. 6. Skin/Wound Care: continue local care to abdominal wounds  -JP's with scant drainage  -saw no abnormal drainage today  -continues on abx as below 7. Fluids/Electrolytes/Nutrition: encourage po with high protein given protein malnutrition  Eating very well  -continue protein supp for low albumin  8.  ID/intra-abdominal abscess.  Currently maintained on Cipro 500 mg twice daily  -wbc's  -normal on 8/27  -continue to monitor 9.  Acute blood loss anemia.  Hgb stable on 8/27.  10.  Hypotension.     -  Continue TED stockings  -abdominal binder doesn't do much given that it can't be wrapped tightly around drains/abd wound. Can wear if it helps with comfort.  -  Tramadol after therapy so it does not drop her BP as much   -8/26: continue midodrine trial when up with therapy 2m bid  8/31 will bump  midodrine to 24m .  11. Hypokalemia: 4.3  8/24    - supplement decreased to 258m daily     12. Crohn's disease: continue prednisone 20107maily    LOS: 8 days A FACE TO FACE EVALUATION WAS PERFORMED  ZacMeredith Staggers31/2021, 12:01 PM

## 2019-12-02 ENCOUNTER — Inpatient Hospital Stay (HOSPITAL_COMMUNITY): Payer: 59 | Admitting: Physical Therapy

## 2019-12-02 ENCOUNTER — Inpatient Hospital Stay (HOSPITAL_COMMUNITY): Payer: 59

## 2019-12-02 NOTE — Progress Notes (Signed)
Recreational Therapy Session Note  Patient Details  Name: Natasha Barrera MRN: [id number] Date of Birth: 07/24/1965 Today's Date: 12/02/2019  Pain: no c/o Skilled Therapeutic Interventions/Progress Updates: Session focused on activity tolerance and dynamic standing balance during co-treat with PT.  Pt enjoys playing games with family on the weekends.  This session focused on endurance and dynamic standing balance for Cornhole game. Pt stood with Min assist with 1UE support ion RW.  Therapy/Group: Co-Treatment Author [place] 12/02/2019, 1:26 PM

## 2019-12-02 NOTE — Progress Notes (Signed)
[place] [place] PHYSICAL MEDICINE & REHABILITATION PROGRESS NOTE   Subjective/Complaints: Up with OT. No new complaints today. Would really like to go home sooner than ELOS given that she's been in the hospital so long  ROS: Patient denies fever, rash, sore throat, blurred vision, nausea, vomiting, diarrhea, cough, shortness of breath or chest pain, joint or back pain, headache, or mood change.    Objective:   No results found. No results for input(s): WBC, HGB, HCT, PLT in the last 72 hours. No results for input(s): NA, K, CL, CO2, GLUCOSE, BUN, CREATININE, CALCIUM in the last 72 hours.  Intake/Output Summary (Last 24 hours) at 12/02/2019 0853 Last data filed at 12/02/2019 0734 Gross per 24 hour  Intake 702 ml  Output 40 ml  Net 662 ml     Physical Exam: Vital Signs Blood pressure 98/66, pulse 96, temperature 98.5 F (36.9 C), temperature source Oral, resp. rate 16, height 5' 1"  (1.549 m), weight 49.2 kg, last menstrual period 12/15/2014, SpO2 95 %. Constitutional: No distress . Vital signs reviewed. Frail appearing HEENT: EOMI, oral membranes moist Neck: supple Cardiovascular: RRR without murmur. No JVD    Respiratory/Chest: CTA Bilaterally without wheezes or rales. Normal effort    GI/Abdomen: BS +,  tender, non-distended Ext: no clubbing, cyanosis, or edema Psych: pleasant and cooperative. A little more down today Skin: abdominal wound with pink granulation and retention sutures--no abnormal drainage, looks clean. JP drains with minimal drainage. Still blood tinged in one JP Neuro: Pt is cognitively appropriate with normal insight, memory, and awareness. Cranial nerves 2-12 are intact. Sensory exam is normal. Reflexes are 2+ in all 4's. Fine motor coordination is intact. No tremors. Motor function is grossly 4-/5 UE prox to 4/5 disally. LE:  2+/5 HF, 3/5 KE, 4-/5 ADF/PF.  Musculoskeletal: Full ROM, No pain with AROM or PROM in the neck, trunk, or extremities. Posture  appropriate   Assessment/Plan: 1. Functional deficits secondary to debility which require 3+ hours per day of interdisciplinary therapy in a comprehensive inpatient rehab setting.  Physiatrist is providing close team supervision and 24 hour management of active medical problems listed below.  Physiatrist and rehab team continue to assess barriers to discharge/monitor patient progress toward functional and medical goals  Care Tool:  Bathing    Body parts bathed by patient: Right arm, Left arm, Chest, Abdomen, Face   Body parts bathed by helper: Front perineal area, Buttocks Body parts n/a: Front perineal area, Buttocks (did not attempt, secondary to completing earlier with nursing)   Bathing assist Assist Level: Supervision/Verbal cueing     Upper Body Dressing/Undressing Upper body dressing   What is the patient wearing?: Pull over shirt    Upper body assist Assist Level: Set up assist    Lower Body Dressing/Undressing Lower body dressing      What is the patient wearing?: Pants     Lower body assist Assist for lower body dressing: Maximal Assistance - Patient 25 - 49%     Toileting Toileting    Toileting assist Assist for toileting: Moderate Assistance - Patient 50 - 74%     Transfers Chair/bed transfer  Transfers assist     Chair/bed transfer assist level: Moderate Assistance - Patient 50 - 74%     Locomotion Ambulation   Ambulation assist   Ambulation activity did not occur: Safety/medical concerns  Assist level: Minimal Assistance - Patient > 75% Assistive device: Walker-rolling Max distance: 25 ft   Walk 10 feet activity   Assist  Walk 10 feet activity did not occur: Safety/medical concerns  Assist level: Minimal Assistance - Patient > 75% Assistive device: Walker-rolling   Walk 50 feet activity   Assist Walk 50 feet with 2 turns activity did not occur: Safety/medical concerns  Assist level: Minimal Assistance - Patient >  75% Assistive device: Walker-rolling    Walk 150 feet activity   Assist Walk 150 feet activity did not occur: Safety/medical concerns         Walk 10 feet on uneven surface  activity   Assist Walk 10 feet on uneven surfaces activity did not occur: Safety/medical concerns   Assist level: Minimal Assistance - Patient > 75% Assistive device: Aeronautical engineer Will patient use wheelchair at discharge?: Yes Type of Wheelchair: Manual    Wheelchair assist level: Set up assist Max wheelchair distance: 150 ft    Wheelchair 50 feet with 2 turns activity    Assist        Assist Level: Set up assist   Wheelchair 150 feet activity     Assist      Assist Level: Set up assist   Blood pressure 98/66, pulse 96, temperature 98.5 F (36.9 C), temperature source Oral, resp. rate 16, height 5' 1"  (1.549 m), weight 49.2 kg, last menstrual period 12/15/2014, SpO2 95 %.  Medical Problem List and Plan: 1.  Debility secondary to Crohn's disease/diverticulitis status post exploratory laparotomy with Hartman's procedure 7/98/9211 complicated by fascial dehiscence mesenteric hematoma pelvic abscess and mucocutaneous separation of colostomy status post exploratory laparotomy colostomy revision drain placement and wound VAC 12/05/1738 further complicated by development of intra-abdominal fluid collection status post right lower quadrant drain placement 11/11/2019 per interventional radiology Dr. Kathlene Cote.                -patient may not yet shower d/t abdominal wounds, drains             -ELOS/Goals: 9/10--may move up date given pt's emotional fatigue from being in hospital. Will depend on fxnl/medical status  -Continue CIR 2.  Antithrombotics: -DVT/anticoagulation: Lovenox             -antiplatelet therapy: N/A 3. Pain Management: Neurontin 200 mg twice daily, Lidoderm patch as directed, Robaxin 1000 mg every 8 hours, tramadol as needed. Well controlled-  changed tylenol to as needed. Abdominal pain and nausea-  PRN zofran. improved.  4. Mood: Lexapro 10 mg daily, melatonin 3 mg nightly as needed. Mood is good. Sleeping well.              -antipsychotic agents: N/A. Lavender aromatherapy  - neuropsych asked to see to help with reactive depression/coping. She feels that lexapro has helped her. 5. Neuropsych: This patient is capable of making decisions on her own behalf. 6. Skin/Wound Care: continue local care to abdominal wounds  -JP's with scant drainage  -one with old blood  -continues on abx as below 7. Fluids/Electrolytes/Nutrition: encourage po with high protein given protein malnutrition  Eating very well  -continue protein supp for low albumin  8.  ID/intra-abdominal abscess.  Currently maintained on Cipro 500 mg twice daily  -wbc's  -normal on 8/27---recheck Friday  -continue to monitor 9.  Acute blood loss anemia.  Hgb stable on 8/27. Recheck Friday 10.  Hypotension.     -  Continue TED stockings  -abdominal binder doesn't do much given that it can't be wrapped tightly around drains/abd wound. Can wear if it helps with comfort.  -  Tramadol after therapy so it does not drop her BP as much   -8/26: continue midodrine trial when up with therapy 22m bid  8/31 increased midodrine to 180m--> observe today 11. Hypokalemia: 4.3  8/24    - supplement decreased to 2072mdaily   -check labs Friday 12. Crohn's disease: continue prednisone 14m28mily    LOS: 9 days A FACE TO FACE EVALUATION WAS PERFORMED  ZachMeredith Staggers/2021, 8:53 AM

## 2019-12-02 NOTE — Progress Notes (Signed)
Physical Therapy Session Note  Patient Details  Name: Natasha Barrera MRN: [id number] Date of Birth: [birth year]  Today's Date: 12/02/2019 PT Individual Time: 0865-7846 and 9629-5284 PT Individual Time Calculation (min): 56 min and 75 min  Short Term Goals: Week 2:  PT Short Term Goal 1 (Week 2): Pt will complete car transfer with mod assist & LRAD. PT Short Term Goal 2 (Week 2): Pt will ambulate 75 ft with LRAD & min assist. PT Short Term Goal 3 (Week 2): Pt will complete furniture transfer with [place].  Skilled Therapeutic Interventions/Progress Updates:  Treatment 1: Pt received in bed & agreeable to tx. Pt dons/doffs tennis shoes sitting EOB with set up assist. Max assist for sit<>stand transfers throughout session with min cuing for hand placement as pt now wants to put BUE on RW for sit>stand. Once upright, pt can ambulate with CGA with RW short distances to w/c & max of 78 ft in dayroom. In apartment, pt completes transfer to rocking recliner with max assist sit>stand & PT educating pt on blocking recliner with book to prevent movement to increase safety with transfers. Pt completes bed mobility in apartment with supervision, using BUE to assist LE onto bed. From elevated EOM pt performs multiple sets of sit<>stand with BUE on thighs with PT progressively lowering height of mat & task focusing on BLE strengthening. Pt unable to perform sit<>stand from elevated EOM with BUE across chest even with assistance, & once mat height becomes lower PT provides approximation at L knee to assist pt with transfer. At end of session pt left in bed with alarm set, call bell & all needs in reach. Pain: c/o unrated abdominal wound soreness, rest breaks provided PRN  Treatment 2: Pt received in bed & agreeable to tx. Pt dons shoes sitting EOB with set up assist. Sit<>stand with max assist with ongoing min cuing for hand placement during transfers, but once up pt requires as little as CGA for gait with  RW bed<>w/c. W/c mobility with BLE for BLE strengthening. Kinetron in sitting, 1 minute x 5 trials, up to 30 cm/sec for BLE strengthening. Pt performs mini squats with BUE support on RW, 3 sets x 7-10 reps, & standing hip extension 2 sets x 10 reps with PT providing tactile cuing for posture & verbal cuing for technique. Recreational therapist present for last 30 minutes of session & pt engaged in standing & playing corn hole with focus on standing tolerance and balance (pt has LUE support & CGA<>min assist). At end of session pt left in bed with alarm set, call bell & all needs in reach.  Pain: no formal c/o pain reported  Therapy Documentation Precautions:  Precautions Precautions: Fall, Other (comment) Precaution Comments: Jp drain x2 Right side / ostomy L side Required Braces or Orthoses: Other Brace Other Brace: ted hose, abdominal binder is optional if pt wants it for comfort or not (cannot be wrapped tightly enough to assist with BP management) Restrictions Weight Bearing Restrictions: No   Therapy/Group: Individual Therapy  Natasha Barrera 12/02/2019, 12:17 PM

## 2019-12-02 NOTE — Progress Notes (Signed)
Occupational Therapy Weekly Progress Note  Patient Details  Name: Natasha Barrera MRN: [id number] Date of Birth: 10/31/1965  Beginning of progress report period: November 24, 2019 End of progress report period: December 02, 2019  Today's Date: 12/02/2019 OT Individual Time: 1300-1400 OT Individual Time Calculation (min): 60 min    Patient has met 4 of 4 short term goals.  Pt has made steady progress toward her OT POC. She is motivated and eager to return to her PLOF. She is able to complete UB ADLs with set up assist, and lower body with mod A. Pt can complete ADL transfers with a slideboard with CGA, and requires mod-max A for use of RW.   Patient continues to demonstrate the following deficits: muscle weakness, impaired timing and sequencing and unbalanced muscle activation and decreased sitting balance, decreased standing balance, decreased postural control and decreased balance strategies and therefore will continue to benefit from skilled OT intervention to enhance overall performance with BADL.  Patient progressing toward long term goals..  Continue plan of care.  OT Short Term Goals Week 1:  OT Short Term Goal 1 (Week 1): Pt will complete LB bathing sit to stand with mod assist for two consecutive sessions. OT Short Term Goal 1 - Progress (Week 1): Met OT Short Term Goal 2 (Week 1): Pt will complete LB dressing with mod assist sit to stand for two consecutive sessions. OT Short Term Goal 2 - Progress (Week 1): Met OT Short Term Goal 3 (Week 1): Pt will complete toilet transfers stand pivot with use of the RW and mod assist. OT Short Term Goal 3 - Progress (Week 1): Met OT Short Term Goal 4 (Week 1): Pt will increase endurance by being able to stand at the sink with mod assist for greater than 2 mins during 1 grooming task. OT Short Term Goal 4 - Progress (Week 1): Met Week 2:  OT Short Term Goal 1 (Week 2): Pt will complete transfer to TTB with LRAD with (S) OT Short Term Goal 2 (Week  2): Pt will don LB clothing with min A, sit <> stand OT Short Term Goal 3 (Week 2): Pt will demonstrate increased functional activity tolerance by standing for 2 ADLs  Skilled Therapeutic Interventions/Progress Updates:    Pt received supine with no c/o pain. Pt completed bed mobility to EOB with (S). Pt used slideboard to transfer to w/c with CGA, assist with positioning board. Pt completed slideboard transfer to the [facility] over the toilet with assist for positioning board. Pt required min A to remove LB clothing. Pt able to complete hygiene with (S) on BSC. Pt completed transfer back to w/c with CGA. Pt requested to wash hair in the sink. Hair washing tray was obtained and pt provided max A to wash hair. Pt was taken via w/c to the therapy gym. She placed and completed slideboard transfer to the mat with close (S). Pt transitioned to supine and completed 3x 10 glute bridges to increase BLE strengthening needed for ADL transfers. Pt also completed 1 set of 15 clam shells each side- pt really enjoyed these and we discussed sidelying in bed for pressure relief. Pt returned to w/c and then to room. Pt completed slideboard transfer back to bed with (S) overall. Pt was left supine with all needs met, bed alarm set.   Therapy Documentation Precautions:  Precautions Precautions: Fall, Other (comment) Precaution Comments: Jp drain x2 Right side / ostomy L side Required Braces or Orthoses: Other Brace Other  Brace: ted hose, abdominal binder is optional if pt wants it for comfort or not (cannot be wrapped tightly enough to assist with BP management) Restrictions Weight Bearing Restrictions: No   Therapy/Group: Individual Therapy  Curtis Sites 12/02/2019, 6:35 AM

## 2019-12-03 ENCOUNTER — Inpatient Hospital Stay (HOSPITAL_COMMUNITY): Payer: 59

## 2019-12-03 ENCOUNTER — Inpatient Hospital Stay (HOSPITAL_COMMUNITY): Payer: 59 | Admitting: *Deleted

## 2019-12-03 NOTE — Progress Notes (Signed)
Physical Therapy Session Note  Patient Details  Name: KRYSSA RISENHOOVER MRN: [id number] Date of Birth: 1965/04/12  Today's Date: 12/03/2019 PT Individual Time: 0390-5646 PT Individual Time Calculation (min): 58 min   Short Term Goals: Week 1:  PT Short Term Goal 1 (Week 1): Pt will complete bed<>w/c with mod assist +1 & LRAD. PT Short Term Goal 1 - Progress (Week 1): Met PT Short Term Goal 2 (Week 1): Pt will ambulate 25 ft with mod assist & LRAD. PT Short Term Goal 2 - Progress (Week 1): Met PT Short Term Goal 3 (Week 1): Pt will complete sit<>stand consistently, from all seat heights, with mod assist +1 & LRAD. PT Short Term Goal 3 - Progress (Week 1): Progressing toward goal  Skilled Therapeutic Interventions/Progress Updates:    Patient received sitting up in wc agreeable to PT. She reports 4/10 "soreness" in abdomen. Patient provided with extended rest breaks throughout therapy session to assist with pain management. Patient propelled herself in wc from room to therapy gym. Able to complete STS x4 in // bars with B knees blocked to assist with facilitating B LE extension into standing. Patient able to ambulate fwd/retro in // bars x4 laps with CGA and B UE on bars. Patient completing STS x2 with FWW outside of // bars with up to [place] based on fatigue. She was able to ambulate 43f and 854fwith FWW and CGA. She displays mod/severe proximal LE weakness and functional distal LE strength. Despite LE fatigue, she is able to control descent into wc. Patient propelled herself back to room and transferred back to bed via SBT with SPV. Bed alarm on, call light within reach.   Therapy Documentation Precautions:  Precautions Precautions: Fall, Other (comment) Precaution Comments: Jp drain x2 Right side / ostomy L side Required Braces or Orthoses: Other Brace Other Brace: ted hose, abdominal binder is optional if pt wants it for comfort or not (cannot be wrapped tightly enough to assist with BP  management) Restrictions Weight Bearing Restrictions: No    Therapy/Group: Individual Therapy  JeKaroline CaldwellPT, DPT, CBIS 12/03/2019, 3:49 PM

## 2019-12-03 NOTE — Progress Notes (Signed)
Recreational Therapy Session Note  Patient Details  Name: Natasha Barrera MRN: [id number] Date of Birth: 25-Jun-1965 Today's Date: 12/03/2019  Pain: no c/o Skilled Therapeutic Interventions/Progress Updates: Session focused on discharge planning, pt education in regards to self monitoring and energy conservation.  Pt identified potential activity modifications and potential energy conserving ideas to assist with success in upcoming discharge with supervision level cuing/.  Therapy/Group: Individual Therapy  Arnulfo Batson 12/03/2019, 4:21 PM

## 2019-12-03 NOTE — Progress Notes (Signed)
Occupational Therapy Session Note  Patient Details  Name: Natasha Barrera MRN: [id number] Date of Birth: Sep 19, 1965  Today's Date: 12/03/2019 OT Individual Time: 1300-1345 OT Individual Time Calculation (min): 45 min    Short Term Goals: Week 1:  OT Short Term Goal 1 (Week 1): Pt will complete LB bathing sit to stand with mod assist for two consecutive sessions. OT Short Term Goal 1 - Progress (Week 1): Met OT Short Term Goal 2 (Week 1): Pt will complete LB dressing with mod assist sit to stand for two consecutive sessions. OT Short Term Goal 2 - Progress (Week 1): Met OT Short Term Goal 3 (Week 1): Pt will complete toilet transfers stand pivot with use of the RW and mod assist. OT Short Term Goal 3 - Progress (Week 1): Met OT Short Term Goal 4 (Week 1): Pt will increase endurance by being able to stand at the sink with mod assist for greater than 2 mins during 1 grooming task. OT Short Term Goal 4 - Progress (Week 1): Met  Skilled Therapeutic Interventions/Progress Updates:    1:1. Pt received in bed agreeable to OT after arousal. tp completes SB transfers with set up and no VC for technqiue. Pt even placing board for 2 transfers throhgotu session. Pt completes oral care and UB dressing at sink with set up. tp completes therex in side lying and hooklying on mat: 2x10 each LE with MOD manual reasistance: hip flexion/ext and clamshells for ab/adduction. Pt completes 2x5 glute bridges with OT A lifting hips higher and pt holding isometrically in position for 5 seconds with controlled decent to mat. Exited sessionw iht pt seated in w/c, exit alarm on and call light in reach  Therapy Documentation Precautions:  Precautions Precautions: Fall, Other (comment) Precaution Comments: Jp drain x2 Right side / ostomy L side Required Braces or Orthoses: Other Brace Other Brace: ted hose, abdominal binder is optional if pt wants it for comfort or not (cannot be wrapped tightly enough to assist with BP  management) Restrictions Weight Bearing Restrictions: No General:   Vital Signs:   Pain:   ADL: ADL Eating: Independent Where Assessed-Eating: Wheelchair Grooming: Setup Where Assessed-Grooming: Wheelchair Upper Body Bathing: Supervision/safety Where Assessed-Upper Body Bathing: Wheelchair Lower Body Bathing: Dependent Where Assessed-Lower Body Bathing: Wheelchair, Sitting at sink, Standing at sink Upper Body Dressing: Supervision/safety Where Assessed-Upper Body Dressing: Wheelchair Lower Body Dressing: Dependent Where Assessed-Lower Body Dressing: Wheelchair Toileting: Dependent Where Assessed-Toileting: Recruitment consultant Transfer: Dependent Armed forces technical officer Method: Arts development officer: Radiographer, therapeutic: Not assessed Social research officer, government: Not assessed Vision   Perception    Praxis   Exercises:   Other Treatments:     Therapy/Group: Individual Therapy  Tonny Branch 12/03/2019, 1:46 PM

## 2019-12-03 NOTE — Progress Notes (Signed)
Area to buttocks is blanchable with intact skin, removed stage I from assessment.

## 2019-12-03 NOTE — Progress Notes (Signed)
[place] PHYSICAL MEDICINE & REHABILITATION PROGRESS NOTE   Subjective/Complaints: In bed, nurse changing dressings. In good spirits today. No new issues  ROS: Patient denies fever, rash, sore throat, blurred vision, nausea, vomiting, diarrhea, cough, shortness of breath or chest pain, joint or back pain, headache, or mood change.   Objective:   No results found. No results for input(s): WBC, HGB, HCT, PLT in the last 72 hours. No results for input(s): NA, K, CL, CO2, GLUCOSE, BUN, CREATININE, CALCIUM in the last 72 hours.  Intake/Output Summary (Last 24 hours) at 12/03/2019 1033 Last data filed at 12/03/2019 0900 Gross per 24 hour  Intake 874 ml  Output 167.5 ml  Net 706.5 ml     Physical Exam: Vital Signs Blood pressure 98/61, pulse 89, temperature 98.2 F (36.8 C), resp. rate 17, height 5' 1"  (1.549 m), weight 47.3 kg, last menstrual period 12/15/2014, SpO2 95 %. Constitutional: No distress . Vital signs reviewed. HEENT: EOMI, oral membranes moist Neck: supple Cardiovascular: RRR without murmur. No JVD    Respiratory/Chest: CTA Bilaterally without wheezes or rales. Normal effort    GI/Abdomen: BS +, tender, non-distended Ext: no clubbing, cyanosis, or edema Psych: pleasant and cooperative Skin: abdominal wound with pink granulation and retention sutures--just dressed.  JP drains still with minimal drainage with blood tinged in one JP Neuro: Pt is cognitively appropriate with normal insight, memory, and awareness. Cranial nerves 2-12 are intact. Sensory exam is normal. Reflexes are 2+ in all 4's. Fine motor coordination is intact. No tremors. Motor function is grossly 4-/5 UE prox to 4/5 disally. LE:  2+ to 3-/5 HF, 3/5 KE, 4/5 ADF/PF.  Musculoskeletal: Full ROM, No pain with AROM or PROM in the neck, trunk, or extremities.    Assessment/Plan: 1. Functional deficits secondary to debility which require 3+ hours per day of interdisciplinary therapy in a comprehensive inpatient  rehab setting.  Physiatrist is providing close team supervision and 24 hour management of active medical problems listed below.  Physiatrist and rehab team continue to assess barriers to discharge/monitor patient progress toward functional and medical goals  Care Tool:  Bathing    Body parts bathed by patient: Right arm, Left arm, Chest, Abdomen, Face   Body parts bathed by helper: Front perineal area, Buttocks Body parts n/a: Front perineal area, Buttocks (did not attempt, secondary to completing earlier with nursing)   Bathing assist Assist Level: Supervision/Verbal cueing     Upper Body Dressing/Undressing Upper body dressing   What is the patient wearing?: Pull over shirt    Upper body assist Assist Level: Set up assist    Lower Body Dressing/Undressing Lower body dressing      What is the patient wearing?: Pants     Lower body assist Assist for lower body dressing: Maximal Assistance - Patient 25 - 49%     Toileting Toileting    Toileting assist Assist for toileting: Moderate Assistance - Patient 50 - 74%     Transfers Chair/bed transfer  Transfers assist     Chair/bed transfer assist level: Moderate Assistance - Patient 50 - 74%     Locomotion Ambulation   Ambulation assist   Ambulation activity did not occur: Safety/medical concerns  Assist level: Contact Guard/Touching assist Assistive device: Walker-rolling Max distance: 78 ft   Walk 10 feet activity   Assist  Walk 10 feet activity did not occur: Safety/medical concerns  Assist level: Contact Guard/Touching assist Assistive device: Walker-rolling   Walk 50 feet activity   Assist Walk  50 feet with 2 turns activity did not occur: Safety/medical concerns  Assist level: Contact Guard/Touching assist Assistive device: Walker-rolling    Walk 150 feet activity   Assist Walk 150 feet activity did not occur: Safety/medical concerns         Walk 10 feet on uneven surface   activity   Assist Walk 10 feet on uneven surfaces activity did not occur: Safety/medical concerns   Assist level: Minimal Assistance - Patient > 75% Assistive device: Aeronautical engineer Will patient use wheelchair at discharge?: Yes Type of Wheelchair: Manual    Wheelchair assist level: Set up assist Max wheelchair distance: 150 ft    Wheelchair 50 feet with 2 turns activity    Assist        Assist Level: Set up assist   Wheelchair 150 feet activity     Assist      Assist Level: Set up assist   Blood pressure 98/61, pulse 89, temperature 98.2 F (36.8 C), resp. rate 17, height 5' 1"  (1.549 m), weight 47.3 kg, last menstrual period 12/15/2014, SpO2 95 %.  Medical Problem List and Plan: 1.  Debility secondary to Crohn's disease/diverticulitis status post exploratory laparotomy with Hartman's procedure 8/67/6720 complicated by fascial dehiscence mesenteric hematoma pelvic abscess and mucocutaneous separation of colostomy status post exploratory laparotomy colostomy revision drain placement and wound VAC 12/05/7094 further complicated by development of intra-abdominal fluid collection status post right lower quadrant drain placement 11/11/2019 per interventional radiology Dr. Kathlene Cote.                -patient may not yet shower d/t abdominal wounds, drains             -ELOS/Goals: 9/10--may move up date given pt's emotional fatigue from being in hospital. Will depend on fxnl/medical status. Team is discussing  -Continue CIR 2.  Antithrombotics: -DVT/anticoagulation: Lovenox             -antiplatelet therapy: N/A 3. Pain Management: Neurontin 200 mg twice daily, Lidoderm patch as directed, Robaxin 1000 mg every 8 hours, tramadol as needed. Well controlled- changed tylenol to as needed. Abdominal pain and nausea-  PRN zofran. improved.  4. Mood: Lexapro 10 mg daily, melatonin 3 mg nightly as needed. Mood is good. Sleeping well.               -antipsychotic agents: N/A. Lavender aromatherapy  - neuropsych asked to see to help with reactive depression/coping. She feels that lexapro has helped her. 5. Neuropsych: This patient is capable of making decisions on her own behalf. 6. Skin/Wound Care: continue local care to abdominal wounds  -JP's with scant drainage  -one still with old blood  -continues on abx as below 7. Fluids/Electrolytes/Nutrition: encourage po with high protein given protein malnutrition  Eating very well  -continue protein supp for low albumin  8.  ID/intra-abdominal abscess.  Currently maintained on Cipro 500 mg twice daily  -wbc's  -normal on 8/27---recheck tomorrow  -continue to monitor 9.  Acute blood loss anemia.  Hgb stable on 8/27. Recheck tomorrow 10.  Hypotension.     -  Continue TED stockings  -abdominal binder doesn't do much given that it can't be wrapped tightly around drains/abd wound. Can wear if it helps with comfort.  -  Tramadol after therapy so it does not drop her BP as much   -8/26: continue midodrine trial when up with therapy 97m bid  8/31 increased midodrine to 185m--> orthostatics look  better although bp overall is still soft 11. Hypokalemia: 4.3  8/24    - supplement decreased to 44mq daily   -check labs Friday 12. Crohn's disease: continue prednisone 2108mdaily    LOS: 10 days A FACE TO FACE EVALUATION WAS PERFORMED  ZaMeredith Staggers/05/2019, 10:33 AM

## 2019-12-03 NOTE — Progress Notes (Signed)
Physical Therapy Session Note  Patient Details  Name: Natasha Barrera MRN: [id number] Date of Birth: 01-06-1966  Today's Date: 12/03/2019 PT Individual Time: 0900-0955 PT Individual Time Calculation (min): 55 min   Short Term Goals: Week 2:  PT Short Term Goal 1 (Week 2): Pt will complete car transfer with mod assist & LRAD. PT Short Term Goal 2 (Week 2): Pt will ambulate 75 ft with LRAD & min assist. PT Short Term Goal 3 (Week 2): Pt will complete furniture transfer with [place].  Skilled Therapeutic Interventions/Progress Updates:    Patient received supine in bed agreeable to PT. She reports 5/10 pain at abdominal incision (premedicated). Patient given multiple rest breaks and task modifications throughout to assist with pain management. She was able to transfer to wc with CGA and SBT. Patient requesting to use restroom and was able to transfer to toilet via SBT and CGA. Patient SPV for clothing management while seated on toilet. Patient completing 6x1 min on Kinetron at 40cm/sec with 1 min rest in between for improved posterior chain strengthening. Patient transferring to therapy mat via SBT with SBA. Completing the following therex:  2x10 hooklying bridges, x10 U LE bridging. Patient then completing STS from [facility] with intermittent B UE support and CGA. Patient requires verbal cues to engage glutes and not rely on pulling up with UE. Patient fatigues quickly with this task requiring multiple rest breaks throughout. Patient returning to room in wc with seatbelt alarm in place, call light within reach.   Therapy Documentation Precautions:  Precautions Precautions: Fall, Other (comment) Precaution Comments: Jp drain x2 Right side / ostomy L side Required Braces or Orthoses: Other Brace Other Brace: ted hose, abdominal binder is optional if pt wants it for comfort or not (cannot be wrapped tightly enough to assist with BP management) Restrictions Weight Bearing Restrictions:  No    Therapy/Group: Individual Therapy  Karoline Caldwell, PT, DPT, CBIS 12/03/2019, 7:44 AM

## 2019-12-04 ENCOUNTER — Inpatient Hospital Stay (HOSPITAL_COMMUNITY): Payer: 59

## 2019-12-04 ENCOUNTER — Inpatient Hospital Stay (HOSPITAL_COMMUNITY): Payer: 59 | Admitting: Physical Therapy

## 2019-12-04 LAB — CBC
HCT: 25.6 % — ABNORMAL LOW (ref 36.0–46.0)
Hemoglobin: 8.1 g/dL — ABNORMAL LOW (ref 12.0–15.0)
MCH: 32.8 pg (ref 26.0–34.0)
MCHC: 31.6 g/dL (ref 30.0–36.0)
MCV: 103.6 fL — ABNORMAL HIGH (ref 80.0–100.0)
Platelets: 502 10*3/uL — ABNORMAL HIGH (ref 150–400)
RBC: 2.47 MIL/uL — ABNORMAL LOW (ref 3.87–5.11)
RDW: 18.4 % — ABNORMAL HIGH (ref 11.5–15.5)
WBC: 6.7 10*3/uL (ref 4.0–10.5)
nRBC: 0 % (ref 0.0–0.2)

## 2019-12-04 LAB — BASIC METABOLIC PANEL
Anion gap: 11 (ref 5–15)
BUN: 11 mg/dL (ref 6–20)
CO2: 24 mmol/L (ref 22–32)
Calcium: 8.5 mg/dL — ABNORMAL LOW (ref 8.9–10.3)
Chloride: 102 mmol/L (ref 98–111)
Creatinine, Ser: 0.48 mg/dL (ref 0.44–1.00)
GFR calc Af Amer: >60 mL/min (ref >60)
GFR calc non Af Amer: >60 mL/min (ref >60)
Glucose, Bld: 107 mg/dL — ABNORMAL HIGH (ref 70–99)
Potassium: 3.2 mmol/L — ABNORMAL LOW (ref 3.5–5.1)
Sodium: 137 mmol/L (ref 135–145)

## 2019-12-04 NOTE — Progress Notes (Signed)
Occupational Therapy Session Note  Patient Details  Name: Natasha Barrera MRN: [id number] Date of Birth: Nov 26, 1965  Today's Date: 12/04/2019 OT Individual Time: 1400-1500 OT Individual Time Calculation (min): 60 min    Short Term Goals: Week 1:  OT Short Term Goal 1 (Week 1): Pt will complete LB bathing sit to stand with mod assist for two consecutive sessions. OT Short Term Goal 1 - Progress (Week 1): Met OT Short Term Goal 2 (Week 1): Pt will complete LB dressing with mod assist sit to stand for two consecutive sessions. OT Short Term Goal 2 - Progress (Week 1): Met OT Short Term Goal 3 (Week 1): Pt will complete toilet transfers stand pivot with use of the RW and mod assist. OT Short Term Goal 3 - Progress (Week 1): Met OT Short Term Goal 4 (Week 1): Pt will increase endurance by being able to stand at the sink with mod assist for greater than 2 mins during 1 grooming task. OT Short Term Goal 4 - Progress (Week 1): Met  Skilled Therapeutic Interventions/Progress Updates:    1:1. Pt received in bed agreeable to OT. Pt changes shirt and dons shoes at EOB with set up.  Pt completes SB transfer from EOB>w/c>DABSC with S from bed and CGA to [place].Pt able to use lateral leans with S for CM prior to bladder void. Pt completes hand hygiene at sink and agreeable to going outside. Pt propels w/c to outside courtyard with S over uneven terrain and up ramp for BUE strengthening with A to prevent pt from moving backwards. Pt completes uneven functional mobliyt with RW and MOD A for sit to stand and CGA once up with VC for looking forward. edu re energy conservation/transporting items with walker bag to tray at home to increase independence with bag provided and pictures of recommended trays. Pt verbalized understanding. Exited sessionw iht pt sate din bed, exi talram on and call light in reach  Therapy Documentation Precautions:  Precautions Precautions: Fall, Other (comment) Precaution Comments: Jp  drain x2 Right side / ostomy L side Required Braces or Orthoses: Other Brace Other Brace: ted hose, abdominal binder is optional if pt wants it for comfort or not (cannot be wrapped tightly enough to assist with BP management) Restrictions Weight Bearing Restrictions: No General:   Vital Signs: Therapy Vitals Temp: 98.4 F (36.9 C) Pulse Rate: 86 Resp: 16 BP: (!) 93/56 Patient Position (if appropriate): Lying Oxygen Therapy SpO2: 97 % O2 Device: Room Air Pain:   ADL: ADL Eating: Independent Where Assessed-Eating: Wheelchair Grooming: Setup Where Assessed-Grooming: Clinical biochemist Bathing: Supervision/safety Where Assessed-Upper Body Bathing: Wheelchair Lower Body Bathing: Dependent Where Assessed-Lower Body Bathing: Wheelchair, Sitting at sink, Standing at sink Upper Body Dressing: Supervision/safety Where Assessed-Upper Body Dressing: Wheelchair Lower Body Dressing: Dependent Where Assessed-Lower Body Dressing: Wheelchair Toileting: Dependent Where Assessed-Toileting: Recruitment consultant Transfer: Dependent Armed forces technical officer Method: Arts development officer: Radiographer, therapeutic: Not assessed Social research officer, government: Not assessed Vision   Perception    Praxis   Exercises:   Other Treatments:     Therapy/Group: Individual Therapy  Tonny Branch 12/04/2019, 3:02 PM

## 2019-12-04 NOTE — Progress Notes (Addendum)
Physical Therapy Session Note  Patient Details  Name: Natasha Barrera MRN: [id number] Date of Birth: 1965/05/27  Today's Date: 12/04/2019 PT Individual Time: 0800-0911 PT Individual Time Calculation (min): 71 min   Short Term Goals: Week 2:  PT Short Term Goal 1 (Week 2): Pt will complete car transfer with mod assist & LRAD. PT Short Term Goal 2 (Week 2): Pt will ambulate 75 ft with LRAD & min assist. PT Short Term Goal 3 (Week 2): Pt will complete furniture transfer with [place].  Skilled Therapeutic Interventions/Progress Updates:    Patient received sitting EOB agreeable to PT. She reports 3/10 pain in surgical incision, but states that she declined pain rx because it makes her too sleepy. PT provided patient with extended rest breaks for pain management throughout session. Patient able to propel herself in wc ModI to therapy gym. She was able to ambulate 33f, 1322f 16459fith FWW and SPV-CGA. Slow gait speed maintained and verbal cues needed to release high guard from B shoulders. Patient attempting step ups onto 3" box in // bars with MinA-ModA + B knee blocking. Heavy reliance on B UE noted. Patient unable to complete step up onto 6" box d/t LE weakness/fatigue. Patient completing sidelying clamshells with tan theraband 2x10 B, hooklying hip abduction with tan theraband x20, hip bridges with tan theraband to engage hip stabilizers 4x5. Patient returning to room in wc, seatbelt alarm in place, call light within reach.   Therapy Documentation Precautions:  Precautions Precautions: Fall, Other (comment) Precaution Comments: Jp drain x2 Right side / ostomy L side Required Braces or Orthoses: Other Brace Other Brace: ted hose, abdominal binder is optional if pt wants it for comfort or not (cannot be wrapped tightly enough to assist with BP management) Restrictions Weight Bearing Restrictions: No    Therapy/Group: Individual Therapy  JenKaroline CaldwellT,  DPT, CBIS 12/04/2019, 7:39 AM

## 2019-12-04 NOTE — Progress Notes (Addendum)
[place] PHYSICAL MEDICINE & REHABILITATION PROGRESS NOTE   Subjective/Complaints: No new complaints. A little sore after being up with therapy. No ostomy leaks.   ROS: Patient denies fever, rash, sore throat, blurred vision, nausea, vomiting, diarrhea, cough, shortness of breath or chest pain, joint or back pain, headache, or mood change.    Objective:   No results found. No results for input(s): WBC, HGB, HCT, PLT in the last 72 hours. No results for input(s): NA, K, CL, CO2, GLUCOSE, BUN, CREATININE, CALCIUM in the last 72 hours.  Intake/Output Summary (Last 24 hours) at 12/04/2019 1151 Last data filed at 12/04/2019 0728 Gross per 24 hour  Intake 780 ml  Output 15 ml  Net 765 ml     Physical Exam: Vital Signs Blood pressure 103/62, pulse 96, temperature 99.6 F (37.6 C), resp. rate 16, height 5' 1"  (1.549 m), weight 47.3 kg, last menstrual period 12/15/2014, SpO2 96 %. Constitutional: No distress . Vital signs reviewed. HEENT: EOMI, oral membranes moist Neck: supple Cardiovascular: RRR without murmur. No JVD    Respiratory/Chest: CTA Bilaterally without wheezes or rales. Normal effort    GI/Abdomen: BS +, non-tender, non-distended Ext: no clubbing, cyanosis, or edema Psych: pleasant and cooperative Skin: abdominal wound with pink granulation and retention sutures--closing.  JP drain with sl more tan-colored fluid, the other still with minimal  blood tinged drainage. ?sl odor Neuro: Pt is cognitively appropriate with normal insight, memory, and awareness. Cranial nerves 2-12 are intact. Sensory exam is normal. Reflexes are 2+ in all 4's. Fine motor coordination is intact. No tremors. Motor function is grossly 4-/5 UE prox to 4/5 disally. LE:  2+ to 3-/5 HF, 3/5 KE, 4/5 ADF/PF.  Musculoskeletal: Full ROM, No pain with AROM or PROM in the neck, trunk, or extremities.    Assessment/Plan: 1. Functional deficits secondary to debility which require 3+ hours per day of  interdisciplinary therapy in a comprehensive inpatient rehab setting.  Physiatrist is providing close team supervision and 24 hour management of active medical problems listed below.  Physiatrist and rehab team continue to assess barriers to discharge/monitor patient progress toward functional and medical goals  Care Tool:  Bathing    Body parts bathed by patient: Right arm, Left arm, Chest, Abdomen, Face   Body parts bathed by helper: Front perineal area, Buttocks Body parts n/a: Front perineal area, Buttocks (did not attempt, secondary to completing earlier with nursing)   Bathing assist Assist Level: Supervision/Verbal cueing     Upper Body Dressing/Undressing Upper body dressing   What is the patient wearing?: Pull over shirt    Upper body assist Assist Level: Set up assist    Lower Body Dressing/Undressing Lower body dressing      What is the patient wearing?: Pants     Lower body assist Assist for lower body dressing: Maximal Assistance - Patient 25 - 49%     Toileting Toileting    Toileting assist Assist for toileting: Moderate Assistance - Patient 50 - 74%     Transfers Chair/bed transfer  Transfers assist     Chair/bed transfer assist level: Supervision/Verbal cueing Chair/bed transfer assistive device: Sliding board   Locomotion Ambulation   Ambulation assist   Ambulation activity did not occur: Safety/medical concerns  Assist level: Contact Guard/Touching assist Assistive device: Walker-rolling Max distance: 80   Walk 10 feet activity   Assist  Walk 10 feet activity did not occur: Safety/medical concerns  Assist level: Contact Guard/Touching assist Assistive device: Walker-rolling   Walk  50 feet activity   Assist Walk 50 feet with 2 turns activity did not occur: Safety/medical concerns  Assist level: Total Assistance - Patient < 25% Assistive device: Walker-rolling    Walk 150 feet activity   Assist Walk 150 feet activity  did not occur: Safety/medical concerns         Walk 10 feet on uneven surface  activity   Assist Walk 10 feet on uneven surfaces activity did not occur: Safety/medical concerns   Assist level: Minimal Assistance - Patient > 75% Assistive device: Aeronautical engineer Will patient use wheelchair at discharge?: Yes Type of Wheelchair: Manual    Wheelchair assist level: Set up assist Max wheelchair distance: 150 ft    Wheelchair 50 feet with 2 turns activity    Assist        Assist Level: Set up assist   Wheelchair 150 feet activity     Assist      Assist Level: Set up assist   Blood pressure 103/62, pulse 96, temperature 99.6 F (37.6 C), resp. rate 16, height 5' 1"  (1.549 m), weight 47.3 kg, last menstrual period 12/15/2014, SpO2 96 %.  Medical Problem List and Plan: 1.  Debility secondary to Crohn's disease/diverticulitis status post exploratory laparotomy with Hartman's procedure 5/78/4696 complicated by fascial dehiscence mesenteric hematoma pelvic abscess and mucocutaneous separation of colostomy status post exploratory laparotomy colostomy revision drain placement and wound VAC 05/11/5282 further complicated by development of intra-abdominal fluid collection status post right lower quadrant drain placement 11/11/2019 per interventional radiology Dr. Kathlene Cote.                -patient may not yet shower d/t abdominal wounds, drains             -ELOS/Goals: 9/10--may move up date given pt's emotional fatigue from being in hospital. Will depend on fxnl/medical status. Team is discussing  -Continue CIR 2.  Antithrombotics: -DVT/anticoagulation: Lovenox             -antiplatelet therapy: N/A 3. Pain Management: Neurontin 200 mg twice daily, Lidoderm patch as directed, Robaxin 1000 mg every 8 hours, tramadol as needed. Well controlled- changed tylenol to as needed. Abdominal pain and nausea-  PRN zofran. improved.  4. Mood: Lexapro 10 mg  daily, melatonin 3 mg nightly as needed. Mood is good. Sleeping well.              -antipsychotic agents: N/A. Lavender aromatherapy  - neuropsych asked to see to help with reactive depression/coping. She feels that lexapro has helped her. 5. Neuropsych: This patient is capable of making decisions on her own behalf. 6. Skin/Wound Care: continue local care to abdominal wounds  -see JP drains below 7. Fluids/Electrolytes/Nutrition: encourage po with high protein given protein malnutrition  Eating very well  -continue protein supp for low albumin  8.  ID/intra-abdominal abscess.   Cipro 500 mg twice daily ended 8/25  9/3-wbc's  -normal on 8/27---labs STILL pending today  -has low grade temp. ?sl odor from drain fluid  -will ask surgery to lay eyes on him again  -abdominal wound itself looks good 9.  Acute blood loss anemia.  Hgb stable on 8/27. Labs pending 10.  Hypotension.     -  Continue TED stockings  -abdominal binder doesn't do much given that it can't be wrapped tightly around drains/abd wound. Can wear if it helps with comfort.  -  Tramadol after therapy so it does not drop her BP  as much   -9/3 increase of midodrine to 79m bid has helped orthostatics and symptoms. bp still soft 11. Hypokalemia: 4.3  8/24    - supplement decreased to 266m daily   -awaiting labs today 12. Crohn's disease: continue prednisone 2029maily    LOS: 11 days A FACE TO FACE EVALUATION WAS PERFORMED  ZacMeredith Staggers3/2021, 11:51 AM

## 2019-12-04 NOTE — Progress Notes (Signed)
Physical Therapy Session Note  Patient Details  Name: Natasha Barrera MRN: [id number] Date of Birth: January 30, 1966  Today's Date: 12/04/2019 PT Individual Time: 4496-7591 and [id number]  PT Individual Time Calculation (min): 26 min and 26 min  Short Term Goals: Week 2:  PT Short Term Goal 1 (Week 2): Pt will complete car transfer with mod assist & LRAD. PT Short Term Goal 2 (Week 2): Pt will ambulate 75 ft with LRAD & min assist. PT Short Term Goal 3 (Week 2): Pt will complete furniture transfer with [place].  Skilled Therapeutic Interventions/Progress Updates:  Treatment 1: Pt received in bed & agreeable to tx. Supine<>sit with mod I with pt assisting BLE on/off of bed with BUE & HOB elevated. Provided pt with HEP & pt performed the following BLE strengthening exercises with PT providing instructional cuing for technique: seated LAQ, seated hip flexion, supine quad sets, and short arc quads. At end of session pt left in bed with alarm set, call bell & all needs in reach. Pain: no c/o pain  Treatment 2: Pt received in bed & agreeable to tx. Pt performs the remaining exercises on HEP, which include: heel slides, hip abduction, and bridging with PT providing cuing for technique & supporting BLE to prevent sliding when pt attempts to bridge. Bed mobility with mod I & pt dons tennis shoes sitting EOB with mod I. Sit>stand from EOB with mod assist x 1st transfer, min assist for 2nd transfer, & RW. Gait x 160 ft + 140 ft with CGA with cuing to lower shoulders & pt demonstrating decreased gait speed. Pt doffs shoes mod I. Pt left in bed with alarm set, call bell & all needs in reach. Asked pt to look at schedule with husband & plan for him to come in for a session prior to d/c. Pain: notes unrated abdominal soreness that increases with transfers - rest breaks provided PRN  Therapy Documentation Precautions:  Precautions Precautions: Fall, Other (comment) Precaution Comments: Jp drain x2 Right  side / ostomy L side Required Braces or Orthoses: Other Brace Other Brace: ted hose, abdominal binder is optional if pt wants it for comfort or not (cannot be wrapped tightly enough to assist with BP management) Restrictions Weight Bearing Restrictions: No    Therapy/Group: Individual Therapy  [name] 12/04/2019, 4:06 PM

## 2019-12-05 MED ORDER — POTASSIUM CHLORIDE CRYS ER 20 MEQ PO TBCR
20.0000 meq | EXTENDED_RELEASE_TABLET | Freq: Two times a day (BID) | ORAL | Status: DC
  Administered 2019-12-05 – 2019-12-09 (×9): 20 meq via ORAL
  Filled 2019-12-05 (×9): qty 1

## 2019-12-05 MED ORDER — FERROUS SULFATE 325 (65 FE) MG PO TABS
325.0000 mg | ORAL_TABLET | Freq: Every day | ORAL | Status: DC
  Administered 2019-12-05 – 2019-12-09 (×5): 325 mg via ORAL
  Filled 2019-12-05 (×5): qty 1

## 2019-12-05 MED ORDER — B COMPLEX-C PO TABS
1.0000 | ORAL_TABLET | Freq: Every day | ORAL | Status: DC
  Administered 2019-12-05 – 2019-12-09 (×5): 1 via ORAL
  Filled 2019-12-05 (×5): qty 1

## 2019-12-05 MED ORDER — POTASSIUM CHLORIDE CRYS ER 20 MEQ PO TBCR
40.0000 meq | EXTENDED_RELEASE_TABLET | Freq: Two times a day (BID) | ORAL | Status: AC
  Administered 2019-12-05 – 2019-12-06 (×2): 40 meq via ORAL
  Filled 2019-12-05 (×2): qty 2

## 2019-12-05 NOTE — Progress Notes (Addendum)
Medial jp drain was found to have suture detached from skin and 10cm outside the abdomen during dressing change. This nurse secured drain with split gauze, anchor created against skin, and safety pinned to shirt to avoid anymore pulling on drain until assessed by MD. This nurse informed PM, RN and charge.

## 2019-12-05 NOTE — Progress Notes (Signed)
[place] PHYSICAL MEDICINE & REHABILITATION PROGRESS NOTE   Subjective/Complaints:   Pt reports occ pain with wounds- Colostomy working well- no constipation.   2 JP drains were emptied already this AM.     ROS: Pt denies SOB, abd pain, CP, N/V/C/D, and vision changes   Objective:   No results found. Recent Labs    12/04/19 1846  WBC 6.7  HGB 8.1*  HCT 25.6*  PLT 502*   Recent Labs    12/04/19 1846  NA 137  K 3.2*  CL 102  CO2 24  GLUCOSE 107*  BUN 11  CREATININE 0.48  CALCIUM 8.5*    Intake/Output Summary (Last 24 hours) at 12/05/2019 1405 Last data filed at 12/05/2019 1000 Gross per 24 hour  Intake 385 ml  Output 315 ml  Net 70 ml     Physical Exam: Vital Signs Blood pressure (!) 108/56, pulse 96, temperature 99 F (37.2 C), resp. rate 16, height 5' 1"  (1.549 m), weight 47.5 kg, last menstrual period 12/15/2014, SpO2 95 %. Constitutional: sitting up in bed- appropriate, good color, NAD HEENT: conjugate gaze Neck: supple Cardiovascular: RRR   Respiratory/Chest: CTA B/L- no W/R/R- good air movement GI/Abdomen: Soft, NT, ND, (+)BS - colostomy draining stool= pink stoma; 2 JP drains- light brown thick liquid in drains- <5cc- was drained this AM Ext: no clubbing, cyanosis, or edema Psych: pleasant and cooperative Skin: abdominal wound with pink granulation and retention sutures--closing.  JP drain with sl more tan-colored fluid, the other still with minimal  blood tinged drainage. No significant change except drains have been basically emptied this AM Neuro:  Ox3 . Cranial nerves 2-12 are intact. Sensory exam is normal. Reflexes are 2+ in all 4's. Fine motor coordination is intact. No tremors. Motor function is grossly 4-/5 UE prox to 4/5 disally. LE:  2+ to 3-/5 HF, 3/5 KE, 4/5 ADF/PF.  Musculoskeletal: Full ROM, No pain with AROM or PROM in the neck, trunk, or extremities.    Assessment/Plan: 1. Functional deficits secondary to debility which require  3+ hours per day of interdisciplinary therapy in a comprehensive inpatient rehab setting.  Physiatrist is providing close team supervision and 24 hour management of active medical problems listed below.  Physiatrist and rehab team continue to assess barriers to discharge/monitor patient progress toward functional and medical goals  Care Tool:  Bathing    Body parts bathed by patient: Right arm, Left arm, Chest, Abdomen, Face   Body parts bathed by helper: Front perineal area, Buttocks Body parts n/a: Front perineal area, Buttocks (did not attempt, secondary to completing earlier with nursing)   Bathing assist Assist Level: Supervision/Verbal cueing     Upper Body Dressing/Undressing Upper body dressing   What is the patient wearing?: Pull over shirt    Upper body assist Assist Level: Set up assist    Lower Body Dressing/Undressing Lower body dressing      What is the patient wearing?: Pants     Lower body assist Assist for lower body dressing: Maximal Assistance - Patient 25 - 49%     Toileting Toileting    Toileting assist Assist for toileting: Moderate Assistance - Patient 50 - 74%     Transfers Chair/bed transfer  Transfers assist     Chair/bed transfer assist level: Set up assist Chair/bed transfer assistive device: Sliding board   Locomotion Ambulation   Ambulation assist   Ambulation activity did not occur: Safety/medical concerns  Assist level: Contact Guard/Touching assist Assistive device: Walker-rolling Max  distance: 160 ft   Walk 10 feet activity   Assist  Walk 10 feet activity did not occur: Safety/medical concerns  Assist level: Contact Guard/Touching assist Assistive device: Walker-rolling   Walk 50 feet activity   Assist Walk 50 feet with 2 turns activity did not occur: Safety/medical concerns  Assist level: Contact Guard/Touching assist Assistive device: Walker-rolling    Walk 150 feet activity   Assist Walk 150 feet  activity did not occur: Safety/medical concerns  Assist level: Contact Guard/Touching assist Assistive device: Walker-rolling    Walk 10 feet on uneven surface  activity   Assist Walk 10 feet on uneven surfaces activity did not occur: Safety/medical concerns   Assist level: Minimal Assistance - Patient > 75% Assistive device: Aeronautical engineer Will patient use wheelchair at discharge?: Yes Type of Wheelchair: Manual    Wheelchair assist level: Set up assist Max wheelchair distance: 150 ft    Wheelchair 50 feet with 2 turns activity    Assist        Assist Level: Set up assist   Wheelchair 150 feet activity     Assist      Assist Level: Set up assist   Blood pressure (!) 108/56, pulse 96, temperature 99 F (37.2 C), resp. rate 16, height 5' 1"  (1.549 m), weight 47.5 kg, last menstrual period 12/15/2014, SpO2 95 %.  Medical Problem List and Plan: 1.  Debility secondary to Crohn's disease/diverticulitis status post exploratory laparotomy with Hartman's procedure 7/51/7001 complicated by fascial dehiscence mesenteric hematoma pelvic abscess and mucocutaneous separation of colostomy status post exploratory laparotomy colostomy revision drain placement and wound VAC 10/04/9447 further complicated by development of intra-abdominal fluid collection status post right lower quadrant drain placement 11/11/2019 per interventional radiology Dr. Kathlene Cote.                -patient may not yet shower d/t abdominal wounds, drains             -ELOS/Goals: 9/10--may move up date given pt's emotional fatigue from being in hospital. Will depend on fxnl/medical status. Team is discussing  -Continue CIR 2.  Antithrombotics: -DVT/anticoagulation: Lovenox             -antiplatelet therapy: N/A 3. Pain Management: Neurontin 200 mg twice daily, Lidoderm patch as directed, Robaxin 1000 mg every 8 hours, tramadol as needed. Well controlled- changed tylenol to as  needed. Abdominal pain and nausea-  PRN zofran. improved.  4. Mood: Lexapro 10 mg daily, melatonin 3 mg nightly as needed. Mood is good. Sleeping well  9/4- sleeping well- con't regimen.              -antipsychotic agents: N/A. Lavender aromatherapy  - neuropsych asked to see to help with reactive depression/coping. She feels that lexapro has helped her. 5. Neuropsych: This patient is capable of making decisions on her own behalf. 6. Skin/Wound Care: continue local care to abdominal wounds  -see JP drains below 7. Fluids/Electrolytes/Nutrition: encourage po with high protein given protein malnutrition  Eating very well  -continue protein supp for low albumin  8.  ID/intra-abdominal abscess.   Cipro 500 mg twice daily ended 8/25  9/3-wbc's  -normal on 8/27---labs STILL pending today  -has low grade temp. ?sl odor from drain fluid  -will ask surgery to lay eyes on her again  -abdominal wound itself looks good  9/4- Labs last night show WBC of 6.7- down from 7.9- stable 9.  Acute blood loss anemia.  Hgb stable on 8/27. Labs pending 10.  Hypotension.     -  Continue TED stockings  -abdominal binder doesn't do much given that it can't be wrapped tightly around drains/abd wound. Can wear if it helps with comfort.  -  Tramadol after therapy so it does not drop her BP as much   -9/3 increase of midodrine to 58m bid has helped orthostatics and symptoms. bp still soft 11. Hypokalemia: 4.3  8/24    - supplement decreased to 251m daily   -awaiting labs today  9/4- K+ down to 3.2- will replete again with 40 mEq x2 and recheck Monday 12. Crohn's disease: continue prednisone 2081maily    LOS: 12 days A FACE TO FACE EVALUATION WAS PERFORMED  Natasha Barrera 12/05/2019, 2:05 PM

## 2019-12-06 ENCOUNTER — Inpatient Hospital Stay (HOSPITAL_COMMUNITY): Payer: 59

## 2019-12-06 NOTE — Plan of Care (Signed)
  Problem: RH BOWEL ELIMINATION Goal: RH STG MANAGE BOWEL WITH ASSISTANCE Description: STG Manage Bowel with Assistance. Outcome: Progressing   Problem: RH BLADDER ELIMINATION Goal: RH STG MANAGE BLADDER WITH ASSISTANCE Description: STG Manage Bladder With Assistance Outcome: Progressing Goal: RH STG MANAGE BLADDER WITH EQUIPMENT WITH ASSISTANCE Description: STG Manage Bladder With Equipment With Assistance Outcome: Progressing   Problem: RH SKIN INTEGRITY Goal: RH STG SKIN FREE OF INFECTION/BREAKDOWN Outcome: Progressing Goal: RH STG MAINTAIN SKIN INTEGRITY WITH ASSISTANCE Description: STG Maintain Skin Integrity With Assistance. Outcome: Progressing Goal: RH STG ABLE TO PERFORM INCISION/WOUND CARE W/ASSISTANCE Description: STG Able To Perform Incision/Wound Care With Assistance. Outcome: Progressing   Problem: RH SAFETY Goal: RH STG ADHERE TO SAFETY PRECAUTIONS W/ASSISTANCE/DEVICE Description: STG Adhere to Safety Precautions With Assistance/Device. Outcome: Progressing   Problem: RH PAIN MANAGEMENT Goal: RH STG PAIN MANAGED AT OR BELOW PT'S PAIN GOAL Outcome: Progressing   Problem: RH KNOWLEDGE DEFICIT GENERAL Goal: RH STG INCREASE KNOWLEDGE OF SELF CARE AFTER HOSPITALIZATION Outcome: Progressing   Problem: Consults Goal: RH GENERAL PATIENT EDUCATION Description: See Patient Education module for education specifics. Outcome: Progressing

## 2019-12-06 NOTE — Progress Notes (Signed)
MD up to assess patient's drain.

## 2019-12-06 NOTE — Progress Notes (Signed)
Rt sided surgical JP drain essentially out. No longer maintaining suction.  Pt reports minimal output in that drain for days Drain removed.  Expect some scant drainage from old drain site for a few days Cover with dry gauze and change as needed  Leighton Ruff. Redmond Pulling, MD, FACS General, Bariatric, & Minimally Invasive Surgery [facility], [place]

## 2019-12-06 NOTE — Progress Notes (Signed)
Physical Therapy Session Note  Patient Details  Name: Natasha Barrera MRN: [id number] Date of Birth: 1965/08/08  Today's Date: 12/06/2019 PT Individual Time: 1350-1445 PT Individual Time Calculation (min): 55 min   Short Term Goals: Week 2:  PT Short Term Goal 1 (Week 2): Pt will complete car transfer with mod assist & LRAD. PT Short Term Goal 2 (Week 2): Pt will ambulate 75 ft with LRAD & min assist. PT Short Term Goal 3 (Week 2): Pt will complete furniture transfer with [place].  Skilled Therapeutic Interventions/Progress Updates:     Patient in bed upon PT arrival. Patient alert and agreeable to PT session. Patient reported that 1 JP drain was removed this morning and reported  5-6/10 abdominal pain with spasms during session, RN made aware. PT provided repositioning, rest breaks, and distraction as pain interventions throughout session.   Therapeutic Activity: Bed Mobility: Patient performed supine to/from sit with supervision with HOB elevated to simulate home set-up with wedge pillow for comfort.  Transfers: Patient attempted sit to/from stand x3 and was unable to successfully come to standing at beginning of session. She then performed sit to/from stand x2 and stand pivot x2 following NMR with min A-CGA using the RW. Provided verbal cues for foot placement, hand placement, forward weight shift, and trunk, knee, and hip extension to stand. Patient performed slide board transfers w/c>mat table and w/c>bed with supervision-mod I including set-up and board placement. Provided cues for moving arm rest back x1.  Gait Training:  Patient ambulated 216 feet and 45 feet using RW with CGA. Ambulated with decreased gait speed, decreased step length and height, mild forward trunk lean, and increased UE use with elevated shoulders. Provided verbal cues for erect posture with shoulder depression, increased step height for safety, and pacing for increased activity tolerance. Second trial  limited by onset of abdominal spasms.   Wheelchair Mobility:  Patient propelled wheelchair >150 feet with supervision-mod I. Provided verbal cues for turning technique x1. Propels with decreased speed due to UE weakness and abdominal pain.   Neuromuscular Re-ed: Patient performed the following motor control activities for improved balance and functional mobility: -pre-stand focused on forward weight shift onto her feet and lifting hips off the mat x4 -Sit to/from stand from elevated mat table: 23" x5, 21" x5, 19" x5 using a RW with CGA and intermittent facilitation for forward weight shift  Patient required rest breaks between all activities due to decreased activity tolerance and abdominal spasms. Educated on energy conservation techniques and slow progression for return to daily activities and community integration. Patient attentive and receptive to all education.   Patient in bed at end of session with breaks locked, bed alarm set, and all needs within reach.    Therapy Documentation Precautions:  Precautions Precautions: Fall, Other (comment) Precaution Comments: Jp drain x2 Right side / ostomy L side Required Braces or Orthoses: Other Brace Other Brace: ted hose, abdominal binder is optional if pt wants it for comfort or not (cannot be wrapped tightly enough to assist with BP management) Restrictions Weight Bearing Restrictions: No   Therapy/Group: Individual Therapy  Tabrina Esty L Carmina Walle PT, DPT  12/06/2019, 4:59 PM

## 2019-12-06 NOTE — Progress Notes (Signed)
Occupational Therapy Session Note  Patient Details  Name: Natasha Barrera MRN: [id number] Date of Birth: [birth year]  Today's Date: 12/06/2019 OT Individual Time: 9794-8016 OT Individual Time Calculation (min): 58 min    Short Term Goals: Week 1:  OT Short Term Goal 1 (Week 1): Pt will complete LB bathing sit to stand with mod assist for two consecutive sessions. OT Short Term Goal 1 - Progress (Week 1): Met OT Short Term Goal 2 (Week 1): Pt will complete LB dressing with mod assist sit to stand for two consecutive sessions. OT Short Term Goal 2 - Progress (Week 1): Met OT Short Term Goal 3 (Week 1): Pt will complete toilet transfers stand pivot with use of the RW and mod assist. OT Short Term Goal 3 - Progress (Week 1): Met OT Short Term Goal 4 (Week 1): Pt will increase endurance by being able to stand at the sink with mod assist for greater than 2 mins during 1 grooming task. OT Short Term Goal 4 - Progress (Week 1): Met  Skilled Therapeutic Interventions/Progress Updates:    1;1. Pt recived in bed agreeable to OT. Pt completes SB transfer EOB>w/c MOD I after OT places chair. Pt propels w/c MOD I to all tx destinations for BUE strengthening. Pt completes 6x1 min on 1 min off kinetron seated reciprocal training for BLEs trengthening and endurance. Last 3 rounds completed without back support on w/c at [facility] with anterior flexion in pelvis to isolate glute strength/core strength. Pt completes 3x1 min zoom ball for shoulder strengthenign required for BADLs and transfers. Pt completes bathing UB/LB with set up while OT retrieves DABSC that can be adjusted for height to increase ease of STS with RN staff for toileting. Pt requires total A for hair washing backed up to the sink. Exited session with pt seated in bed, exit alarm on and call light itn reach  Therapy Documentation Precautions:  Precautions Precautions: Fall, Other (comment) Precaution Comments: Jp drain x2 Right side / ostomy L  side Required Braces or Orthoses: Other Brace Other Brace: ted hose, abdominal binder is optional if pt wants it for comfort or not (cannot be wrapped tightly enough to assist with BP management) Restrictions Weight Bearing Restrictions: No General:   Vital Signs: Therapy Vitals Temp: 98.3 F (36.8 C) Pulse Rate: 85 Resp: 16 BP: 107/63 Patient Position (if appropriate): Lying Oxygen Therapy SpO2: 97 % O2 Device: Room Air Pain: Pain Assessment Pain Scale: 0-10 Pain Score: 0-No pain ADL: ADL Eating: Independent Where Assessed-Eating: Wheelchair Grooming: Setup Where Assessed-Grooming: Wheelchair Upper Body Bathing: Supervision/safety Where Assessed-Upper Body Bathing: Wheelchair Lower Body Bathing: Dependent Where Assessed-Lower Body Bathing: Wheelchair, Sitting at sink, Standing at sink Upper Body Dressing: Supervision/safety Where Assessed-Upper Body Dressing: Wheelchair Lower Body Dressing: Dependent Where Assessed-Lower Body Dressing: Wheelchair Toileting: Dependent Where Assessed-Toileting: Recruitment consultant Transfer: Dependent Armed forces technical officer Method: Arts development officer: Radiographer, therapeutic: Not assessed Social research officer, government: Not assessed Vision   Perception    Praxis   Exercises:   Other Treatments:     Therapy/Group: Individual Therapy  Tonny Branch 12/06/2019, 7:57 AM

## 2019-12-06 NOTE — Progress Notes (Signed)
[place] PHYSICAL MEDICINE & REHABILITATION PROGRESS NOTE   Subjective/Complaints:   Pt reports that "1st drain" has come out ~ 2 inches- not suctioned anymore- wondering if should come out of not.   Called surgery- they will come to assess- per note in chart, was removed- was essentially out with very little drainage last few days.      ROS:  Pt denies SOB, abd pain, CP, N/V/C/D, and vision changes  Objective:   No results found. Recent Labs    12/04/19 1846  WBC 6.7  HGB 8.1*  HCT 25.6*  PLT 502*   Recent Labs    12/04/19 1846  NA 137  K 3.2*  CL 102  CO2 24  GLUCOSE 107*  BUN 11  CREATININE 0.48  CALCIUM 8.5*    Intake/Output Summary (Last 24 hours) at 12/06/2019 1323 Last data filed at 12/06/2019 0849 Gross per 24 hour  Intake 2275 ml  Output 230 ml  Net 2045 ml     Physical Exam: Vital Signs Blood pressure 107/63, pulse 85, temperature 98.3 F (36.8 C), resp. rate 16, height 5' 1"  (1.549 m), weight 47 kg, last menstrual period 12/15/2014, SpO2 97 %. Constitutional: sitting up in bed- appropriate, watching TV, NAD HEENT: conjugate gaze Neck: supple Cardiovascular: RRR Respiratory/Chest: CTA B/L- no W/R/R- good air movement GI/Abdomen: Soft, NT, ND, (+)BS  Colostomy almost full- 1 JP drain not holding suction- darkish/greenish ~2cc in bulb- other drain has tan drainage- ~ 3-4cc in it- still suctioned.  Ext: no clubbing, cyanosis, or edema Psych:appropriate Skin: abdominal wound with pink granulation and retention sutures--closing.  JP drain with sl more tan-colored fluid, the other still with minimal  blood tinged drainage. Drains <5cc in each- not emptied this AM Neuro:  Ox3 . Cranial nerves 2-12 are intact. Sensory exam is normal. Reflexes are 2+ in all 4's. Fine motor coordination is intact. No tremors. Motor function is grossly 4-/5 UE prox to 4/5 disally. LE:  2+ to 3-/5 HF, 3/5 KE, 4/5 ADF/PF.  Musculoskeletal: Full ROM, No pain with AROM or  PROM in the neck, trunk, or extremities.    Assessment/Plan: 1. Functional deficits secondary to debility which require 3+ hours per day of interdisciplinary therapy in a comprehensive inpatient rehab setting.  Physiatrist is providing close team supervision and 24 hour management of active medical problems listed below.  Physiatrist and rehab team continue to assess barriers to discharge/monitor patient progress toward functional and medical goals  Care Tool:  Bathing    Body parts bathed by patient: Right arm, Left arm, Chest, Abdomen, Face   Body parts bathed by helper: Front perineal area, Buttocks Body parts n/a: Front perineal area, Buttocks (did not attempt, secondary to completing earlier with nursing)   Bathing assist Assist Level: Supervision/Verbal cueing     Upper Body Dressing/Undressing Upper body dressing   What is the patient wearing?: Pull over shirt    Upper body assist Assist Level: Set up assist    Lower Body Dressing/Undressing Lower body dressing      What is the patient wearing?: Pants     Lower body assist Assist for lower body dressing: Maximal Assistance - Patient 25 - 49%     Toileting Toileting    Toileting assist Assist for toileting: Moderate Assistance - Patient 50 - 74%     Transfers Chair/bed transfer  Transfers assist     Chair/bed transfer assist level: Set up assist Chair/bed transfer assistive device: Sliding board   Locomotion Ambulation  Ambulation assist   Ambulation activity did not occur: Safety/medical concerns  Assist level: Contact Guard/Touching assist Assistive device: Walker-rolling Max distance: 160 ft   Walk 10 feet activity   Assist  Walk 10 feet activity did not occur: Safety/medical concerns  Assist level: Contact Guard/Touching assist Assistive device: Walker-rolling   Walk 50 feet activity   Assist Walk 50 feet with 2 turns activity did not occur: Safety/medical concerns  Assist  level: Contact Guard/Touching assist Assistive device: Walker-rolling    Walk 150 feet activity   Assist Walk 150 feet activity did not occur: Safety/medical concerns  Assist level: Contact Guard/Touching assist Assistive device: Walker-rolling    Walk 10 feet on uneven surface  activity   Assist Walk 10 feet on uneven surfaces activity did not occur: Safety/medical concerns   Assist level: Minimal Assistance - Patient > 75% Assistive device: Aeronautical engineer Will patient use wheelchair at discharge?: Yes Type of Wheelchair: Manual    Wheelchair assist level: Set up assist Max wheelchair distance: 150 ft    Wheelchair 50 feet with 2 turns activity    Assist        Assist Level: Set up assist   Wheelchair 150 feet activity     Assist      Assist Level: Set up assist   Blood pressure 107/63, pulse 85, temperature 98.3 F (36.8 C), resp. rate 16, height 5' 1"  (1.549 m), weight 47 kg, last menstrual period 12/15/2014, SpO2 97 %.  Medical Problem List and Plan: 1.  Debility secondary to Crohn's disease/diverticulitis status post exploratory laparotomy with Hartman's procedure 8/41/3244 complicated by fascial dehiscence mesenteric hematoma pelvic abscess and mucocutaneous separation of colostomy status post exploratory laparotomy colostomy revision drain placement and wound VAC 0/04/270 further complicated by development of intra-abdominal fluid collection status post right lower quadrant drain placement 11/11/2019 per interventional radiology Dr. Kathlene Cote.                -patient may not yet shower d/t abdominal wounds, drains             -ELOS/Goals: 9/10--may move up date given pt's emotional fatigue from being in hospital. Will depend on fxnl/medical status. Team is discussing  -Continue CIR 2.  Antithrombotics: -DVT/anticoagulation: Lovenox             -antiplatelet therapy: N/A 3. Pain Management: Neurontin 200 mg twice daily,  Lidoderm patch as directed, Robaxin 1000 mg every 8 hours, tramadol as needed. Well controlled- changed tylenol to as needed. Abdominal pain and nausea-  PRN zofran. Improved.  9/5- pain well controlled- con't regimen  4. Mood: Lexapro 10 mg daily, melatonin 3 mg nightly as needed. Mood is good. Sleeping well  9/4- sleeping well- con't regimen.              -antipsychotic agents: N/A. Lavender aromatherapy  - neuropsych asked to see to help with reactive depression/coping. She feels that lexapro has helped her. 5. Neuropsych: This patient is capable of making decisions on her own behalf. 6. Skin/Wound Care: continue local care to abdominal wounds  -see JP drains below 7. Fluids/Electrolytes/Nutrition: encourage po with high protein given protein malnutrition  Eating very well  -continue protein supp for low albumin  8.  ID/intra-abdominal abscess.   Cipro 500 mg twice daily ended 8/25  9/3-wbc's  -normal on 8/27---labs STILL pending today  -has low grade temp. ?sl odor from drain fluid  -will ask surgery to lay eyes on  her again  -abdominal wound itself looks good  9/4- Labs last night show WBC of 6.7- down from 7.9- stable 9.  Acute blood loss anemia.  Hgb stable on 8/27. Labs pending 10.  Hypotension.     -  Continue TED stockings  -abdominal binder doesn't do much given that it can't be wrapped tightly around drains/abd wound. Can wear if it helps with comfort.  -  Tramadol after therapy so it does not drop her BP as much   -9/3 increase of midodrine to 67m bid has helped orthostatics and symptoms. bp still soft 11. Hypokalemia: 4.3  8/24    - supplement decreased to 271m daily   -awaiting labs today  9/4- K+ down to 3.2- will replete again with 40 mEq x2 and recheck Monday  9/5- labs tomorrow 12. Crohn's disease: continue prednisone 2052maily   9/5- 1 JP drain from surgery was removed today- since was essentially out already/couldn't maintain suction- surgery removed.    LOS: 13 days A FACE TO FACE EVALUATION WAS PERFORMED  Agam Davenport 12/06/2019, 1:23 PM

## 2019-12-07 ENCOUNTER — Inpatient Hospital Stay (HOSPITAL_COMMUNITY): Payer: 59 | Admitting: Physical Therapy

## 2019-12-07 ENCOUNTER — Inpatient Hospital Stay (HOSPITAL_COMMUNITY): Payer: 59

## 2019-12-07 LAB — COMPREHENSIVE METABOLIC PANEL
ALT: 29 U/L (ref 0–44)
AST: 22 U/L (ref 15–41)
Albumin: 2.4 g/dL — ABNORMAL LOW (ref 3.5–5.0)
Alkaline Phosphatase: 86 U/L (ref 38–126)
Anion gap: 10 (ref 5–15)
BUN: 8 mg/dL (ref 6–20)
CO2: 25 mmol/L (ref 22–32)
Calcium: 8.9 mg/dL (ref 8.9–10.3)
Chloride: 97 mmol/L — ABNORMAL LOW (ref 98–111)
Creatinine, Ser: 0.47 mg/dL (ref 0.44–1.00)
GFR calc Af Amer: >60 mL/min (ref >60)
GFR calc non Af Amer: >60 mL/min (ref >60)
Glucose, Bld: 80 mg/dL (ref 70–99)
Potassium: 3.7 mmol/L (ref 3.5–5.1)
Sodium: 132 mmol/L — ABNORMAL LOW (ref 135–145)
Total Bilirubin: 0.3 mg/dL (ref 0.3–1.2)
Total Protein: 5.7 g/dL — ABNORMAL LOW (ref 6.5–8.1)

## 2019-12-07 LAB — CBC
HCT: 31.5 % — ABNORMAL LOW (ref 36.0–46.0)
Hemoglobin: 9.5 g/dL — ABNORMAL LOW (ref 12.0–15.0)
MCH: 31.4 pg (ref 26.0–34.0)
MCHC: 30.2 g/dL (ref 30.0–36.0)
MCV: 104 fL — ABNORMAL HIGH (ref 80.0–100.0)
Platelets: 649 10*3/uL — ABNORMAL HIGH (ref 150–400)
RBC: 3.03 MIL/uL — ABNORMAL LOW (ref 3.87–5.11)
RDW: 17.9 % — ABNORMAL HIGH (ref 11.5–15.5)
WBC: 8.4 10*3/uL (ref 4.0–10.5)
nRBC: 0 % (ref 0.0–0.2)

## 2019-12-07 LAB — VITAMIN B12: Vitamin B-12: 140 pg/mL — ABNORMAL LOW (ref 180–914)

## 2019-12-07 LAB — IRON AND TIBC
Iron: 32 ug/dL (ref 28–170)
Saturation Ratios: 12 % (ref 10.4–31.8)
TIBC: 259 ug/dL (ref 250–450)
UIBC: 227 ug/dL

## 2019-12-07 LAB — FERRITIN: Ferritin: 330 ng/mL — ABNORMAL HIGH (ref 11–307)

## 2019-12-07 NOTE — Progress Notes (Signed)
[place] PHYSICAL MEDICINE & REHABILITATION PROGRESS NOTE   Subjective/Complaints:   No new issues. Working on leg exercises when I came in room. One JP was pulled yesterday by surgery. Husband coming in for ed today  ROS: Patient denies fever, rash, sore throat, blurred vision, nausea, vomiting, diarrhea, cough, shortness of breath or chest pain,  headache, or mood change.    Objective:   No results found. Recent Labs    12/04/19 1846 12/07/19 0546  WBC 6.7 8.4  HGB 8.1* 9.5*  HCT 25.6* 31.5*  PLT 502* 649*   Recent Labs    12/04/19 1846 12/07/19 0546  NA 137 132*  K 3.2* 3.7  CL 102 97*  CO2 24 25  GLUCOSE 107* 80  BUN 11 8  CREATININE 0.48 0.47  CALCIUM 8.5* 8.9    Intake/Output Summary (Last 24 hours) at 12/07/2019 0909 Last data filed at 12/07/2019 0700 Gross per 24 hour  Intake 460 ml  Output 100 ml  Net 360 ml     Physical Exam: Vital Signs Blood pressure 95/66, pulse 90, temperature 98.2 F (36.8 C), resp. rate 17, height 5' 1"  (1.549 m), weight 46.8 kg, last menstrual period 12/15/2014, SpO2 96 %. Constitutional: No distress . Vital signs reviewed. HEENT: EOMI, oral membranes moist Neck: supple Cardiovascular: RRR without murmur. No JVD    Respiratory/Chest: CTA Bilaterally without wheezes or rales. Normal effort    GI/Abdomen: BS +, non-tender, non-distended Ext: no clubbing, cyanosis, or edema Psych: pleasant and cooperative Skin: abdominal wound with pink granulation and retention sutures--continues to close.  JP drain with   tan-colored fluid, the other out, drain site with scant dc   Neuro:  Ox3 . Cranial nerves 2-12 are intact. Sensory exam is normal. Reflexes are 2+ in all 4's. Fine motor coordination is intact. No tremors. Motor function is grossly 4-/5 UE prox to 4/5 disally. LE:   3-/5 HF, 3+/5 KE, 4/5 ADF/PF.  Musculoskeletal: Full ROM, No pain with AROM or PROM in the neck, trunk, or extremities.    Assessment/Plan: 1. Functional  deficits secondary to debility which require 3+ hours per day of interdisciplinary therapy in a comprehensive inpatient rehab setting.  Physiatrist is providing close team supervision and 24 hour management of active medical problems listed below.  Physiatrist and rehab team continue to assess barriers to discharge/monitor patient progress toward functional and medical goals  Care Tool:  Bathing    Body parts bathed by patient: Right arm, Left arm, Chest, Abdomen, Face   Body parts bathed by helper: Front perineal area, Buttocks Body parts n/a: Front perineal area, Buttocks (did not attempt, secondary to completing earlier with nursing)   Bathing assist Assist Level: Supervision/Verbal cueing     Upper Body Dressing/Undressing Upper body dressing   What is the patient wearing?: Pull over shirt    Upper body assist Assist Level: Set up assist    Lower Body Dressing/Undressing Lower body dressing      What is the patient wearing?: Pants     Lower body assist Assist for lower body dressing: Maximal Assistance - Patient 25 - 49%     Toileting Toileting    Toileting assist Assist for toileting: Moderate Assistance - Patient 50 - 74%     Transfers Chair/bed transfer  Transfers assist     Chair/bed transfer assist level: Minimal Assistance - Patient > 75% Chair/bed transfer assistive device: Programmer, multimedia   Ambulation assist   Ambulation activity did not occur:  Safety/medical concerns  Assist level: Contact Guard/Touching assist Assistive device: Walker-rolling Max distance: 216 ft   Walk 10 feet activity   Assist  Walk 10 feet activity did not occur: Safety/medical concerns  Assist level: Contact Guard/Touching assist Assistive device: Walker-rolling   Walk 50 feet activity   Assist Walk 50 feet with 2 turns activity did not occur: Safety/medical concerns  Assist level: Contact Guard/Touching assist Assistive device:  Walker-rolling    Walk 150 feet activity   Assist Walk 150 feet activity did not occur: Safety/medical concerns  Assist level: Contact Guard/Touching assist Assistive device: Walker-rolling    Walk 10 feet on uneven surface  activity   Assist Walk 10 feet on uneven surfaces activity did not occur: Safety/medical concerns   Assist level: Minimal Assistance - Patient > 75% Assistive device: Aeronautical engineer Will patient use wheelchair at discharge?: Yes Type of Wheelchair: Manual    Wheelchair assist level: Set up assist Max wheelchair distance: 150 ft    Wheelchair 50 feet with 2 turns activity    Assist        Assist Level: Set up assist   Wheelchair 150 feet activity     Assist      Assist Level: Set up assist   Blood pressure 95/66, pulse 90, temperature 98.2 F (36.8 C), resp. rate 17, height 5' 1"  (1.549 m), weight 46.8 kg, last menstrual period 12/15/2014, SpO2 96 %.  Medical Problem List and Plan: 1.  Debility secondary to Crohn's disease/diverticulitis status post exploratory laparotomy with Hartman's procedure 4/48/1856 complicated by fascial dehiscence mesenteric hematoma pelvic abscess and mucocutaneous separation of colostomy status post exploratory laparotomy colostomy revision drain placement and wound VAC 05/31/4968 further complicated by development of intra-abdominal fluid collection status post right lower quadrant drain placement 11/11/2019 per interventional radiology Dr. Kathlene Cote.                -patient may not yet shower d/t abdominal wounds, drains             -ELOS/Goals: 9/10--family ed today. May move up date sl. D/w team tomorrow  -Continue CIR 2.  Antithrombotics: -DVT/anticoagulation: Lovenox             -antiplatelet therapy: N/A 3. Pain Management: Neurontin 200 mg twice daily, Lidoderm patch as directed, Robaxin 1000 mg every 8 hours, tramadol as needed. Well controlled- changed tylenol to as  needed. Abdominal pain and nausea-  PRN zofran. Improved.  9/6- pain well controlled- con't regimen  4. Mood: Lexapro 10 mg daily, melatonin 3 mg nightly as needed. Mood is good. Sleeping well  9/6- sleeping well- con't regimen.              -antipsychotic agents: N/A. Lavender aromatherapy  - lexapro. 5. Neuropsych: This patient is capable of making decisions on her own behalf. 6. Skin/Wound Care: continue local care to abdominal wounds  -see JP drains below 7. Fluids/Electrolytes/Nutrition: encourage po with high protein given protein malnutrition  Eating very well  -continue protein supp for low albumin  8.  ID/intra-abdominal abscess.   Cipro 500 mg twice daily ended 8/25  9/6 labs all stable.   -wounds clean, one JP out per surgery (was already dislodged) 9.  Acute blood loss anemia.  Hgb stable on 8/27. Labs pending 10.  Hypotension.     -  Continue TED stockings  -abdominal binder doesn't do much given that it can't be wrapped tightly around drains/abd wound. Can wear  if it helps with comfort.  -  Tramadol after therapy so it does not drop her BP as much   -9/6 increase of midodrine to 37m bid has helped orthostatics and symptoms. bp remains soft 11. Hypokalemia: 4.3  8/24    9/6 potassium low normal 3.7  -continue supplement 12. Crohn's disease: continue prednisone 259mdaily      LOS: 14 days A FACE TO FACE EVALUATION WAS PERFORMED  ZaMeredith Staggers/09/2019, 9:09 AM

## 2019-12-07 NOTE — Progress Notes (Signed)
Physical Therapy Session Note  Patient Details  Name: Natasha Barrera MRN: [id number] Date of Birth: 1966-01-30  Today's Date: 12/07/2019 PT Individual Time: 7290-2111 PT Individual Time Calculation (min): 42 min   Short Term Goals: Week 2:  PT Short Term Goal 1 (Week 2): Pt will complete car transfer with mod assist & LRAD. PT Short Term Goal 2 (Week 2): Pt will ambulate 75 ft with LRAD & min assist. PT Short Term Goal 3 (Week 2): Pt will complete furniture transfer with [place].  Skilled Therapeutic Interventions/Progress Updates: Pt presents supine in bed and agrees to participate in therapy.  Pt transfers sup to sit w/ side rails and supervision w/ log roll technique to left.  Pt able to don shoes w/ Figure-4 position after set-up.  Pt performed sit to stand w/ min A from elevated bed to simulate height of bed at home.  Pt transfers using RW bed > w.c and  Min to CGA and verbal cues. Pt wheeled self x 100' w/ set-up to gym.  Pt amb short distance in gym to Nu-step where performed 8' and 7' at Level 2 w/ emphasis on LEs, but use of UEs as needed.  Pt amb short distance to w/c.  Multiple sit to stand trials required mod to min A from lower surface and fatigue at end of day.  Pt amb x 170' including return to w/c around loop, then amb to room, approx. 180' w/ CGA.  Pt transferred sit to supine using siderails w/ supervision.  Bed alarm on and all needs in reach.     Therapy Documentation Precautions:  Precautions Precautions: Fall, Other (comment) Precaution Comments: Jp drain x2 Right side / ostomy L side Required Braces or Orthoses: Other Brace Other Brace: ted hose, abdominal binder is optional if pt wants it for comfort or not (cannot be wrapped tightly enough to assist with BP management) Restrictions Weight Bearing Restrictions: No General:   Vital Signs: Therapy Vitals Temp: 97.7 F (36.5 C) Pulse Rate: 92 Resp: 16 BP: 92/60 Patient Position (if appropriate):  Lying Oxygen Therapy SpO2: 98 % O2 Device: Room Air Pain:Pt has no c/o pain.    Therapy/Group: Individual Therapy  Ladoris Gene 12/07/2019, 3:09 PM

## 2019-12-07 NOTE — Progress Notes (Signed)
Patient ID: Natasha Barrera, female   DOB: 26-Nov-1965, 54 y.o.   MRN: [id number]  Per medical team, pt progressing and able to d/c on 9/8. SW met with pt and pt husband in room to discuss d/c recommendations: HHPT/OT/SN (wound care and colostomy education), and DME: RW. Preferred [facility]: 1) [facility] or 2) [facility]. Pt husband prefers to have an agency in network. SW explained process on obtaining HH, and all efforts will be made to ensure [facility] is secured by d/c, if not, SW will continue to follow until able to secure. Pt will need RW. Pt and husband would like to know if she will have Lovenox at d/c, if so would like to have education. SW encouraged pt to speak with attending during rounds tomorrow morning to confirm.  SW sent HHPT/OT/SN referral to [facility]. Referral declined due to staffing.   SW sent referral to [facility]. SW waiting on follow-up.   DME order: RW sent to [facility] via parachute.   Loralee Pacas, MSW, [place] Office: [phone] Cell: [phone] Fax: [phone]

## 2019-12-07 NOTE — Discharge Summary (Signed)
Physician Discharge Summary  Patient ID: Natasha Barrera MRN: [id number] DOB/AGE: Nov 12, 1965 54 y.o.  Admit date: 11/23/2019 Discharge date: 12/09/2019  Discharge Diagnoses:  Principal Problem:   Debility Active Problems:   Protein-calorie malnutrition, severe ([place]) DVT prophylaxis Pain management Mood stabilization Acute blood loss anemia Intra-abdominal abscess Hypotension Crohn's disease Tobacco abuse  Discharged Condition: Stable  Significant Diagnostic Studies: DG Abd 1 View  Result Date: 11/11/2019 CLINICAL DATA:  54 year old female with history of mesenteric dehiscence and hematoma, status post exploratory laparotomy and colostomy revision postoperative day 7. EXAM: ABDOMEN - 1 VIEW COMPARISON:  CT Abdomen and Pelvis 11/04/2019 and earlier. FINDINGS: Portable AP supine view at 0334 hours. Postoperative drain remains in place in the pelvis. Additional horizontal plastic tubes at the abdomen might be related to ventral abdominal closure and/or wound VAC. Nonspecific but nonobstructed appearing bowel gas pattern. No definite pneumoperitoneum on this supine view. Probably regressed pleural effusions from the prior CT. No acute osseous abnormality identified. IMPRESSION: 1. Nonspecific but nonobstructed bowel gas pattern. 2. Percutaneous drain remains in place to the pelvis. Electronically Signed   By: Genevie Ann M.D.   On: 11/11/2019 03:49   CT ABDOMEN PELVIS W CONTRAST  Result Date: 11/21/2019 CLINICAL DATA:  Abdominal abscess, purulence drainage, previous laparotomy EXAM: CT ABDOMEN AND PELVIS WITH CONTRAST TECHNIQUE: Multidetector CT imaging of the abdomen and pelvis was performed using the standard protocol following bolus administration of intravenous contrast. CONTRAST:  153m OMNIPAQUE IOHEXOL 300 MG/ML  SOLN COMPARISON:  11/11/2019 FINDINGS: Lower chest: Left greater than right pleural effusion and basilar consolidation, not significantly changed since prior study. Hepatobiliary:  No focal liver abnormality is seen. No gallstones, gallbladder wall thickening, or biliary dilatation. Pancreas: Unremarkable. No pancreatic ductal dilatation or surrounding inflammatory changes. Spleen: Normal in size without focal abnormality. Adrenals/Urinary Tract: Nonobstructing right renal calculi are again identified largest measuring 8 mm. There is mild distension of the proximal right ureter, likely due to extrinsic compression due to residual inflammatory change in the right hemiabdomen. No hydronephrosis. The left kidney is unremarkable. The bladder is grossly normal.  The adrenals are unremarkable. Stomach/Bowel: Diverting colostomy left mid abdomen again noted. There is no bowel obstruction or ileus. Vascular/Lymphatic: Stable atherosclerosis throughout the abdominal aorta and its branches. No pathologic adenopathy. Reproductive: Uterus and bilateral adnexa are unremarkable. Other: Postsurgical changes are seen from prior midline laparotomy, with continued surgical packing and external dressing at the laparotomy site. There is no evidence of pneumoperitoneum. Trace free fluid is seen within the pericolic gutters and lower pelvis. The fluid collections seen in the right lower abdomen on prior CT has been completely evacuated with interval placement of an indwelling pigtail drainage catheter. There is a persistent surgical drain entering the right mid abdomen, coiled within the lower pelvis. Musculoskeletal: There are no acute or destructive bony lesions. Reconstructed images demonstrate no additional findings. IMPRESSION: 1. Complete resolution of the right mid abdominal abscess after pigtail drainage catheter placement. No recurrence or residual fluid collection. 2. Trace free fluid within the paracolic gutters and lower pelvis. 3. Postsurgical changes from laparotomy, distal colectomy, and left mid abdominal colostomy, stable. 4. Small left pleural effusion and trace right pleural effusion, grossly  stable. 5. Nonobstructing right renal calculi. There is some mild distension of the proximal right ureter, likely due to extrinsic compression from residual inflammatory changes overlying the right psoas muscle. No hydronephrosis. 6.  Aortic Atherosclerosis (ICD10-I70.0). Electronically Signed   By: MRanda NgoM.D.   On: 11/21/2019 15:24  CT ABDOMEN PELVIS W CONTRAST  Result Date: 11/11/2019 CLINICAL DATA:  Right lower quadrant abdominal pain. Known hematoma. EXAM: CT ABDOMEN AND PELVIS WITH CONTRAST TECHNIQUE: Multidetector CT imaging of the abdomen and pelvis was performed using the standard protocol following bolus administration of intravenous contrast. CONTRAST:  36m OMNIPAQUE IOHEXOL 300 MG/ML  SOLN COMPARISON:  CT scan 11/04/2019 FINDINGS: Lower chest: Persistent left-sided pleural effusion with overlying atelectasis. There is also a small amount of pericardial fluid which is stable. The right lung base is clear. Hepatobiliary: No hepatic lesions or intrahepatic biliary dilatation. The gallbladder appears normal. No common bile duct dilatation. Pancreas: No mass, inflammation or ductal dilatation. Spleen: Normal size. No focal lesions. Adrenals/Urinary Tract: The adrenal glands and kidneys are unremarkable. The bladder appears normal. Stomach/Bowel: The stomach, duodenum, small bowel and colon are unremarkable. No acute inflammatory process, mass lesion or obstructive findings. Left lower quadrant colostomy without complicating features. Stable appearing Hartmann's pouch. No leaking oral contrast is identified. Vascular/Lymphatic: Stable age advanced vascular calcifications. Stable small scattered mesenteric and retroperitoneal lymph nodes, likely reactive. Reproductive: The uterus and ovaries are unremarkable. Other: Stable drainage catheter in the pelvis with a small amount of surrounding fluid and a few dots of air, not unexpected. The large right-sided abdominal/pelvic hematoma is slightly  smaller. It measures 13 x 11 x 7 cm and previously measured 14.5 x 12 x 8 cm. Some residual layering hematoma but it appears to be largely liquified now. It now contains a small amount of gas and there is also some rim enhancement. Findings worrisome for infected hematoma. Aspiration/drainage may be indicated. Musculoskeletal: No significant bony findings. IMPRESSION: 1. Stable drainage catheter in the pelvis with a small amount of surrounding fluid and a few dots of air, not unexpected. 2. Slight interval decrease in size of the large right-sided abdominal/pelvic hematoma. It does now contain a small amount of gas and there is also some rim enhancement. Findings worrisome for infected hematoma. Aspiration/drainage may be indicated. 3. Stable left-sided pleural effusion with overlying atelectasis. 4. Stable Hartmann's pouch and left lower quadrant colostomy without complicating features. 5. Stable age advanced vascular calcifications. . Aortic Atherosclerosis (ICD10-I70.0). Electronically Signed   By: PMarijo SanesM.D.   On: 11/11/2019 08:00   CT IMAGE GUIDED DRAINAGE BY PERCUTANEOUS CATHETER  Result Date: 11/11/2019 CLINICAL DATA:  History of Crohn's disease with multiple abdominal surgeries and colectomy. Development postoperative fluid collection in the right abdomen suspicious for infected fluid collection. EXAM: CT GUIDED CATHETER DRAINAGE OF RIGHT-SIDED PERITONEAL ABSCESS ANESTHESIA/SEDATION: 1.5 mg IV Versed 75 mcg IV Fentanyl Total Moderate Sedation Time:  23 minutes The patient's level of consciousness and physiologic status were continuously monitored during the procedure by Radiology nursing. PROCEDURE: The procedure, risks, benefits, and alternatives were explained to the patient. Questions regarding the procedure were encouraged and answered. The patient understands and consents to the procedure. A time out was performed prior to initiating the procedure. CT was performed through the abdomen and  pelvis in a supine position with the right side rolled up slightly. The right abdominal wall was prepped with chlorhexidine in a sterile fashion, and a sterile drape was applied covering the operative field. A sterile gown and sterile gloves were used for the procedure. Local anesthesia was provided with 1% Lidocaine. Under CT guidance, an 18 gauge trocar needle was advanced to the level of a right-sided peritoneal fluid collection. After confirming needle tip position, fluid was aspirated. A guidewire was advanced through the needle and the needle  removed. The percutaneous tract was dilated over the guidewire. A 12 French percutaneous drainage catheter was advanced over the wire and formed. Catheter position was confirmed by CT. The catheter was connected to a suction bulb and secured at the skin with a Prolene retention suture and StatLock device. COMPLICATIONS: None FINDINGS: Aspiration at the level of right mid to lower abdominal fluid collection yielded foul-smelling, dark bloody fluid. A sample was sent for culture analysis. After placement of the drainage catheter, there is good return of fluid to suction bulb drainage. IMPRESSION: CT-guided percutaneous catheter drainage of right mid to lower abdominal fluid collection yielding foul-smelling, dark bloody fluid. A sample was sent for culture analysis. A 10 French drain was placed and attached to suction bulb drainage. Electronically Signed   By: Aletta Edouard M.D.   On: 11/11/2019 17:09    Labs:  Basic Metabolic Panel: Recent Labs  Lab 12/04/19 1846 12/07/19 0546  NA 137 132*  K 3.2* 3.7  CL 102 97*  CO2 24 25  GLUCOSE 107* 80  BUN 11 8  CREATININE 0.48 0.47  CALCIUM 8.5* 8.9    CBC: Recent Labs  Lab 12/04/19 1846 12/07/19 0546  WBC 6.7 8.4  HGB 8.1* 9.5*  HCT 25.6* 31.5*  MCV 103.6* 104.0*  PLT 502* 649*    CBG: No results for input(s): GLUCAP in the last 168 hours.  Family history.  Mother with hypertension.  Denies any  diabetes mellitus colon cancer esophageal cancer or rectal cancer  Brief HPI:   Natasha Barrera is a 54 y.o. right-handed female with history of hypertension, diverticulitis status post sigmoid colectomy 2013, ventral hernia repair tobacco abuse.  Recent admission 10/02/2019 to 10/04/2019 for complaints of abdominal discomfort diarrhea weight loss found to be hypotensive with electrolyte derangements.  CT abdomen pelvis at that time revealed diffuse colitis involving the ascending transverse and descending colon.  GI was consulted recommendations inpatient colostomy and stool studies.  Patient and her husband refused to stay for complete work-up left AMA.  Since her discharge 10/04/2019 continued experiencing worsening fatigue poor appetite loose stools as well as some swelling of lower extremities.  Patient presented back 10/18/2019 with increasing abdominal pain.  Blood pressure 97/65 hemoglobin 10.4 platelet 593 sodium 123 potassium 3.5 total protein 4.4 total bilirubin 1.3.  CTA unremarkable.  GI services consulted underwent colonoscopy upper GI endoscopy that showed the terminal ileum appeared normal.  There was some scarring and mucosal bridging as well as scattered areas of ulceration throughout the entire colon.  There was evidence of prior sigmoid colectomy and a few diverticuli.  EGD unremarkable.  Findings favored Crohn's colitis.  On the afternoon of 10/23/2019 patient began to experience increasing upper abdominal pain ultimately underwent CT revealing large amount of pneumoperitoneum as well as apparent thrombus in the infrarenal aorta with less than 50% luminal involvement with underlying calcified aortoiliac disease thrombus appears acute was not present on CT abdomen pelvis 10/02/2019.  Under exploratory laparotomy lysis of adhesions segmental colectomy and end colostomy 10/24/2019 per Dr. Windle Guard.  Postoperative course complicated by hypotension with follow-up critical care maintained on IV hydration as well  as Levophed.  She did require intravenous heparin for thrombus and did develop subsequent large mesenteric hematoma 10/30/2019 heparin discontinued.  On 11/04/2019 she wound dehiscence with visible transverse colon underwent exploratory laparotomy colostomy revision fascial closure placement of 4 retention sutures and wound VAC along with right JP drain placement.  As of 11/11/2019 findings of right abdominal abscess status  post IR drain per Dr. Kathlene Cote as noted cultures Klebsiella and Pseudomonas and initially maintained on Zosyn switched to Cipro 11/18/2019.  Latest CT of the abdomen 11/21/2019 showed complete resolution of right mid abdominal abscess after pigtail drainage catheter placement.  No recurrence or residual fluid collection.  Patient undergoing twice daily wet-to-dry dressing changes she was cleared to begin subcutaneous Lovenox for DVT prophylaxis.  Patient was admitted for a comprehensive rehab program   Hospital Course: Kinsley D Deike was admitted to rehab 11/23/2019 for inpatient therapies to consist of PT, ST and OT at least three hours five days a week. Past admission physiatrist, therapy team and rehab RN have worked together to provide customized collaborative inpatient rehab.  Pertaining to patient's Crohn's disease diverticulitis status post exploratory laparotomy with Hartman's procedure 6/71/2458 complicated by fascial dehiscence mesenteric hematoma pelvic abscess and mucocutaneous separation of colostomy status post exploratory laparotomy colostomy revision drain placement wound VAC 0/12/9831 further complicated by development of intra-abdominal fluid collection status post right lower quadrant drain placement 11/11/2019 per interventional radiology Dr. Kathlene Cote.  She would continue on chronic prednisone for her Crohn's disease.  Subcutaneous Lovenox for DVT prophylaxis.  Her drain was removed 12/06/2019.  In regards to her intra-abdominal abscess she completed her last course of antibiotics of  Cipro 11/25/2019.  Pain managed with use of Neurontin as well as Lidoderm patch as directed Robaxin for muscle spasms as well as tramadol as needed.  Mood stabilization with Lexapro as well as melatonin to help aid in sleep.  Bouts of hypotension monitored with support hose she was placed on ProAmatine.  Patient with bouts of hypokalemia latest potassium level 3.7.   Blood pressures were monitored on TID basis and soft and controlled     Rehab course: During patient's stay in rehab weekly team conferences were held to monitor patient's progress, set goals and discuss barriers to discharge. At admission, patient required minimal assist stand pivot transfers max assist squat pivot transfers minimal assist 3 feet.  Moderate assist upper body bathing max is lower body bathing moderate assist upper body dressing moderate assist lower body dressing  Physical exam.  Blood pressure 103/63 pulse 105 temperature 98.7 respirations 18 oxygen saturation 98% room air Constitutional.  No acute distress HEENT Head.  Normocephalic and atraumatic Eyes.  Pupils round and reactive to light no discharge.nystagmus Neck.  Supple nontender no JVD without thyromegaly Cardiac regular rate rhythm without any extra sounds or murmur heard Abdomen soft nontender colostomy in place Respiratory effort normal no respiratory distress without wheeze Extremities.  No clubbing cyanosis or edema Neurologic alert oriented 4/5 strength bilateral upper extremities 3/5 strength bilateral lower extremities   /She  has had improvement in activity tolerance, balance, postural control as well as ability to compensate for deficits. Marlana Salvage has had improvement in functional use RUE/LUE  and RLE/LLE as well as improvement in awareness.  Transfers supine to sit with side rails with supervision.  Able to don her shoes with set up.  Perform sit to stand with minimal assist from elevated bed to simulate height of bed at home.  Propelled her  wheelchair independently ambulates 170 feet contact-guard assist.  Patient bathes and dresses at sit to stand level at edge of bed.  Full family teaching completed discharge to home       Disposition: Discharge to home    Diet: Soft  Special Instructions: No driving smoking or alcohol  Routine colostomy care  Damp to dry dressing changes to midline wound change  twice daily  Follow-up interventional radiology Dr. Kathlene Cote in regards to JP drain care phone [phone]  Medications at discharge 1.  Tylenol as needed 2.  B complex with vitamin C 1 tablet daily 3.  Lexapro 10 mg p.o. daily 4.  Ferrous sulfate 3 and 25 mg p.o. daily 5.  Neurontin 200 mg p.o. twice daily 6.  Lidoderm patch change as directed 7.  Melatonin 3 mg nightly as needed sleep 8.  Robaxin 1000 mg every 8 hours 9.  ProAmatine 10 mg p.o. twice daily with breakfast and lunch 10.  Multivitamin daily 11.  Protonix 40 mg p.o. daily 12.  Prednisone 20 mg p.o. daily 13.  Tramadol 50 to 100 mg every 6 hours as needed moderate pain 14.  Potassium chloride 20 mEq p.o. daily   30-35 minutes were spent completing discharge summary and discharge planning    Follow-up Information    Meredith Staggers, MD Follow up.   Specialty: Physical Medicine and Rehabilitation Why: No follow-up needed Contact information: [address] [place] [address] [phone]        Irene Shipper, MD Follow up.   Specialty: Gastroenterology Why: Call for appointment Contact information: [address] [place] [address] [phone]        Clovis Riley, MD. Go on 12/24/2019.   Specialty: General Surgery Why: Your appointment is 9/23 at 9:10am Please arrive 30 minutes prior to your appointment to check in and fill out paperwork. Bring photo ID and insurance information. Contact information: [address] [place] [address] [phone]        Aletta Edouard, MD Follow  up.   Specialties: Interventional Radiology, Radiology Why: Call for appointment Contact information: [address] [place] [address] [phone]               Signed: Lavon Paganini [place] 12/09/2019, 5:25 AM

## 2019-12-07 NOTE — Progress Notes (Signed)
Physical Therapy Session Note  Patient Details  Name: Natasha Barrera MRN: [id number] Date of Birth: 03/04/1966  Today's Date: 12/07/2019 PT Individual Time: 3790-2409 PT Individual Time Calculation (min): 90 min   Short Term Goals: Week 2:  PT Short Term Goal 1 (Week 2): Pt will complete car transfer with mod assist & LRAD. PT Short Term Goal 2 (Week 2): Pt will ambulate 75 ft with LRAD & min assist. PT Short Term Goal 3 (Week 2): Pt will complete furniture transfer with [place].  Skilled Therapeutic Interventions/Progress Updates:  Pt received in bed & agreeable to tx. Pt's husband Mikki Santee) present for caregiver training. Pt dons tennis shoes sitting EOB with set up assist. PT educates pt & Mikki Santee on need for hands on assist at all times and reviewed modifications to increase safety & ease of transfers (increasing seat height, wedging book under rocking recliner, sitting in chairs with armrests). PT assists pt with sit<>stand with pt only requiring min assist on this date & Mikki Santee assisting her with transfers from w/c, car, recliner during session. Pt is able to ambulate with RW & CGA from [place] with instructional cuing from PT to ambulate within base of AD vs leaning forward onto RW. Pt completes car & recliner transfer with min assist. Reviewed pt's inability to drive & shower until cleared by MD but discussed & practiced shower transfer with TTB for preparation for when pt wants to shave legs in shower. Reviewed anticipated DME - RW, transport w/c for community mobility, BSC, & TTB as well as 24 hr assist & safety recommendations. Discussed HHPT f/u and activities pt can/cannot do at home 2/2 safety reasons; pt appears to have good idea of activities that are/aren't safe for her to physically attempt. Pt propels w/c with BUE & set up assist. Pt utilized nu-step on level 1 x 5 minutes with 2 rest breaks with BLE only for LE strengthening. Pt then performs 5 minutes on level 3 with all four  extremities with task focusing on global strengthening & endurance training. Back in room, reviewed w/c parts management with Mikki Santee & he assists her to bed. Safety plan updated & nurse made aware of Mikki Santee checked off to assist pt with bed<>w/c transfers with RW. Educated pt & Mikki Santee on new d/c date of 9/8. Pt left in bed with alarm set, call bell & all needs in reach.   Therapy Documentation Precautions:  Precautions Precautions: Fall, Other (comment) Precaution Comments: Jp drain x2 Right side / ostomy L side Required Braces or Orthoses: Other Brace Other Brace: ted hose, abdominal binder is optional if pt wants it for comfort or not (cannot be wrapped tightly enough to assist with BP management) Restrictions Weight Bearing Restrictions: No  Pain: 0/10 at rest, 2/10 abdominal soreness with movement - rest breaks provided PRN   Therapy/Group: Individual Therapy  [name] 12/07/2019, 11:01 AM

## 2019-12-07 NOTE — Progress Notes (Signed)
Occupational Therapy Session Note  Patient Details  Name: Natasha Barrera MRN: [id number] Date of Birth: 09/28/1965  Today's Date: 12/07/2019 OT Individual Time: 0700-0757 OT Individual Time Calculation (min): 57 min    Short Term Goals: Week 1:  OT Short Term Goal 1 (Week 1): Pt will complete LB bathing sit to stand with mod assist for two consecutive sessions. OT Short Term Goal 1 - Progress (Week 1): Met OT Short Term Goal 2 (Week 1): Pt will complete LB dressing with mod assist sit to stand for two consecutive sessions. OT Short Term Goal 2 - Progress (Week 1): Met OT Short Term Goal 3 (Week 1): Pt will complete toilet transfers stand pivot with use of the RW and mod assist. OT Short Term Goal 3 - Progress (Week 1): Met OT Short Term Goal 4 (Week 1): Pt will increase endurance by being able to stand at the sink with mod assist for greater than 2 mins during 1 grooming task. OT Short Term Goal 4 - Progress (Week 1): Met  Skilled Therapeutic Interventions/Progress Updates:    1:1. Pt received in bed with no pain and agreeable to OT. Pt bathes and dresses at sit to stand level at EOB with SET UP execpt for MIN-MOD A to power up from bed level to stand/steady during LB advancing pants past hips. Pt completes ambulatory trasnfer to chair after sit to stand with CGA. Pt completes groomin at sink with MOD I and MOD I w/c mobility to all tx destination. tp completes stanidng balance/weight shifting with VC for keeping heels planted and larger BOS while playing Wii balance board activity with RW and CGA with 1 posterior LOB with MIN A to upright. Pt completes ambulatory search for items in gym using walker bag for energy conservation during item retrieval from knee to overhead height. Exited session with pt seated in bed, exit alarm in and call light in reach.  Therapy Documentation Precautions:  Precautions Precautions: Fall, Other (comment) Precaution Comments: Jp drain x2 Right side / ostomy L  side Required Braces or Orthoses: Other Brace Other Brace: ted hose, abdominal binder is optional if pt wants it for comfort or not (cannot be wrapped tightly enough to assist with BP management) Restrictions Weight Bearing Restrictions: No General:   Vital Signs: Therapy Vitals Temp: 98.2 F (36.8 C) Pulse Rate: 90 Resp: 17 BP: 95/66 Patient Position (if appropriate): Lying Pain:   ADL: ADL Eating: Independent Where Assessed-Eating: Wheelchair Grooming: Setup Where Assessed-Grooming: Wheelchair Upper Body Bathing: Supervision/safety Where Assessed-Upper Body Bathing: Wheelchair Lower Body Bathing: Dependent Where Assessed-Lower Body Bathing: Wheelchair, Sitting at sink, Standing at sink Upper Body Dressing: Supervision/safety Where Assessed-Upper Body Dressing: Wheelchair Lower Body Dressing: Dependent Where Assessed-Lower Body Dressing: Wheelchair Toileting: Dependent Where Assessed-Toileting: Bedside Commode Toilet Transfer: Dependent Toilet Transfer Method: Stand pivot Toilet Transfer Equipment: Bedside commode Tub/Shower Transfer: Not assessed Walk-In Shower Transfer: Not assessed Vision   Perception    Praxis   Exercises:   Other Treatments:     Therapy/Group: Individual Therapy   M  12/07/2019, 7:01 AM  

## 2019-12-08 ENCOUNTER — Inpatient Hospital Stay (HOSPITAL_COMMUNITY): Payer: 59 | Admitting: Occupational Therapy

## 2019-12-08 ENCOUNTER — Inpatient Hospital Stay (HOSPITAL_COMMUNITY): Payer: 59 | Admitting: Physical Therapy

## 2019-12-08 ENCOUNTER — Encounter: Payer: 59 | Admitting: Vascular Surgery

## 2019-12-08 LAB — VITAMIN B6

## 2019-12-08 MED ORDER — POTASSIUM CHLORIDE CRYS ER 20 MEQ PO TBCR: 20.0000 meq | EXTENDED_RELEASE_TABLET | Freq: Every day | ORAL | 0 refills | Status: AC

## 2019-12-08 MED ORDER — PANTOPRAZOLE SODIUM 40 MG PO TBEC: 40.0000 mg | DELAYED_RELEASE_TABLET | Freq: Every day | ORAL | 0 refills | Status: AC

## 2019-12-08 MED ORDER — MIDODRINE HCL 10 MG PO TABS: 10.0000 mg | ORAL_TABLET | Freq: Two times a day (BID) | ORAL | 0 refills | Status: AC

## 2019-12-08 MED ORDER — PREDNISONE 20 MG PO TABS: 20.0000 mg | ORAL_TABLET | Freq: Every day | ORAL | 0 refills | Status: AC

## 2019-12-08 MED ORDER — B COMPLEX-C PO TABS: 1.0000 | ORAL_TABLET | Freq: Every day | ORAL | 0 refills | Status: AC

## 2019-12-08 MED ORDER — GABAPENTIN 100 MG PO CAPS: 200.0000 mg | ORAL_CAPSULE | Freq: Two times a day (BID) | ORAL | 0 refills | Status: AC

## 2019-12-08 MED ORDER — ESCITALOPRAM OXALATE 10 MG PO TABS: 10.0000 mg | ORAL_TABLET | Freq: Every day | ORAL | 0 refills | Status: AC

## 2019-12-08 MED ORDER — METHOCARBAMOL 500 MG PO TABS: 1000.0000 mg | ORAL_TABLET | Freq: Three times a day (TID) | ORAL | 0 refills | Status: AC

## 2019-12-08 MED ORDER — FERROUS SULFATE 325 (65 FE) MG PO TABS: 325.0000 mg | ORAL_TABLET | Freq: Every day | ORAL | 3 refills | Status: AC

## 2019-12-08 NOTE — Progress Notes (Signed)
Physical Therapy Session Note  Patient Details  Name: Natasha Barrera MRN: [id number] Date of Birth: 05/29/1965  Today's Date: 12/08/2019 PT Individual Time: 1418-1445 PT Individual Time Calculation (min): 27 min   Short Term Goals: Week 2:  PT Short Term Goal 1 (Week 2): Pt will complete car transfer with mod assist & LRAD. PT Short Term Goal 2 (Week 2): Pt will ambulate 75 ft with LRAD & min assist. PT Short Term Goal 3 (Week 2): Pt will complete furniture transfer with mod assist & LRAD.  Skilled Therapeutic Interventions/Progress Updates: Pt presented in bed agreeable to therapy. Pt denies pain at start of session. Performed bed mobility mod I with use of bed features and donned shoes with set up assist. Performed STS from EOB with minA and ambulatory transfer to w/c. Pt transported to day room for time management and energy conservation. Pt participated in TUG test with avg score of 26.17sec from x 3 trials. Verbal cues provided during STS for pushing through heel and increasing anterior lean (pt with x 1 occurrence of sliding forward in w/c with insufficient quad strength to extend knees requiring assist from PTA to scoot back into w/c to prevent sliding to floor. Pt then participated in several bouts of corn hole in standing with RW support for dynamic balance. Pt propelled back to room with supervision and left in w/c with call bell within reach and current needs met.      Therapy Documentation Precautions:  Precautions Precautions: Fall Precaution Comments: JP drain x 1 R side, ostomy L side, abdominal wound Required Braces or Orthoses: Other Brace Other Brace: ted hose, abdominal binder is optional if pt wants it for comfort or not (cannot be wrapped tightly enough to assist with BP management) Restrictions Weight Bearing Restrictions: No General:   Vital Signs:  Pain: Pain Assessment Pain Scale: 0-10 Pain Score: 0-No pain Mobility: Bed Mobility Bed Mobility: Rolling  Right;Rolling Left;Supine to Sit;Sit to Supine;Left Sidelying to Sit Rolling Right: Independent with assistive device Rolling Left: Independent with assistive device Left Sidelying to Sit: Independent with assistive device Supine to Sit: Independent with assistive device Sit to Supine: Independent with assistive device Transfers Transfers: Sit to Stand;Stand to Sit Sit to Stand: Minimal Assistance - Patient > 75% Stand to Sit: Minimal Assistance - Patient > 75% Stand Pivot Transfer Details: Verbal cues for precautions/safety Transfer (Assistive device): Rolling walker Locomotion :    Trunk/Postural Assessment : Cervical Assessment Cervical Assessment: Exceptions to WFL Thoracic Assessment Thoracic Assessment: Exceptions to WFL Lumbar Assessment Lumbar Assessment: Within Functional Limits Postural Control Righting Reactions: delayed 2/2 generalized weakness Protective Responses: delayed 2/2 generalized weakness  Balance: Balance Balance Assessed: Yes Standardized Balance Assessment Standardized Balance Assessment: Timed Up and Go Test Timed Up and Go Test TUG: Normal TUG Normal TUG (seconds): 26.17 (avg 3 trials) Static Sitting Balance Static Sitting - Balance Support: Bilateral upper extremity supported;Feet supported Static Sitting - Level of Assistance: 6: Modified independent (Device/Increase time) Dynamic Sitting Balance Dynamic Sitting - Balance Support: Feet unsupported;During functional activity Dynamic Sitting - Level of Assistance: 5: Stand by assistance Static Standing Balance Static Standing - Balance Support: During functional activity;Bilateral upper extremity supported Static Standing - Level of Assistance: 5: Stand by assistance Dynamic Standing Balance Dynamic Standing - Balance Support: During functional activity Dynamic Standing - Level of Assistance: 5: Stand by assistance Exercises:   Other Treatments:      Therapy/Group: Individual  Therapy    12/08/2019, 2:58 PM  

## 2019-12-08 NOTE — Patient Care Conference (Signed)
Inpatient RehabilitationTeam Conference and Plan of Care Update Date: 12/08/2019   Time: 10:37 AM    Patient Name: Natasha Barrera      Medical Record Number: [id number]  Date of Birth: [birth year] Sex: Female         Room/Bed: 4W05C/4W05C-01 Payor Info: Payor: Holland Falling / Plan: [employer] NAP / Product Type: *No Product type* /    Admit Date/Time:  11/23/2019  5:12 PM  Primary Diagnosis:  [facility] Problems: Principal Problem:   Debility Active Problems:   Protein-calorie malnutrition, severe ([place])    Expected Discharge Date: Expected Discharge Date: 12/09/19  Team Members Present: Physician leading conference: Dr. Alger Simons Care Coodinator Present: Loralee Pacas, LCSWA;Jadelin Eng Creig Hines, RN, BSN, CRRN Nurse Present: Serena Croissant, LPN PT Present: Lavone Nian, PT OT Present: Laverle Hobby, OT PPS Coordinator present : Ileana Ladd, Burna Mortimer, SLP     Current Status/Progress Goal Weekly Team Focus  Bowel/Bladder   Pt continent of bladder. Colostomy for stool.  Pt to maintain bladder continence  Assess bladder and colostomy every shift and PRN   Swallow/Nutrition/ Hydration             ADL's   (S) UB ADLs, min A LB dressing sit <> stand, (S) for lateral leans, (S) toileting tasks, (S) SB transfers  Supervision overall  ADLs, transfers, functioanl activity tolerance, d/c planning, family education   Mobility   CGA gait with RW, mod<>min assist sit<>Stand transfers, mod I w/c mobility  min assist overall, household ambulation  transfers, bed mobility, gait, strengthening, endurance, balance, pt education, d/c planning   Communication             Safety/Cognition/ Behavioral Observations            Pain   Pt with intermittent abdominal pain. Rating pain 3-6/10 prior to Tylenol which pt prefers for pain management  Maintain pain level <3/10  Assess pain every shift and PRN   Skin   Open abdominal incision with retention sutures requiring twice a day  dressing changes.  Continue dressing changes to abdomen BID. No new skin breakdown.  Assess skin every shift and PRN     Discharge Planning:  Pt to discharge to home with her husband who is currently working from home. Pt states he is planning to stay at home to provide care to her if needed at d/c. Pt to determine if the DME she has access too is in good contidion: RW, w/c and 3in1 BSC.   Team Discussion: Continent Bladder, Ostomy for stool, incision looks good. At goal level with OT and PT. Patient on target to meet rehab goals:  yes  *See Care Plan and progress notes for long and short-term goals.   Revisions to Treatment Plan:  None  Teaching Needs: Education complete  Current Barriers to Discharge: Wound Care  Possible Resolutions to Barriers: Go over wound care with patient and caregiver before discharge.     Medical Summary Current Status: improving strength. wounds closing. jp's out. pain controlled. bp soft but stable  Barriers to Discharge: Medical stability   Possible Resolutions to Barriers/Weekly Focus: finalizing dc planning, meds. wound care ed   Continued Need for Acute Rehabilitation Level of Care: The patient requires daily medical management by a physician with specialized training in physical medicine and rehabilitation for the following reasons: Direction of a multidisciplinary physical rehabilitation program to maximize functional independence : Yes Medical management of patient stability for increased activity during participation in an intensive rehabilitation  regime.: Yes Analysis of laboratory values and/or radiology reports with any subsequent need for medication adjustment and/or medical intervention. : Yes   I attest that I was present, lead the team conference, and concur with the assessment and plan of the team.   Dorthula Nettles G 12/08/2019, 1:32 PM

## 2019-12-08 NOTE — Progress Notes (Signed)
Physical Therapy Discharge Summary  Patient Details  Name: Natasha Barrera MRN: [id number] Date of Birth: 07/20/1965  Today's Date: 12/08/2019 PT Individual Time: 0804-0846 and 1452-1547 PT Individual Time Calculation (min): 42 min and 55 min   Patient has met 9 of 9 long term goals due to improved activity tolerance, improved balance, improved postural control, increased strength and ability to compensate for deficits.  Patient to discharge at an ambulatory level CGA gait with RW, min assist transfers.   Patient's care partner is independent to provide the necessary physical assistance at discharge.  Reasons goals not met: n/a  Recommendation:  Patient will benefit from ongoing skilled PT services in home health setting to continue to advance safe functional mobility, address ongoing impairments in balance, strengthening, gait, stair negotiation, endurance, and minimize fall risk.  Equipment: RW  Reasons for discharge: treatment goals met and discharge from hospital  Patient/family agrees with progress made and goals achieved: Yes  Skilled PT Treatment: Treatment 1: Pt received in bed & agreeable to tx. Pt dons shoes sitting EOB with set up assist. Sit<>stand throughout session with RW & min assist & ambulatory transfers with RW & CGA. Pt propelled w/c with BUE & set up assist room>ortho gym. Pt completes car transfer at sedan simulated height with RW & min assist, using BUE to assist LE in/out of car. Pt negotiated ramp & mulch with RW & CGA. Pt completes bed mobility in apartment with mod I. Pt negotiates 3" step 1 step x 11 times repeatedly with B rails & min assist with PT providing cuing for compensatory pattern. Pt denies questions/concerns re: d/c home. At end of session pt left in bed with alarm set, call bell & all needs in reach. Pain: pt with c/o unrated abdominal soreness & rest breaks provided PRN  Treatment 2: Pt received in w/c & agreeable to tx. W/c propulsion room>outside  north tower with mod I with BUE for strengthening & cardiopulmonary endurance training. Sit<>stand with min assist with RW & gait x 100 ft x 2 outside over uneven surface with RW & CGA. Back on unit, stand pivot w/c<>nu-step with RW & min assist sit>stand, CGA for pivot. Pt utilized nu-step on level 1 x 5 minutes with BLE only then level 4 x 5 minutes with all four extremities with task focusing on BLE then global strengthening & endurance training. Back in room pt completes short ambulatory transfer to bed with min assist sit>stand, CGA otherwise with RW. Pt returns to bed with mod I. Pt left in bed with alarm set, call bell & all needs in reach.  Educated pt on interpretation of Tug score & high fall risk as noted from other PT session with pt verbalizing understanding. Pain: denies pain  PT Discharge Precautions/Restrictions Precautions Precautions: Fall Precaution Comments: JP drain x 1 R side, ostomy L side, abdominal wound Restrictions Weight Bearing Restrictions: No  Vision/Perception Pt wears glasses for reading only at baseline. No changes in baseline vision. Perception WNL. Praxis Intact.   Cognition Overall Cognitive Status: Within Functional Limits for tasks assessed Arousal/Alertness: Awake/alert Orientation Level: Oriented X4 Memory: Appears intact Awareness: Appears intact Problem Solving: Appears intact Safety/Judgment: Appears intact  Sensation Sensation Light Touch: Appears Intact Proprioception: Appears Intact Coordination Gross Motor Movements are Fluid and Coordinated: Yes Fine Motor Movements are Fluid and Coordinated: Yes Heel Shin Test: BLE equally limited by weakness  Motor  Motor Motor: Abnormal postural alignment and control Motor - Skilled Clinical Observations: generalized deconditioning Motor -   Discharge Observations: generalized deconditioning   Mobility Bed Mobility Bed Mobility: Rolling Right;Rolling Left;Supine to Sit;Sit to Supine;Left  Sidelying to Sit Rolling Right: Independent with assistive device Rolling Left: Independent with assistive device Left Sidelying to Sit: Independent with assistive device Supine to Sit: Independent with assistive device Sit to Supine: Independent with assistive device Transfers Transfers: Sit to Stand;Stand to Sit Sit to Stand: Minimal Assistance - Patient > 75% Stand to Sit: Minimal Assistance - Patient > 75% Pivot portion of transfer CGA with RW Stand Pivot Transfer Details: Verbal cues for precautions/safety Transfer (Assistive device): Rolling walker  Locomotion  Gait Ambulation: Yes Gait Assistance: Contact Guard/Touching assist Gait Distance (Feet): 200 Feet Assistive device: Rolling walker Gait Gait: Yes Gait Pattern: Impaired Gait Pattern: Decreased stride length;Decreased step length - left;Decreased step length - right;Decreased hip/knee flexion - left;Decreased hip/knee flexion - right;Decreased dorsiflexion - right;Decreased dorsiflexion - left (decreased heel strike BLE) Gait velocity: decreased Stairs / Additional Locomotion Stairs: Yes Stairs Assistance: Minimal Assistance - Patient > 75% Stair Management Technique: Two rails Number of Stairs: 11 (1 step x 11 times) Height of Stairs: 3 (inches) Ramp: Contact Guard/touching assist (ambulatory with RW) Wheelchair Mobility Wheelchair Mobility: Yes Wheelchair Assistance: mod I Environmental health practitioner: Both upper extremities Wheelchair Parts Management: Needs assistance Distance: 150 ft   Trunk/Postural Assessment  Cervical Assessment Cervical Assessment: Exceptions to [facility] (forward head) Thoracic Assessment Thoracic Assessment: Exceptions to [facility] (rounded shoulders) Postural Control Postural Control: Deficits on evaluation Righting Reactions: delayed 2/2 generalized weakness Protective Responses: delayed 2/2 generalized weakness   Balance Balance Balance Assessed: Yes Standardized Balance  Assessment  Standardized Balance Assessment: Timed Up and Go Test Timed Up and Go Test TUG: Normal TUG Normal TUG (seconds): 26.17 (avg 3 trials) - with RW   Static Sitting Balance Static Sitting - Balance Support: Bilateral upper extremity supported;Feet supported Static Sitting - Level of Assistance: 6: Modified independent (Device/Increase time) Dynamic Sitting Balance Dynamic Sitting - Balance Support: Feet unsupported;During functional activity Dynamic Sitting - Level of Assistance: 5: Stand by assistance Static Standing Balance Static Standing - Balance Support: During functional activity;Bilateral upper extremity supported Static Standing - Level of Assistance: 5: CGA with RW Dynamic Standing Balance Dynamic Standing - Balance Support: During functional activity Dynamic Standing - Level of Assistance: 5: CGA with RW during gait  Extremity Assessment  RUE Assessment RUE Assessment: Within Functional Limits LUE Assessment LUE Assessment: Within Functional Limits RLE Assessment Active Range of Motion (AROM) Comments: WFL General Strength Comments: in sitting: 3/5 for the following hip flexion, knee extension, ankle dorsiflexion LLE Assessment Active Range of Motion (AROM) Comments: WFL General Strength Comments: in sitting: 3/5 for the following hip flexion, knee extension, ankle dorsiflexion    [name] 12/08/2019, 3:50 PM

## 2019-12-08 NOTE — Progress Notes (Addendum)
Patient ID: Yanet D Jakubek, female   DOB: 04/29/1965, 54 y.o.   MRN: [id number]  SW waiting on updates from Britney[facility] who is waiting to hear from branch on if they can accept pt. SW waiting on follow-up.   SW met with pt in room to inform on above. SW confirms RW delivered to pt room.   [facility]- branch unable to accommodate at this time as staff are on PTO.   SW waiting up dsent out HHPT/OT/SN referral to: Tiffany/[facility]- waiting on follow-up from branch Drew/[facility]-declined Cory/[facility]-declined Carolyn/[facility]- waiting on follow-up Cheryl/[facility]- not in network Cassie/[facility]- no contract    , MSW, LCSWA Office: [phone] Cell: [phone] Fax: [phone] 

## 2019-12-08 NOTE — Progress Notes (Signed)
Sent for pt to come to radiology for drain inj. Pt cannot come down at this time because she is participating in PT. Will attempt to call for pt if schedule allows later this afternoon.

## 2019-12-08 NOTE — Progress Notes (Signed)
Nutrition Follow-up  DOCUMENTATION CODES:   Severe malnutrition in context of chronic illness  INTERVENTION:   - ContinueEnsure Enlive poQID, each supplement provides 350 kcal and 20 grams of protein  - Continue ProSource 30 ml po once daily, each supplement provides 100 kcal and 15 grams of protein  - Continue MVI with minerals daily  NUTRITION DIAGNOSIS:   Severe Malnutrition related to chronic illness (Crohn's disease) as evidenced by moderate fat depletion, severe muscle depletion, percent weight loss (17.4% weight loss in 2 months).  Ongoing  GOAL:   Patient will meet greater than or equal to 90% of their needs  Progressing  MONITOR:   PO intake, Supplement acceptance, Labs, Weight trends, Skin, I & O's  REASON FOR ASSESSMENT:   Malnutrition Screening Tool    ASSESSMENT:   54 year old female with PMH of HTN, diverticulitis s/p sigmoid colectomy 2013, ventral hernia repair, tobacco use. Pt with recent admission 10/02/19 to 10/04/19 for complaints of abdominal discomfort, diarrhea, and weight loss. CT A/P at that time revealed diffuse colitis. Pt presented back 10/18/19 with increasing abdominal pain. Pt underwent colonoscopy/upper GI endoscopy 10/21/19 that showed the terminal ileum appeared normal. There was scarring and mucosal bridging as well as scattered areas of ulceration throughout the entire colon. Findings favored Crohn's colitis. 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 On 11/04/19 pt underwent ex-lap with colostomy revision, fascial closure, placement of 4 retention sutures and wound VAC along with right JP drain placement. Admitted  to CIR on 8/23.  9/05 - JP drain removed  Noted target d/c date of 9/08.  Spoke with pt at bedside. Pt reports that she is eating very well ("like a pig"). Discussed the importance of continued adequate PO intake with a focus on protein-rich foods. Pt expresses understanding. She reports she is drinking the Ensure Enlive supplements and has some at home for after d/c.  Pt with some diet-related questions. All questions answered. Encouraged pt to try small portions of new foods and monitor for side effects. Pt expresses understanding.  Admit weight: 48 kg Current weight: 43.7 kg  Meal Completion: 60-100%  Medications reviewed and include: ProSource Plus daily, B-complex with vitamin C, Ensure Enlive QID, ferrous sulfate, MVI with minerals, protonix, klor-con 20 mEq BID, vitamin A 10,000 units daily, prenisone  Labs reviewed: sodium 132, hemoglobin 9.5  Colostomy: 450 ml + 10 unmeasured occurrences x 24 hours  Diet Order:   Diet Order            DIET SOFT Room service appropriate? Yes; Fluid consistency: Thin  Diet effective now                 EDUCATION NEEDS:   Education needs have been addressed  Skin:  Skin Assessment: Skin Integrity Issues: Skin Integrity Issues: Incisions: abdomen  Last BM:  12/08/19 colostomy  Height:   Ht Readings from Last 1 Encounters:  11/24/19 5' 1"  (1.549 m)    Weight:   Wt Readings from Last 1 Encounters:  12/08/19 43.7 kg    BMI:  Body mass index is 18.2 kg/m.  Estimated Nutritional Needs:   Kcal:  1800-2000  Protein:  90-105 grams  Fluid:  >/= 1.8 L  Gaynell Face, MS, RD, LDN Inpatient Clinical Dietitian Please see [facility] for contact information.

## 2019-12-08 NOTE — Progress Notes (Signed)
[facility] PHYSICAL MEDICINE & REHABILITATION PROGRESS NOTE   Subjective/Complaints:  Up in w/c. Family ed went well yesterday. No new complaints. JP removed this morning   ROS: Patient denies fever, rash, sore throat, blurred vision, nausea, vomiting, diarrhea, cough, shortness of breath or chest pain,   headache, or mood change.    Objective:   No results found. Recent Labs    12/07/19 0546  WBC 8.4  HGB 9.5*  HCT 31.5*  PLT 649*   Recent Labs    12/07/19 0546  NA 132*  K 3.7  CL 97*  CO2 25  GLUCOSE 80  BUN 8  CREATININE 0.47  CALCIUM 8.9    Intake/Output Summary (Last 24 hours) at 12/08/2019 0859 Last data filed at 12/08/2019 0828 Gross per 24 hour  Intake 920 ml  Output 455 ml  Net 465 ml     Physical Exam: Vital Signs Blood pressure (!) 86/56, pulse 84, temperature 98.5 F (36.9 C), resp. rate 18, height 5' 1"  (1.549 m), weight 43.7 kg, last menstrual period 12/15/2014, SpO2 95 %. Constitutional: No distress . Vital signs reviewed. HEENT: EOMI, oral membranes moist Neck: supple Cardiovascular: RRR without murmur. No JVD    Respiratory/Chest: CTA Bilaterally without wheezes or rales. Normal effort    GI/Abdomen: BS +, non-tender, non-distended Ext: no clubbing, cyanosis, or edema Psych: pleasant and cooperative Skin: abdominal wound with pink granulation and retention sutures--continues to close.  JP drains out. Scant tan drainage from one site  Neuro:  Ox3 . Cranial nerves 2-12 are intact. Sensory exam is normal. Reflexes are 2+ in all 4's. Fine motor coordination is intact. No tremors. Motor function is grossly 4-/5 UE prox to 4/5 disally. LE:   3-/5 HF, 3+/5 KE, 4/5 ADF/PF.  Musculoskeletal: normal ROM  Assessment/Plan: 1. Functional deficits secondary to debility which require 3+ hours per day of interdisciplinary therapy in a comprehensive inpatient rehab setting.  Physiatrist is providing close team supervision and 24 hour management of active  medical problems listed below.  Physiatrist and rehab team continue to assess barriers to discharge/monitor patient progress toward functional and medical goals  Care Tool:  Bathing    Body parts bathed by patient: Right arm, Left arm, Chest, Abdomen, Face   Body parts bathed by helper: Front perineal area, Buttocks Body parts n/a: Front perineal area, Buttocks (did not attempt, secondary to completing earlier with nursing)   Bathing assist Assist Level: Supervision/Verbal cueing     Upper Body Dressing/Undressing Upper body dressing   What is the patient wearing?: Pull over shirt    Upper body assist Assist Level: Set up assist    Lower Body Dressing/Undressing Lower body dressing      What is the patient wearing?: Pants     Lower body assist Assist for lower body dressing: Maximal Assistance - Patient 25 - 49%     Toileting Toileting    Toileting assist Assist for toileting: Moderate Assistance - Patient 50 - 74%     Transfers Chair/bed transfer  Transfers assist     Chair/bed transfer assist level: Minimal Assistance - Patient > 75% Chair/bed transfer assistive device: Armrests, Programmer, multimedia   Ambulation assist   Ambulation activity did not occur: Safety/medical concerns  Assist level: Contact Guard/Touching assist Assistive device: Walker-rolling Max distance: 180   Walk 10 feet activity   Assist  Walk 10 feet activity did not occur: Safety/medical concerns  Assist level: Contact Guard/Touching assist Assistive device: Walker-rolling  Walk 50 feet activity   Assist Walk 50 feet with 2 turns activity did not occur: Safety/medical concerns  Assist level: Contact Guard/Touching assist Assistive device: Walker-rolling    Walk 150 feet activity   Assist Walk 150 feet activity did not occur: Safety/medical concerns  Assist level: Contact Guard/Touching assist Assistive device: Walker-rolling    Walk 10 feet on  uneven surface  activity   Assist Walk 10 feet on uneven surfaces activity did not occur: Safety/medical concerns   Assist level: Minimal Assistance - Patient > 75% Assistive device: Aeronautical engineer Will patient use wheelchair at discharge?: Yes Type of Wheelchair: Manual    Wheelchair assist level: Set up assist Max wheelchair distance: 150 ft    Wheelchair 50 feet with 2 turns activity    Assist        Assist Level: Set up assist   Wheelchair 150 feet activity     Assist      Assist Level: Set up assist   Blood pressure (!) 86/56, pulse 84, temperature 98.5 F (36.9 C), resp. rate 18, height 5' 1"  (1.549 m), weight 43.7 kg, last menstrual period 12/15/2014, SpO2 95 %.  Medical Problem List and Plan: 1.  Debility secondary to Crohn's disease/diverticulitis status post exploratory laparotomy with Hartman's procedure 6/83/4196 complicated by fascial dehiscence mesenteric hematoma pelvic abscess and mucocutaneous separation of colostomy status post exploratory laparotomy colostomy revision drain placement and wound VAC 05/04/2977 further complicated by development of intra-abdominal fluid collection status post right lower quadrant drain placement 11/11/2019 per interventional radiology Dr. Kathlene Cote.                -patient may not yet shower d/t abdominal wounds, drains             -ELOS/Goals: 9/8--family ed went well, team conf today  -Continue CIR 2.  Antithrombotics: -DVT/anticoagulation: Lovenox--can d/c at discharge, discussed with pt             -antiplatelet therapy: N/A 3. Pain Management: Neurontin 200 mg twice daily, Lidoderm patch as directed, Robaxin 1000 mg every 8 hours, tramadol as needed. Well controlled- changed tylenol to as needed. Abdominal pain and nausea-  PRN zofran. Improved.  9/7 pain well controlled- con't regimen  4. Mood: Lexapro 10 mg daily, melatonin 3 mg nightly as needed. Mood is good. Sleeping well  9/6-  sleeping well- con't regimen.              -antipsychotic agents: N/A. Lavender aromatherapy  - lexapro. 5. Neuropsych: This patient is capable of making decisions on her own behalf. 6. Skin/Wound Care: continue local care to abdominal wounds  -see JP drains below 7. Fluids/Electrolytes/Nutrition: encourage po with high protein given protein malnutrition  Eating very well  -continue protein supp for low albumin  8.  ID/intra-abdominal abscess.   Cipro 500 mg twice daily ended 8/25  9/6 labs all stable.   -JP's removed 9.  Acute blood loss anemia.  Hgb stable on 8/27. Labs pending 10.  Hypotension.     -  Continue TED stockings  -continue midodrine  -we will discuss weaning these after discharge 11. Hypokalemia: 4.3  8/24    9/6 potassium low normal 3.7  -continue supplement 12. Crohn's disease: continue prednisone 64m daily      LOS: 15 days A FACE TO FACE EVALUATION WAS PERFORMED  ZMeredith Staggers9/10/2019, 8:59 AM

## 2019-12-08 NOTE — Plan of Care (Signed)
  Problem: RH BOWEL ELIMINATION Goal: RH STG MANAGE BOWEL WITH ASSISTANCE Description: STG Manage Bowel with Assistance. Outcome: Progressing   Problem: RH BLADDER ELIMINATION Goal: RH STG MANAGE BLADDER WITH ASSISTANCE Description: STG Manage Bladder With Assistance Outcome: Progressing Goal: RH STG MANAGE BLADDER WITH EQUIPMENT WITH ASSISTANCE Description: STG Manage Bladder With Equipment With Assistance Outcome: Progressing   Problem: RH SKIN INTEGRITY Goal: RH STG SKIN FREE OF INFECTION/BREAKDOWN Outcome: Progressing Goal: RH STG MAINTAIN SKIN INTEGRITY WITH ASSISTANCE Description: STG Maintain Skin Integrity With Assistance. Outcome: Progressing Goal: RH STG ABLE TO PERFORM INCISION/WOUND CARE W/ASSISTANCE Description: STG Able To Perform Incision/Wound Care With Assistance. Outcome: Progressing   Problem: RH SAFETY Goal: RH STG ADHERE TO SAFETY PRECAUTIONS W/ASSISTANCE/DEVICE Description: STG Adhere to Safety Precautions With Assistance/Device. Outcome: Progressing   Problem: RH PAIN MANAGEMENT Goal: RH STG PAIN MANAGED AT OR BELOW PT'S PAIN GOAL Outcome: Progressing   Problem: RH KNOWLEDGE DEFICIT GENERAL Goal: RH STG INCREASE KNOWLEDGE OF SELF CARE AFTER HOSPITALIZATION Outcome: Progressing   Problem: Consults Goal: RH GENERAL PATIENT EDUCATION Description: See Patient Education module for education specifics. Outcome: Progressing

## 2019-12-08 NOTE — Progress Notes (Signed)
Removed JP site with small amount of tan colored drainage.

## 2019-12-08 NOTE — Progress Notes (Signed)
Occupational Therapy Discharge Summary  Patient Details  Name: Natasha Barrera MRN: [id number] Date of Birth: 1965/07/26  Today's Date: 12/08/2019 OT Individual Time: 5329-9242 OT Individual Time Calculation (min): 70 min    Patient has met 7 of 9 long term goals due to improved activity tolerance, improved balance, postural control and improved awareness.  Patient to discharge at [facility] Assist level.  Patient's care partner is independent to provide the necessary physical assistance at discharge.  Supervision for ADL transfers to toilet and TTB, Min A to boost into standing especially when fatigued. Pt has shown significant improvement and ready to DC home with husband to assist.   Reasons goals not met: Pt continues to require Min for LB dressing for assist to come up into standing but able to don over hips, Sit to stands Min A.   Recommendation:  Patient will benefit from ongoing skilled OT services in home health setting to continue to advance functional skills in the area of BADL and Reduce care partner burden.  Equipment: TTB  Reasons for discharge: treatment goals met and discharge from hospital  Patient/family agrees with progress made and goals achieved: Yes   Skilled Intervention: Pt greeted at time of session reclined in bed with HOB elevated and pt resting comfortably, agreeable to OT session. Pt stating she was [place] home tomorrow, OT initially went to speak with SW to confirm and pt is going home tomorrow with husband to assist and has completed family ed and training.   Supine to sit EOB with Mod I with bed features, Sit to stand from bed with Min A and ambulated to bathroom with CS with RW and performed toilet transfer in the same manner with BSC over toilet, sit to stand from elevated surface CS, 3/3 toileting tasks Supervision. Switched walker bag over to new RW and adjusted to appropriate height. Ambulated back to wheelchair in same manner and set up at sink level,  performed UB bathing and dressing Mod I. Simulated LB bathing and dressing d/t pt had already washing up that area with nursing, Min for dressing with assist to boost up to stand. Oral hygiene and grooming mod I sitting at wheelchair level. Wheelchair mobility around room with pt self propelling Mod I for item retrieval at various drawers with minimal bending and packing into bags for DC home. Reviewed techniques for mirror to assist with changing colostomy care. Brought to gym via wheelchair and performed ADL transfer to TTB with close supervision, Min A to boost up to standing but able to manage BLEs over tub edge. Discussed methods for washing hair at home until cleared to shower. Brought back to room and SPT to bed Min to stand CS for transfer with RW, Mod I sit to supine and alarm on, call bell in reach. No c/o dizziness throughout.    OT Discharge Precautions/Restrictions  Precautions Precautions: Fall Precaution Comments: JP drain x 1 R side, ostomy L side, abdominal wound Restrictions Weight Bearing Restrictions: No Pain Pain Assessment Pain Scale: 0-10 Pain Score: 0-No pain ADL ADL Eating: Independent Where Assessed-Eating: Wheelchair Grooming: Modified independent Where Assessed-Grooming: Wheelchair Upper Body Bathing: Modified independent Where Assessed-Upper Body Bathing: Wheelchair Lower Body Bathing: Supervision/safety Where Assessed-Lower Body Bathing: Wheelchair, Sitting at sink, Standing at sink Upper Body Dressing: Modified independent (Device) Where Assessed-Upper Body Dressing: Wheelchair Lower Body Dressing: Minimal assistance Where Assessed-Lower Body Dressing: Standing at sink, Sitting at sink Toileting: Supervision/safety Where Assessed-Toileting: Toilet, Bedside Commode Toilet Transfer: Close supervision Toilet Transfer Method:  Ambulating Science writer: Radiographer, therapeutic: Close supervison Social research officer, government: Not  assessed Vision Baseline Vision/History: Wears glasses Wears Glasses: Reading only Patient Visual Report: No change from baseline Perception  Perception: Within Functional Limits Praxis Praxis: Intact Cognition Overall Cognitive Status: Within Functional Limits for tasks assessed Arousal/Alertness: Awake/alert Orientation Level: Oriented X4 Memory: Appears intact Awareness: Appears intact Problem Solving: Appears intact Safety/Judgment: Appears intact Sensation Sensation Light Touch: Appears Intact Proprioception: Appears Intact Coordination Gross Motor Movements are Fluid and Coordinated: Yes Fine Motor Movements are Fluid and Coordinated: Yes Heel Shin Test: BLE equally limited by weakness Motor  Motor Motor: Abnormal postural alignment and control Motor - Skilled Clinical Observations: generalized deconditioning Motor - Discharge Observations: generalized deconditioning Mobility  Bed Mobility Bed Mobility: Rolling Right;Rolling Left;Supine to Sit;Sit to Supine;Left Sidelying to Sit Rolling Right: Independent with assistive device Rolling Left: Independent with assistive device Left Sidelying to Sit: Independent with assistive device Supine to Sit: Independent with assistive device Sit to Supine: Independent with assistive device Transfers Sit to Stand: Minimal Assistance - Patient > 75% Stand to Sit: Minimal Assistance - Patient > 75%  Trunk/Postural Assessment  Cervical Assessment Cervical Assessment: Exceptions to [facility] Thoracic Assessment Thoracic Assessment: Exceptions to [facility] Lumbar Assessment Lumbar Assessment: Within Functional Limits Postural Control Postural Control: Deficits on evaluation Righting Reactions: delayed 2/2 generalized weakness Protective Responses: delayed 2/2 generalized weakness  Balance Balance Balance Assessed: Yes Static Sitting Balance Static Sitting - Balance Support: Bilateral upper extremity supported;Feet supported Static  Sitting - Level of Assistance: 6: Modified independent (Device/Increase time) Dynamic Sitting Balance Dynamic Sitting - Balance Support: Feet unsupported;During functional activity Dynamic Sitting - Level of Assistance: 5: Stand by assistance Static Standing Balance Static Standing - Balance Support: During functional activity;Bilateral upper extremity supported Static Standing - Level of Assistance: 5: Stand by assistance Dynamic Standing Balance Dynamic Standing - Balance Support: During functional activity Dynamic Standing - Level of Assistance: 5: Stand by assistance Extremity/Trunk Assessment RUE Assessment RUE Assessment: Within Functional Limits LUE Assessment LUE Assessment: Within Functional Limits   Viona Gilmore 12/08/2019, 12:05 PM

## 2019-12-09 ENCOUNTER — Encounter: Payer: Self-pay | Admitting: *Deleted

## 2019-12-09 ENCOUNTER — Other Ambulatory Visit: Payer: 59

## 2019-12-09 LAB — VITAMIN B1: Vitamin B1 (Thiamine): 159.4 nmol/L (ref 66.5–200.0)

## 2019-12-09 NOTE — Progress Notes (Signed)
Recreational Therapy Discharge Summary Patient Details  Name: Natasha Barrera MRN: [id number] Date of Birth: Aug 10, 1965 Today's Date: 12/09/2019  Long term goals set: 1  Long term goals met: 1  Comments on progress toward goals: Pt has made great progress during LOS and is ready for discharge home today with family.  Pt met min assist level for simple-mod complex TR tasks standing. Pt does fatigue quickly needing seated rest breaks, is independent in recognizing need for rest.  TR sessions have focused on activity analysis identifying potential modifications, energy conservation, activity tolerance, dynamic balance & discharge planning.   Reasons goals not met: n/a  Equipment acquired: n/a  Reasons for discharge: discharge from hospital  Patient/family agrees with progress made and goals achieved: Yes  Ebbie Cherry 12/09/2019, 9:20 AM

## 2019-12-09 NOTE — Progress Notes (Signed)
[facility] PHYSICAL MEDICINE & REHABILITATION PROGRESS NOTE   Subjective/Complaints: No complaints this morning She states that she is very happy that she came here and feels that she is progressing well.  She is cleaning her colostomy bag  ROS: Patient denies fever, rash, sore throat, blurred vision, nausea, vomiting, diarrhea, cough, shortness of breath or chest pain,   headache, or mood change.    Objective:   No results found. Recent Labs    12/07/19 0546  WBC 8.4  HGB 9.5*  HCT 31.5*  PLT 649*   Recent Labs    12/07/19 0546  NA 132*  K 3.7  CL 97*  CO2 25  GLUCOSE 80  BUN 8  CREATININE 0.47  CALCIUM 8.9    Intake/Output Summary (Last 24 hours) at 12/09/2019 0913 Last data filed at 12/09/2019 0700 Gross per 24 hour  Intake 450 ml  Output 300 ml  Net 150 ml     Physical Exam: Vital Signs Blood pressure 101/64, pulse 83, temperature 98.4 F (36.9 C), temperature source Oral, resp. rate 16, height 5' 1"  (1.549 m), weight 47.5 kg, last menstrual period 12/15/2014, SpO2 100 %. General: Alert and oriented x 3, No apparent distress HEENT: Head is normocephalic, atraumatic, PERRLA, EOMI, sclera anicteric, oral mucosa pink and moist, dentition intact, ext ear canals clear,  Neck: Supple without JVD or lymphadenopathy Heart: Reg rate and rhythm. No murmurs rubs or gallops Chest: CTA bilaterally without wheezes, rales, or rhonchi; no distress Abdomen: Soft, non-tender, non-distended, bowel sounds positive. Extremities: No clubbing, cyanosis, or edema. Pulses are 2+ Psych: pleasant and cooperative Skin: abdominal wound with pink granulation and retention sutures--continues to close.  JP drains out. Scant tan drainage from one site  Neuro:  Ox3 . Cranial nerves 2-12 are intact. Sensory exam is normal. Reflexes are 2+ in all 4's. Fine motor coordination is intact. No tremors. Motor function is grossly 4-/5 UE prox to 4/5 disally. LE:   3-/5 HF, 3+/5 KE, 4/5 ADF/PF.   Musculoskeletal: normal ROM    Assessment/Plan: 1. Functional deficits secondary to debility which require 3+ hours per day of interdisciplinary therapy in a comprehensive inpatient rehab setting.  Physiatrist is providing close team supervision and 24 hour management of active medical problems listed below.  Physiatrist and rehab team continue to assess barriers to discharge/monitor patient progress toward functional and medical goals  Care Tool:  Bathing    Body parts bathed by patient: Right arm, Left arm, Chest, Abdomen, Face, Front perineal area, Buttocks, Right upper leg, Left upper leg, Right lower leg, Left lower leg   Body parts bathed by helper: Front perineal area, Buttocks Body parts n/a: Front perineal area, Buttocks (did not attempt, secondary to completing earlier with nursing)   Bathing assist Assist Level: Supervision/Verbal cueing     Upper Body Dressing/Undressing Upper body dressing   What is the patient wearing?: Pull over shirt    Upper body assist Assist Level: Independent with assistive device    Lower Body Dressing/Undressing Lower body dressing      What is the patient wearing?: Pants     Lower body assist Assist for lower body dressing: Minimal Assistance - Patient > 75%     Toileting Toileting    Toileting assist Assist for toileting: Supervision/Verbal cueing     Transfers Chair/bed transfer  Transfers assist     Chair/bed transfer assist level: Contact Guard/Touching assist Chair/bed transfer assistive device: Armrests, Programmer, multimedia   Ambulation assist  Ambulation activity did not occur: Safety/medical concerns  Assist level: Contact Guard/Touching assist Assistive device: Walker-rolling Max distance: 180   Walk 10 feet activity   Assist  Walk 10 feet activity did not occur: Safety/medical concerns  Assist level: Contact Guard/Touching assist Assistive device: Walker-rolling   Walk 50 feet  activity   Assist Walk 50 feet with 2 turns activity did not occur: Safety/medical concerns  Assist level: Contact Guard/Touching assist Assistive device: Walker-rolling    Walk 150 feet activity   Assist Walk 150 feet activity did not occur: Safety/medical concerns  Assist level: Contact Guard/Touching assist Assistive device: Walker-rolling    Walk 10 feet on uneven surface  activity   Assist Walk 10 feet on uneven surfaces activity did not occur: Safety/medical concerns   Assist level: Contact Guard/Touching assist Assistive device: Aeronautical engineer Will patient use wheelchair at discharge?: No (transport w/c for community mobility PRN) Type of Wheelchair: Manual    Wheelchair assist level: Independent Max wheelchair distance: 150 ft    Wheelchair 50 feet with 2 turns activity    Assist        Assist Level: Independent   Wheelchair 150 feet activity     Assist      Assist Level: Independent   Blood pressure 101/64, pulse 83, temperature 98.4 F (36.9 C), temperature source Oral, resp. rate 16, height 5' 1"  (1.549 m), weight 47.5 kg, last menstrual period 12/15/2014, SpO2 100 %.  Medical Problem List and Plan: 1.  Debility secondary to Crohn's disease/diverticulitis status post exploratory laparotomy with Hartman's procedure 3/73/4287 complicated by fascial dehiscence mesenteric hematoma pelvic abscess and mucocutaneous separation of colostomy status post exploratory laparotomy colostomy revision drain placement and wound VAC 09/08/1155 further complicated by development of intra-abdominal fluid collection status post right lower quadrant drain placement 11/11/2019 per interventional radiology Dr. Kathlene Cote.                -patient may not yet shower d/t abdominal wounds, drains             -ELOS/Goals: 9/8--family ed went well, team conf today  -Continue CIR 2.  Antithrombotics: -DVT/anticoagulation: Lovenox--can d/c at  discharge, discussed with pt             -antiplatelet therapy: N/A 3. Pain Management: Neurontin 200 mg twice daily, Lidoderm patch as directed, Robaxin 1000 mg every 8 hours, tramadol as needed. Well controlled- changed tylenol to as needed. Abdominal pain and nausea-  PRN zofran. Improved.  9/8: pain is well controlled.  4. Mood: Lexapro 10 mg daily, melatonin 3 mg nightly as needed. Mood is good. Sleeping well  9/6- sleeping well- con't regimen.              -antipsychotic agents: N/A. Lavender aromatherapy  - lexapro. 5. Neuropsych: This patient is capable of making decisions on her own behalf. 6. Skin/Wound Care: continue local care to abdominal wounds  -see JP drains below 7. Fluids/Electrolytes/Nutrition: encourage po with high protein given protein malnutrition  Eating "like a pig" according to patient.   -continue protein supp for low albumin  8.  ID/intra-abdominal abscess.   Cipro 500 mg twice daily ended 8/25  9/6 labs all stable.   -JP's removed 9.  Acute blood loss anemia.  Hgb stable on 8/27. Labs pending 10.  Hypotension.     -  Continue TED stockings  -continue midodrine  -we will discuss weaning these after discharge  -asymptomatic at rest.  11. Hypokalemia: 4.3  8/24    9/6 potassium low normal 3.7  -continue supplement 12. Crohn's disease: continue prednisone 1m daily      LOS: 16 days A FACE TO FACE EVALUATION WAS PERFORMED  KClide DeutscherRaulkar 12/09/2019, 9:13 AM

## 2019-12-09 NOTE — Progress Notes (Signed)
Patient discharged home with husband. All belongings and equipment sent home. Escorted out by NT.

## 2019-12-09 NOTE — Progress Notes (Signed)
Inpatient Rehabilitation Care Coordinator  Discharge Note  The overall goal for the admission was met for:   Discharge location: Yes. D/c to home with 24/7 care from  Husband.   Length of Stay: Yes. 15 days.   Discharge activity level: Yes. CGA   Home/community participation: Yes. Limited.   Services provided included: MD, RD, PT, OT, RN, CM, TR, Pharmacy, Neuropsych and SW  Financial Services: Private Insurance: [employer]  Follow-up services arranged: Pt unable to get [facility] at time of discharge due to insurance and/or staffing. Pt aware SW will continue to follow until able to establish. DME: [facility] for RW.   Comments (or additional information): contact pt # [phone]  Patient/Family verbalized understanding of follow-up arrangements: Yes  Individual responsible for coordination of the follow-up plan: Pt to have assistance with care needs from her husband.   Confirmed correct DME delivered: Rana Snare 12/09/2019    Rana Snare

## 2019-12-09 NOTE — Plan of Care (Signed)
  Problem: RH BOWEL ELIMINATION Goal: RH STG MANAGE BOWEL WITH ASSISTANCE Description: STG Manage Bowel with Assistance. 12/09/2019 0941 by Rodolph Bong, LPN Outcome: Completed/Met 12/09/2019 0940 by Rodolph Bong, LPN Outcome: Progressing   Problem: RH BLADDER ELIMINATION Goal: RH STG MANAGE BLADDER WITH ASSISTANCE Description: STG Manage Bladder With Assistance 12/09/2019 0941 by Rodolph Bong, LPN Outcome: Completed/Met 12/09/2019 0940 by Rodolph Bong, LPN Outcome: Progressing Goal: RH STG MANAGE BLADDER WITH EQUIPMENT WITH ASSISTANCE Description: STG Manage Bladder With Equipment With Assistance 12/09/2019 0941 by Rodolph Bong, LPN Outcome: Completed/Met 12/09/2019 0940 by Rodolph Bong, LPN Outcome: Progressing   Problem: RH SKIN INTEGRITY Goal: RH STG SKIN FREE OF INFECTION/BREAKDOWN 12/09/2019 0941 by Rodolph Bong, LPN Outcome: Completed/Met 12/09/2019 0940 by Rodolph Bong, LPN Outcome: Progressing Goal: RH STG MAINTAIN SKIN INTEGRITY WITH ASSISTANCE Description: STG Maintain Skin Integrity With Assistance. 12/09/2019 0941 by Rodolph Bong, LPN Outcome: Completed/Met 12/09/2019 0940 by Rodolph Bong, LPN Outcome: Progressing Goal: RH STG ABLE TO PERFORM INCISION/WOUND CARE W/ASSISTANCE Description: STG Able To Perform Incision/Wound Care With Assistance. 12/09/2019 0941 by Rodolph Bong, LPN Outcome: Completed/Met 12/09/2019 0940 by Rodolph Bong, LPN Outcome: Progressing   Problem: RH SAFETY Goal: RH STG ADHERE TO SAFETY PRECAUTIONS W/ASSISTANCE/DEVICE Description: STG Adhere to Safety Precautions With Assistance/Device. 12/09/2019 0941 by Rodolph Bong, LPN Outcome: Completed/Met 12/09/2019 0940 by Rodolph Bong, LPN Outcome: Progressing   Problem: RH PAIN MANAGEMENT Goal: RH STG PAIN MANAGED AT OR BELOW PT'S PAIN GOAL 12/09/2019 0941 by Rodolph Bong, LPN Outcome: Completed/Met 12/09/2019 0940 by Rodolph Bong, LPN Outcome: Progressing   Problem: RH KNOWLEDGE  DEFICIT GENERAL Goal: RH STG INCREASE KNOWLEDGE OF SELF CARE AFTER HOSPITALIZATION 12/09/2019 0941 by Rodolph Bong, LPN Outcome: Completed/Met 12/09/2019 0940 by Rodolph Bong, LPN Outcome: Progressing   Problem: Consults Goal: RH GENERAL PATIENT EDUCATION Description: See Patient Education module for education specifics. 12/09/2019 0941 by Rodolph Bong, LPN Outcome: Completed/Met 12/09/2019 0940 by Rodolph Bong, LPN Outcome: Progressing

## 2019-12-09 NOTE — Plan of Care (Signed)
  Problem: RH BOWEL ELIMINATION Goal: RH STG MANAGE BOWEL WITH ASSISTANCE Description: STG Manage Bowel with Assistance. Outcome: Progressing   Problem: RH BLADDER ELIMINATION Goal: RH STG MANAGE BLADDER WITH ASSISTANCE Description: STG Manage Bladder With Assistance Outcome: Progressing Goal: RH STG MANAGE BLADDER WITH EQUIPMENT WITH ASSISTANCE Description: STG Manage Bladder With Equipment With Assistance Outcome: Progressing   Problem: RH SKIN INTEGRITY Goal: RH STG SKIN FREE OF INFECTION/BREAKDOWN Outcome: Progressing Goal: RH STG MAINTAIN SKIN INTEGRITY WITH ASSISTANCE Description: STG Maintain Skin Integrity With Assistance. Outcome: Progressing Goal: RH STG ABLE TO PERFORM INCISION/WOUND CARE W/ASSISTANCE Description: STG Able To Perform Incision/Wound Care With Assistance. Outcome: Progressing   Problem: RH SAFETY Goal: RH STG ADHERE TO SAFETY PRECAUTIONS W/ASSISTANCE/DEVICE Description: STG Adhere to Safety Precautions With Assistance/Device. Outcome: Progressing   Problem: RH PAIN MANAGEMENT Goal: RH STG PAIN MANAGED AT OR BELOW PT'S PAIN GOAL Outcome: Progressing   Problem: RH KNOWLEDGE DEFICIT GENERAL Goal: RH STG INCREASE KNOWLEDGE OF SELF CARE AFTER HOSPITALIZATION Outcome: Progressing   Problem: Consults Goal: RH GENERAL PATIENT EDUCATION Description: See Patient Education module for education specifics. Outcome: Progressing

## 2019-12-09 NOTE — Discharge Instructions (Signed)
Inpatient Rehab Discharge Instructions  Natasha Barrera Discharge date and time: No discharge date for patient encounter.   Activities/Precautions/ Functional Status: Activity: activity as tolerated Diet: soft Wound Care: keep wound clean and dry Functional status:  ___ No restrictions     ___ Walk up steps independently ___ 24/7 supervision/assistance   ___ Walk up steps with assistance ___ Intermittent supervision/assistance  ___ Bathe/dress independently ___ Walk with walker     _x__ Bathe/dress with assistance ___ Walk Independently    ___ Shower independently ___ Walk with assistance    ___ Shower with assistance ___ No alcohol     ___ Return to work/school ________   COMMUNITY REFERRALS UPON DISCHARGE:    Home Health:   PT     OT      RN                  Agency: Phone:    Medical Equipment/Items Ordered: rolling walker                                                 Agency/Supplier: [employer] [phone]   Special Instructions: No driving smoking or alcohol  Routine colostomy care  Damp to dry dressing changes to midline wound change twice daily  Follow-up interventional radiology Dr. Kathlene Cote in regards to JP drain care [phone]   My questions have been answered and I understand these instructions. I will adhere to these goals and the provided educational materials after my discharge from the hospital.  Patient/Caregiver Signature _______________________________ Date __________  Clinician Signature _______________________________________ Date __________  Please bring this form and your medication list with you to all your follow-up doctor's appointments.

## 2019-12-09 NOTE — Progress Notes (Signed)
Patient ID: Natasha Barrera, female   DOB: 06/19/1965, 54 y.o.   MRN: [id number]  SW waiting on follow-up from [facility] if referral was accepted.  *Reports that branch is seeing if they are in network with pt insurance. SW still waiting on updates.   SW met with pt and pt husband in room to provide updates on above, and informed will follow-up once discharged if branch is unable to accept. SW reiterated if unable to obtain a HH, will continue to work on to establish.   SW received updates from [facility] who reported pt is not in their network.    , MSW, LCSWA Office: [phone] Cell: [phone] Fax: [phone] 

## 2019-12-10 ENCOUNTER — Ambulatory Visit: Payer: 59 | Admitting: Nurse Practitioner

## 2019-12-17 ENCOUNTER — Other Ambulatory Visit: Payer: Self-pay | Admitting: Surgery

## 2019-12-17 DIAGNOSIS — K651 Peritoneal abscess: Secondary | ICD-10-CM

## 2019-12-24 ENCOUNTER — Ambulatory Visit
Admission: RE | Admit: 2019-12-24 | Discharge: 2019-12-24 | Disposition: A | Discharge status: Home / Self Care | Payer: 59 | Source: Ambulatory Visit | Attending: Surgery | Admitting: Surgery

## 2019-12-24 DIAGNOSIS — K651 Peritoneal abscess: Secondary | ICD-10-CM

## 2019-12-24 HISTORY — PX: IR RADIOLOGIST EVAL & MGMT: IMG5224

## 2019-12-24 MED ORDER — IOPAMIDOL (ISOVUE-300) INJECTION 61%
100.0000 mL | Freq: Once | INTRAVENOUS | Status: AC | PRN
  Administered 2019-12-24: 100 mL via INTRAVENOUS

## 2019-12-24 NOTE — Progress Notes (Signed)
Referring Physician(s): Dr. Kae Heller  Chief Complaint:  The patient is seen in follow up today s/p colon perforation and drain placement by Dr. Kathlene Cote 11/11/19.  History of present illness:  Natasha Barrera, 54 year old female, has a history of diverticulitis and is s/p sigmoid colectomy in 2013. She presented to the ED in July 2021 with diffuse colitis and a large perforation in the region of the prior rectosigmoid anastomosis. She went to the OR with Dr. Kae Heller 10/24/19 for an exploratory laparotomy with Hartmann's procedure. Her hospital course was complicated by fascial dehiscence, mesenteric hematoma, pelvic abscess and mucocutaneous separation of the colostomy and she returned to the OR with Dr. Georgette Dover 11/04/19. He performed an exploratory laparotomy, colostomy revision, drain placement x1 and wound vac placement. The patient unfortunately later developed an intra-abdominal fluid collection and she was seen by IR 11/11/19 for a RLQ drain placement.   Ms. Gladue had a prolonged hospital recovery but was eventually stabilized and was transferred to in-patient rehab from 11/23/19-12/09/19. She presents today to the [facility] Radiology clinic for outpatient follow-up of the RLQ drain.    She is afebrile, eating and drinking well and denies pain or discomfort around the drain site. She endorses good energy levels. Approximately 10 cc of purulent fluid in the bulb which the patient has not emptied in over two weeks.   Past Medical History:  Diagnosis Date   Diverticul disease small and large intestine, no perforati or abscess    Diverticulitis of colon    Hypertension     Past Surgical History:  Procedure Laterality Date   APPLICATION OF WOUND VAC N/A 11/04/2019   Procedure: APPLICATION OF WOUND VAC;  Surgeon: Donnie Mesa, MD;  Location: [place];  Service: General;  Laterality: N/A;   BIOPSY  10/21/2019   Procedure: BIOPSY;  Surgeon: Irene Shipper, MD;  Location: [facility] ENDOSCOPY;  Service:  Endoscopy;;   COLON SURGERY  12/25/2011   COLONOSCOPY     COLONOSCOPY WITH PROPOFOL N/A 10/21/2019   Procedure: COLONOSCOPY WITH PROPOFOL;  Surgeon: Irene Shipper, MD;  Location: [place];  Service: Endoscopy;  Laterality: N/A;   COLOSTOMY Left 10/24/2019   Procedure: COLOSTOMY;  Surgeon: Clovis Riley, MD;  Location: [place];  Service: General;  Laterality: Left;   ESOPHAGOGASTRODUODENOSCOPY (EGD) WITH PROPOFOL N/A 10/21/2019   Procedure: ESOPHAGOGASTRODUODENOSCOPY (EGD) WITH PROPOFOL;  Surgeon: Irene Shipper, MD;  Location[facility] ENDOSCOPY;  Service: Endoscopy;  Laterality: N/A;  with small bowel BX's   HEMORRHOID SURGERY  2002   LAPAROTOMY N/A 10/24/2019   Procedure: EXPLORATORY LAPAROTOMY;  Surgeon: Clovis Riley, MD;  Location: [place];  Service: General;  Laterality: N/A;   LAPAROTOMY N/A 11/04/2019   Procedure: EXPLORATORY LAPAROTOMY FOR DEHISCENCE;  Surgeon: Donnie Mesa, MD;  Location: [place];  Service: General;  Laterality: N/A;   VENTRAL HERNIA REPAIR N/A 12/05/2018   Procedure: LAPAROSCOPIC VENTRAL HERNIA REPAIR WITH MESH;  Surgeon: Jovita Kussmaul, MD;  Location: [place];  Service: General;  Laterality: N/A;    Allergies: Patient has no known allergies.  Medications: Prior to Admission medications   Medication Sig Start Date End Date Taking? Authorizing Provider  B Complex-C (B-COMPLEX WITH VITAMIN C) tablet Take 1 tablet by mouth daily. 12/08/19   Angiulli, Lavon Paganini, PA-C  Calcium Carb-Cholecalciferol (CALCIUM+D3 PO) Take 1 tablet by mouth daily.    [provider]  Cholecalciferol (VITAMIN D-3) 125 MCG (5000 UT) TABS Take 5,000 Units by mouth daily.    [provider]  escitalopram (LEXAPRO) 10 MG tablet Take 1 tablet (10 mg total) by mouth daily. 12/08/19   Angiulli, Lavon Paganini, PA-C  ferrous sulfate 325 (65 FE) MG tablet Take 1 tablet (325 mg total) by mouth daily with breakfast. 12/08/19   Angiulli, Lavon Paganini, PA-C  gabapentin (NEURONTIN) 100 MG capsule Take  2 capsules (200 mg total) by mouth 2 (two) times daily. 12/08/19 01/07/20  Angiulli, Lavon Paganini, PA-C  loperamide (IMODIUM A-D) 2 MG tablet Take 1 tablet (2 mg total) by mouth 4 (four) times daily as needed for diarrhea or loose stools. 09/20/19   Maudie Mercury, MD  methocarbamol (ROBAXIN) 500 MG tablet Take 2 tablets (1,000 mg total) by mouth every 8 (eight) hours. 12/08/19 01/07/20  Angiulli, Lavon Paganini, PA-C  midodrine (PROAMATINE) 10 MG tablet Take 1 tablet (10 mg total) by mouth 2 (two) times daily with breakfast and lunch. 12/08/19   Angiulli, Lavon Paganini, PA-C  Multiple Vitamin (MULTIVITAMIN WITH MINERALS) TABS tablet Take 1 tablet by mouth daily. Centrum Silver    [provider]  pantoprazole (PROTONIX) 40 MG tablet Take 1 tablet (40 mg total) by mouth daily at 6 (six) AM. 12/08/19   Angiulli, Lavon Paganini, PA-C  potassium chloride SA (KLOR-CON) 20 MEQ tablet Take 1 tablet (20 mEq total) by mouth daily. 12/08/19   Angiulli, Lavon Paganini, PA-C  predniSONE (DELTASONE) 20 MG tablet Take 1 tablet (20 mg total) by mouth daily before breakfast. 12/08/19   Angiulli, Lavon Paganini, PA-C     Family History  Problem Relation Age of Onset   Breast cancer Neg Hx     Social History   Socioeconomic History   Marital status: Married    Spouse name: Not on file   Number of children: Not on file   Years of education: Not on file   Highest education level: Not on file  Occupational History   Not on file  Tobacco Use   Smoking status: Former Smoker   Smokeless tobacco: Never Used  Scientific laboratory technician Use: Never used  Substance and Sexual Activity   Alcohol use: Yes    Comment: occasional   Drug use: No   Sexual activity: Not on file  Other Topics Concern   Not on file  Social History Narrative   Not on file   Social Determinants of Health   Financial Resource Strain:    Difficulty of Paying Living Expenses: Not on file  Food Insecurity:    Worried About [place] in the Last  Year: Not on file   [place] in the Last Year: Not on file  Transportation Needs:    Lack of Transportation (Medical): Not on file   Lack of Transportation (Non-Medical): Not on file  Physical Activity:    Days of Exercise per Week: Not on file   Minutes of Exercise per Session: Not on file  Stress:    Feeling of Stress : Not on file  Social Connections:    Frequency of Communication with Friends and Family: Not on file   Frequency of Social Gatherings with Friends and Family: Not on file   Attends Religious Services: Not on file   Active Member of Clubs or Organizations: Not on file   Attends Archivist Meetings: Not on file   Marital Status: Not on file     Vital Signs: BP 113/65    Pulse 79    Temp 98.1 F (36.7 C)  LMP 12/15/2014    SpO2 100%   Physical Exam Constitutional:      General: She is not in acute distress. Abdominal:     Palpations: Abdomen is soft.     Comments: RLQ drain. Dried drainage around the site. No erythema or tenderness. Approximately 10 ml of purulent fluid in bulb. Midline incision from surgery still healing. Unable to view site as it is covered with gauze/tape. Patient states the incision is still open and it is packed with a little bit of gauze. Colostomy in place.   Musculoskeletal:        General: Normal range of motion.  Skin:    General: Skin is warm and dry.  Neurological:     Mental Status: She is alert and oriented to person, place, and time.     Imaging: No results found.  Labs:  CBC: Recent Labs    11/24/19 0933 11/27/19 0615 12/04/19 1846 12/07/19 0546  WBC 11.3* 7.9 6.7 8.4  HGB 10.2* 8.7* 8.1* 9.5*  HCT 32.2* 28.4* 25.6* 31.5*  PLT 429* 375 502* 649*    COAGS: Recent Labs    10/31/19 1721 11/06/19 0725  INR 1.2 1.1  APTT  --  32    BMP: Recent Labs    11/24/19 0933 11/27/19 0615 12/04/19 1846 12/07/19 0546  NA 131* 136 137 132*  K 4.2 4.0 3.2* 3.7  CL 97* 100 102 97*    CO2 24 28 24 25   GLUCOSE 113* 85 107* 80  BUN 5* 9 11 8   CALCIUM 8.7* 8.7* 8.5* 8.9  CREATININE 0.37* 0.30* 0.48 0.47  GFRNONAA >60 >60 >60 >60  GFRAA >60 >60 >60 >60    LIVER FUNCTION TESTS: Recent Labs    11/10/19 0415 11/19/19 0500 11/24/19 0933 12/07/19 0546  BILITOT 0.6 0.3 0.5 0.3  AST 11* 10* 19 22  ALT 23 11 15 29   ALKPHOS 112 76 104 86  PROT 4.6* 4.8* 5.8* 5.7*  ALBUMIN 1.7* 1.5* 2.1* 2.4*    Assessment:  Intraabdominal fluid collection; RLQ drain placed by IR 11/11/19: CT imaging obtained today showed resolution of the intraabdominal fluid collection and contrast drain injection confirmed this finding. The RLQ drain was removed without difficulty and the site was covered with gauze/tape. The patient has a follow up appointment with Dr. Kae Heller in November and she is encouraged to keep this appointment. The patient does not require any further follow up with [facility] Radiology but she knows to call us if she has any questions or concerns.   Signed: Theresa Duty, NP 12/24/2019, 2:00 PM   Please refer to Dr. Jonee Portela attestation of this note for management and plan.

## 2020-01-27 ENCOUNTER — Encounter: Payer: Self-pay | Admitting: Gastroenterology

## 2020-01-29 ENCOUNTER — Telehealth: Payer: Self-pay | Admitting: Internal Medicine

## 2020-01-29 ENCOUNTER — Encounter: Payer: Self-pay | Admitting: *Deleted

## 2020-01-29 NOTE — Telephone Encounter (Signed)
Technically Dr. Tarri Glenn' patient (I did inpatient procedures). I'm sure that she won't mind, but I'll forward this note, just the same. Thanks

## 2020-01-29 NOTE — Telephone Encounter (Signed)
Hi Dr. Henrene Pastor,  This patient was seen at the hospital. Patient is requesting a transfer of care over to Dr. Havery Moros due to having family members that are his patients and she is hoping to stay with the same provider.   Please advise on scheduling.   Thank you

## 2020-01-29 NOTE — Telephone Encounter (Signed)
Okay with me if it's okay with Dr. Havery Moros.

## 2020-01-29 NOTE — Telephone Encounter (Signed)
If okay with Dr. Tarri Glenn that is okay with me, thanks

## 2020-02-01 NOTE — Telephone Encounter (Signed)
Pt has an appt scheduled on 11/9 with Dr. Havery Moros.

## 2020-02-09 ENCOUNTER — Ambulatory Visit: Payer: 59 | Admitting: Gastroenterology

## 2021-02-17 IMAGING — CT CT ABD-PELV W/ CM
2 of 5 series · 15 of 46 positions shown, 17 images · IV contrast (APPLIED)
Comparison: CT scan 11/04/2019

CLINICAL DATA: Right lower quadrant abdominal pain. Known hematoma.

EXAM:
CT ABDOMEN AND PELVIS WITH CONTRAST
TECHNIQUE: Multidetector CT imaging of the abdomen and pelvis was performed
using the standard protocol following bolus administration of
intravenous contrast.
CONTRAST:  75mL OMNIPAQUE IOHEXOL 300 MG/ML  SOLN

[Series 3: abdomen 5.0 · axial · 0.88mm/px · z∈[+736,+1086]mm · 12 of 81 slices shown, 14 images]
[im 6/81  soft-tissue]
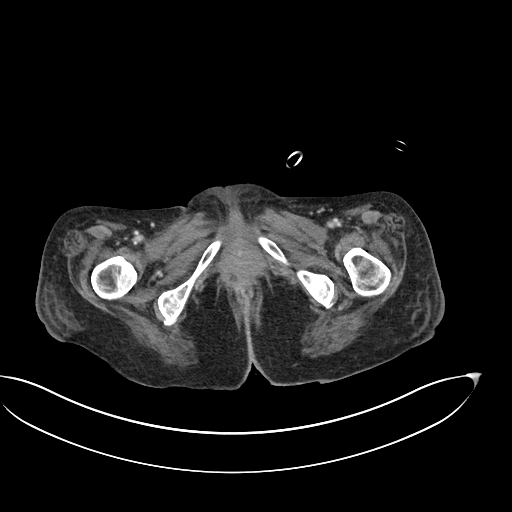
[im 6/81  bone]
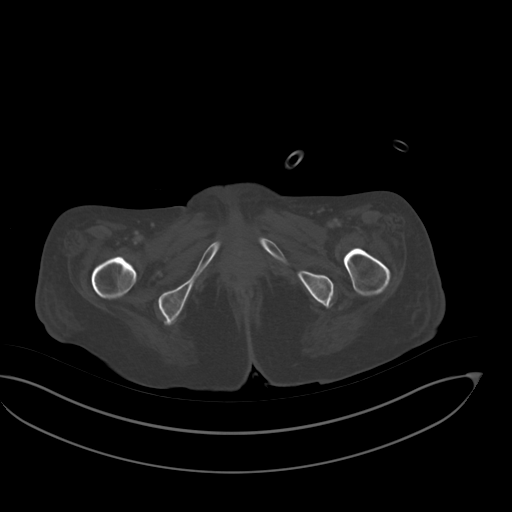
[im 11/81  soft-tissue]
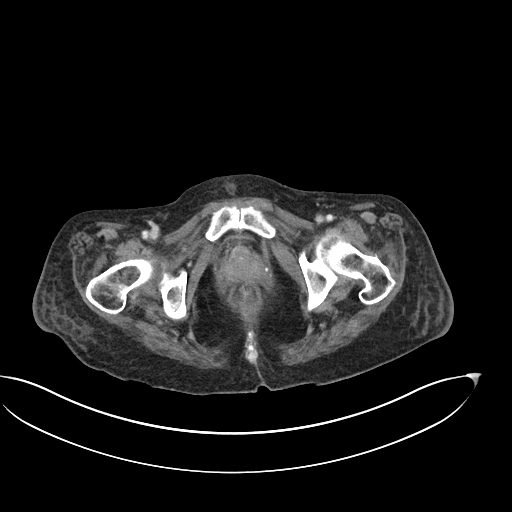
[im 21/81  soft-tissue]
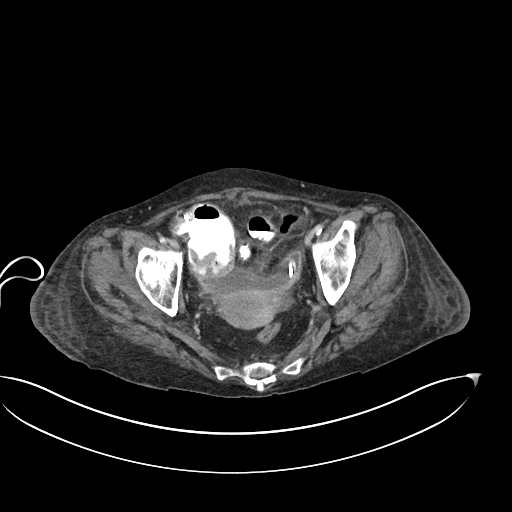
[im 26/81  soft-tissue]
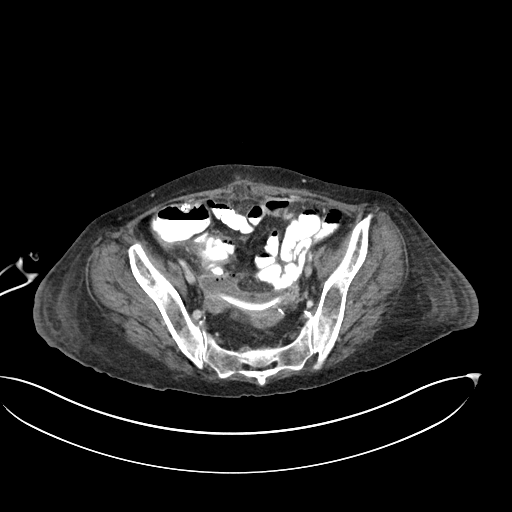
[im 31/81  soft-tissue]
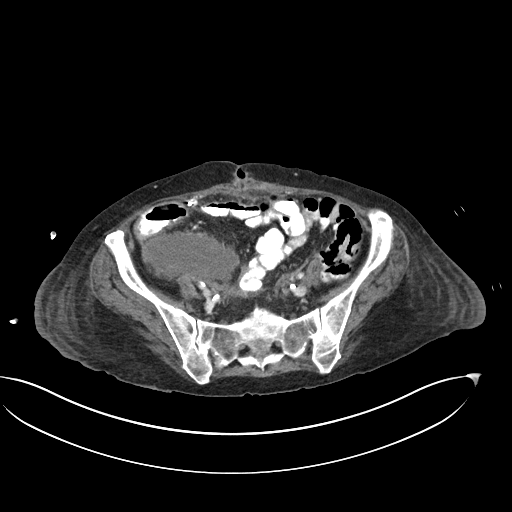
[im 36/81  soft-tissue]
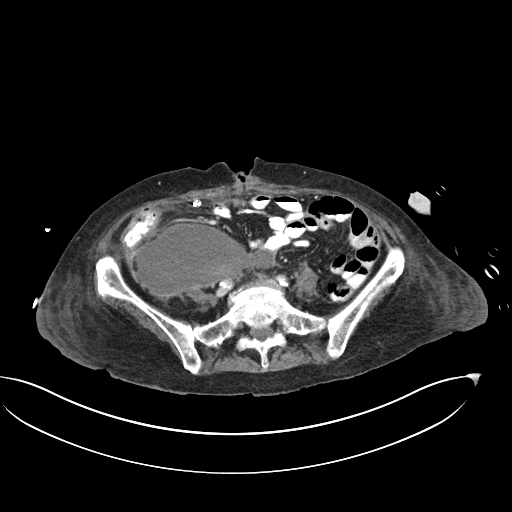
[im 46/81  soft-tissue]
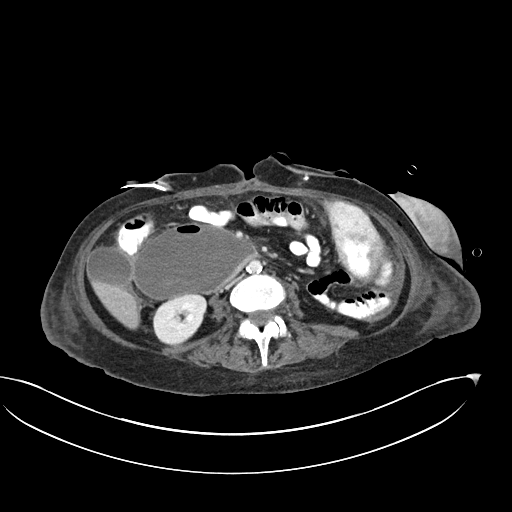
[im 51/81  soft-tissue]
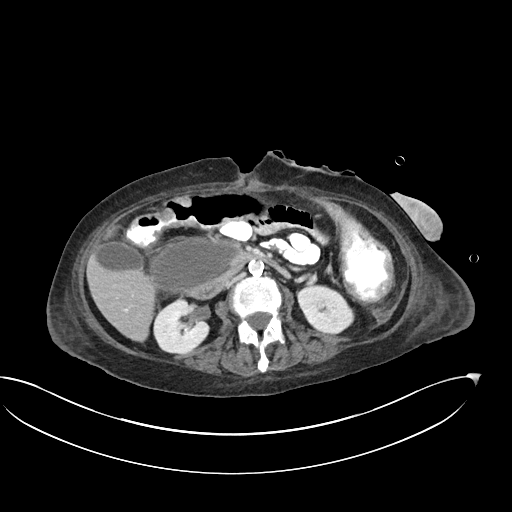
[im 56/81  soft-tissue]
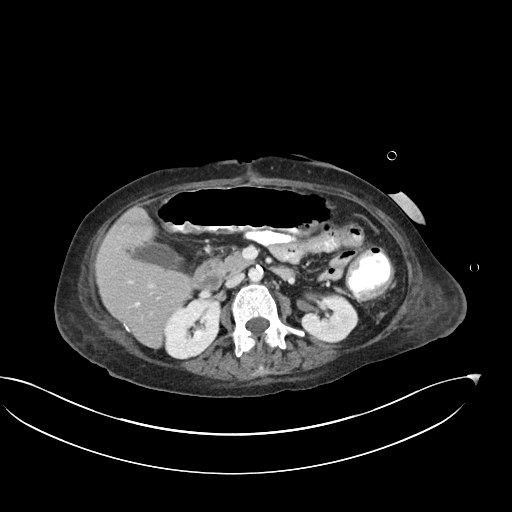
[im 56/81  bone]
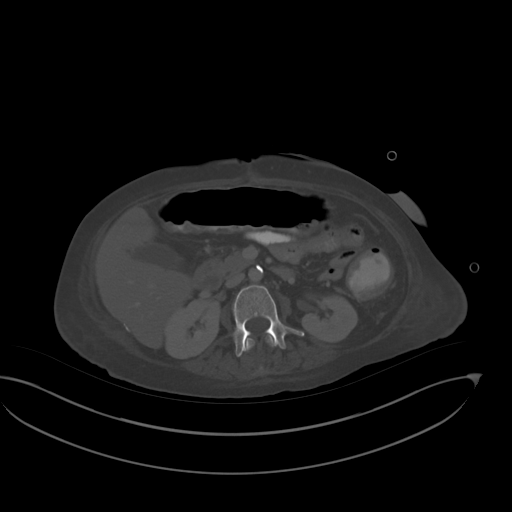
[im 61/81  soft-tissue]
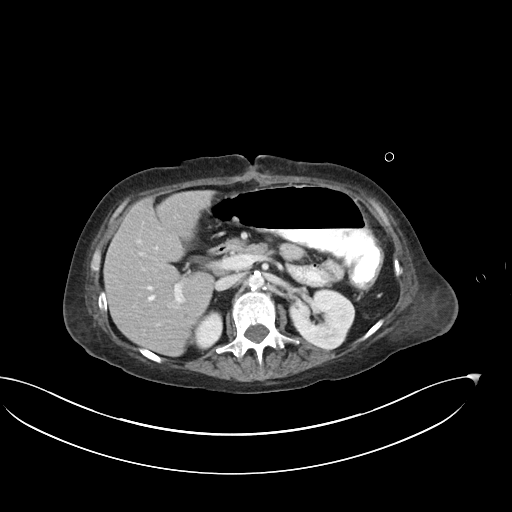
[im 71/81  soft-tissue]
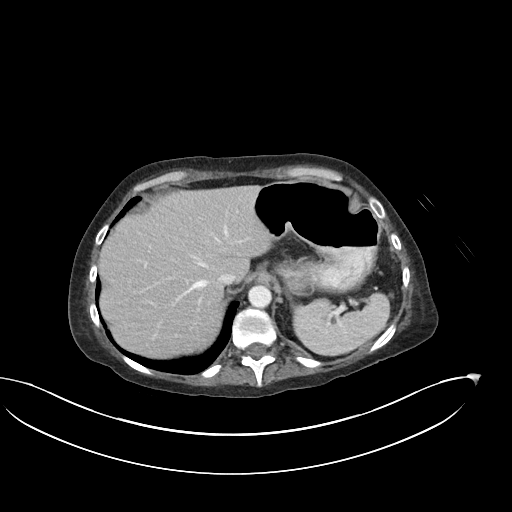
[im 76/81  soft-tissue]
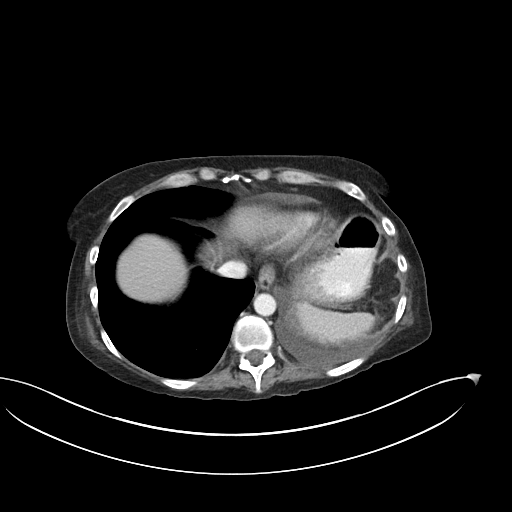

[Series 6: abdomen 3.0 mpr cor · coronal · 0.68mm/px · 3 of 84 slices shown]
[im 28/84  soft-tissue]
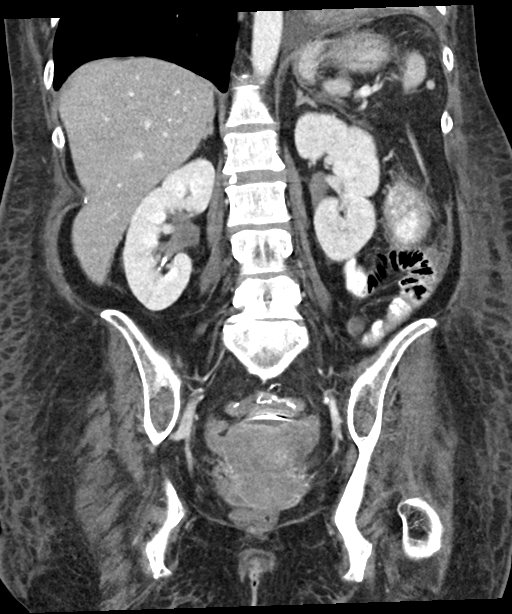
[im 37/84  soft-tissue]
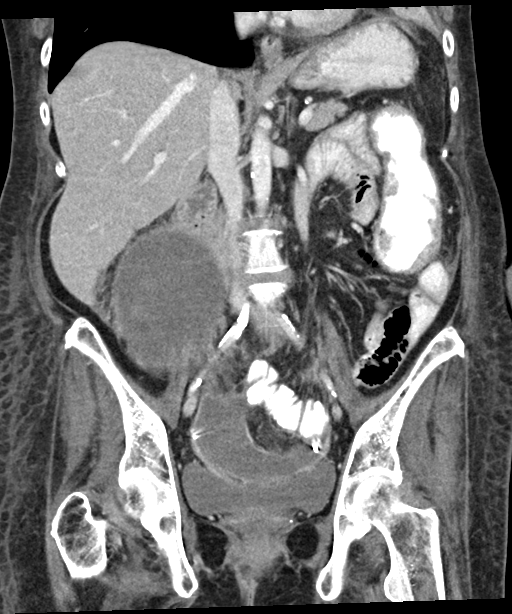
[im 47/84  soft-tissue]
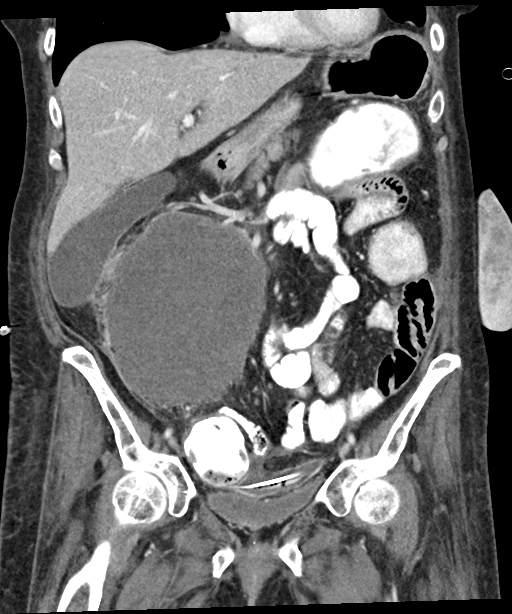

[15 of 46 positions shown; findings below may reference images not displayed]

FINDINGS: Lower chest: Persistent left-sided pleural effusion with overlying
atelectasis. There is also a small amount of pericardial fluid which
is stable. The right lung base is clear.

Hepatobiliary: No hepatic lesions or intrahepatic biliary
dilatation. The gallbladder appears normal. No common bile duct
dilatation.

Pancreas: No mass, inflammation or ductal dilatation.

Spleen: Normal size. No focal lesions.

Adrenals/Urinary Tract: The adrenal glands and kidneys are
unremarkable. The bladder appears normal.

Stomach/Bowel: The stomach, duodenum, small bowel and colon are
unremarkable. No acute inflammatory process, mass lesion or
obstructive findings. Left lower quadrant colostomy without
complicating features. Stable appearing Hartmann's pouch. No leaking
oral contrast is identified.

Vascular/Lymphatic: Stable age advanced vascular calcifications.
Stable small scattered mesenteric and retroperitoneal lymph nodes,
likely reactive.

Reproductive: The uterus and ovaries are unremarkable.

Other: Stable drainage catheter in the pelvis with a small amount of
surrounding fluid and a few dots of air, not unexpected.

The large right-sided abdominal/pelvic hematoma is slightly smaller.
It measures 13 x 11 x 7 cm and previously measured 14.5 x 12 x 8 cm.
Some residual layering hematoma but it appears to be largely
liquified now. It now contains a small amount of gas and there is
also some rim enhancement. Findings worrisome for infected hematoma.
Aspiration/drainage may be indicated.

Musculoskeletal: No significant bony findings.
IMPRESSION: 1. Stable drainage catheter in the pelvis with a small amount of
surrounding fluid and a few dots of air, not unexpected.
2. Slight interval decrease in size of the large right-sided
abdominal/pelvic hematoma. It does now contain a small amount of gas
and there is also some rim enhancement. Findings worrisome for
infected hematoma. Aspiration/drainage may be indicated.
3. Stable left-sided pleural effusion with overlying atelectasis.
4. Stable Hartmann's pouch and left lower quadrant colostomy without
complicating features.
5. Stable age advanced vascular calcifications.

.

Aortic Atherosclerosis ([id number].M).

## 2022-02-26 ENCOUNTER — Encounter: Payer: 59 | Admitting: Internal Medicine

## 2022-06-18 ENCOUNTER — Telehealth: Payer: Self-pay

## 2022-06-18 NOTE — Transitions of Care (Post Inpatient/ED Visit) (Signed)
   06/18/2022  Name: Natasha Barrera MRN: [id number] DOB: [birth year]  Today's TOC FU Call Status: Today's TOC FU Call Status:: Successful TOC FU Call Competed TOC FU Call Complete Date: 06/18/22  Transition Care Management Follow-up Telephone Call Date of Discharge: 06/17/22 Discharge Facility: Other ([place]) Name of Other (Non-[facility]) Discharge Facility: [facility] Type of Discharge: Inpatient Admission Primary Inpatient Discharge Diagnosis:: crohn's  Items Reviewed: Did you receive and understand the discharge instructions provided?: Yes Medications obtained and verified?: Yes (Medications Reviewed) Any new allergies since your discharge?: No Dietary orders reviewed?: Yes Do you have support at home?: Yes People in Home: spouse  Home Care and Equipment/Supplies: [place] Ordered?: NA Any new equipment or medical supplies ordered?: NA  Functional Questionnaire: Do you need assistance with bathing/showering or dressing?: No Do you need assistance with meal preparation?: No Do you need assistance with eating?: No Do you have difficulty maintaining continence: No Do you need assistance with getting out of bed/getting out of a chair/moving?: No Do you have difficulty managing or taking your medications?: No  Folllow up appointments reviewed: PCP Follow-up appointment confirmed?: No (patient transferred) MD Provider Line Number:[phone] Given: No [facility] Follow-up appointment confirmed?: NA Do you need transportation to your follow-up appointment?: No Do you understand care options if your condition(s) worsen?: Yes-patient verbalized understanding    [place], [place] Nurse Health Advisor Direct Dial [phone]

## 2022-06-18 NOTE — Telephone Encounter (Signed)
I am still listed as patient's PCP however I noticed that she established care with [facility] in 2021 and they have been following her since then. Last appointment with me was in July 2021.  Please call patient to verify this and remove me as her PCP if this is accurate.

## 2024-11-02 ENCOUNTER — Ambulatory Visit: Payer: Self-pay | Admitting: Physician Assistant
